# Patient Record
Sex: Female | Born: 1970
Health system: Southern US, Community
[De-identification: ages and names within clinical notes are randomized; demographics above are authoritative.]

## PROBLEM LIST (undated history)

## (undated) DIAGNOSIS — I4819 Other persistent atrial fibrillation: Secondary | ICD-10-CM

## (undated) DIAGNOSIS — Z9889 Other specified postprocedural states: Secondary | ICD-10-CM

## (undated) DIAGNOSIS — I509 Heart failure, unspecified: Secondary | ICD-10-CM

## (undated) DIAGNOSIS — L0390 Cellulitis, unspecified: Secondary | ICD-10-CM

## (undated) DIAGNOSIS — I119 Hypertensive heart disease without heart failure: Secondary | ICD-10-CM

## (undated) DIAGNOSIS — I272 Pulmonary hypertension, unspecified: Secondary | ICD-10-CM

## (undated) DIAGNOSIS — M109 Gout, unspecified: Secondary | ICD-10-CM

## (undated) DIAGNOSIS — I4892 Unspecified atrial flutter: Secondary | ICD-10-CM

## (undated) DIAGNOSIS — N183 Chronic kidney disease, stage 3 unspecified: Secondary | ICD-10-CM

## (undated) DIAGNOSIS — D649 Anemia, unspecified: Secondary | ICD-10-CM

## (undated) DIAGNOSIS — I5032 Chronic diastolic (congestive) heart failure: Secondary | ICD-10-CM

## (undated) DIAGNOSIS — I10 Essential (primary) hypertension: Secondary | ICD-10-CM

## (undated) HISTORY — DX: Heart failure, unspecified: I50.9

## (undated) SURGERY — CARDIOVERSION
Anesthesia: Monitor Anesthesia Care

---

## 2002-12-26 ENCOUNTER — Emergency Department (HOSPITAL_COMMUNITY): Admission: EM | Admit: 2002-12-26 | Discharge: 2002-12-27 | Payer: Self-pay | Admitting: *Deleted

## 2005-01-20 ENCOUNTER — Inpatient Hospital Stay (HOSPITAL_COMMUNITY): Admission: EM | Admit: 2005-01-20 | Discharge: 2005-01-23 | Payer: Self-pay | Admitting: *Deleted

## 2005-01-21 ENCOUNTER — Encounter: Payer: Self-pay | Admitting: Cardiology

## 2007-12-06 ENCOUNTER — Encounter: Payer: Self-pay | Admitting: Orthopedic Surgery

## 2007-12-06 ENCOUNTER — Emergency Department (HOSPITAL_COMMUNITY): Admission: EM | Admit: 2007-12-06 | Discharge: 2007-12-06 | Payer: Self-pay | Admitting: Emergency Medicine

## 2007-12-10 ENCOUNTER — Ambulatory Visit: Payer: Self-pay | Admitting: Orthopedic Surgery

## 2007-12-10 DIAGNOSIS — M7020 Olecranon bursitis, unspecified elbow: Secondary | ICD-10-CM

## 2008-11-19 ENCOUNTER — Emergency Department (HOSPITAL_COMMUNITY): Admission: EM | Admit: 2008-11-19 | Discharge: 2008-11-19 | Payer: Self-pay | Admitting: Emergency Medicine

## 2009-06-06 ENCOUNTER — Ambulatory Visit: Payer: Self-pay | Admitting: Orthopedic Surgery

## 2009-06-06 DIAGNOSIS — M7710 Lateral epicondylitis, unspecified elbow: Secondary | ICD-10-CM | POA: Insufficient documentation

## 2010-01-24 ENCOUNTER — Encounter: Payer: Self-pay | Admitting: Orthopedic Surgery

## 2010-03-30 NOTE — Assessment & Plan Note (Signed)
Summary: NEW PROBLEM LEFT ELBOW PAIN NEEDS XR/MEDICARE/BSF   Visit Type:  new problem  CC:  left elbow pain.  History of Present Illness: 40 years old African American female history of RIGHT elbow problems treated with injection for tendinitis Zentz with LEFT elbow pain, swelling, x1 week.  Seems to have gotten better since she is a been applying ice.  But she did have painful range of motion sharp throbbing burning pain which was a 10 was associated with some numbness and tingling and swelling as stated.  The pain is constant it is worse with motion.  She also took some ibuprofen which seemed to help Xrays today.  Allergies: 1)  ! Penicillin  Past History:  Past Medical History: Last updated: 12/10/2007 htn  Past Surgical History: Last updated: 12/10/2007 NA  Family History: Last updated: 12/10/2007 FH of Cancer:  Family History of Diabetes Family History Coronary Heart Disease female < 94 Hx, family, asthma  Social History: Last updated: 12/10/2007 Patient is single.  LPN  Risk Factors: Caffeine Use: 0 (12/10/2007)  Risk Factors: Smoking Status: never (12/10/2007)  Review of Systems Constitutional:  Denies weight loss, weight gain, fever, chills, and fatigue. Cardiovascular:  Complains of murmurs; denies chest pain, palpitations, and fainting. Respiratory:  Denies short of breath, wheezing, couch, tightness, pain on inspiration, and snoring . Gastrointestinal:  Denies heartburn, nausea, vomiting, diarrhea, constipation, and blood in your stools. Genitourinary:  Denies frequency, urgency, difficulty urinating, painful urination, flank pain, and bleeding in urine. Neurologic:  Complains of numbness and tingling; denies unsteady gait, dizziness, tremors, and seizure. Musculoskeletal:  Complains of joint pain; denies swelling, instability, stiffness, redness, heat, and muscle pain. Endocrine:  Denies excessive thirst, exessive urination, and heat or cold  intolerance. Psychiatric:  Denies nervousness, depression, anxiety, and hallucinations. Skin:  Denies changes in the skin, poor healing, rash, itching, and redness. HEENT:  Denies blurred or double vision, eye pain, redness, and watering. Immunology:  Complains of seasonal allergies; denies sinus problems and allergic to bee stings. Hemoatologic:  Denies easy bleeding and brusing.  Physical Exam  Additional Exam:  Normal appearance, development and grooming.  Good pulse in perfusion to the LEFT upper extremity  There is no lymphadenopathy there.  Skin is intact no rash  She is oriented x3 her mood is pleasant  Her LEFT elbow is tender over the lateral epicondyle and there is some swelling there, there is also some swelling and mild tenderness at the olecranon bursa.  She has decreased range of motion in the elbow with painful flexion 5 loss of extension.  Elbow is stable to medial and lateral stress.  Joint is normal.  There is some tenderness in the forearm as well.  She does not have pain with wrist extension.     Impression & Recommendations:  Problem # 1:  LATERAL EPICONDYLITIS (ICD-726.32) Assessment New  injection LEFT elbow lateral epicondyle The  elbow was prepped with alcohol and anesthetized with ethyl chloride. 40 mg of Depo-Medrol and 5 cc of 1% lidocaine were injected along the lateral epicondyle. No complications. Verbal consent was obtained prior to injection.  Orders: Est. Patient Level IV (11914) Depo- Medrol 40mg  (J1030) Joint Aspirate / Injection, Intermediate (20605) Elbow x-ray, 2 views (78295)  Patient Instructions: 1)  You have received an injection of cortisone today. You may experience increased pain at the injection site. Apply ice pack to the area for 20 minutes every 2 hours and take 2 xtra strength tylenol every 8 hours. This increased  pain will usually resolve in 24 hours. The injection will take effect in 3-10 days.  2)  continue ice until pain  is completely gone  3)  Please schedule a follow-up appointment as needed. 4)  DIAGNOSIS IS TENDONITIS OF THE ELBOW

## 2010-03-30 NOTE — Letter (Signed)
Summary: History form  History form   Imported By: Jacklynn Ganong 06/07/2009 13:15:26  _____________________________________________________________________  External Attachment:    Type:   Image     Comment:   External Document

## 2010-03-30 NOTE — Letter (Signed)
Summary: medical record request   medical record request   Imported By: Jacklynn Ganong 02/08/2010 11:48:05  _____________________________________________________________________  External Attachment:    Type:   Image     Comment:   External Document

## 2010-07-14 NOTE — Discharge Summary (Signed)
NAME:  MARRA, FRAGA NO.:  [id number]   MEDICAL RECORD NO.:  [id number]          PATIENT TYPE:  INP   LOCATION:  3729                         FACILITY:  [facility]   PHYSICIAN:  Madaline Savage, M.D.DATE OF BIRTH:  12/03/1970   DATE OF ADMISSION:  01/21/2005  DATE OF DISCHARGE:  01/23/2005                                 DISCHARGE SUMMARY   Ms.  Desteni Piscopo was transferred from [facility] for further cardiac  evaluation. She is a 40 year old, morbidly obese African-American female who  presented to [facility] secondary to gout.  Her blood pressure was very high  at 263/157.  Heart rate was 157 an in atrial flutter.  She was admitted to  ICU on an IV Cardizem drip.  She had no chest pain, no shortness of breath.  Her troponin was 0.15.  She apparently was changed from Cardizem to  labetalol and then transferred to [facility] for further evaluation.  She  was seen by Dr. [name] who thought she should undergo cardiac  catheterization, thought her PAF was probably secondary to her elevated  blood pressure, and thought her blood pressure was so elevated secondary to  her right great toe pain.  Because of her other cardiac risk factors, it was  decided she should undergo cardiac catheterization.  On January 22, 2005,  she had a cardiac catheterization by Dr.  [name].  She had giant coronaries,  no obstructive disease.  She had a diffuse slow flow.  He decided on medical  therapy.  Her EF was 50-60%.  She did have LVH.  She had a vascular closure  device used.  On January 23, 2005, she was considered stable for discharge  home.   LABORATORY DATA:  Hemoglobin 12, hematocrit 35, platelets 215,200, WBC 8.6.  Sodium was 142, potassium 3.9, BUN 16, and creatinine 1.8.  CK-MB at [facility] showed just one was done, 115/4, with a troponin of 0.1.  Urine  pregnancy was negative.  Glycosylated hemoglobin was 5.6.  Her chest x-ray  showed the heart was prominent, no active  disease.   DISCHARGE MEDICATIONS:  1.  Labetalol 200 mg two times a day.  2.  Benicar 40 mg one time per day.  3.  Aspirin 81 mg one time per day.  4.  Lasix 40 mg one time per day.  5.  K-Dur 20 mEq one time per day.   ACTIVITY:  She is to do not strenuous activity x5 days, no sitting in a tube  of water for seven days.  She can shower the day after the discharge.   FOLLOW UP:  She will need to be seen by Dr. [name] in 2-3 weeks.  She should  have her blood drawn before she sees Dr. [name].  It should be a fasting  lipid profile.   DISCHARGE DIAGNOSES:  1.  Hypertension.  2.  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  3.  Normal coronary arteries by catheterization.  4.  Ejection fraction of 50-60%.  5.  Premature family history  of heart disease.  6.  Morbid obesity.  7.  Gout.      Lezlie Octave, N.P.    ______________________________  Madaline Savage, M.D.    BB/MEDQ  D:  03/11/2005  T:  03/12/2005  Job:  [id number]   cc:   Corrie Mckusick, M.D.  Fax: [phone]

## 2010-07-14 NOTE — H&P (Signed)
[name], [name]               ACCOUNT NO.:  [id number]   MEDICAL RECORD NO.:  [id number]          PATIENT TYPE:  INP   LOCATION:  IC09                          FACILITY:  [facility]   PHYSICIAN:  Corrie Mckusick, M.D.  DATE OF BIRTH:  [birth year]   DATE OF ADMISSION:  01/19/2005  DATE OF DISCHARGE:  LH                                HISTORY & PHYSICAL   ADMISSION DIAGNOSES:  1.  New-onset atrial fibrillation.  2.  Significant hypertension.   HISTORY OF PRESENT ILLNESS:  A 40 year old female with just a history of  hypertension and gout who was out shopping all day and came into the  emergency department because her toe hurt.  She came in with no other  symptomatology from a cardiovascular/respiratory standpoint whatsoever.  When she came into the emergency department, she was found to have blood  pressures with peak of 263/157.  She was also found to have a pulse in the  150s and in atrial flutter.  She was absolutely asymptomatic with her  systolics in the 200s as well as her diastolics in the 150s-170s.  She was  given Cardizem bolus which actually improved the heart rate as well as the  blood pressure quite nicely.  Dr. Mosetta Putt talked to me about sending the  patient home and I decided that she needed to come into the intensive care  unit on a Cardizem drip to get blood pressure controlled as well as to have  a proper rule out protocol.  She again had no symptomatology with this, no  associated chest pain, shortness of breath, nausea, vomiting or diaphoresis.   As far as I can tell, she has been compliant with her medications.  She has  been on various antihypertensives in the past, Norvasc working the best, but  did cause edema in the feet.  She has also been on Verapamil in the past.  She has been on Benicar 40 mg now for what she said for about 6 months and  she thought she was well-controlled, although she has not been checking her  blood pressure at home.  Review of systems is  otherwise negative.   PAST MEDICAL HISTORY:  1.  Hypertension.  2.  Gout.   PAST SURGICAL HISTORY:  None.   MEDICATIONS:  1.  Benicar 40 mg daily.  2.  Lasix 40 mg p.o. q.a.m. p.r.n.   ALLERGIES:  PENICILLIN.   SOCIAL HISTORY:  Lives with various family members.  Does not drink or  smoke.  Denies any illicit drug use whatsoever.   FAMILY HISTORY:  Significant for hypertension.  No other significant family  history.  No early heart disease in the family.   PHYSICAL EXAMINATION:  VITAL SIGNS:  When I saw the patient, BP 170/90,  heart rate 90s, respirations 20, O2 saturations 99% on room air.  GENERAL:  When I saw the patient, she was pleasant, talkative, sitting up  and eating fruit loops and joking.  HEENT:  Nasopharynx and oropharynx are clear with moist mucous membranes.  NECK:  Supple with no lymphadenopathy and thick, but no  JVD.  CHEST:  Clear to auscultation bilaterally.  CARDIAC:  Irregularly, irregular rhythm.  There is a normal S1, S2.  No  murmur.  ABDOMEN:  Soft, nontender, nondistended.  EXTREMITIES:  No edema.  NEUROLOGIC:  Pupils equal round and reactive to light.  Extraocular  movements intact.  Cranial nerves 2-12 grossly intact.  Completely nonfocal  exam with strength 5/5 throughout, sensation intact.  Reflexes +2  throughout.   LABORATORY DATA AND X-RAY FINDINGS:  Point of care makers reportedly  negative in the ER.  Drug screen positive for opiates only.  Urinalysis  clear.  Sodium 131, potassium 3.1, chloride 103, bicarb 28, glucose 122, BUN  21, creatinine 1.5.  CBC unremarkable.   ASSESSMENT:  A 40 year old with new-onset atrial flutter and dramatic  hypertension.   PLAN:  1.  Admit to the ICU on Cardizem drip to control rate as well as blood      pressure.  2.  Will most likely restart calcium channel blocker in addition to her ARB.  3.  Rule out protocol, CPK-MB, troponin q.8h. x3.  4.  Will consult cardiology for further opinion to discuss  whether we need      to heparinize.  5.  Echocardiogram.  6.  Will check renal artery stenosis.  7.  Will continue to follow closely.  No CT scan of the head was done as she      is completely neurologically intact and is asymptomatic from that      standpoint.      Corrie Mckusick, M.D.  Electronically Signed     JCG/MEDQ  D:  01/20/2005  T:  01/20/2005  Job:  [id number]

## 2010-07-14 NOTE — Cardiovascular Report (Signed)
[name], [name] NO.:  [id number]   MEDICAL RECORD NO.:  [id number]          PATIENT TYPE:  INP   LOCATION:  3729                         FACILITY:  [facility]   PHYSICIAN:  Madaline Savage, M.D.DATE OF BIRTH:  [birth year]   DATE OF PROCEDURE:  01/22/2005  DATE OF DISCHARGE:                              CARDIAC CATHETERIZATION   PROCEDURES PERFORMED:  1.  Selective coronary angiography by Judkins technique.  2.  Retrograde left heart catheterization.  3.  Left ventricular angiography.   COMPLICATIONS:  None.   ENTRY SITE:  Right femoral.   DYE USED:  Omnipaque.   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 She was admitted to  the hospital and at some point and time during the hospitalization was found  to be in atrial flutter transiently. It was her attending physician Dr.  Sharyon Medicus concern that she may have cardiac disease based on her high blood  pressure, her morbid obesity and her family history with a mother who has  already undergone coronary artery bypass grafting. The patient was  transferred to [facility] this past weekend for further evaluation  and today we are performing cardiac catheterization electively.   RESULTS:  Left ventricular pressure was 175/20 with end-diastolic pressure  40. Central aortic pressure 175/106, mean of 135. No aortic valve gradient  by pullback technique.   ANGIOGRAPHIC RESULTS:  The patient's left main coronary artery was  relatively short and the best way to describe her coronary arteries is that  they are giant arteries. I would estimate their diameter to be somewhere  between 5 to 10 millimeters in diameter. The left main coronary artery is  short. It is smooth and it is  normal.   The LAD courses to the cardiac apex, wraps around the apex and has a 30%  eccentric lesion over the course of approximately 15 mm there. The diagonal  branch arising near the septal perforator branch appears normal except for  large diameter and slow sluggish flow.   Left circumflex coronary artery gives rise to two obtuse marginal branches  and terminates as a posterolateral branch which is tortuous. There is one  major atrial circumflex branch seen. The circumflex vessels show diffuse  irregularity but no high-grade obstructive lesions.   Right coronary artery contains diffuse luminal irregularities but no  obstructive lesions. It terminates as a trifurcation into a posterior  descending which is small and two posterolateral branches. There is some  stenosis at the origin of a pulmonary conus branch approximately 50% at the  ostium of this vessel as it comes off the RCA.   The left ventricular angiogram shows the ventricle to be dilated, mildly  hypokinesia in the anterolateral wall. I would estimate ejection fraction of  50%. There is marked apical hypertrophy of the ventricle.   Abdominal aortography was done due  to the patient's marked hypertension.  Right renal artery was single and normal. Left renal artery was a dual  arterial system; both of which were normal. There was no abdominal aorta  pathology.   AngioSeal was performed on the right femoral artery with good results and  the patient was transferred from the cath lab to the holding area.   FINAL DIAGNOSES:  1.  Angiographically patent coronary artery with sluggish flow and marked      ectasia due to left ventricular hypertrophy.  2.  Normal renal arteries.  3.  Well-preserved left ventricular systolic function, ejection fraction 50-      60%.   PLAN:  Continued therapy of the patient's systemic hypertension and we will  continue to encourage weight loss as part of the patient's therapy. Current  height is  511. Current weight is 369 pounds.           ______________________________  Madaline Savage, M.D.     WHG/MEDQ  D:  01/22/2005  T:  01/23/2005  Job:  [id number]   cc:   Madelin Rear. Sherwood Gambler, MD  Fax: [phone]   [facility] Medical Records   [facility] Catheter Lab

## 2010-12-12 ENCOUNTER — Encounter: Payer: Self-pay | Admitting: Orthopedic Surgery

## 2010-12-12 ENCOUNTER — Ambulatory Visit (INDEPENDENT_AMBULATORY_CARE_PROVIDER_SITE_OTHER): Payer: Medicare Other | Admitting: Orthopedic Surgery

## 2010-12-12 VITALS — BP 230/130 | Ht 69.0 in | Wt 384.0 lb

## 2010-12-12 DIAGNOSIS — M25519 Pain in unspecified shoulder: Secondary | ICD-10-CM

## 2010-12-12 DIAGNOSIS — M10029 Idiopathic gout, unspecified elbow: Secondary | ICD-10-CM

## 2010-12-12 DIAGNOSIS — M6530 Trigger finger, unspecified finger: Secondary | ICD-10-CM

## 2010-12-12 NOTE — Patient Instructions (Signed)
You have received a steroid shot. 15% of patients experience increased pain at the injection site with in the next 24 hours. This is best treated with ice and tylenol extra strength 2 tabs every 8 hours. If you are still having pain please call the office.   You have been scheduled for surgery  Please Go to your preoperative appointment and bring the folder that was given to you today  Please stop all blood thinners ibuprofen Naprosyn aspirin Plavix Coumadin   You have been scheduled for surgery.  All surgeries carry some risk.  Remember you always have the option of continued nonsurgical treatment. However in this situation the risks vs. the benefits favor surgery as the best treatment option. The risks of the surgery includes the following but is not limited to bleeding, infection, pulmonary embolus, death from anesthesia, nerve injury vascular injury or need for further surgery, continued pain.  Specific to this procedure the following risks and complications are rare but possible Infection and recurrence

## 2010-12-12 NOTE — Progress Notes (Signed)
Chief complaint #1 RIGHT elbow mass and pain #2 RIGHT shoulder pain resolved spontaneously #3 RIGHT index finger catching and locking   This is a 40 year old female with history of gout presents with bilateral masses over her elbows which are painful, a RIGHT index finger which is catching, and RIGHT shoulder pain which has now gone away by itself  She complains of catching and locking and pain in the A1 pulley site of the RIGHT index finger and complains of pain in the RIGHT elbow constant nonradiating localized over the olecranon with a large mass which is been present for years and appears to be getting bigger.  The LEFT one hurts only occasionally     Examination  General appearance she is obviously obese.  Grooming and hygiene are normal  Radial and ulnar pulses normal color normal  Lymph nodes negative epitrochlear and axilla  Skin warm dry and intact no rash  Sensation normal reflexes normal.  The patient is awake and alert and oriented x 3  Ambulation is normal  RIGHT shoulder full range of motion normal strength no instability no tenderness  RIGHT index finger tenderness over the A1 pulley catching and locking on range of motion which was normal.  Flexion power normal.  Finger joints stable.  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  Strength instability of elbow normal.  The area is tender  Separate x-ray report [facility] lateral RIGHT elbow.  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  Verbal consent  Time out.  Alcohol prep.  Ethyl chloride anesthesia.  Medication: Depo-Medrol 40 mg, lidocaine 1% 3  cc  After prep with alcohol and anesthesia with ethyl chloride one to one injection of above medication was placed at the A1 pulley.

## 2010-12-18 ENCOUNTER — Encounter (HOSPITAL_COMMUNITY): Admission: RE | Admit: 2010-12-18 | Payer: Medicare Other | Source: Ambulatory Visit

## 2010-12-18 ENCOUNTER — Telehealth: Payer: Self-pay | Admitting: Orthopedic Surgery

## 2010-12-18 NOTE — Telephone Encounter (Signed)
[name] (10/07/2070) wants to reschedule her surgery that is scheduled  For 12/22/10. Her Phone # [phone]

## 2010-12-19 ENCOUNTER — Telehealth: Payer: Self-pay | Admitting: Orthopedic Surgery

## 2010-12-19 NOTE — Telephone Encounter (Signed)
Patient is asking if Dr. Romeo Apple can phone in prescription for pain, swelling until she has her surgery. Per previous phone note, she is needing to re-schedule her 12/22/10 surgery to possibly 01/05/11.  Her pharmacy is [facility] in [place].  Her ph# is [phone].

## 2010-12-19 NOTE — Telephone Encounter (Signed)
[name]  Called back and would like to reschedule her surgery for Friday 01/05/11 if possible. Will she need another appointment here?  Please advise

## 2010-12-22 ENCOUNTER — Ambulatory Visit (HOSPITAL_COMMUNITY): Admission: RE | Admit: 2010-12-22 | Payer: Medicare Other | Source: Ambulatory Visit | Admitting: Orthopedic Surgery

## 2010-12-22 ENCOUNTER — Encounter (HOSPITAL_COMMUNITY): Admission: RE | Payer: Self-pay | Source: Ambulatory Visit

## 2010-12-22 SURGERY — EXCISION MASS
Anesthesia: General | Laterality: Right

## 2010-12-25 ENCOUNTER — Ambulatory Visit: Payer: Medicare Other | Admitting: Orthopedic Surgery

## 2010-12-25 ENCOUNTER — Other Ambulatory Visit: Payer: Self-pay | Admitting: Orthopedic Surgery

## 2010-12-25 DIAGNOSIS — M25529 Pain in unspecified elbow: Secondary | ICD-10-CM

## 2010-12-25 MED ORDER — IBUPROFEN 800 MG PO TABS: 800.0000 mg | ORAL_TABLET | Freq: Three times a day (TID) | ORAL | Status: AC | PRN

## 2011-12-10 ENCOUNTER — Encounter (HOSPITAL_COMMUNITY): Payer: Self-pay

## 2011-12-10 ENCOUNTER — Emergency Department (HOSPITAL_COMMUNITY): Payer: Medicare Other

## 2011-12-10 ENCOUNTER — Inpatient Hospital Stay (HOSPITAL_COMMUNITY)
Admission: EM | Admit: 2011-12-10 | Discharge: 2012-01-04 | DRG: 287 | Disposition: A | Discharge status: Other Acute Inpatient Hospital | Payer: Medicare Other | Attending: Internal Medicine | Admitting: Internal Medicine

## 2011-12-10 DIAGNOSIS — R0602 Shortness of breath: Secondary | ICD-10-CM | POA: Diagnosis not present

## 2011-12-10 DIAGNOSIS — M109 Gout, unspecified: Secondary | ICD-10-CM | POA: Diagnosis not present

## 2011-12-10 DIAGNOSIS — M7020 Olecranon bursitis, unspecified elbow: Secondary | ICD-10-CM

## 2011-12-10 DIAGNOSIS — M10029 Idiopathic gout, unspecified elbow: Secondary | ICD-10-CM

## 2011-12-10 DIAGNOSIS — I319 Disease of pericardium, unspecified: Secondary | ICD-10-CM | POA: Diagnosis present

## 2011-12-10 DIAGNOSIS — E876 Hypokalemia: Secondary | ICD-10-CM | POA: Diagnosis present

## 2011-12-10 DIAGNOSIS — Z9119 Patient's noncompliance with other medical treatment and regimen: Secondary | ICD-10-CM | POA: Diagnosis not present

## 2011-12-10 DIAGNOSIS — E8809 Other disorders of plasma-protein metabolism, not elsewhere classified: Secondary | ICD-10-CM | POA: Diagnosis present

## 2011-12-10 DIAGNOSIS — M25519 Pain in unspecified shoulder: Secondary | ICD-10-CM

## 2011-12-10 DIAGNOSIS — R509 Fever, unspecified: Secondary | ICD-10-CM | POA: Diagnosis not present

## 2011-12-10 DIAGNOSIS — M7710 Lateral epicondylitis, unspecified elbow: Secondary | ICD-10-CM

## 2011-12-10 DIAGNOSIS — I4891 Unspecified atrial fibrillation: Secondary | ICD-10-CM | POA: Diagnosis present

## 2011-12-10 DIAGNOSIS — E859 Amyloidosis, unspecified: Secondary | ICD-10-CM | POA: Diagnosis not present

## 2011-12-10 DIAGNOSIS — D649 Anemia, unspecified: Secondary | ICD-10-CM | POA: Diagnosis not present

## 2011-12-10 DIAGNOSIS — M7989 Other specified soft tissue disorders: Secondary | ICD-10-CM | POA: Diagnosis not present

## 2011-12-10 DIAGNOSIS — Z7982 Long term (current) use of aspirin: Secondary | ICD-10-CM

## 2011-12-10 DIAGNOSIS — R7989 Other specified abnormal findings of blood chemistry: Secondary | ICD-10-CM

## 2011-12-10 DIAGNOSIS — R601 Generalized edema: Secondary | ICD-10-CM | POA: Diagnosis present

## 2011-12-10 DIAGNOSIS — I10 Essential (primary) hypertension: Secondary | ICD-10-CM | POA: Diagnosis present

## 2011-12-10 DIAGNOSIS — R609 Edema, unspecified: Secondary | ICD-10-CM | POA: Diagnosis present

## 2011-12-10 DIAGNOSIS — M25529 Pain in unspecified elbow: Secondary | ICD-10-CM

## 2011-12-10 DIAGNOSIS — I279 Pulmonary heart disease, unspecified: Secondary | ICD-10-CM | POA: Diagnosis present

## 2011-12-10 DIAGNOSIS — Z8673 Personal history of transient ischemic attack (TIA), and cerebral infarction without residual deficits: Secondary | ICD-10-CM

## 2011-12-10 DIAGNOSIS — D72829 Elevated white blood cell count, unspecified: Secondary | ICD-10-CM | POA: Diagnosis not present

## 2011-12-10 DIAGNOSIS — I509 Heart failure, unspecified: Secondary | ICD-10-CM | POA: Diagnosis not present

## 2011-12-10 DIAGNOSIS — L03119 Cellulitis of unspecified part of limb: Secondary | ICD-10-CM | POA: Diagnosis present

## 2011-12-10 DIAGNOSIS — R799 Abnormal finding of blood chemistry, unspecified: Secondary | ICD-10-CM | POA: Diagnosis not present

## 2011-12-10 DIAGNOSIS — N179 Acute kidney failure, unspecified: Secondary | ICD-10-CM | POA: Diagnosis not present

## 2011-12-10 DIAGNOSIS — R17 Unspecified jaundice: Secondary | ICD-10-CM | POA: Diagnosis not present

## 2011-12-10 DIAGNOSIS — Z6841 Body Mass Index (BMI) 40.0 and over, adult: Secondary | ICD-10-CM

## 2011-12-10 DIAGNOSIS — Z23 Encounter for immunization: Secondary | ICD-10-CM | POA: Diagnosis not present

## 2011-12-10 DIAGNOSIS — I872 Venous insufficiency (chronic) (peripheral): Secondary | ICD-10-CM | POA: Diagnosis present

## 2011-12-10 DIAGNOSIS — Z09 Encounter for follow-up examination after completed treatment for conditions other than malignant neoplasm: Secondary | ICD-10-CM | POA: Diagnosis not present

## 2011-12-10 DIAGNOSIS — M6530 Trigger finger, unspecified finger: Secondary | ICD-10-CM

## 2011-12-10 DIAGNOSIS — Z86718 Personal history of other venous thrombosis and embolism: Secondary | ICD-10-CM

## 2011-12-10 DIAGNOSIS — I517 Cardiomegaly: Secondary | ICD-10-CM

## 2011-12-10 DIAGNOSIS — D7589 Other specified diseases of blood and blood-forming organs: Secondary | ICD-10-CM | POA: Diagnosis not present

## 2011-12-10 DIAGNOSIS — D235 Other benign neoplasm of skin of trunk: Secondary | ICD-10-CM | POA: Diagnosis not present

## 2011-12-10 DIAGNOSIS — L02419 Cutaneous abscess of limb, unspecified: Secondary | ICD-10-CM | POA: Diagnosis present

## 2011-12-10 DIAGNOSIS — I428 Other cardiomyopathies: Secondary | ICD-10-CM | POA: Diagnosis present

## 2011-12-10 DIAGNOSIS — I2789 Other specified pulmonary heart diseases: Secondary | ICD-10-CM | POA: Diagnosis not present

## 2011-12-10 DIAGNOSIS — I5033 Acute on chronic diastolic (congestive) heart failure: Principal | ICD-10-CM

## 2011-12-10 DIAGNOSIS — N183 Chronic kidney disease, stage 3 unspecified: Secondary | ICD-10-CM | POA: Diagnosis present

## 2011-12-10 DIAGNOSIS — Z91199 Patient's noncompliance with other medical treatment and regimen due to unspecified reason: Secondary | ICD-10-CM

## 2011-12-10 DIAGNOSIS — R5381 Other malaise: Secondary | ICD-10-CM | POA: Diagnosis present

## 2011-12-10 DIAGNOSIS — I129 Hypertensive chronic kidney disease with stage 1 through stage 4 chronic kidney disease, or unspecified chronic kidney disease: Secondary | ICD-10-CM | POA: Diagnosis present

## 2011-12-10 DIAGNOSIS — R079 Chest pain, unspecified: Secondary | ICD-10-CM | POA: Diagnosis not present

## 2011-12-10 DIAGNOSIS — R160 Hepatomegaly, not elsewhere classified: Secondary | ICD-10-CM | POA: Diagnosis not present

## 2011-12-10 DIAGNOSIS — I50810 Right heart failure, unspecified: Secondary | ICD-10-CM

## 2011-12-10 DIAGNOSIS — I4892 Unspecified atrial flutter: Secondary | ICD-10-CM | POA: Diagnosis not present

## 2011-12-10 DIAGNOSIS — R161 Splenomegaly, not elsewhere classified: Secondary | ICD-10-CM | POA: Diagnosis not present

## 2011-12-10 DIAGNOSIS — I429 Cardiomyopathy, unspecified: Secondary | ICD-10-CM

## 2011-12-10 DIAGNOSIS — I50811 Acute right heart failure: Secondary | ICD-10-CM

## 2011-12-10 DIAGNOSIS — I5023 Acute on chronic systolic (congestive) heart failure: Secondary | ICD-10-CM | POA: Diagnosis not present

## 2011-12-10 DIAGNOSIS — M79609 Pain in unspecified limb: Secondary | ICD-10-CM | POA: Diagnosis not present

## 2011-12-10 DIAGNOSIS — I272 Pulmonary hypertension, unspecified: Secondary | ICD-10-CM

## 2011-12-10 DIAGNOSIS — D696 Thrombocytopenia, unspecified: Secondary | ICD-10-CM | POA: Diagnosis not present

## 2011-12-10 DIAGNOSIS — L03116 Cellulitis of left lower limb: Secondary | ICD-10-CM | POA: Diagnosis present

## 2011-12-10 HISTORY — DX: Chronic kidney disease, stage 3 (moderate): N18.3

## 2011-12-10 HISTORY — DX: Gout, unspecified: M10.9

## 2011-12-10 HISTORY — DX: Unspecified atrial flutter: I48.92

## 2011-12-10 HISTORY — DX: Essential (primary) hypertension: I10

## 2011-12-10 HISTORY — DX: Other specified postprocedural states: Z98.890

## 2011-12-10 HISTORY — DX: Hypertensive heart disease without heart failure: I11.9

## 2011-12-10 HISTORY — DX: Chronic kidney disease, stage 3 unspecified: N18.30

## 2011-12-10 HISTORY — DX: Morbid (severe) obesity due to excess calories: E66.01

## 2011-12-10 LAB — CBC WITH DIFFERENTIAL/PLATELET
Basophils Absolute: 0.1 10*3/uL (ref 0.0–0.1)
Basophils Relative: 1 % (ref 0–1)
Eosinophils Absolute: 0.3 10*3/uL (ref 0.0–0.7)
Eosinophils Relative: 3 % (ref 0–5)
HCT: 35.1 % — ABNORMAL LOW (ref 36.0–46.0)
Hemoglobin: 11.3 g/dL — ABNORMAL LOW (ref 12.0–15.0)
Lymphocytes Relative: 13 % (ref 12–46)
Lymphs Abs: 1.5 10*3/uL (ref 0.7–4.0)
MCH: 29.4 pg (ref 26.0–34.0)
MCHC: 32.2 g/dL (ref 30.0–36.0)
MCV: 91.2 fL (ref 78.0–100.0)
Monocytes Absolute: 0.6 10*3/uL (ref 0.1–1.0)
Monocytes Relative: 6 % (ref 3–12)
Neutro Abs: 7.9 10*3/uL — ABNORMAL HIGH (ref 1.7–7.7)
Neutrophils Relative %: 77 % (ref 43–77)
Platelets: 298 10*3/uL (ref 150–400)
RBC: 3.85 MIL/uL — ABNORMAL LOW (ref 3.87–5.11)
RDW: 16.4 % — ABNORMAL HIGH (ref 11.5–15.5)
WBC: 10.3 10*3/uL (ref 4.0–10.5)

## 2011-12-10 LAB — COMPREHENSIVE METABOLIC PANEL
ALT: 6 U/L (ref 0–35)
AST: 16 U/L (ref 0–37)
Albumin: 3.3 g/dL — ABNORMAL LOW (ref 3.5–5.2)
Alkaline Phosphatase: 114 U/L (ref 39–117)
BUN: 20 mg/dL (ref 6–23)
CO2: 22 mEq/L (ref 19–32)
Calcium: 9.5 mg/dL (ref 8.4–10.5)
Chloride: 101 mEq/L (ref 96–112)
Creatinine, Ser: 1.48 mg/dL — ABNORMAL HIGH (ref 0.50–1.10)
GFR calc Af Amer: 51 mL/min — ABNORMAL LOW (ref >90)
GFR calc non Af Amer: 44 mL/min — ABNORMAL LOW (ref >90)
Glucose, Bld: 92 mg/dL (ref 70–99)
Potassium: 3.7 mEq/L (ref 3.5–5.1)
Sodium: 138 mEq/L (ref 135–145)
Total Bilirubin: 2.5 mg/dL — ABNORMAL HIGH (ref 0.3–1.2)
Total Protein: 8.7 g/dL — ABNORMAL HIGH (ref 6.0–8.3)

## 2011-12-10 LAB — TROPONIN I: Troponin I: <0.3 ng/mL (ref <0.30)

## 2011-12-10 MED ORDER — LABETALOL HCL 5 MG/ML IV SOLN
40.0000 mg | Freq: Once | INTRAVENOUS | Status: AC
  Administered 2011-12-10: 40 mg via INTRAVENOUS
  Filled 2011-12-10: qty 8

## 2011-12-10 MED ORDER — FUROSEMIDE 10 MG/ML IJ SOLN
40.0000 mg | Freq: Once | INTRAMUSCULAR | Status: AC
  Administered 2011-12-10: 40 mg via INTRAVENOUS
  Filled 2011-12-10: qty 4

## 2011-12-10 NOTE — ED Provider Notes (Signed)
History   This chart was scribed for Beverly Lennert, MD by [name]. The patient was seen in room APA04/APA04 and the patient's care was started at 8:56PM.    CSN: [id number]  Arrival date & time 12/10/11  2008   First MD Initiated Contact with Patient 12/10/11 2056      Chief Complaint  Patient presents with  . Leg Swelling  . Joint Pain    (Consider location/radiation/quality/duration/timing/severity/associated sxs/prior treatment) Patient is a 41 y.o. female presenting with knee pain and leg pain. The history is provided by the patient. No language interpreter was used.  Knee Pain This is a chronic problem. The current episode started more than 1 week ago. The problem occurs constantly. The problem has been gradually worsening. Associated symptoms include shortness of breath. Pertinent negatives include no chest pain, no abdominal pain and no headaches. The symptoms are aggravated by walking. Nothing relieves the symptoms. She has tried nothing for the symptoms. The treatment provided no relief.  Leg Pain  The incident occurred more than 1 week ago. The incident occurred at home. There was no injury mechanism. The pain is present in the left leg and right leg. The pain is moderate. The pain has been constant since onset. She reports no foreign bodies present. The symptoms are aggravated by bearing weight. She has tried nothing for the symptoms. The treatment provided no relief.    PCP is Economist.   Past Medical History  Diagnosis Date  . Hypertension   . Heart murmur   . Atrial flutter   . Gout     History reviewed. No pertinent past surgical history.  No family history on file.  History  Substance Use Topics  . Smoking status: Never Smoker   . Smokeless tobacco: Not on file  . Alcohol Use: No    OB History    Grav Para Term Preterm Abortions TAB SAB Ect Mult Living                  Review of Systems  Respiratory: Positive for shortness of breath.     Cardiovascular: Negative for chest pain.  Gastrointestinal: Negative for abdominal pain.  Neurological: Negative for headaches.  All other systems reviewed and are negative.    Allergies  Penicillins  Home Medications   Current Outpatient Rx  Name Route Sig Dispense Refill  . ASPIRIN 81 MG PO TABS Oral Take 81 mg by mouth daily.      . IBUPROFEN 200 MG PO TABS Oral Take 400 mg by mouth daily as needed. For pain    . LABETALOL HCL 200 MG PO TABS Oral Take 200 mg by mouth 2 (two) times daily.        BP 191/117  Pulse 100  Temp 98.4 F (36.9 C) (Oral)  Resp 20  Ht 5\' 11"  (1.803 m)  Wt 360 lb (163.295 kg)  BMI 50.21 kg/m2  SpO2 100%  LMP 12/01/2011  Physical Exam  Nursing note and vitals reviewed. Constitutional: She is oriented to person, place, and time. She appears well-developed.  HENT:  Head: Normocephalic and atraumatic.  Eyes: Conjunctivae normal and EOM are normal. No scleral icterus.  Neck: Neck supple. No thyromegaly present.  Cardiovascular: An irregular rhythm present. Tachycardia present.  Exam reveals no gallop and no friction rub.   No murmur heard. Pulmonary/Chest: No stridor. She has no wheezes. She has no rales. She exhibits no tenderness.  Abdominal: She exhibits no distension. There is no tenderness.  There is no rebound.       Edema in abdomen 4 inches above navel.   Musculoskeletal: Normal range of motion. She exhibits edema (3 + in legs bilaterally).  Lymphadenopathy:    She has no cervical adenopathy.  Neurological: She is oriented to person, place, and time. Coordination normal.  Skin: No rash noted. No erythema.  Psychiatric: She has a normal mood and affect. Her behavior is normal.    ED Course  Procedures (including critical care time)  DIAGNOSTIC STUDIES: Oxygen Saturation is 100% on room air, normal by my interpretation.    COORDINATION OF CARE:    9:06PM- Treatment plan concerning removal of fluid and hospital admission discussed  with patient. Pt agrees with treatment.   10:39PM- Recheck. Treatment plan concerning hospital admission discussed with patient. Pt agrees with treatment.   Results for orders placed during the hospital encounter of 12/10/11  CBC WITH DIFFERENTIAL      Component Value Range   WBC 10.3  4.0 - 10.5 K/uL   RBC 3.85 (*) 3.87 - 5.11 MIL/uL   Hemoglobin 11.3 (*) 12.0 - 15.0 g/dL   HCT 11.9 (*) 14.7 - 82.9 %   MCV 91.2  78.0 - 100.0 fL   MCH 29.4  26.0 - 34.0 pg   MCHC 32.2  30.0 - 36.0 g/dL   RDW 56.2 (*) 13.0 - 86.5 %   Platelets 298  150 - 400 K/uL   Neutrophils Relative 77  43 - 77 %   Neutro Abs 7.9 (*) 1.7 - 7.7 K/uL   Lymphocytes Relative 13  12 - 46 %   Lymphs Abs 1.5  0.7 - 4.0 K/uL   Monocytes Relative 6  3 - 12 %   Monocytes Absolute 0.6  0.1 - 1.0 K/uL   Eosinophils Relative 3  0 - 5 %   Eosinophils Absolute 0.3  0.0 - 0.7 K/uL   Basophils Relative 1  0 - 1 %   Basophils Absolute 0.1  0.0 - 0.1 K/uL  COMPREHENSIVE METABOLIC PANEL      Component Value Range   Sodium 138  135 - 145 mEq/L   Potassium 3.7  3.5 - 5.1 mEq/L   Chloride 101  96 - 112 mEq/L   CO2 22  19 - 32 mEq/L   Glucose, Bld 92  70 - 99 mg/dL   BUN 20  6 - 23 mg/dL   Creatinine, Ser 7.84 (*) 0.50 - 1.10 mg/dL   Calcium 9.5  8.4 - 69.6 mg/dL   Total Protein 8.7 (*) 6.0 - 8.3 g/dL   Albumin 3.3 (*) 3.5 - 5.2 g/dL   AST 16  0 - 37 U/L   ALT 6  0 - 35 U/L   Alkaline Phosphatase 114  39 - 117 U/L   Total Bilirubin 2.5 (*) 0.3 - 1.2 mg/dL   GFR calc non Af Amer 44 (*) >90 mL/min   GFR calc Af Amer 51 (*) >90 mL/min  TROPONIN I      Component Value Range   Troponin I <0.30  <0.30 ng/mL   Dg Chest Portable 1 View  12/10/2011  *RADIOLOGY REPORT*  Clinical Data: Chest pain and shortness of breath.  Bilateral lower extremity swelling.  PORTABLE CHEST - 1 VIEW  Comparison: 01/21/2005  Findings: Shallow inspiration.  Cardiac enlargement.  Pulmonary vascularity is normal.  No focal airspace consolidation in the  lungs.  No blunting of costophrenic angles.  No pneumothorax. Mediastinal contours appear intact.  Right paratracheal fullness is likely vascular.  No significant change since previous study.  IMPRESSION: Cardiac enlargement without pulmonary vascular congestion.  Lungs clear.   Original Report Authenticated By: Marlon Pel, M.D.       No diagnosis found.  Date: 12/10/2011  Rate:109  Rhythm: atrial fibrillation  QRS Axis: normal  Intervals: normal  ST/T Wave abnormalities: normal  Conduction Disutrbances:none  Narrative Interpretation:   Old EKG Reviewed: none available   CRITICAL CARE Performed by: Nemiah Kissner L   Total critical care time: 40  Critical care time was exclusive of separately billable procedures and treating other patients.  Critical care was necessary to treat or prevent imminent or life-threatening deterioration.  Critical care was time spent personally by me on the following activities: development of treatment plan with patient and/or surrogate as well as nursing, discussions with consultants, evaluation of patient's response to treatment, examination of patient, obtaining history from patient or surrogate, ordering and performing treatments and interventions, ordering and review of laboratory studies, ordering and review of radiographic studies, pulse oximetry and re-evaluation of patient's condition.   MDM       The chart was scribed for me under my direct supervision.  I personally performed the history, physical, and medical decision making and all procedures in the evaluation of this patient.Beverly Lennert, MD 12/10/11 [phone]

## 2011-12-10 NOTE — ED Notes (Signed)
Legs are swelling per pt. My elbows and knees hurt per pt. This has been going on for a while per pt. Some days are better than others.

## 2011-12-10 NOTE — H&P (Signed)
[name] is an 41 y.o. female.    PCP: [name]., MD   She has seen Dr. Elsie [name], with [facility] and vascular in the past. However, she would like to see, one of the [facility] doctors.  Chief Complaint: Swelling in the legs for the past many weeks.  HPI: This is a 41 year old, African American female, who is morbidly obese, who has a past history of A. fib/flutter, history of uncontrolled, hypertension, gout. She was last admitted for her cardiac issues back in 2006. She had to be transferred to [facility] because of atrial flutter. She underwent a cardiac catheterization at that time. Coronaries were clear and her EF was about 50-60%. LVH was noted. She appears to be noncompliant with her medication regimen. She hasn't had followup with her primary care physician in over a year. She says her blood pressures not very well controlled. She comes in because of progressive swelling of her legs. The swelling started in the legs on both sides and then has been progressing upwards. Abdomen is also swollen. She denies any chest pain. Denies any shortness of breath. There is no symptoms suggestive of orthopnea or PND. She always has used 2 pillows to sleep at night. Denies any dizziness or lightheadedness. Denies any decreased amount of urination recently. She denies any fever, chills, nausea, vomiting. She has been feeling tired lately and has been having some joint pains in her shoulder, elbow and knee joints. Since the symptoms were not getting any better she decided to come in to the hospital. She obviously has gained a lot of weight, however, she is unable to quantify.   Home Medications: Prior to Admission medications   Medication Sig Start Date End Date Taking? Authorizing Provider  aspirin 81 MG tablet Take 81 mg by mouth daily.     Yes Historical Provider, MD  ibuprofen (ADVIL,MOTRIN) 200 MG tablet Take 400 mg by mouth daily as needed. For pain   Yes Historical Provider, MD   labetalol (NORMODYNE) 200 MG tablet Take 200 mg by mouth 2 (two) times daily.     Yes Historical Provider, MD    Allergies:  Allergies  Allergen Reactions  . Penicillins Hives    Childhood allergy    Past Medical History: Past Medical History  Diagnosis Date  . Hypertension   . Heart murmur   . Atrial flutter   . Gout     Denies any surgeries in the past  Social History:  reports that she has never smoked. She does not have any smokeless tobacco history on file. She reports that she does not drink alcohol or use illicit drugs.  Family History: She mentions a history of hypertension, diabetes, and cancer in the family, but was unable to specify any further.  Review of Systems - History obtained from the patient General ROS: positive for  - fatigue Psychological ROS: negative Ophthalmic ROS: negative ENT ROS: negative Allergy and Immunology ROS: negative Hematological and Lymphatic ROS: negative Endocrine ROS: negative Respiratory ROS: no cough, shortness of breath, or wheezing Cardiovascular ROS: positive for - dyspnea on exertion Gastrointestinal ROS: no abdominal pain, change in bowel habits, or black or bloody stools Genito-Urinary ROS: no dysuria, trouble voiding, or hematuria Musculoskeletal ROS: positive for - joint pain Neurological ROS: no TIA or stroke symptoms Dermatological ROS: negative  Physical Examination Blood pressure 180/136, pulse 100, temperature 98.4 F (36.9 C), temperature source Oral, resp. rate 20, height 5\' 11"  (1.803 m), weight 163.295 kg (360  lb), last menstrual period 12/01/2011, SpO2 100.00%.  General appearance: alert, cooperative, appears stated age, no distress and morbidly obese Head: Normocephalic, without obvious abnormality, atraumatic Eyes: conjunctivae/corneas clear. PERRL, EOM's intact.  Throat: lips, mucosa, and tongue normal; teeth and gums normal Neck: no adenopathy, no carotid bruit, no JVD, supple, symmetrical, trachea  midline and thyroid not enlarged, symmetric, no tenderness/mass/nodules Back: symmetric, no curvature. ROM normal. No CVA tenderness. Resp: clear to auscultation bilaterally Cardio: S1, S2 is irregularly irregular. No history is for no rubs, murmurs, or bruit. 2-3+ pitting edema noted. Bilateral lower extremities. JVD could not be visualized. GI: soft, non-tender; bowel sounds normal; no masses,  no organomegaly Extremities: Pitting edema is noted. Both lower extremities. It extends all the way up to the lower abdomen. She also has tophi noted in both the elbows. No erythema is noted in any of the joints. Pulses: 2+ and symmetric Skin: Skin color, texture, turgor normal. No rashes or lesions Lymph nodes: Cervical, supraclavicular, and axillary nodes normal. Neurologic: She is alert and oriented x3. No focal neurological deficits are present.  Laboratory Data: Results for orders placed during the hospital encounter of 12/10/11 (from the past 48 hour(s))  CBC WITH DIFFERENTIAL     Status: Abnormal   Collection Time   12/10/11  9:28 PM      Component Value Range Comment   WBC 10.3  4.0 - 10.5 K/uL    RBC 3.85 (*) 3.87 - 5.11 MIL/uL    Hemoglobin 11.3 (*) 12.0 - 15.0 g/dL    HCT 16.1 (*) 09.6 - 46.0 %    MCV 91.2  78.0 - 100.0 fL    MCH 29.4  26.0 - 34.0 pg    MCHC 32.2  30.0 - 36.0 g/dL    RDW 04.5 (*) 40.9 - 15.5 %    Platelets 298  150 - 400 K/uL    Neutrophils Relative 77  43 - 77 %    Neutro Abs 7.9 (*) 1.7 - 7.7 K/uL    Lymphocytes Relative 13  12 - 46 %    Lymphs Abs 1.5  0.7 - 4.0 K/uL    Monocytes Relative 6  3 - 12 %    Monocytes Absolute 0.6  0.1 - 1.0 K/uL    Eosinophils Relative 3  0 - 5 %    Eosinophils Absolute 0.3  0.0 - 0.7 K/uL    Basophils Relative 1  0 - 1 %    Basophils Absolute 0.1  0.0 - 0.1 K/uL   COMPREHENSIVE METABOLIC PANEL     Status: Abnormal   Collection Time   12/10/11  9:28 PM      Component Value Range Comment   Sodium 138  135 - 145 mEq/L     Potassium 3.7  3.5 - 5.1 mEq/L    Chloride 101  96 - 112 mEq/L    CO2 22  19 - 32 mEq/L    Glucose, Bld 92  70 - 99 mg/dL    BUN 20  6 - 23 mg/dL    Creatinine, Ser 8.11 (*) 0.50 - 1.10 mg/dL    Calcium 9.5  8.4 - 91.4 mg/dL    Total Protein 8.7 (*) 6.0 - 8.3 g/dL    Albumin 3.3 (*) 3.5 - 5.2 g/dL    AST 16  0 - 37 U/L    ALT 6  0 - 35 U/L    Alkaline Phosphatase 114  39 - 117 U/L    Total  Bilirubin 2.5 (*) 0.3 - 1.2 mg/dL    GFR calc non Af Amer 44 (*) >90 mL/min    GFR calc Af Amer 51 (*) >90 mL/min   TROPONIN I     Status: Normal   Collection Time   12/10/11  9:28 PM      Component Value Range Comment   Troponin I <0.30  <0.30 ng/mL     Radiology Reports: Dg Chest Portable 1 View  12/10/2011  *RADIOLOGY REPORT*  Clinical Data: Chest pain and shortness of breath.  Bilateral lower extremity swelling.  PORTABLE CHEST - 1 VIEW  Comparison: 01/21/2005  Findings: Shallow inspiration.  Cardiac enlargement.  Pulmonary vascularity is normal.  No focal airspace consolidation in the lungs.  No blunting of costophrenic angles.  No pneumothorax. Mediastinal contours appear intact.  Right paratracheal fullness is likely vascular.  No significant change since previous study.  IMPRESSION: Cardiac enlargement without pulmonary vascular congestion.  Lungs clear.   Original Report Authenticated By: Marlon Pel, M.D.     Electrocardiogram: EKG shows atrial fibrillation at 109 beats per minute. Left axis deviation is present. QRS is normal. nonspecific changes are noted. No acute ST changes are noted.  Assessment/Plan  Principal Problem:  *Anasarca Active Problems:  Cardiomegaly  Accelerated hypertension  Elevated serum creatinine  Hyperbilirubinemia  Gout  Atrial fibrillation  Morbid obesity   #1 anasarca: She will require for a further workup for this. An echocardiogram will be ordered. Etiology for this remains unclear, but most likely is the cardiac. She does have significant  cardiomegaly on chest x-ray. Lasix will be provided intravenously. We will go ahead and consult cardiology to see her.  #2 accelerated hypertension: She'll continued on labetalol at a higher dose. Intravenous Lasix should also help control the blood pressure. As needed Hydralazine will be provided. It is likely she may require additional medications to control her blood pressure.  #3 atrial fibrillation: She'll be monitored on telemetry. TSH level will be checked. Echocardiogram will provide more information. Cardiology will be asked to see this patient.  #4 elevated serum creatinine: Based on discharge summary from 2006 her creatinine was 1.8 at that time. It is possible she has some degree of chronic kidney disease. A UA will be checked.  #5 hyperbilirubinemia: Bilirubin will be fractionated. Her other LFTs are normal.  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 A uric acid level will be checked. She may benefit from allopurinol at some point.  #7 morbid obesity: She will require weight loss counseling at some point. She will require testing to screen for OSA.   #8 anemia: Anemia panel will be checked.  DVT, prophylaxis with heparin.  She is a full code.  Have explained to her that she will need to be compliant with her medications and will require followup with cardiology, as well as with primary care physician's in order to better manage her medical problems.  Further management decisions will depend on results of further testing and patient's response to treatment.  [facility]  [facility] Pager [phone]  12/10/2011, 11:50 PM

## 2011-12-11 ENCOUNTER — Encounter (HOSPITAL_COMMUNITY): Payer: Self-pay

## 2011-12-11 DIAGNOSIS — I517 Cardiomegaly: Secondary | ICD-10-CM

## 2011-12-11 DIAGNOSIS — I4891 Unspecified atrial fibrillation: Secondary | ICD-10-CM

## 2011-12-11 DIAGNOSIS — I10 Essential (primary) hypertension: Secondary | ICD-10-CM

## 2011-12-11 DIAGNOSIS — R609 Edema, unspecified: Secondary | ICD-10-CM

## 2011-12-11 DIAGNOSIS — I319 Disease of pericardium, unspecified: Secondary | ICD-10-CM

## 2011-12-11 LAB — CBC
HCT: 32.5 % — ABNORMAL LOW (ref 36.0–46.0)
MCH: 29.8 pg (ref 26.0–34.0)
MCV: 90.5 fL (ref 78.0–100.0)
Platelets: 317 10*3/uL (ref 150–400)
RDW: 16.3 % — ABNORMAL HIGH (ref 11.5–15.5)

## 2011-12-11 LAB — BILIRUBIN, FRACTIONATED(TOT/DIR/INDIR)
Bilirubin, Direct: 1.6 mg/dL — ABNORMAL HIGH (ref 0.0–0.3)
Indirect Bilirubin: 1.2 mg/dL — ABNORMAL HIGH (ref 0.3–0.9)
Total Bilirubin: 2.8 mg/dL — ABNORMAL HIGH (ref 0.3–1.2)

## 2011-12-11 LAB — URINALYSIS, ROUTINE W REFLEX MICROSCOPIC
Ketones, ur: NEGATIVE mg/dL
Leukocytes, UA: NEGATIVE
Nitrite: NEGATIVE
Specific Gravity, Urine: 1.015 (ref 1.005–1.030)
pH: 5.5 (ref 5.0–8.0)

## 2011-12-11 LAB — HEMOGLOBIN A1C
Hgb A1c MFr Bld: 5.3 % (ref <5.7)
Mean Plasma Glucose: 105 mg/dL (ref <117)

## 2011-12-11 LAB — LIPID PANEL
LDL Cholesterol: 71 mg/dL (ref 0–99)
Triglycerides: 77 mg/dL (ref <150)

## 2011-12-11 LAB — URINE MICROSCOPIC-ADD ON

## 2011-12-11 LAB — RETICULOCYTES
RBC.: 3.59 MIL/uL — ABNORMAL LOW (ref 3.87–5.11)
Retic Count, Absolute: 71.8 10*3/uL (ref 19.0–186.0)
Retic Ct Pct: 2 % (ref 0.4–3.1)

## 2011-12-11 LAB — COMPREHENSIVE METABOLIC PANEL
AST: 14 U/L (ref 0–37)
Albumin: 3.3 g/dL — ABNORMAL LOW (ref 3.5–5.2)
Alkaline Phosphatase: 106 U/L (ref 39–117)
Chloride: 100 mEq/L (ref 96–112)
Potassium: 3.4 mEq/L — ABNORMAL LOW (ref 3.5–5.1)
Sodium: 139 mEq/L (ref 135–145)
Total Bilirubin: 2.8 mg/dL — ABNORMAL HIGH (ref 0.3–1.2)
Total Protein: 8.6 g/dL — ABNORMAL HIGH (ref 6.0–8.3)

## 2011-12-11 LAB — PRO B NATRIURETIC PEPTIDE: Pro B Natriuretic peptide (BNP): 10471 pg/mL — ABNORMAL HIGH (ref 0–125)

## 2011-12-11 LAB — IRON AND TIBC: Iron: 49 ug/dL (ref 42–135)

## 2011-12-11 MED ORDER — ACETAMINOPHEN 325 MG PO TABS
650.0000 mg | ORAL_TABLET | Freq: Four times a day (QID) | ORAL | Status: DC | PRN
  Administered 2011-12-12 – 2011-12-18 (×4): 650 mg via ORAL
  Filled 2011-12-11 (×4): qty 2

## 2011-12-11 MED ORDER — SODIUM CHLORIDE 0.9 % IV SOLN: 250.0000 mL | INTRAVENOUS | Status: DC | PRN

## 2011-12-11 MED ORDER — FUROSEMIDE 10 MG/ML IJ SOLN
80.0000 mg | Freq: Two times a day (BID) | INTRAMUSCULAR | Status: DC
  Administered 2011-12-11 (×2): 80 mg via INTRAVENOUS
  Filled 2011-12-11 (×2): qty 8
  Filled 2011-12-11: qty 4

## 2011-12-11 MED ORDER — ACETAMINOPHEN 650 MG RE SUPP: 650.0000 mg | Freq: Four times a day (QID) | RECTAL | Status: DC | PRN

## 2011-12-11 MED ORDER — NITROGLYCERIN 2 % TD OINT
1.0000 [in_us] | TOPICAL_OINTMENT | Freq: Four times a day (QID) | TRANSDERMAL | Status: DC
  Administered 2011-12-11 – 2011-12-15 (×18): 1 [in_us] via TOPICAL
  Filled 2011-12-11 (×18): qty 1

## 2011-12-11 MED ORDER — SODIUM CHLORIDE 0.9 % IJ SOLN
3.0000 mL | INTRAMUSCULAR | Status: DC | PRN
  Administered 2011-12-12: 3 mL via INTRAVENOUS
  Administered 2011-12-20: 01:00:00 via INTRAVENOUS
  Filled 2011-12-11 (×3): qty 3

## 2011-12-11 MED ORDER — SODIUM CHLORIDE 0.9 % IJ SOLN
3.0000 mL | Freq: Two times a day (BID) | INTRAMUSCULAR | Status: DC
  Administered 2011-12-11 – 2011-12-28 (×25): 3 mL via INTRAVENOUS
  Filled 2011-12-11 (×12): qty 3

## 2011-12-11 MED ORDER — PANTOPRAZOLE SODIUM 40 MG PO TBEC
40.0000 mg | DELAYED_RELEASE_TABLET | Freq: Every day | ORAL | Status: DC
  Administered 2011-12-11 – 2012-01-04 (×26): 40 mg via ORAL
  Filled 2011-12-11 (×25): qty 1

## 2011-12-11 MED ORDER — HEPARIN SODIUM (PORCINE) 5000 UNIT/ML IJ SOLN
5000.0000 [IU] | Freq: Three times a day (TID) | INTRAMUSCULAR | Status: DC
  Administered 2011-12-11 – 2011-12-16 (×16): 5000 [IU] via SUBCUTANEOUS
  Filled 2011-12-11 (×16): qty 1

## 2011-12-11 MED ORDER — ONDANSETRON HCL 4 MG PO TABS
4.0000 mg | ORAL_TABLET | Freq: Four times a day (QID) | ORAL | Status: DC | PRN
  Administered 2011-12-11: 4 mg via ORAL
  Filled 2011-12-11: qty 1

## 2011-12-11 MED ORDER — SODIUM CHLORIDE 0.9 % IJ SOLN
INTRAMUSCULAR | Status: AC
  Filled 2011-12-11: qty 6

## 2011-12-11 MED ORDER — IRBESARTAN 150 MG PO TABS
150.0000 mg | ORAL_TABLET | Freq: Every day | ORAL | Status: DC
  Administered 2011-12-11: 150 mg via ORAL
  Filled 2011-12-11: qty 1

## 2011-12-11 MED ORDER — INFLUENZA VIRUS VACC SPLIT PF IM SUSP
0.5000 mL | INTRAMUSCULAR | Status: AC
Start: 2011-12-12 — End: 2011-12-13
  Filled 2011-12-11: qty 0.5

## 2011-12-11 MED ORDER — LABETALOL HCL 200 MG PO TABS
300.0000 mg | ORAL_TABLET | Freq: Two times a day (BID) | ORAL | Status: DC
  Administered 2011-12-11 (×3): 300 mg via ORAL
  Filled 2011-12-11 (×3): qty 2

## 2011-12-11 MED ORDER — POTASSIUM CHLORIDE CRYS ER 20 MEQ PO TBCR
40.0000 meq | EXTENDED_RELEASE_TABLET | Freq: Every day | ORAL | Status: DC
  Administered 2011-12-11 – 2011-12-21 (×11): 40 meq via ORAL
  Filled 2011-12-11 (×12): qty 2

## 2011-12-11 MED ORDER — OXYCODONE HCL 5 MG PO TABS
5.0000 mg | ORAL_TABLET | Freq: Four times a day (QID) | ORAL | Status: DC | PRN
  Administered 2011-12-11 – 2011-12-15 (×14): 5 mg via ORAL
  Filled 2011-12-11 (×14): qty 1

## 2011-12-11 MED ORDER — SODIUM CHLORIDE 0.9 % IJ SOLN
3.0000 mL | Freq: Two times a day (BID) | INTRAMUSCULAR | Status: DC
  Administered 2011-12-11 – 2011-12-25 (×16): 3 mL via INTRAVENOUS
  Filled 2011-12-11 (×9): qty 3

## 2011-12-11 MED ORDER — HYDRALAZINE HCL 20 MG/ML IJ SOLN
10.0000 mg | Freq: Four times a day (QID) | INTRAMUSCULAR | Status: DC | PRN
  Administered 2011-12-11: 10 mg via INTRAVENOUS
  Filled 2011-12-11: qty 1
  Filled 2011-12-11: qty 0.5

## 2011-12-11 MED ORDER — ASPIRIN 81 MG PO CHEW
81.0000 mg | CHEWABLE_TABLET | Freq: Every day | ORAL | Status: DC
  Administered 2011-12-11 – 2012-01-04 (×25): 81 mg via ORAL
  Filled 2011-12-11 (×24): qty 1

## 2011-12-11 MED ORDER — ONDANSETRON HCL 4 MG/2ML IJ SOLN
4.0000 mg | Freq: Four times a day (QID) | INTRAMUSCULAR | Status: DC | PRN
  Filled 2011-12-11: qty 2

## 2011-12-11 MED ORDER — PROMETHAZINE HCL 25 MG PO TABS
12.5000 mg | ORAL_TABLET | Freq: Four times a day (QID) | ORAL | Status: DC | PRN
  Administered 2011-12-11 – 2011-12-12 (×2): 12.5 mg via ORAL
  Filled 2011-12-11 (×2): qty 1

## 2011-12-11 NOTE — Consult Note (Signed)
Primary care: [facility]  Clinical Summary [name] is a 41 y.o.female, former patient of Dr. [name] with [facility], with medical history outlined below. She has had very little regular followup over time, has not seen her primary care provider at least in a year. She presents now with progressive abdominal and leg swelling, occurring at least over the last month. She states that she has been taking labetalol, no other regular medication at home. She is noted to be significantly hypertensive with systolics as high as 220 over diastolics reportedly as high as 150. She has a history of hypertensive heart disease and urgency in the past. Also noted to be in atrial fibrillation of uncertain duration, with previously documented atrial flutter in 2006. Her cardiac markers argue against an acute coronary syndrome. Chest x-ray does indicate cardiomegaly, no recent assessment of LV function is available. She is morbidly obese, greater than 400 pounds. We are asked to assist with her management.  ECG reviewed showing atrial fibrillation with low voltage, cannot rule out old inferior infarct pattern, decreased R wave progression.   Allergies  Allergen Reactions  . Penicillins Hives    Childhood allergy    Medications    . aspirin  81 mg Oral Daily  . furosemide  40 mg Intravenous Once  . furosemide  80 mg Intravenous BID  . heparin  5,000 Units Subcutaneous Q8H  . influenza  inactive virus vaccine  0.5 mL Intramuscular Tomorrow-1000  . labetalol  300 mg Oral BID  . labetalol  40 mg Intravenous Once  . labetalol  40 mg Intravenous Once  . nitroGLYCERIN  1 inch Topical Q6H  . sodium chloride  3 mL Intravenous Q12H  . sodium chloride  3 mL Intravenous Q12H    Past Medical History  Diagnosis Date  . Essential hypertension, benign     History of accelerated hypertension with urgency  . Atrial flutter     Documented 2006 - [facility]  . Gout   . History of cardiac catheterization     Ectatic  coronaries without obstruction 2006 - [facility]  . Morbid obesity   . Hypertensive heart disease     LVEF reportedly 50-55% in 2006 - [facility]  . CKD (chronic kidney disease) stage 3, GFR 30-59 ml/min     History reviewed. No pertinent past surgical history.  Family History  Problem Relation Age of Onset  . Hypertension      Social History [name] reports that she has never smoked. She does not have any smokeless tobacco history on file. [name] reports that she does not drink alcohol.  Review of Systems She reports no sense of palpitations. Indicates no syncope, orthopnea. No regular exercise. States that she is able to take care of her typical ADLs. No bleeding episodes. Otherwise negative.  Physical Examination Blood pressure 182/116, pulse 87, temperature 98.3 F (36.8 C), temperature source Oral, resp. rate 20, height 5\' 11"  (1.803 m), weight 405 lb 12.8 oz (184.07 kg), last menstrual period 12/01/2011, SpO2 98.00%.  Morbidly obese woman in no acute distress. HEENT: Conjunctiva and lids normal, oropharynx clear. Neck: Supple, increased girth, no carotid bruits, no thyromegaly. Lungs: Diminished but clear to auscultation, nonlabored breathing at rest. Cardiac: Irregularly irregular, no S3 or significant systolic murmur, no pericardial rub. Abdomen: Obese, ascites apparent, nontender, bowel sounds present, no rebound. Extremities: Chronic appearing woody edema, distal pulses 1-2+. Skin: Warm and dry. Musculoskeletal: No kyphosis. Neuropsychiatric: Alert and oriented x3, affect grossly appropriate.   Lab  Results Basic Metabolic Panel:  Lab 12/11/11 4540 12/10/11 2128  NA 139 138  K 3.4* 3.7  CL 100 101  CO2 27 22  GLUCOSE 107* 92  BUN 20 20  CREATININE 1.57* 1.48*  CALCIUM 9.5 9.5  MG -- --  PHOS -- --   Liver Function Tests:  Lab 12/11/11 0152 12/11/11 0148 12/10/11 2128  AST 14 -- 16  ALT 6 -- 6  ALKPHOS 106 -- 114  BILITOT 2.8* 2.8* 2.5*  PROT 8.6* -- 8.7*    ALBUMIN 3.3* -- 3.3*   CBC:  Lab 12/11/11 0152 12/10/11 2128  WBC 10.9* 10.3  NEUTROABS -- 7.9*  HGB 10.7* 11.3*  HCT 32.5* 35.1*  MCV 90.5 91.2  PLT 317 298   Cardiac Enzymes:  Lab 12/11/11 0719 12/11/11 0148 12/10/11 2128  CKTOTAL -- -- --  CKMB -- -- --  CKMBINDEX -- -- --  TROPONINI <0.30 <0.30 <0.30    Impression  1. Evidence of volume overload with anasarca. She has a history of hypertensive heart disease, cardiomegaly by chest x-ray, last LVEF was low normal range as of 2006. She also has chronic kidney disease, looks to be stage III, on no diuretic as an outpatient, and with poorly controlled blood pressure.  2. Atrial fibrillation of uncertain duration, previous history of atrial flutter in 2006. Would not be inclined to fully anticoagulate at this point. CHADS2 score is 1. Would like to have her more clinically stable in terms of volume status, blood pressure better controlled, and more information available regarding cardiac structure and function. Continue ASA for now.  3. Morbid obesity. Consider possibility of obstructive sleep apnea.  4. Chronic kidney disease, stage III.  5. Accelerated hypertension, possibly contributed to by noncompliance as well.  Recommendations  Continue intravenous Lasix with goal of further diuresis. Followup repeat echocardiogram for reassessment of cardiac structure and function. Labetalol was increased to 300 mg twice daily, also place her back on Benicar which she had tolerated in the past. Follow renal function and blood pressure control with plan to further titrate medications as needed. We will follow with you.  Jonelle Sidle, M.D., F.A.C.C.

## 2011-12-11 NOTE — ED Notes (Signed)
Patient given bedside commode, used and cleaned as needed. Tolerated well.

## 2011-12-11 NOTE — Evaluation (Signed)
Physical Therapy Evaluation Patient Details Name: [name] MRN: [id number] DOB: 1970-04-04 Today's Date: 12/11/2011 Time: 4098-1191 PT Time Calculation (min): 33 min  PT Assessment / Plan / Recommendation Clinical Impression  Pt was seen for eval.  She is morbidly obese with significant generalized edema.  She c/o bilateral knee soreness with weight bearing, but is able to ambulate with good stability and no assistive device.  Her strength and ROM is WNL.  She is normally totally independent at home.  She was instructed in doing ankle pumps as an adjunct to medical tx for increased LE circulation.  Ice packs were applied to both knees and she was instructed in their use.  If her knee pain continues, she would benefit from the use of a walker in order to make gait more comforatble, but hopefully her soreness will resolve  quickly.    PT Assessment  Patient needs continued PT services (to monitor for the need of a walker)    Follow Up Recommendations  No PT follow up    Does the patient have the potential to tolerate intense rehabilitation      Barriers to Discharge None      Equipment Recommendations  Rolling walker with 5" wheels (only needed if knee pain doesn't resolve)    Recommendations for Other Services     Frequency Min 1X/week    Precautions / Restrictions Precautions Precautions: None Restrictions Weight Bearing Restrictions: No   Pertinent Vitals/Pain       Mobility  Bed Mobility Bed Mobility: Supine to Sit;Sit to Supine Supine to Sit: 6: Modified independent (Device/Increase time) Sit to Supine: 6: Modified independent (Device/Increase time) Transfers Transfers: Sit to Stand;Stand to Sit Sit to Stand: 6: Modified independent (Device/Increase time) Stand to Sit: 6: Modified independent (Device/Increase time) Ambulation/Gait Ambulation/Gait Assistance: 6: Modified independent (Device/Increase time) Ambulation Distance (Feet): 25 Feet Assistive  device: None Gait Pattern: Within Functional Limits General Gait Details: pt does have a lumbering gait pattern due to morbid obesity and she describes knee pain with weight bearing...she feels the knee soreness is due to her fluid retention Stairs: No Wheelchair Mobility Wheelchair Mobility: No    Shoulder Instructions     Exercises General Exercises - Lower Extremity Ankle Circles/Pumps: AROM;Both;20 reps;Seated   PT Diagnosis: Difficulty walking;Acute pain  PT Problem List: Obesity;Decreased mobility;Pain PT Treatment Interventions: Gait training;Other (comment) (only if pain does not resolve)   PT Goals Acute Rehab PT Goals PT Goal Formulation: With patient Time For Goal Achievement: 12/18/11 Potential to Achieve Goals: Good Pt will Ambulate: 16 - 50 feet;with least restrictive assistive device;Other (comment) (use RW if knee pain persists) PT Goal: Ambulate - Progress: Goal set today  Visit Information  Last PT Received On: 12/11/11    Subjective Data  Subjective: I've been walking back and forth to the bathroom Patient Stated Goal: none stated   Prior Functioning  Home Living Lives With: Family Available Help at Discharge: Family Type of Home: House Home Access: Stairs to enter Secretary/administrator of Steps: 4 Entrance Stairs-Rails: Right Home Layout: One level Home Adaptive Equipment: None Prior Function Level of Independence: Independent Able to Take Stairs?: Yes Driving: Yes Communication Communication: No difficulties    Cognition  Overall Cognitive Status: Appears within functional limits for tasks assessed/performed Arousal/Alertness: Awake/alert Orientation Level: Appears intact for tasks assessed Behavior During Session: [facility] for tasks performed    Extremity/Trunk Assessment Right Upper Extremity Assessment RUE ROM/Strength/Tone: Within functional levels Left Upper Extremity Assessment LUE ROM/Strength/Tone: Within  functional levels Right Lower  Extremity Assessment RLE ROM/Strength/Tone: WFL for tasks assessed (knee flex to 90 deg due to edema) RLE Sensation: WFL - Light Touch RLE Coordination: WFL - gross motor Left Lower Extremity Assessment LLE ROM/Strength/Tone: WFL for tasks assessed LLE Sensation: WFL - Light Touch LLE Coordination: WFL - gross motor Trunk Assessment Trunk Assessment: Normal   Balance Balance Balance Assessed: Yes (WNL by functional observation)  End of Session PT - End of Session Equipment Utilized During Treatment:  (ice pack to each knee) Activity Tolerance: Patient tolerated treatment well Patient left: in chair;with call bell/phone within reach  GP     Myrlene Broker L 12/11/2011, 10:32 AM

## 2011-12-11 NOTE — Progress Notes (Signed)
Occupational Therapy Screen  OT orders received. Patient's chart reviewed. Spoke with patient regarding functional performance during daily tasks. Patient voiced no difficulties with performing daily tasks. Patient does not require OT services at this time; will sign off.   Limmie Patricia, OTR/L 12/11/11 2:34PM

## 2011-12-11 NOTE — Progress Notes (Signed)
*  PRELIMINARY RESULTS* Echocardiogram 2D Echocardiogram has been performed.  Conrad Finderne 12/11/2011, 2:36 PM

## 2011-12-11 NOTE — Progress Notes (Signed)
UR Chart Review Completed  

## 2011-12-11 NOTE — Care Management Note (Addendum)
    Page 1 of 2   01/01/2012     10:05:07 AM   CARE MANAGEMENT NOTE 01/01/2012  Patient:  [name], [name]   Account Number:  [id number]  Date Initiated:  12/11/2011  Documentation initiated by:  Rosemary Holms  Subjective/Objective Assessment:   Pt admitted from home. Dx anasara and on diuretic. Pt denies need for [facility] care.     Action/Plan:   Anticipated DC Date:     Anticipated DC Plan:  HOME/SELF CARE      DC Planning Services  CM consult      [facility] Choice  HOME HEALTH   Choice offered to / List presented to:  C-1 Patient        HH arranged  HH-1 RN  HH-2 PT      HH agency  [facility]   Status of service:  Completed, signed off Medicare Important Message given?  YES (If response is "NO", the following Medicare IM given date fields will be blank) Date Medicare IM given:  12/11/2011 Date Additional Medicare IM given:  12/20/2011  Discharge Disposition:  HOME W HOME HEALTH SERVICES  Per UR Regulation:    If discussed at Long Length of Stay Meetings, dates discussed:   12/20/2011  12/25/2011  12/27/2011    Comments:  01-01-12 10am Avie Arenas, RNBSN [phone] [facility] is offering a bed.  Per Dr. Prescott Gum note - he prefers [facility] for CHF patients.  States he has talked to patient and is open to this.  Retalked with patient, hesitant about considering [facility], but states will talk with family and will get back to me. For TEE today - possible ready to tx to [facility] - which ever she chooses by Thursday or Friday according to Dr. Gala Romney.  12-31-11 3pm Jacalyn Biggs, RNBSN [phone] CHF - cellulitis of L leg.  Difficulty standing due to pain,  On IV antibiotics and continues on IV pain med. [facility] referral recieved.  Talked with patient - agreeable to [facility] - choose [facility].  Referral made.  Have bed today, physician at this time opted to wait for tx.  10/28 12:40p debbie dowell rn,bsn have alerted marie w [facility] of ref made when pt at [facility] for  hhrn.  12/21/11 1554 Arlyss Queen, RN BSN CM Pt has chosen [facility] for [employer] and Pt. Alroy Bailiff of [facility] is aware and will collect the pts information from the chart. No DME needs noted. HH services to start within 48 hours of discharge. Pt and pts nurse aware of discharge arrangements.  12/20/11 1521 Arlyss Queen, RN BSN CM Pt discharged home today. Pt refuses to go and IM given. Pt called and appealed discharge to Medicare. QIO signed and information faxed per QIO request. Will await determination.  12/19/11 1515 Amy Robson RN BSN CM Pt states she has never had HH and does not feel she will need it at dc.  12/11/11 1350 Amy Leanord Hawking RN  BSN CM

## 2011-12-11 NOTE — Progress Notes (Signed)
     Subjective: This lady was admitted with increasing leg swelling and slight increased her breath. She denies any chest pain.           Physical Exam: Blood pressure 182/116, pulse 87, temperature 98.3 F (36.8 C), temperature source Oral, resp. rate 20, height 5\' 11"  (1.803 m), weight 184.07 kg (405 lb 12.8 oz), last menstrual period 12/01/2011, SpO2 98.00%. She is morbidly obese. She has peripheral pitting edema in her legs. Heart sounds are present and normal and irregularly irregular, consistent with atrial fibrillation. Lung fields are clear. She is alert and orientated.   Investigations:     Basic Metabolic Panel:  Basename 12/11/11 0152 12/10/11 2128  NA 139 138  K 3.4* 3.7  CL 100 101  CO2 27 22  GLUCOSE 107* 92  BUN 20 20  CREATININE 1.57* 1.48*  CALCIUM 9.5 9.5  MG -- --  PHOS -- --   Liver Function Tests:  Basename 12/11/11 0152 12/11/11 0148 12/10/11 2128  AST 14 -- 16  ALT 6 -- 6  ALKPHOS 106 -- 114  BILITOT 2.8* 2.8* --  PROT 8.6* -- 8.7*  ALBUMIN 3.3* -- 3.3*     CBC:  Basename 12/11/11 0152 12/10/11 2128  WBC 10.9* 10.3  NEUTROABS -- 7.9*  HGB 10.7* 11.3*  HCT 32.5* 35.1*  MCV 90.5 91.2  PLT 317 298    Dg Chest Portable 1 View  12/10/2011  *RADIOLOGY REPORT*  Clinical Data: Chest pain and shortness of breath.  Bilateral lower extremity swelling.  PORTABLE CHEST - 1 VIEW  Comparison: 01/21/2005  Findings: Shallow inspiration.  Cardiac enlargement.  Pulmonary vascularity is normal.  No focal airspace consolidation in the lungs.  No blunting of costophrenic angles.  No pneumothorax. Mediastinal contours appear intact.  Right paratracheal fullness is likely vascular.  No significant change since previous study.  IMPRESSION: Cardiac enlargement without pulmonary vascular congestion.  Lungs clear.   Original Report Authenticated By: Marlon Pel, M.D.       Medications: I have reviewed the patient's current  medications.  Impression: 1. Leg edema. Unclear etiology. 2. Uncontrolled hypertension with evidence of noncompliance. 3. Cardiomegaly on chest x-ray. 4. Atrial fibrillation. 5. Chronic kidney disease. 6. Hypokalemia.     Plan: 1. Agree with intravenous diuretics. 2. Await echocardiogram. Appreciate cardiology input. 3. Replete potassium.     LOS: 1 day   Wilson Singer Pager [phone]  12/11/2011, 11:42 AM

## 2011-12-12 DIAGNOSIS — N289 Disorder of kidney and ureter, unspecified: Secondary | ICD-10-CM

## 2011-12-12 DIAGNOSIS — I5033 Acute on chronic diastolic (congestive) heart failure: Principal | ICD-10-CM | POA: Insufficient documentation

## 2011-12-12 DIAGNOSIS — D529 Folate deficiency anemia, unspecified: Secondary | ICD-10-CM

## 2011-12-12 DIAGNOSIS — I319 Disease of pericardium, unspecified: Secondary | ICD-10-CM

## 2011-12-12 LAB — URINALYSIS, ROUTINE W REFLEX MICROSCOPIC
Bilirubin Urine: NEGATIVE
Glucose, UA: NEGATIVE mg/dL
Hgb urine dipstick: NEGATIVE
Specific Gravity, Urine: 1.01 (ref 1.005–1.030)
pH: 5.5 (ref 5.0–8.0)

## 2011-12-12 LAB — BASIC METABOLIC PANEL
BUN: 21 mg/dL (ref 6–23)
Calcium: 9.3 mg/dL (ref 8.4–10.5)
GFR calc Af Amer: 46 mL/min — ABNORMAL LOW (ref >90)
GFR calc non Af Amer: 40 mL/min — ABNORMAL LOW (ref >90)
Glucose, Bld: 99 mg/dL (ref 70–99)
Potassium: 4 mEq/L (ref 3.5–5.1)
Sodium: 138 mEq/L (ref 135–145)

## 2011-12-12 LAB — FERRITIN: Ferritin: 76 ng/mL (ref 10–291)

## 2011-12-12 MED ORDER — FUROSEMIDE 10 MG/ML IJ SOLN
60.0000 mg | Freq: Three times a day (TID) | INTRAMUSCULAR | Status: DC
  Administered 2011-12-12: 60 mg via INTRAVENOUS
  Filled 2011-12-12 (×3): qty 6

## 2011-12-12 MED ORDER — IRBESARTAN 150 MG PO TABS
150.0000 mg | ORAL_TABLET | Freq: Two times a day (BID) | ORAL | Status: DC
  Administered 2011-12-12 – 2011-12-14 (×5): 150 mg via ORAL
  Filled 2011-12-12 (×5): qty 1

## 2011-12-12 MED ORDER — FUROSEMIDE 10 MG/ML IJ SOLN
60.0000 mg | Freq: Three times a day (TID) | INTRAMUSCULAR | Status: DC
  Administered 2011-12-13 – 2011-12-14 (×4): 60 mg via INTRAVENOUS
  Filled 2011-12-12 (×3): qty 6

## 2011-12-12 MED ORDER — LABETALOL HCL 200 MG PO TABS
400.0000 mg | ORAL_TABLET | Freq: Two times a day (BID) | ORAL | Status: DC
Start: 2011-12-12 — End: 2012-01-04
  Administered 2011-12-12 – 2012-01-04 (×47): 400 mg via ORAL
  Filled 2011-12-12 (×26): qty 2
  Filled 2011-12-12: qty 1
  Filled 2011-12-12 (×23): qty 2

## 2011-12-12 MED ORDER — FUROSEMIDE 10 MG/ML IJ SOLN
60.0000 mg | Freq: Once | INTRAMUSCULAR | Status: AC
  Administered 2011-12-12: 60 mg via INTRAVENOUS

## 2011-12-12 MED ORDER — SODIUM CHLORIDE 0.9 % IJ SOLN
INTRAMUSCULAR | Status: AC
  Administered 2011-12-12: 10 mL
  Filled 2011-12-12: qty 3

## 2011-12-12 NOTE — Progress Notes (Signed)
Subjective:   Patient seated chair. No major change in symptoms, still with significant edema, not breathless.   Objective:   Temp:  [98.1 F (36.7 C)-98.4 F (36.9 C)] 98.4 F (36.9 C) (10/16 0414) Pulse Rate:  [87-92] 87  (10/16 0414) Resp:  [20] 20  (10/16 0414) BP: (162-184)/(99-130) 165/103 mmHg (10/16 0414) SpO2:  [93 %-100 %] 97 % (10/16 0414) Weight:  [398 lb 1.6 oz (180.577 kg)] 398 lb 1.6 oz (180.577 kg) (10/16 0520) Last BM Date: 12/11/11  Filed Weights   12/11/11 0248 12/11/11 0500 12/12/11 0520  Weight: 405 lb 12.8 oz (184.07 kg) 405 lb 12.8 oz (184.07 kg) 398 lb 1.6 oz (180.577 kg)    Intake/Output Summary (Last 24 hours) at 12/12/11 0910 Last data filed at 12/12/11 0340  Gross per 24 hour  Intake    240 ml  Output    600 ml  Net   -360 ml   Telemetry: Atrial fibrillation.  Exam:  General: Morbidly obese, no acute distress.  Lungs: Decreased breath sounds, no rales.  Cardiac: Irregularly irregular.  Abdomen: Protuberant, ascites present.  Extremities: Chronic woody edema.  Lab Results:  Basic Metabolic Panel:  Lab 12/12/11 1610 12/11/11 0152 12/10/11 2128  NA 138 139 138  K 4.0 3.4* 3.7  CL 102 100 101  CO2 24 27 22   GLUCOSE 99 107* 92  BUN 21 20 20   CREATININE 1.60* 1.57* 1.48*  CALCIUM 9.3 9.5 9.5  MG -- -- --    Liver Function Tests:  Lab 12/11/11 0152 12/11/11 0148 12/10/11 2128  AST 14 -- 16  ALT 6 -- 6  ALKPHOS 106 -- 114  BILITOT 2.8* 2.8* 2.5*  PROT 8.6* -- 8.7*  ALBUMIN 3.3* -- 3.3*    CBC:  Lab 12/11/11 0152 12/10/11 2128  WBC 10.9* 10.3  HGB 10.7* 11.3*  HCT 32.5* 35.1*  MCV 90.5 91.2  PLT 317 298    Cardiac Enzymes:  Lab 12/11/11 1332 12/11/11 0719 12/11/11 0148  CKTOTAL -- -- --  CKMB -- -- --  CKMBINDEX -- -- --  TROPONINI <0.30 <0.30 <0.30    BNP:  Basename 12/11/11 1330  PROBNP 10471.0*    Coagulation:  Lab 12/11/11 0152  INR 1.51*     Medications:   Scheduled Medications:    .  aspirin  81 mg Oral Daily  . furosemide  80 mg Intravenous BID  . heparin  5,000 Units Subcutaneous Q8H  . influenza  inactive virus vaccine  0.5 mL Intramuscular Tomorrow-1000  . irbesartan  150 mg Oral Daily  . labetalol  300 mg Oral BID  . nitroGLYCERIN  1 inch Topical Q6H  . pantoprazole  40 mg Oral Daily  . potassium chloride  40 mEq Oral Daily  . sodium chloride  3 mL Intravenous Q12H  . sodium chloride  3 mL Intravenous Q12H  . sodium chloride      . sodium chloride          Assessment:   1. Significant volume overload with anasarca, likely acute on chronic diastolic heart failure in the setting of poorly controlled hypertension. Echocardiogram also shows severe LVH with echodense myocardium and features suggesting the possibility of an infiltrative disorder such as amyloidosis. This would be also be supported by low voltage ECG, although certainly her body habitus may also be playing a role. Weight has decreased with diuresis, intake and output was not completely calculated yesterday.  2. Hypertension, poorly controlled. Medications are being adjusted.  3. Morbid obesity.  4. Chronic kidney disease, stage III.  5. Atrial fibrillation, duration uncertain. Not anticoagulated at this point.   Plan/Discussion:    Patient was discussed with Dr. Karilyn Cota. Plan to increase labetalol and irbesartan doses, change Lasix dosing to 3 times a day, follow renal function and net diuresis closely. May want to consult with HemeOnc to get opinion regarding a basic workup for possible amyloidosis, perhaps fat pad biopsy and other testing (free light chain assay, etc.). She would not be a good candidate for a cardiac MRI at this time based on her weight  Depending on the adequacy of volume removal with IV Lasix, consideration can be given to whether she might benefit further from ultrafiltration. Would need to communicate with Dr. Gala Romney on the CHF team at [facility],   Jonelle Sidle,  M.D., F.A.C.C.

## 2011-12-12 NOTE — Progress Notes (Signed)
Pt reports a significant decrease in knee pain and is able to walk more easily.  Therefore, I do not want to get her onto a walker.  Since she is functionally independent, I will sign off.

## 2011-12-12 NOTE — Progress Notes (Signed)
PT Cancellation Note  Patient Details Name: [name] MRN: [id number] DOB: Jan 08, 1971   Cancelled Treatment:    Reason Eval/Treat Not Completed:  (PT no longer needed)   Myrlene Broker L 12/12/2011, 10:04 AM

## 2011-12-12 NOTE — Progress Notes (Signed)
Patient does not want to take Lasix this late at night. Spoke with MD, he is agreeable to change times to 0700, 1100, and 1500.

## 2011-12-12 NOTE — Consult Note (Signed)
[facility] Consultation Oncology  Name: [name]      MRN: [id number]    Location: W098/J191-47  Date: 12/12/2011 Time:5:34 PM   REFERRING PHYSICIAN:  Lilly Cove, MD  REASON FOR CONSULT:  Amyloidosis work-up  HISTORY OF PRESENT ILLNESS:   This is a 41 year old female who presented to the ED with LE swelling on 12/10/2011 who has a past medical history significant for gout, morbid obesity, atrial fibrillation/flutter, uncontrolled HTN.    In the hospital, cardiology was consulted to help manage the patient's HTN and fluid overload.  Part of the work-up included an EKG which showed low voltage pattern (possibly due to her body habitus) and an Echocardiogram was completed revealing moderate-sized pericardial effusion and a very thick myocardium suggestive of infiltrative disease.  Laboratory work reveals renal insufficiency, elevated BNP of over 10,000, hyperproteinemia, elevated total bilirubin, and hypoalbuminemia.  Her folate is also in the indeterminate range so we will need to give her Folate PO.  She is also normocytically anemic.  Oncology was consulted to rule out amyloidosis.  Donya denies any headaches, dizziness, double vision, fevers, chills, night sweats, nausea, vomiting, diarrhea, constipation, blood in stool, black tarry stool, increased SOB or dyspnea, chest pain, heart palpations, urinary pain/burning/frequency, hematuria, pain, vaginal bleeding.  Complete ROS questioning is negative.     PAST MEDICAL HISTORY:   Past Medical History  Diagnosis Date  . Essential hypertension, benign     History of accelerated hypertension with urgency  . Atrial flutter     Documented 2006 - [facility]  . Gout   . History of cardiac catheterization     Ectatic coronaries without obstruction 2006 - [facility]  . Morbid obesity   . Hypertensive heart disease     LVEF reportedly 50-55% in 2006 - [facility]  . CKD (chronic kidney disease) stage 3, GFR 30-59 ml/min      ALLERGIES: Allergies  Allergen Reactions  . Penicillins Hives    Childhood allergy      MEDICATIONS: I have reviewed the patient's current medications.     PAST SURGICAL HISTORY History reviewed. No pertinent past surgical history.  FAMILY HISTORY: Family History  Problem Relation Age of Onset  . Hypertension      SOCIAL HISTORY:  reports that she has never smoked. She does not have any smokeless tobacco history on file. She reports that she does not drink alcohol or use illicit drugs.  PERFORMANCE STATUS: The patient's performance status is 3 - Symptomatic, >50% confined to bed  PHYSICAL EXAM: Most Recent Vital Signs: Blood pressure 164/91, pulse 87, temperature 98.2 F (36.8 C), temperature source Oral, resp. rate 20, height 5\' 11"  (1.803 m), weight 398 lb 1.6 oz (180.577 kg), last menstrual period 12/01/2011, SpO2 95.00%. General appearance: alert, cooperative, appears older than stated age, no distress and morbidly obese Head: Normocephalic, without obvious abnormality, atraumatic Eyes: negative findings: lids and lashes normal, conjunctivae and sclerae normal and pupils equal, round, reactive to light and accomodation Neck: supple, symmetrical, trachea midline Lungs: clear to auscultation bilaterally Heart: regular rate and rhythm, S1, S2 normal, no murmur, click, rub or gallop Abdomen: abnormal findings:  obese and positive bowel sounds, ascities, nontender.  Difficult to assess for hepatosplenomegaly due to body habitus Extremities: edema diffuse Skin: no lesions noted Neurologic: Grossly normal  LABORATORY DATA:  Results for orders placed during the hospital encounter of 12/10/11 (from the past 48 hour(s))  CBC WITH DIFFERENTIAL     Status: Abnormal  Collection Time   12/10/11  9:28 PM      Component Value Range Comment   WBC 10.3  4.0 - 10.5 K/uL    RBC 3.85 (*) 3.87 - 5.11 MIL/uL    Hemoglobin 11.3 (*) 12.0 - 15.0 g/dL    HCT 16.1 (*) 09.6 - 46.0 %     MCV 91.2  78.0 - 100.0 fL    MCH 29.4  26.0 - 34.0 pg    MCHC 32.2  30.0 - 36.0 g/dL    RDW 04.5 (*) 40.9 - 15.5 %    Platelets 298  150 - 400 K/uL    Neutrophils Relative 77  43 - 77 %    Neutro Abs 7.9 (*) 1.7 - 7.7 K/uL    Lymphocytes Relative 13  12 - 46 %    Lymphs Abs 1.5  0.7 - 4.0 K/uL    Monocytes Relative 6  3 - 12 %    Monocytes Absolute 0.6  0.1 - 1.0 K/uL    Eosinophils Relative 3  0 - 5 %    Eosinophils Absolute 0.3  0.0 - 0.7 K/uL    Basophils Relative 1  0 - 1 %    Basophils Absolute 0.1  0.0 - 0.1 K/uL   COMPREHENSIVE METABOLIC PANEL     Status: Abnormal   Collection Time   12/10/11  9:28 PM      Component Value Range Comment   Sodium 138  135 - 145 mEq/L    Potassium 3.7  3.5 - 5.1 mEq/L    Chloride 101  96 - 112 mEq/L    CO2 22  19 - 32 mEq/L    Glucose, Bld 92  70 - 99 mg/dL    BUN 20  6 - 23 mg/dL    Creatinine, Ser 8.11 (*) 0.50 - 1.10 mg/dL    Calcium 9.5  8.4 - 91.4 mg/dL    Total Protein 8.7 (*) 6.0 - 8.3 g/dL    Albumin 3.3 (*) 3.5 - 5.2 g/dL    AST 16  0 - 37 U/L    ALT 6  0 - 35 U/L    Alkaline Phosphatase 114  39 - 117 U/L    Total Bilirubin 2.5 (*) 0.3 - 1.2 mg/dL    GFR calc non Af Amer 44 (*) >90 mL/min    GFR calc Af Amer 51 (*) >90 mL/min   TROPONIN I     Status: Normal   Collection Time   12/10/11  9:28 PM      Component Value Range Comment   Troponin I <0.30  <0.30 ng/mL   URIC ACID     Status: Abnormal   Collection Time   12/11/11  1:48 AM      Component Value Range Comment   Uric Acid, Serum 11.0 (*) 2.4 - 7.0 mg/dL   TSH     Status: Abnormal   Collection Time   12/11/11  1:48 AM      Component Value Range Comment   TSH 4.896 (*) 0.350 - 4.500 uIU/mL   BILIRUBIN, FRACTIONATED(TOT/DIR/INDIR)     Status: Abnormal   Collection Time   12/11/11  1:48 AM      Component Value Range Comment   Total Bilirubin 2.8 (*) 0.3 - 1.2 mg/dL    Bilirubin, Direct 1.6 (*) 0.0 - 0.3 mg/dL    Indirect Bilirubin 1.2 (*) 0.3 - 0.9 mg/dL    TROPONIN I     Status: Normal  Collection Time   12/11/11  1:48 AM      Component Value Range Comment   Troponin I <0.30  <0.30 ng/mL   HEMOGLOBIN A1C     Status: Normal   Collection Time   12/11/11  1:48 AM      Component Value Range Comment   Hemoglobin A1C 5.3  <5.7 %    Mean Plasma Glucose 105  <117 mg/dL   LIPID PANEL     Status: Abnormal   Collection Time   12/11/11  1:52 AM      Component Value Range Comment   Cholesterol 107  0 - 200 mg/dL    Triglycerides 77  <161 mg/dL    HDL 21 (*) >09 mg/dL    Total CHOL/HDL Ratio 5.1      VLDL 15  0 - 40 mg/dL    LDL Cholesterol 71  0 - 99 mg/dL   VITAMIN U04     Status: Normal   Collection Time   12/11/11  1:52 AM      Component Value Range Comment   Vitamin B-12 793  211 - 911 pg/mL   FOLATE     Status: Normal   Collection Time   12/11/11  1:52 AM      Component Value Range Comment   Folate 3.6     IRON AND TIBC     Status: Abnormal   Collection Time   12/11/11  1:52 AM      Component Value Range Comment   Iron 49  42 - 135 ug/dL    TIBC 540  981 - 191 ug/dL    Saturation Ratios 14 (*) 20 - 55 %    UIBC 304  125 - 400 ug/dL   FERRITIN     Status: Normal   Collection Time   12/11/11  1:52 AM      Component Value Range Comment   Ferritin 76  10 - 291 ng/mL   RETICULOCYTES     Status: Abnormal   Collection Time   12/11/11  1:52 AM      Component Value Range Comment   Retic Ct Pct 2.0  0.4 - 3.1 %    RBC. 3.59 (*) 3.87 - 5.11 MIL/uL    Retic Count, Manual 71.8  19.0 - 186.0 K/uL   COMPREHENSIVE METABOLIC PANEL     Status: Abnormal   Collection Time   12/11/11  1:52 AM      Component Value Range Comment   Sodium 139  135 - 145 mEq/L    Potassium 3.4 (*) 3.5 - 5.1 mEq/L    Chloride 100  96 - 112 mEq/L    CO2 27  19 - 32 mEq/L    Glucose, Bld 107 (*) 70 - 99 mg/dL    BUN 20  6 - 23 mg/dL    Creatinine, Ser 4.78 (*) 0.50 - 1.10 mg/dL    Calcium 9.5  8.4 - 29.5 mg/dL    Total Protein 8.6 (*) 6.0 - 8.3 g/dL     Albumin 3.3 (*) 3.5 - 5.2 g/dL    AST 14  0 - 37 U/L    ALT 6  0 - 35 U/L    Alkaline Phosphatase 106  39 - 117 U/L    Total Bilirubin 2.8 (*) 0.3 - 1.2 mg/dL    GFR calc non Af Amer 41 (*) >90 mL/min    GFR calc Af Amer 47 (*) >90 mL/min   CBC  Status: Abnormal   Collection Time   12/11/11  1:52 AM      Component Value Range Comment   WBC 10.9 (*) 4.0 - 10.5 K/uL    RBC 3.59 (*) 3.87 - 5.11 MIL/uL    Hemoglobin 10.7 (*) 12.0 - 15.0 g/dL    HCT 16.1 (*) 09.6 - 46.0 %    MCV 90.5  78.0 - 100.0 fL    MCH 29.8  26.0 - 34.0 pg    MCHC 32.9  30.0 - 36.0 g/dL    RDW 04.5 (*) 40.9 - 15.5 %    Platelets 317  150 - 400 K/uL   PROTIME-INR     Status: Abnormal   Collection Time   12/11/11  1:52 AM      Component Value Range Comment   Prothrombin Time 17.8 (*) 11.6 - 15.2 seconds    INR 1.51 (*) 0.00 - 1.49   APTT     Status: Abnormal   Collection Time   12/11/11  1:52 AM      Component Value Range Comment   aPTT 39 (*) 24 - 37 seconds   URINALYSIS, ROUTINE W REFLEX MICROSCOPIC     Status: Abnormal   Collection Time   12/11/11  3:48 AM      Component Value Range Comment   Color, Urine YELLOW  YELLOW    APPearance CLEAR  CLEAR    Specific Gravity, Urine 1.015  1.005 - 1.030    pH 5.5  5.0 - 8.0    Glucose, UA NEGATIVE  NEGATIVE mg/dL    Hgb urine dipstick LARGE (*) NEGATIVE    Bilirubin Urine NEGATIVE  NEGATIVE    Ketones, ur NEGATIVE  NEGATIVE mg/dL    Protein, ur NEGATIVE  NEGATIVE mg/dL    Urobilinogen, UA 0.2  0.0 - 1.0 mg/dL    Nitrite NEGATIVE  NEGATIVE    Leukocytes, UA NEGATIVE  NEGATIVE   URINE MICROSCOPIC-ADD ON     Status: Abnormal   Collection Time   12/11/11  3:48 AM      Component Value Range Comment   Squamous Epithelial / LPF FEW (*) RARE    WBC, UA 0-2  <3 WBC/hpf    RBC / HPF 11-20  <3 RBC/hpf    Bacteria, UA FEW (*) RARE   TROPONIN I     Status: Normal   Collection Time   12/11/11  7:19 AM      Component Value Range Comment   Troponin I <0.30  <0.30  ng/mL   PRO B NATRIURETIC PEPTIDE     Status: Abnormal   Collection Time   12/11/11  1:30 PM      Component Value Range Comment   Pro B Natriuretic peptide (BNP) 10471.0 (*) 0 - 125 pg/mL   TSH     Status: Normal   Collection Time   12/11/11  1:30 PM      Component Value Range Comment   TSH 3.610  0.350 - 4.500 uIU/mL   TROPONIN I     Status: Normal   Collection Time   12/11/11  1:32 PM      Component Value Range Comment   Troponin I <0.30  <0.30 ng/mL   BASIC METABOLIC PANEL     Status: Abnormal   Collection Time   12/12/11  5:17 AM      Component Value Range Comment   Sodium 138  135 - 145 mEq/L    Potassium 4.0  3.5 - 5.1  mEq/L    Chloride 102  96 - 112 mEq/L    CO2 24  19 - 32 mEq/L    Glucose, Bld 99  70 - 99 mg/dL    BUN 21  6 - 23 mg/dL    Creatinine, Ser 1.61 (*) 0.50 - 1.10 mg/dL    Calcium 9.3  8.4 - 09.6 mg/dL    GFR calc non Af Amer 40 (*) >90 mL/min    GFR calc Af Amer 46 (*) >90 mL/min       RADIOGRAPHY: Dg Chest Portable 1 View  12/10/2011  *RADIOLOGY REPORT*  Clinical Data: Chest pain and shortness of breath.  Bilateral lower extremity swelling.  PORTABLE CHEST - 1 VIEW  Comparison: 01/21/2005  Findings: Shallow inspiration.  Cardiac enlargement.  Pulmonary vascularity is normal.  No focal airspace consolidation in the lungs.  No blunting of costophrenic angles.  No pneumothorax. Mediastinal contours appear intact.  Right paratracheal fullness is likely vascular.  No significant change since previous study.  IMPRESSION: Cardiac enlargement without pulmonary vascular congestion.  Lungs clear.   Original Report Authenticated By: Marlon Pel, M.D.        ASSESSMENT:  1. Moderate-sized pericardial effusion and a very thick myocardium 2. Low voltage EKG 3. Anasarca 4. Renal insufficiency 5. Folate Deficiency 6. Anemia,  7. Elevated BNP of over 10,000 8. Hyperproteinemia 9. Elevated total bilirubin 10. Hypoalbuminemia   PLAN:  1. Folate 1 mg daily  PO 2. SPEP with quantitative IFE 3. Serum light chain assay 4. Urinalysis for proteinuria 5. [place] of abdomen to evaluate for organ enlargement (KIDNEYS, SPLEEN, LIVER).   All questions were answered. The patient knows to call the clinic with any problems, questions or concerns. We can certainly see the patient much sooner if necessary.  The patient and plan discussed with Glenford Peers, MD and he is in agreement with the aforementioned.  KEFALAS,THOMAS

## 2011-12-12 NOTE — Progress Notes (Signed)
     Subjective: This lady was admitted with increasing leg swelling and slight increased her breath. She denies any chest pain. She has lost approximately 6 pounds overnight but her diuresis and not profound. Her echocardiogram was very abnormal with a moderate-sized pericardial effusion and a very thick myocardium suggestive of infiltrative disease.           Physical Exam: Blood pressure 165/103, pulse 87, temperature 98.4 F (36.9 C), temperature source Oral, resp. rate 20, height 5\' 11"  (1.803 m), weight 180.577 kg (398 lb 1.6 oz), last menstrual period 12/01/2011, SpO2 97.00%. She is morbidly obese. She has peripheral pitting edema in her legs. Heart sounds are present and normal and irregularly irregular, consistent with atrial fibrillation. Lung fields are clear. She is alert and orientated.   Investigations:     Basic Metabolic Panel:  Basename 12/12/11 0517 12/11/11 0152  NA 138 139  K 4.0 3.4*  CL 102 100  CO2 24 27  GLUCOSE 99 107*  BUN 21 20  CREATININE 1.60* 1.57*  CALCIUM 9.3 9.5  MG -- --  PHOS -- --   Liver Function Tests:  Basename 12/11/11 0152 12/11/11 0148 12/10/11 2128  AST 14 -- 16  ALT 6 -- 6  ALKPHOS 106 -- 114  BILITOT 2.8* 2.8* --  PROT 8.6* -- 8.7*  ALBUMIN 3.3* -- 3.3*     CBC:  Basename 12/11/11 0152 12/10/11 2128  WBC 10.9* 10.3  NEUTROABS -- 7.9*  HGB 10.7* 11.3*  HCT 32.5* 35.1*  MCV 90.5 91.2  PLT 317 298    Dg Chest Portable 1 View  12/10/2011  *RADIOLOGY REPORT*  Clinical Data: Chest pain and shortness of breath.  Bilateral lower extremity swelling.  PORTABLE CHEST - 1 VIEW  Comparison: 01/21/2005  Findings: Shallow inspiration.  Cardiac enlargement.  Pulmonary vascularity is normal.  No focal airspace consolidation in the lungs.  No blunting of costophrenic angles.  No pneumothorax. Mediastinal contours appear intact.  Right paratracheal fullness is likely vascular.  No significant change since previous study.   IMPRESSION: Cardiac enlargement without pulmonary vascular congestion.  Lungs clear.   Original Report Authenticated By: Marlon Pel, M.D.       Medications: I have reviewed the patient's current medications.  Impression: 1. Diastolic congestive heart failure. Abnormal echocardiogram suggestive of possible infiltrative disease such as amyloidosis. 2. Uncontrolled hypertension with evidence of noncompliance. 3. Obesity. 4. Atrial fibrillation. 5. Chronic kidney disease.      Plan: 1. Agree with Dr. Diona Browner regarding increasing diuretic therapy. 2. I will ask hematology/oncology regarding further testing for possible diagnosis of amyloidosis. 3. She may need surgical consultation for a fat pad biopsy also. 4. If there is not adequate diuresis, she would be a good candidate for ultrafiltration.     LOS: 2 days   Wilson Singer Pager [phone]  12/12/2011, 9:52 AM

## 2011-12-13 ENCOUNTER — Inpatient Hospital Stay (HOSPITAL_COMMUNITY): Payer: Medicare Other

## 2011-12-13 DIAGNOSIS — G9389 Other specified disorders of brain: Secondary | ICD-10-CM

## 2011-12-13 DIAGNOSIS — E859 Amyloidosis, unspecified: Secondary | ICD-10-CM

## 2011-12-13 DIAGNOSIS — E8809 Other disorders of plasma-protein metabolism, not elsewhere classified: Secondary | ICD-10-CM

## 2011-12-13 LAB — BASIC METABOLIC PANEL
CO2: 28 mEq/L (ref 19–32)
Calcium: 9.1 mg/dL (ref 8.4–10.5)
Creatinine, Ser: 1.8 mg/dL — ABNORMAL HIGH (ref 0.50–1.10)

## 2011-12-13 MED ORDER — AMLODIPINE BESYLATE 5 MG PO TABS
5.0000 mg | ORAL_TABLET | Freq: Every day | ORAL | Status: DC
  Administered 2011-12-13 – 2011-12-14 (×2): 5 mg via ORAL
  Filled 2011-12-13 (×2): qty 1

## 2011-12-13 MED ORDER — FOLIC ACID 1 MG PO TABS
1.0000 mg | ORAL_TABLET | Freq: Every day | ORAL | Status: DC
  Administered 2011-12-13 – 2012-01-04 (×23): 1 mg via ORAL
  Filled 2011-12-13 (×23): qty 1

## 2011-12-13 MED ORDER — SODIUM CHLORIDE 0.9 % IJ SOLN
INTRAMUSCULAR | Status: AC
  Administered 2011-12-13: 3 mL
  Filled 2011-12-13: qty 3

## 2011-12-13 NOTE — Progress Notes (Signed)
Subjective: The patient is seen sitting in her recliner finishing breakfast.  She reports that she has a strong appetite.  She denies any nausea, vomiting, diarrhea, constipation, and rash.  She denies any complaints this AM.  She asked again what we are evaluating her for so I wrote the name of the disease on a piece of paper for her to have (amyloidosis).   She denies any complaints presently.  Objective: Vital signs in last 24 hours: Temp:  [98.2 F (36.8 C)] 98.2 F (36.8 C) (10/17 0537) Pulse Rate:  [86-91] 86  (10/17 0537) Resp:  [20-24] 20  (10/17 0537) BP: (164-170)/(91-125) 165/100 mmHg (10/17 0537) SpO2:  [94 %-97 %] 97 % (10/17 0537)  Intake/Output from previous day:   Intake/Output this shift:   General appearance: alert, appears older than stated age, no distress and morbidly obese Extremities: edema woody infiltration edema of LE Skin: No rash appreciated.  Lab Results:   Basename 12/11/11 0152 12/10/11 2128  WBC 10.9* 10.3  HGB 10.7* 11.3*  HCT 32.5* 35.1*  PLT 317 298   BMET  Basename 12/13/11 0522 12/12/11 0517  NA 138 138  K 4.1 4.0  CL 101 102  CO2 28 24  GLUCOSE 92 99  BUN 23 21  CREATININE 1.80* 1.60*  CALCIUM 9.1 9.3    Studies/Results: No results found.  Medications: I have reviewed the patient's current medications.  Assessment/Plan: 1. Moderate-sized pericardial effusion and a very thick myocardium.  Ordered SPEP with IFE, serum light chain assay and these are pending.  Urinalysis for proteinuria which is negative.  [place] of abdomen to evaluate for organomegaly and infiltration (kidneys, spleen, liver) today. 2. Low voltage EKG  3. Anasarca  4. Renal insufficiency  5. Folate Deficiency, started 1 mg PO daily 6. Anemia  7. Elevated BNP of over 10,000  8. Hyperproteinemia  9. Elevated total bilirubin  10. Hypoalbuminemia 11.  Will continue to follow while an inpatient.     LOS: 3 days    Sidnie Swalley 12/13/2011

## 2011-12-13 NOTE — Progress Notes (Signed)
Notified Dr. Karilyn Cota that pt is c/o left foot pain, both feet are swollen and a bit red. Pt states this feels like gout did in the past. Order to give pain medication now. Will continue to monitor. Sheryn Bison

## 2011-12-13 NOTE — Progress Notes (Signed)
Patient ID: [name], female   DOB: [birth year], 41 y.o.   MRN: [id number] Chart reviewed. Continue diuresis. Would have oncology evaluate a possible skin biopsy or fat pad per Dr. Diona Browner. Would not consider cardiac biopsy at this time. We'll continue to follow.

## 2011-12-13 NOTE — Progress Notes (Signed)
Notified Dellis Anes,  [place] that pt refused Abdominal U/S today, but is willing to attempt in the am. Sheryn Bison

## 2011-12-13 NOTE — Progress Notes (Addendum)
     Subjective: This lady was admitted with increasing leg swelling and slight increased her breath. She feels that she has improved with less swelling of her legs. Unfortunately, I do not have an accurate input/output measurement for the last 24 hours. We did ask oncology to see her regarding the possible diagnosis of amyloidosis. Investigations are underway.           Physical Exam: Blood pressure 165/100, pulse 86, temperature 98.2 F (36.8 C), temperature source Oral, resp. rate 20, height 5\' 11"  (1.803 m), weight 180.577 kg (398 lb 1.6 oz), last menstrual period 12/01/2011, SpO2 97.00%. She is morbidly obese. She has peripheral pitting edema in her legs. Heart sounds are present and normal and irregularly irregular, consistent with atrial fibrillation. Lung fields are clear. She is alert and orientated.   Investigations:     Basic Metabolic Panel:  Basename 12/13/11 0522 12/12/11 0517  NA 138 138  K 4.1 4.0  CL 101 102  CO2 28 24  GLUCOSE 92 99  BUN 23 21  CREATININE 1.80* 1.60*  CALCIUM 9.1 9.3  MG -- --  PHOS -- --   Liver Function Tests:  Basename 12/11/11 0152 12/11/11 0148 12/10/11 2128  AST 14 -- 16  ALT 6 -- 6  ALKPHOS 106 -- 114  BILITOT 2.8* 2.8* --  PROT 8.6* -- 8.7*  ALBUMIN 3.3* -- 3.3*     CBC:  Basename 12/11/11 0152 12/10/11 2128  WBC 10.9* 10.3  NEUTROABS -- 7.9*  HGB 10.7* 11.3*  HCT 32.5* 35.1*  MCV 90.5 91.2  PLT 317 298        Medications: I have reviewed the patient's current medications.  Impression: 1. Diastolic congestive heart failure. Abnormal echocardiogram suggestive of possible infiltrative disease such as amyloidosis. 2. Uncontrolled hypertension with evidence of noncompliance. 3. Obesity. 4. Atrial fibrillation. 5. Chronic kidney disease.      Plan: 1. Continue current therapy. Add Norvasc 5 mg daily for control of blood pressure. 2. Await further investigations, today she will get ultrasound of the  abdomen to look at liver and kidneys. Also she will get blood work for the investigation of amyloidosis. Ultimately, I think we may need a cardiac biopsy.     LOS: 3 days   Wilson Singer Pager [phone]  12/13/2011, 9:57 AM

## 2011-12-14 ENCOUNTER — Inpatient Hospital Stay (HOSPITAL_COMMUNITY): Payer: Medicare Other

## 2011-12-14 DIAGNOSIS — M6530 Trigger finger, unspecified finger: Secondary | ICD-10-CM

## 2011-12-14 LAB — BASIC METABOLIC PANEL
BUN: 27 mg/dL — ABNORMAL HIGH (ref 6–23)
CO2: 28 mEq/L (ref 19–32)
GFR calc non Af Amer: 32 mL/min — ABNORMAL LOW (ref >90)
Glucose, Bld: 89 mg/dL (ref 70–99)
Potassium: 4 mEq/L (ref 3.5–5.1)

## 2011-12-14 LAB — SEDIMENTATION RATE: Sed Rate: 60 mm/h — ABNORMAL HIGH (ref 0–22)

## 2011-12-14 MED ORDER — AMLODIPINE BESYLATE 5 MG PO TABS: 10.0000 mg | ORAL_TABLET | Freq: Two times a day (BID) | ORAL | Status: DC

## 2011-12-14 MED ORDER — AMLODIPINE BESYLATE 5 MG PO TABS: 5.0000 mg | ORAL_TABLET | Freq: Two times a day (BID) | ORAL | Status: DC

## 2011-12-14 MED ORDER — AMLODIPINE BESYLATE 5 MG PO TABS
5.0000 mg | ORAL_TABLET | ORAL | Status: AC
  Administered 2011-12-14: 5 mg via ORAL
  Filled 2011-12-14: qty 1

## 2011-12-14 MED ORDER — AMLODIPINE BESYLATE 10 MG PO TABS
10.0000 mg | ORAL_TABLET | Freq: Every day | ORAL | Status: DC
  Administered 2011-12-15 – 2012-01-04 (×20): 10 mg via ORAL
  Filled 2011-12-14 (×2): qty 1
  Filled 2011-12-14 (×2): qty 2
  Filled 2011-12-14 (×3): qty 1
  Filled 2011-12-14 (×2): qty 2
  Filled 2011-12-14: qty 1
  Filled 2011-12-14: qty 2
  Filled 2011-12-14: qty 1
  Filled 2011-12-14: qty 2
  Filled 2011-12-14 (×5): qty 1
  Filled 2011-12-14 (×2): qty 2
  Filled 2011-12-14: qty 1

## 2011-12-14 NOTE — Progress Notes (Signed)
     Subjective: This lady says she feels better. She continues to lose weight. She has lost approximately 18 pounds since admission. She did not have her ultrasound of the spleen and liver yesterday, she refused. She has agreed to have it today.          Physical Exam: Blood pressure 159/89, pulse 91, temperature 99.2 F (37.3 C), temperature source Oral, resp. rate 20, height 5\' 11"  (1.803 m), weight 175.542 kg (387 lb), last menstrual period 12/01/2011, SpO2 99.00%. She is morbidly obese. She has peripheral pitting edema in her legs. Heart sounds are present and normal and irregularly irregular, consistent with atrial fibrillation. Lung fields are clear. She is alert and orientated.   Investigations:     Basic Metabolic Panel:  Basename 12/14/11 0520 12/13/11 0522  NA 137 138  K 4.0 4.1  CL 99 101  CO2 28 28  GLUCOSE 89 92  BUN 27* 23  CREATININE 1.90* 1.80*  CALCIUM 9.1 9.1  MG -- --  PHOS -- --          Medications: I have reviewed the patient's current medications.  Impression: 1. Diastolic congestive heart failure, improving. Abnormal echocardiogram suggestive of possible infiltrative disease such as amyloidosis. 2. Uncontrolled hypertension with evidence of noncompliance, slightly better control. 3. Obesity. 4. Atrial fibrillation. 5. Chronic kidney disease.      Plan: 1. Continue current therapy. Increase Norvasc to 10 mg daily. 2. Await further investigations, today she will get ultrasound of the abdomen to look at liver, spleen and kidneys. Blood work has been sent for workup of amyloidosis. Also she will get blood work for the investigation of amyloidosis. Ultimately, I think we may need a cardiac or fat pad biopsy.      LOS: 4 days   Wilson Singer Pager [phone]  12/14/2011, 10:25 AM

## 2011-12-14 NOTE — Progress Notes (Signed)
Subjective: Patient denies CP  Breathing is OK Objective: Filed Vitals:   12/13/11 0537 12/13/11 1422 12/13/11 2142 12/14/11 0604  BP: 165/100 131/91 143/80 159/89  Pulse: 86 102 96 91  Temp: 98.2 F (36.8 C) 98.9 F (37.2 C) 100.8 F (38.2 C) 99.2 F (37.3 C)  TempSrc: Oral  Oral Oral  Resp: 20 20 20 20   Height:      Weight:    387 lb (175.542 kg)  SpO2: 97% 91% 97% 99%   Weight change:   Intake/Output Summary (Last 24 hours) at 12/14/11 1037 Last data filed at 12/14/11 0942  Gross per 24 hour  Intake    480 ml  Output    300 ml  Net    180 ml   I/Os are incomplete  Tele:  Afib  AVg HR less than 100  Physical Exam: Obese 41 yo in NAD General: Alert, awake, oriented x3, in no acute distress Neck:  JVP is normal Heart: Regular rate and rhythm, without murmurs, rubs, gallops.  Lungs: Clear to auscultation.  No rales or wheezes. Exemities:  1 to 2 + edema.   Neuro: Grossly intact, nonfocal.   Lab Results: Results for orders placed during the hospital encounter of 12/10/11 (from the past 24 hour(s))  BASIC METABOLIC PANEL     Status: Abnormal   Collection Time   12/14/11  5:20 AM      Component Value Range   Sodium 137  135 - 145 mEq/L   Potassium 4.0  3.5 - 5.1 mEq/L   Chloride 99  96 - 112 mEq/L   CO2 28  19 - 32 mEq/L   Glucose, Bld 89  70 - 99 mg/dL   BUN 27 (*) 6 - 23 mg/dL   Creatinine, Ser 6.29 (*) 0.50 - 1.10 mg/dL   Calcium 9.1  8.4 - 52.8 mg/dL   GFR calc non Af Amer 32 (*) >90 mL/min   GFR calc Af Amer 37 (*) >90 mL/min  SEDIMENTATION RATE     Status: Abnormal   Collection Time   12/14/11  5:20 AM      Component Value Range   Sed Rate 60 (*) 0 - 22 mm/hr    Studies/Results: @RISRSLT24 @  Med:  Reviewed.   Patient Active Hospital Problem List:  Anasarca (12/10/2011)   Assessment:  Patient still with volume increase on exam.  Has bump in BUN/CR.  Iwould hold lasix rest of day.   Check labs in AM  Follow.  May need low dose Dopamine 2.5  mcg/kg/min. Strict I/O.  Check BNP      Cardiomegaly (12/10/2011)   Assessment: Echo with severely thickened ventricle.  ESR is elevated at 60.   For USN today to evaluate other organs.  Poss fat pad Bx.  Would not recomm myocardial bx    Accelerated hypertension (12/10/2011)   Assessment: BP is a little better this AM  Increase norvasc      Elevated serum creatinine (12/10/2011)   Assessment: Will need to follow.  Hold lasix until cr reassess in AM.  If stable resume at 80 IV bid.  May need low dose dopamine.   Would hold AM ARB.  Will need to reorder.    Atrial fibrillation (12/10/2011)   Assessment: Not on anticoag at present.    Acute on chronic diastolic heart failure (12/12/2011)   Assessment: As noted above.     QUestion sleep apnea.  Do not see as defined problem but suspicious may have.  Patient should undergo formal testing   LOS: 4 days   Dietrich Pates 12/14/2011, 10:37 AM

## 2011-12-14 NOTE — Progress Notes (Signed)
I personally reviewed and went over radiographic studies with the patient.  She may have some evidence of renal, spleen, liver amyloidosis.  Therefore, we will order a few more tests including 24 hour urine collection to evaluate for proteinuria with immunofixation.  Will also consult general surgery for consideration of fat pad biopsy to evaluate for amyloidosis.  This was reviewed with the patient.  Patient's plan discussed with Dr. Glenford Peers and Dr. Cephas Darby.

## 2011-12-14 NOTE — Progress Notes (Signed)
Subjective: The patient is seen sitting in her recliner comfortably.  She reported some increased pain in her left LE.  She was given pain medication and she reports this AM that her leg is feeling better.    Yesterday the patient declined an ultrasound of abdomen to evaluate for organomegaly/infiltrative disease.  She is willing to have this today and the order has been placed.  Pending the results of this we will need to consider biopsy of fat pad versus myocardium if ultrasound is negative.  Diagnostically, myocardium would be ideal due results of echocardiogram but will need to be discussed in detail with cardiology before this decision is made.  Again, this is if ultrasound is negative.   She denies any complaints this AM.  Objective: Vital signs in last 24 hours: Temp:  [98.9 F (37.2 C)-100.8 F (38.2 C)] 99.2 F (37.3 C) (10/18 0604) Pulse Rate:  [91-102] 91  (10/18 0604) Resp:  [20] 20  (10/18 0604) BP: (131-159)/(80-91) 159/89 mmHg (10/18 0604) SpO2:  [91 %-99 %] 99 % (10/18 0604) Weight:  [387 lb (175.542 kg)] 387 lb (175.542 kg) (10/18 0604)  Intake/Output from previous day: 10/17 0800 - 10/18 0759 In: 720 [P.O.:720] Out: 300 [Urine:300] Intake/Output this shift:   General appearance: alert, appears older than stated age, no distress and morbidly obese Extremities: edema woody infiltration edema of LE Skin: No rash appreciated.  Lab Results:  No results found for this basename: WBC:2,HGB:2,HCT:2,PLT:2 in the last 72 hours BMET  [facility] 12/14/11 0520 12/13/11 0522  NA 137 138  K 4.0 4.1  CL 99 101  CO2 28 28  GLUCOSE 89 92  BUN 27* 23  CREATININE 1.90* 1.80*  CALCIUM 9.1 9.1    Studies/Results: No results found.  Medications: I have reviewed the patient's current medications.  Assessment/Plan: 1. Moderate-sized pericardial effusion and a very thick myocardium.  Work-up for amyloidosis is underway.  SPEP with IFE, serum light chain assay tests are pending.   Urinalysis for proteinuria which is negative.  [place] of abdomen to evaluate for organomegaly and infiltration (kidneys, spleen, liver) today (patient refused this test yesterday 12/14/2011). 2. Low voltage EKG  3. Anasarca  4. Renal insufficiency  5. Folate Deficiency, started 1 mg PO daily 6. Anemia  7. Elevated BNP of over 10,000  8. Hyperproteinemia  9. Elevated total bilirubin  10. Hypoalbuminemia 11. LE edema. 12. Will continue to follow while an inpatient.     Patient and plan discussed with Dr. Glenford Peers and he is in agreement with the aforementioned.    LOS: 4 days    [name] 12/14/2011

## 2011-12-15 DIAGNOSIS — L03119 Cellulitis of unspecified part of limb: Secondary | ICD-10-CM

## 2011-12-15 DIAGNOSIS — I272 Pulmonary hypertension, unspecified: Secondary | ICD-10-CM | POA: Insufficient documentation

## 2011-12-15 DIAGNOSIS — L02419 Cutaneous abscess of limb, unspecified: Secondary | ICD-10-CM

## 2011-12-15 DIAGNOSIS — M109 Gout, unspecified: Secondary | ICD-10-CM

## 2011-12-15 DIAGNOSIS — L03116 Cellulitis of left lower limb: Secondary | ICD-10-CM | POA: Diagnosis present

## 2011-12-15 LAB — BASIC METABOLIC PANEL
CO2: 29 mEq/L (ref 19–32)
Calcium: 9 mg/dL (ref 8.4–10.5)
Chloride: 99 mEq/L (ref 96–112)
Glucose, Bld: 99 mg/dL (ref 70–99)
Potassium: 3.7 mEq/L (ref 3.5–5.1)
Sodium: 139 mEq/L (ref 135–145)

## 2011-12-15 MED ORDER — VANCOMYCIN HCL 1000 MG IV SOLR
2000.0000 mg | Freq: Once | INTRAVENOUS | Status: AC
  Administered 2011-12-15: 2000 mg via INTRAVENOUS
  Filled 2011-12-15: qty 2000

## 2011-12-15 MED ORDER — METHYLPREDNISOLONE SODIUM SUCC 40 MG IJ SOLR
40.0000 mg | Freq: Once | INTRAMUSCULAR | Status: AC
  Administered 2011-12-16: 40 mg via INTRAVENOUS
  Filled 2011-12-15: qty 1

## 2011-12-15 MED ORDER — OXYCODONE HCL 5 MG PO TABS
5.0000 mg | ORAL_TABLET | ORAL | Status: DC | PRN
  Administered 2011-12-15 – 2011-12-17 (×10): 10 mg via ORAL
  Filled 2011-12-15 (×10): qty 2

## 2011-12-15 MED ORDER — SODIUM CHLORIDE 0.9 % IV SOLN
1500.0000 mg | Freq: Two times a day (BID) | INTRAVENOUS | Status: DC
  Administered 2011-12-16 – 2011-12-17 (×3): 1500 mg via INTRAVENOUS
  Filled 2011-12-15 (×6): qty 1500

## 2011-12-15 MED ORDER — COLCHICINE 0.6 MG PO TABS
1.2000 mg | ORAL_TABLET | Freq: Once | ORAL | Status: AC
  Administered 2011-12-15: 1.2 mg via ORAL
  Filled 2011-12-15: qty 2

## 2011-12-15 MED ORDER — COLCHICINE 0.6 MG PO TABS
0.6000 mg | ORAL_TABLET | Freq: Every day | ORAL | Status: DC
  Administered 2011-12-15 – 2011-12-16 (×2): 0.6 mg via ORAL
  Filled 2011-12-15 (×2): qty 1

## 2011-12-15 MED ORDER — METHYLPREDNISOLONE SODIUM SUCC 40 MG IJ SOLR: 40.0000 mg | Freq: Four times a day (QID) | INTRAMUSCULAR | Status: DC

## 2011-12-15 NOTE — Progress Notes (Signed)
Patient was ordered foley catheter today for strict I & Os and 24 hour urine collection.  Pt refused to have catheter placed until she had relief from her gout in her feet...offered several times to place foley.  Schonewitz, Candelaria Stagers 12/15/2011

## 2011-12-15 NOTE — Progress Notes (Signed)
Chart reviewed. Discussed with Dr. Leticia Penna.  Discussed with RN. Staff has been unable to collect 24-hour urine, because patient is incontinent and refuses to get up to the commode.  Subjective: Complains of gout pain in her feet and ankles, worse in bilateral MTP joints. Has been on colchicine in the past. Has seen Dr. Romeo Apple in the past and told she had gout. No shortness of breath.   Objective: Vital signs in last 24 hours: Filed Vitals:   12/14/11 1512 12/14/11 2056 12/14/11 2100 12/15/11 0500  BP: 123/79 136/84 136/84 147/88  Pulse: 90 86 88 93  Temp: 98.5 F (36.9 C)  100 F (37.8 C) 99 F (37.2 C)  TempSrc: Oral  Oral Oral  Resp: 18  17 18   Height:      Weight:      SpO2: 96%  97% 98%   Weight change:   Intake/Output Summary (Last 24 hours) at 12/15/11 1204 Last data filed at 12/15/11 0928  Gross per 24 hour  Intake    682 ml  Output      0 ml  Net    682 ml   telemetry: Much baseline artifact.  Per tech, has been in atrial fibrillation with a rate of about 100.  General: Morbidly obese. Sitting in chair watching TV. Appears comfortable. Smells of urine. HEENT: Patient is wearing a wig. Facial hair noted. Lungs: Clear to auscultation anteriorly without wheezes rhonchi or rales Cardiovascular irregularly irregular Abdomen: Obese. Peau d'orange noted inferiorly. Extremities: Feet tender, swollen, warm, erythematous, particularly at the MTP joint, but diffusely as well. Left thigh with warmth erythema and tenderness tracking down past the knee. Peau d'orange noted on both thighs. Brawny edema noted with chronic skin changes distally.  Lab Results: Basic Metabolic Panel:  Lab 12/15/11 1610 12/14/11 0520  NA 139 137  K 3.7 4.0  CL 99 99  CO2 29 28  GLUCOSE 99 89  BUN 33* 27*  CREATININE 2.02* 1.90*  CALCIUM 9.0 9.1  MG -- --  PHOS -- --   Liver Function Tests:  Lab 12/11/11 0152 12/11/11 0148 12/10/11 2128  AST 14 -- 16  ALT 6 -- 6  ALKPHOS 106 -- 114    BILITOT 2.8* 2.8* --  PROT 8.6* -- 8.7*  ALBUMIN 3.3* -- 3.3*   No results found for this basename: LIPASE:2,AMYLASE:2 in the last 168 hours No results found for this basename: AMMONIA:2 in the last 168 hours CBC:  Lab 12/11/11 0152 12/10/11 2128  WBC 10.9* 10.3  NEUTROABS -- 7.9*  HGB 10.7* 11.3*  HCT 32.5* 35.1*  MCV 90.5 91.2  PLT 317 298   Cardiac Enzymes:  Lab 12/11/11 1332 12/11/11 0719 12/11/11 0148  CKTOTAL -- -- --  CKMB -- -- --  CKMBINDEX -- -- --  TROPONINI <0.30 <0.30 <0.30   BNP:  Lab 12/14/11 2102 12/11/11 1330  PROBNP 10792.0* 10471.0*  Hemoglobin A1C:  Lab 12/11/11 0148  HGBA1C 5.3   Fasting Lipid Panel:  Lab 12/11/11 0152  CHOL 107  HDL 21*  LDLCALC 71  TRIG 77  CHOLHDL 5.1  LDLDIRECT --   Thyroid Function Tests:  Lab 12/11/11 1330  TSH 3.610  T4TOTAL --  FREET4 --  T3FREE --  THYROIDAB --   Coagulation:  Lab 12/11/11 0152  LABPROT 17.8*  INR 1.51*   Anemia Panel:  Lab 12/11/11 0152  VITAMINB12 793  FOLATE 3.6  FERRITIN 76  TIBC 353  IRON 49  RETICCTPCT 2.0   Serum  IgG elevated at 2420  Serum IgA and IgM normal.  ESR 60   Lab 12/12/11 2047 12/11/11 0348  COLORURINE YELLOW YELLOW  LABSPEC 1.010 1.015  PHURINE 5.5 5.5  GLUCOSEU NEGATIVE NEGATIVE  HGBUR NEGATIVE LARGE*  BILIRUBINUR NEGATIVE NEGATIVE  KETONESUR NEGATIVE NEGATIVE  PROTEINUR NEGATIVE NEGATIVE  UROBILINOGEN 0.2 0.2  NITRITE NEGATIVE NEGATIVE  LEUKOCYTESUR NEGATIVE NEGATIVE    Micro Results: No results found for this or any previous visit (from the past 240 hour(s)). Studies/Results: US Abdomen Complete  12/14/2011  *RADIOLOGY REPORT*  Clinical Data:  Amyloidosis.  Evaluate for organomegaly  COMPLETE ABDOMINAL ULTRASOUND  Comparison:  Renal ultrasound, 2006  Findings:  Gallbladder:  The gallbladder is distended and shows no intraluminal stones or sludge.  The gallbladder wall appears thickened with a width of 6.1 mm and has a tri- layered  appearance suggesting the presence of wall edema.  Evaluation for a sonographic Murphy's sign is negative.  Common bile duct:  Measures 4.3 mm in diameter and has a normal appearance  Liver:  Is enlarged with a sagittal length of 24.1 cm in the midclavicular line no focal parenchymal abnormality is seen and overall echotexture is slightly diminished relative to the kidney. No signs of intrahepatic ductal dilatation are noted  IVC:  The proximal portion appears normal  Pancreas:  Is poorly visualized due to shadowing from overlying gas  Spleen:  Has a length of 14.4 cm and overall volume of 802 cc compatible with splenomegaly sonographically.  The spleen demonstrates a diffuse decrease in echotexture and this finding has been described in amyloidosis.  No focal abnormality is seen  Right Kidney:  Demonstrates a sagittal length of 13.1 cm.  A mild diffuse increase in cortical echotexture is seen with no parenchymal loss.  This may be related to amyloidosis deposition or associated with the underlying chronic renal disease.  No focal abnormalities or signs of hydronephrosis are noted  Left Kidney:  Has a sagittal length of 11.6 cm.  The overall echotexture is mildly increased with no focal parenchymal abnormality or hydronephrosis noted  Abdominal aorta:  The distal portion of the aorta is obscured by overlying gas.  The visualized portion has a maximal caliber of 2.8 cm with no aneurysmal dilatation seen  Other:  A small amount of ascites is identified in the upper abdomen.  IMPRESSION: Hepatomegaly and splenomegaly with mild decrease in echotexture suspected diffusely for both organs. 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  Slight increase in renal echotexture bilaterally.  This has been described with amyloidosis deposition but may also be secondary to the known chronic renal disease.  No focal renal abnormalities are seen.  Small amount of ascites.   Thickening of the gallbladder wall with a tri-layered pattern suggesting wall edema.  Given the presence of ascites and clinical history of anasarca, this is likely due to third spacing. In the appropriate clinical setting, this can be an indicator of acute acalculous cholecystitis.   Original Report Authenticated By: Bertha Stakes, M.D.    Scheduled Meds:   . amLODipine  10 mg Oral Daily  . amLODipine  5 mg Oral NOW  . aspirin  81 mg Oral Daily  . folic acid  1 mg Oral Daily  . heparin  5,000 Units Subcutaneous Q8H  . labetalol  400 mg Oral BID  . nitroGLYCERIN  1 inch Topical Q6H  . pantoprazole  40 mg Oral Daily  . potassium chloride  40 mEq Oral Daily  . sodium chloride  3 mL Intravenous Q12H  . sodium chloride  3 mL Intravenous Q12H  . DISCONTD: amLODipine  10 mg Oral BID   Continuous Infusions:  PRN Meds:.sodium chloride, acetaminophen, acetaminophen, hydrALAZINE, oxyCODONE, promethazine, sodium chloride, DISCONTD: oxyCODONE Assessment/Plan: Principal Problem:  *Anasarca: weight has decreased, but still with massive edema. Creatinine increasing. Lasix has been held. May need to resume. Will fluid restrict and placed on low sodium diet. Active Problems:  Accelerated hypertension, better controlled  Acute gouty arthropathy: Will give colchicine judiciously.  Acute on chronic diastolic heart failure, rule out amyloidosis: for fat pad biopsy on Monday. Will place Foley catheter, as patient is unable or unwilling to have urine collected in the commode, making 24-hour urine collection impossible. SPEP pending.  Cellulitis of left leg: Unclear if this was present on admission. We'll start on vancomycin.  Cardiomegaly  Elevated serum creatinine with chronic kidney disease, see above  Hyperbilirubinemia  Atrial fibrillation  Pulmonary hypertension, and needs outpatient polysomnogram.  Morbid obesity   LOS: 5 days   Jaydeen Darley L 12/15/2011, 12:04 PM

## 2011-12-15 NOTE — Progress Notes (Signed)
Spoke with Dr. Orvan Falconer in regards to patient temp, new orders given to obtain blood cultures

## 2011-12-15 NOTE — Consult Note (Signed)
Reason for Consult:  Suspected Amyloidosis  Referring Physician: [facility]  [name] is an 40 y.o. female.  HPI: Patient presented to [facility] with chest complaints. Her workup and evaluation was suspicious for amyloidosis. Patient has not undergone any extensive workup for definitive testing for this. Surgical consultation was obtained for possible fat biopsy.  Past Medical History  Diagnosis Date  . Essential hypertension, benign     History of accelerated hypertension with urgency  . Atrial flutter     Documented 2006 - [facility]  . Gout   . History of cardiac catheterization     Ectatic coronaries without obstruction 2006 - [facility]  . Morbid obesity   . Hypertensive heart disease     LVEF reportedly 50-55% in 2006 - [facility]  . CKD (chronic kidney disease) stage 3, GFR 30-59 ml/min     History reviewed. No pertinent past surgical history.  Family History  Problem Relation Age of Onset  . Hypertension      Social History:  reports that she has never smoked. She does not have any smokeless tobacco history on file. She reports that she does not drink alcohol or use illicit drugs.  Allergies:  Allergies  Allergen Reactions  . Penicillins Hives    Childhood allergy    Medications:  I have reviewed the patient's current medications. Prior to Admission:  Prescriptions prior to admission  Medication Sig Dispense Refill  . aspirin 81 MG tablet Take 81 mg by mouth daily.        Marland Kitchen ibuprofen (ADVIL,MOTRIN) 200 MG tablet Take 400 mg by mouth daily as needed. For pain      . labetalol (NORMODYNE) 200 MG tablet Take 200 mg by mouth 2 (two) times daily.         Scheduled:   . amLODipine  10 mg Oral Daily  . amLODipine  5 mg Oral NOW  . aspirin  81 mg Oral Daily  . folic acid  1 mg Oral Daily  . heparin  5,000 Units Subcutaneous Q8H  . labetalol  400 mg Oral BID  . nitroGLYCERIN  1 inch Topical Q6H  . pantoprazole  40 mg Oral Daily  . potassium chloride  40  mEq Oral Daily  . sodium chloride  3 mL Intravenous Q12H  . sodium chloride  3 mL Intravenous Q12H  . DISCONTD: amLODipine  10 mg Oral BID  . DISCONTD: amLODipine  5 mg Oral Daily  . DISCONTD: amLODipine  5 mg Oral BID  . DISCONTD: furosemide  60 mg Intravenous TID  . DISCONTD: irbesartan  150 mg Oral BID   Continuous:  WJX:BJYNWG chloride, acetaminophen, acetaminophen, hydrALAZINE, oxyCODONE, promethazine, sodium chloride, DISCONTD: oxyCODONE  Results for orders placed during the hospital encounter of 12/10/11 (from the past 48 hour(s))  BASIC METABOLIC PANEL     Status: Abnormal   Collection Time   12/14/11  5:20 AM      Component Value Range Comment   Sodium 137  135 - 145 mEq/L    Potassium 4.0  3.5 - 5.1 mEq/L    Chloride 99  96 - 112 mEq/L    CO2 28  19 - 32 mEq/L    Glucose, Bld 89  70 - 99 mg/dL    BUN 27 (*) 6 - 23 mg/dL    Creatinine, Ser 9.56 (*) 0.50 - 1.10 mg/dL    Calcium 9.1  8.4 - 21.3 mg/dL    GFR calc non Af Amer 32 (*) >  90 mL/min    GFR calc Af Amer 37 (*) >90 mL/min   SEDIMENTATION RATE     Status: Abnormal   Collection Time   12/14/11  5:20 AM      Component Value Range Comment   Sed Rate 60 (*) 0 - 22 mm/hr   PRO B NATRIURETIC PEPTIDE     Status: Abnormal   Collection Time   12/14/11  9:02 PM      Component Value Range Comment   Pro B Natriuretic peptide (BNP) 10792.0 (*) 0 - 125 pg/mL   BASIC METABOLIC PANEL     Status: Abnormal   Collection Time   12/15/11  5:38 AM      Component Value Range Comment   Sodium 139  135 - 145 mEq/L    Potassium 3.7  3.5 - 5.1 mEq/L    Chloride 99  96 - 112 mEq/L    CO2 29  19 - 32 mEq/L    Glucose, Bld 99  70 - 99 mg/dL    BUN 33 (*) 6 - 23 mg/dL    Creatinine, Ser 4.09 (*) 0.50 - 1.10 mg/dL    Calcium 9.0  8.4 - 81.1 mg/dL    GFR calc non Af Amer 30 (*) >90 mL/min    GFR calc Af Amer 35 (*) >90 mL/min     US Abdomen Complete  12/14/2011  *RADIOLOGY REPORT*  Clinical Data:  Amyloidosis.  Evaluate for  organomegaly  COMPLETE ABDOMINAL ULTRASOUND  Comparison:  Renal ultrasound, 2006  Findings:  Gallbladder:  The gallbladder is distended and shows no intraluminal stones or sludge.  The gallbladder wall appears thickened with a width of 6.1 mm and has a tri- layered appearance suggesting the presence of wall edema.  Evaluation for a sonographic Murphy's sign is negative.  Common bile duct:  Measures 4.3 mm in diameter and has a normal appearance  Liver:  Is enlarged with a sagittal length of 24.1 cm in the midclavicular line no focal parenchymal abnormality is seen and overall echotexture is slightly diminished relative to the kidney. No signs of intrahepatic ductal dilatation are noted  IVC:  The proximal portion appears normal  Pancreas:  Is poorly visualized due to shadowing from overlying gas  Spleen:  Has a length of 14.4 cm and overall volume of 802 cc compatible with splenomegaly sonographically.  The spleen demonstrates a diffuse decrease in echotexture and this finding has been described in amyloidosis.  No focal abnormality is seen  Right Kidney:  Demonstrates a sagittal length of 13.1 cm.  A mild diffuse increase in cortical echotexture is seen with no parenchymal loss.  This may be related to amyloidosis deposition or associated with the underlying chronic renal disease.  No focal abnormalities or signs of hydronephrosis are noted  Left Kidney:  Has a sagittal length of 11.6 cm.  The overall echotexture is mildly increased with no focal parenchymal abnormality or hydronephrosis noted  Abdominal aorta:  The distal portion of the aorta is obscured by overlying gas.  The visualized portion has a maximal caliber of 2.8 cm with no aneurysmal dilatation seen  Other:  A small amount of ascites is identified in the upper abdomen.  IMPRESSION: Hepatomegaly and splenomegaly with mild decrease in echotexture suspected diffusely for both organs. No focal abnormality is seen and these findings raise suspicion for  organ involvement with amylodosis.  Slight increase in renal echotexture bilaterally.  This has been described with amyloidosis deposition but may also be  secondary to the known chronic renal disease.  No focal renal abnormalities are seen.  Small amount of ascites.  Thickening of the gallbladder wall with a tri-layered pattern suggesting wall edema.  Given the presence of ascites and clinical history of anasarca, this is likely due to third spacing. In the appropriate clinical setting, this can be an indicator of acute acalculous cholecystitis.   Original Report Authenticated By: Bertha Stakes, M.D.     Review of Systems  Constitutional: Positive for chills and malaise/fatigue.  HENT: Negative.   Eyes: Negative.   Respiratory: Positive for cough and shortness of breath. Negative for hemoptysis and sputum production.   Cardiovascular: Positive for chest pain and leg swelling. Negative for PND.  Gastrointestinal: Negative.   Genitourinary: Negative.   Musculoskeletal: Negative.   Skin: Negative.   Neurological: Negative.   Endo/Heme/Allergies: Negative.   Psychiatric/Behavioral: Negative.    Blood pressure 147/88, pulse 93, temperature 99 F (37.2 C), temperature source Oral, resp. rate 18, height 5\' 11"  (1.803 m), weight 175.542 kg (387 lb), last menstrual period 12/01/2011, SpO2 98.00%. Physical Exam  Constitutional: She is oriented to person, place, and time. She appears well-nourished. No distress.       obese  HENT:  Head: Normocephalic and atraumatic.  Eyes: EOM are normal. Pupils are equal, round, and reactive to light.  Neck: Normal range of motion. Neck supple. No thyromegaly present.  Cardiovascular: Normal rate.  Exam reveals friction rub.   Respiratory: Effort normal and breath sounds normal.  GI: Soft. Bowel sounds are normal.  Lymphadenopathy:    She has no cervical adenopathy.  Neurological: She is alert and oriented to person, place, and time.  Skin: Skin is warm  and dry.    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 At this point I will touch bases with pathology early Monday morning to confirm their preferred tissue specimen for evaluation. I did discuss with the patient that this may be performed either as an FNA versus a true excisional biopsy of the abdominal wall. Either procedure would be considered a minor procedure and actually could be performed at the patient's bedside. I reassured the patient that the procedure itself was relatively straightforward and simple however the diagnostic aspect would be dependent on pathology is reference. In the unlikely event the patient is discharged over the weekend this may be set up as an outpatient procedure however I do suspect the patient will likely remain hospitalized to the weekend and again we'll plan to proceed on Monday with a formal biopsy.  Fusako Tanabe C 12/15/2011, 10:38 AM

## 2011-12-15 NOTE — Progress Notes (Signed)
ANTIBIOTIC CONSULT NOTE - INITIAL  Pharmacy Consult for Vancomycin Indication: cellulitis  Allergies  Allergen Reactions  . Penicillins Hives    Childhood allergy    Patient Measurements: Height: 5\' 11"  (180.3 cm) Weight: 387 lb (175.542 kg) IBW/kg (Calculated) : 70.8   Vital Signs: Temp: 99 F (37.2 C) (10/19 0500) Temp src: Oral (10/19 0500) BP: 147/88 mmHg (10/19 0500) Pulse Rate: 93  (10/19 0500) Intake/Output from previous day: 10/18 0701 - 10/19 0700 In: 342 [P.O.:342] Out: -  Intake/Output from this shift: Total I/O In: 340 [P.O.:340] Out: -   Labs:  Basename 12/15/11 0538 12/14/11 0520 12/13/11 0522  WBC -- -- --  HGB -- -- --  PLT -- -- --  LABCREA -- -- --  CREATININE 2.02* 1.90* 1.80*   Estimated Creatinine Clearance: 66.5 ml/min (by C-G formula based on Cr of 2.02). No results found for this basename: VANCOTROUGH:2,VANCOPEAK:2,VANCORANDOM:2,GENTTROUGH:2,GENTPEAK:2,GENTRANDOM:2,TOBRATROUGH:2,TOBRAPEAK:2,TOBRARND:2,AMIKACINPEAK:2,AMIKACINTROU:2,AMIKACIN:2, in the last 72 hours   Microbiology: No results found for this or any previous visit (from the past 720 hour(s)).  Medical History: Past Medical History  Diagnosis Date  . Essential hypertension, benign     History of accelerated hypertension with urgency  . Atrial flutter     Documented 2006 - [facility]  . Gout   . History of cardiac catheterization     Ectatic coronaries without obstruction 2006 - [facility]  . Morbid obesity   . Hypertensive heart disease     LVEF reportedly 50-55% in 2006 - [facility]  . CKD (chronic kidney disease) stage 3, GFR 30-59 ml/min     Medications:  Scheduled:    . amLODipine  10 mg Oral Daily  . amLODipine  5 mg Oral NOW  . aspirin  81 mg Oral Daily  . colchicine  0.6 mg Oral Daily  . colchicine  1.2 mg Oral Once  . folic acid  1 mg Oral Daily  . heparin  5,000 Units Subcutaneous Q8H  . labetalol  400 mg Oral BID  . pantoprazole  40 mg Oral Daily  . potassium  chloride  40 mEq Oral Daily  . sodium chloride  3 mL Intravenous Q12H  . sodium chloride  3 mL Intravenous Q12H  . vancomycin  1,500 mg Intravenous Q12H  . vancomycin  2,000 mg Intravenous Once  . DISCONTD: nitroGLYCERIN  1 inch Topical Q6H   Assessment: 41 yo obese F with cellulitis of the leg to start on Vancomycin.  Her renal function has been declining since admission. She is currently afebrile.   Goal of Therapy:  Vancomycin trough level 10-15 mcg/ml  Plan:  1) Vancomycin 2gm IV x1 load, then 1500mg  IV Q12h 2) Check Vancomycin trough at steady state 3) Monitor renal function   Hanh Kertesz, [facility] 12/15/2011,12:46 PM

## 2011-12-16 DIAGNOSIS — R509 Fever, unspecified: Secondary | ICD-10-CM

## 2011-12-16 LAB — PROTEIN, URINE, 24 HOUR
Collection Interval-UPROT: 24 hours
Urine Total Volume-UPROT: 1300 mL

## 2011-12-16 MED ORDER — PREDNISONE 20 MG PO TABS
20.0000 mg | ORAL_TABLET | Freq: Two times a day (BID) | ORAL | Status: DC
  Administered 2011-12-16: 20 mg via ORAL
  Filled 2011-12-16: qty 1

## 2011-12-16 MED ORDER — SODIUM CHLORIDE 0.9 % IJ SOLN
INTRAMUSCULAR | Status: AC
  Filled 2011-12-16: qty 6

## 2011-12-16 MED ORDER — PREDNISONE 10 MG PO TABS: 10.0000 mg | ORAL_TABLET | Freq: Two times a day (BID) | ORAL | Status: DC

## 2011-12-16 NOTE — Progress Notes (Signed)
Spiked a fever last night.  Blood cultures drawn. Started on steroids.  Catheter placed for 24 hour urine collection  Subjective: Gout pain much improved.  Leg less red  Objective: Vital signs in last 24 hours: Filed Vitals:   12/15/11 2225 12/15/11 2348 12/16/11 0023 12/16/11 0543  BP: 176/94   150/81  Pulse: 145   98  Temp: 102.3 F (39.1 C) 102 F (38.9 C) 99.8 F (37.7 C) 98.4 F (36.9 C)  TempSrc: Oral Oral Oral Oral  Resp: 21   19  Height:      Weight:      SpO2: 96%   98%   Weight change:   Intake/Output Summary (Last 24 hours) at 12/16/11 1502 Last data filed at 12/16/11 0548  Gross per 24 hour  Intake    236 ml  Output    550 ml  Net   -314 ml   telemetry: Much baseline artifact.  Per tech, has been in atrial fibrillation with a rate of about 90 - 100.  General: Morbidly obese. Sitting in chair watching TV. Appears comfortable. HEENT: Patient is wearing a wig. Facial hair noted. Lungs: Clear to auscultation anteriorly without wheezes rhonchi or rales Cardiovascular irregularly irregular Abdomen: Obese. Peau d'orange noted inferiorly. Extremities: Feet less warm and tender.  Left thigh less red  Lab Results: Basic Metabolic Panel:  Lab 12/15/11 1478 12/14/11 0520  NA 139 137  K 3.7 4.0  CL 99 99  CO2 29 28  GLUCOSE 99 89  BUN 33* 27*  CREATININE 2.02* 1.90*  CALCIUM 9.0 9.1  MG -- --  PHOS -- --   Liver Function Tests:  Lab 12/11/11 0152 12/11/11 0148 12/10/11 2128  AST 14 -- 16  ALT 6 -- 6  ALKPHOS 106 -- 114  BILITOT 2.8* 2.8* --  PROT 8.6* -- 8.7*  ALBUMIN 3.3* -- 3.3*   No results found for this basename: LIPASE:2,AMYLASE:2 in the last 168 hours No results found for this basename: AMMONIA:2 in the last 168 hours CBC:  Lab 12/11/11 0152 12/10/11 2128  WBC 10.9* 10.3  NEUTROABS -- 7.9*  HGB 10.7* 11.3*  HCT 32.5* 35.1*  MCV 90.5 91.2  PLT 317 298   Cardiac Enzymes:  Lab 12/11/11 1332 12/11/11 0719 12/11/11 0148  CKTOTAL -- -- --   CKMB -- -- --  CKMBINDEX -- -- --  TROPONINI <0.30 <0.30 <0.30   BNP:  Lab 12/14/11 2102 12/11/11 1330  PROBNP 10792.0* 10471.0*  Hemoglobin A1C:  Lab 12/11/11 0148  HGBA1C 5.3   Fasting Lipid Panel:  Lab 12/11/11 0152  CHOL 107  HDL 21*  LDLCALC 71  TRIG 77  CHOLHDL 5.1  LDLDIRECT --   Thyroid Function Tests:  Lab 12/11/11 1330  TSH 3.610  T4TOTAL --  FREET4 --  T3FREE --  THYROIDAB --   Coagulation:  Lab 12/11/11 0152  LABPROT 17.8*  INR 1.51*   Anemia Panel:  Lab 12/11/11 0152  VITAMINB12 793  FOLATE 3.6  FERRITIN 76  TIBC 353  IRON 49  RETICCTPCT 2.0   Serum IgG elevated at 2420  Serum IgA and IgM normal.  ESR 60   Lab 12/12/11 2047 12/11/11 0348  COLORURINE YELLOW YELLOW  LABSPEC 1.010 1.015  PHURINE 5.5 5.5  GLUCOSEU NEGATIVE NEGATIVE  HGBUR NEGATIVE LARGE*  BILIRUBINUR NEGATIVE NEGATIVE  KETONESUR NEGATIVE NEGATIVE  PROTEINUR NEGATIVE NEGATIVE  UROBILINOGEN 0.2 0.2  NITRITE NEGATIVE NEGATIVE  LEUKOCYTESUR NEGATIVE NEGATIVE    Micro Results: Recent Results (from  the past 240 hour(s))  CULTURE, BLOOD (ROUTINE X 2)     Status: Normal (Preliminary result)   Collection Time   12/15/11 10:55 PM      Component Value Range Status Comment   Specimen Description Blood BLOOD LEFT HAND   Final    Special Requests BOTTLES DRAWN AEROBIC AND ANAEROBIC 7CC   Final    Culture NO GROWTH 1 DAY   Final    Report Status PENDING   Incomplete   CULTURE, BLOOD (ROUTINE X 2)     Status: Normal (Preliminary result)   Collection Time   12/15/11 11:00 PM      Component Value Range Status Comment   Specimen Description Blood BLOOD LEFT ARM   Final    Special Requests BOTTLES DRAWN AEROBIC ONLY 6CC   Final    Culture NO GROWTH 1 DAY   Final    Report Status PENDING   Incomplete    Studies/Results: No results found. Scheduled Meds:    . amLODipine  10 mg Oral Daily  . aspirin  81 mg Oral Daily  . colchicine  0.6 mg Oral Daily  . folic acid  1  mg Oral Daily  . heparin  5,000 Units Subcutaneous Q8H  . labetalol  400 mg Oral BID  . methylPREDNISolone (SOLU-MEDROL) injection  40 mg Intravenous Once  . pantoprazole  40 mg Oral Daily  . potassium chloride  40 mEq Oral Daily  . sodium chloride  3 mL Intravenous Q12H  . sodium chloride  3 mL Intravenous Q12H  . vancomycin  1,500 mg Intravenous Q12H  . vancomycin  2,000 mg Intravenous Once  . DISCONTD: methylPREDNISolone (SOLU-MEDROL) injection  40 mg Intravenous Q6H  . DISCONTD: predniSONE  10 mg Oral BID WC  . DISCONTD: predniSONE  20 mg Oral BID WC   Continuous Infusions:  PRN Meds:.sodium chloride, acetaminophen, acetaminophen, hydrALAZINE, oxyCODONE, promethazine, sodium chloride Assessment/Plan: Principal Problem:  *Anasarca: weight has decreased, but still with massive edema. Creatinine increasing. Lasix has been held. May need to resume. Will fluid restrict and placed on low sodium diet. Active Problems:  Accelerated hypertension, better controlled  Acute gouty arthropathy: improved. D/c steroids continue once daily colchicine for now.    Acute on chronic diastolic heart failure, rule out amyloidosis: for fat pad biopsy on Monday. Will place Foley catheter, as patient is unable or unwilling to have urine collected in the commode, making 24-hour urine collection impossible. SPEP pending.  Cellulitis of left leg: improved today, but remains.  Cardiomegaly  Elevated serum creatinine with chronic kidney disease, see above  Hyperbilirubinemia  Atrial fibrillation  Pulmonary hypertension, and needs outpatient polysomnogram.  Morbid obesity Fever:  From gout most likely, but could be cellulitis.  Continue vancomycin and f/u blood cultures.  WBC would not be helpful, as steroids started last night.   LOS: 6 days   [name] 12/16/2011, 3:02 PM

## 2011-12-17 LAB — PROTEIN ELECTROPHORESIS, SERUM
Alpha-1-Globulin: 5.8 % — ABNORMAL HIGH (ref 2.9–4.9)
Beta 2: 4.5 % (ref 3.2–6.5)
Gamma Globulin: 29.7 % — ABNORMAL HIGH (ref 11.1–18.8)

## 2011-12-17 LAB — BASIC METABOLIC PANEL
Calcium: 8.5 mg/dL (ref 8.4–10.5)
GFR calc Af Amer: 36 mL/min — ABNORMAL LOW (ref >90)
GFR calc non Af Amer: 31 mL/min — ABNORMAL LOW (ref >90)
Glucose, Bld: 132 mg/dL — ABNORMAL HIGH (ref 70–99)
Potassium: 4.3 mEq/L (ref 3.5–5.1)
Sodium: 133 mEq/L — ABNORMAL LOW (ref 135–145)

## 2011-12-17 LAB — KAPPA/LAMBDA LIGHT CHAINS: Kappa free light chain: 15.4 mg/dL — ABNORMAL HIGH (ref 0.33–1.94)

## 2011-12-17 LAB — IMMUNOFIXATION ELECTROPHORESIS
IgM, Serum: 141 mg/dL (ref 52–322)
Total Protein ELP: 7.6 g/dL (ref 6.0–8.3)

## 2011-12-17 LAB — VANCOMYCIN, TROUGH: Vancomycin Tr: 35.3 ug/mL (ref 10.0–20.0)

## 2011-12-17 MED ORDER — FUROSEMIDE 10 MG/ML IJ SOLN
60.0000 mg | Freq: Two times a day (BID) | INTRAMUSCULAR | Status: DC
  Administered 2011-12-17 – 2011-12-19 (×4): 60 mg via INTRAVENOUS
  Filled 2011-12-17 (×4): qty 6

## 2011-12-17 MED ORDER — COLCHICINE 0.6 MG PO TABS
1.2000 mg | ORAL_TABLET | Freq: Once | ORAL | Status: AC
  Administered 2011-12-17: 1.2 mg via ORAL
  Filled 2011-12-17: qty 2

## 2011-12-17 MED ORDER — PREDNISONE 20 MG PO TABS
40.0000 mg | ORAL_TABLET | Freq: Two times a day (BID) | ORAL | Status: DC
  Administered 2011-12-17 – 2011-12-18 (×2): 40 mg via ORAL
  Filled 2011-12-17 (×2): qty 2

## 2011-12-17 MED ORDER — VANCOMYCIN HCL 1000 MG IV SOLR
1250.0000 mg | INTRAVENOUS | Status: DC
  Filled 2011-12-17: qty 1250

## 2011-12-17 MED ORDER — MORPHINE SULFATE 2 MG/ML IJ SOLN
2.0000 mg | INTRAMUSCULAR | Status: DC | PRN
  Administered 2011-12-17: 2 mg via INTRAVENOUS
  Administered 2011-12-17 (×2): 4 mg via INTRAVENOUS
  Administered 2011-12-17 – 2011-12-18 (×3): 2 mg via INTRAVENOUS
  Administered 2011-12-18 (×2): 4 mg via INTRAVENOUS
  Administered 2011-12-18 (×2): 2 mg via INTRAVENOUS
  Administered 2011-12-18: 4 mg via INTRAVENOUS
  Administered 2011-12-19 – 2011-12-24 (×28): 2 mg via INTRAVENOUS
  Administered 2011-12-25 – 2011-12-26 (×6): 4 mg via INTRAVENOUS
  Administered 2011-12-26 (×2): 2 mg via INTRAVENOUS
  Administered 2011-12-26: 4 mg via INTRAVENOUS
  Administered 2011-12-27 – 2011-12-28 (×7): 2 mg via INTRAVENOUS
  Administered 2011-12-28: 4 mg via INTRAVENOUS
  Administered 2011-12-28: 2 mg via INTRAVENOUS
  Administered 2011-12-28 (×2): 4 mg via INTRAVENOUS
  Administered 2011-12-29 – 2011-12-31 (×3): 2 mg via INTRAVENOUS
  Administered 2011-12-31: 4 mg via INTRAVENOUS
  Administered 2011-12-31 – 2012-01-01 (×2): 2 mg via INTRAVENOUS
  Administered 2012-01-01 – 2012-01-04 (×2): 4 mg via INTRAVENOUS
  Administered 2012-01-04: 2 mg via INTRAVENOUS
  Administered 2012-01-04: 4 mg via INTRAVENOUS
  Filled 2011-12-17: qty 1
  Filled 2011-12-17: qty 2
  Filled 2011-12-17 (×2): qty 1
  Filled 2011-12-17: qty 2
  Filled 2011-12-17 (×5): qty 1
  Filled 2011-12-17: qty 2
  Filled 2011-12-17: qty 1
  Filled 2011-12-17 (×2): qty 2
  Filled 2011-12-17: qty 1
  Filled 2011-12-17 (×2): qty 2
  Filled 2011-12-17 (×5): qty 1
  Filled 2011-12-17: qty 2
  Filled 2011-12-17 (×2): qty 1
  Filled 2011-12-17: qty 2
  Filled 2011-12-17 (×2): qty 1
  Filled 2011-12-17 (×2): qty 2
  Filled 2011-12-17: qty 1
  Filled 2011-12-17: qty 2
  Filled 2011-12-17: qty 1
  Filled 2011-12-17: qty 2
  Filled 2011-12-17 (×3): qty 1
  Filled 2011-12-17: qty 2
  Filled 2011-12-17: qty 1
  Filled 2011-12-17: qty 2
  Filled 2011-12-17 (×8): qty 1
  Filled 2011-12-17: qty 2
  Filled 2011-12-17 (×2): qty 1
  Filled 2011-12-17: qty 2
  Filled 2011-12-17: qty 1
  Filled 2011-12-17: qty 2
  Filled 2011-12-17 (×6): qty 1
  Filled 2011-12-17 (×2): qty 2
  Filled 2011-12-17 (×3): qty 1
  Filled 2011-12-17: qty 2
  Filled 2011-12-17 (×2): qty 1
  Filled 2011-12-17: qty 2
  Filled 2011-12-17 (×2): qty 1
  Filled 2011-12-17 (×2): qty 2

## 2011-12-17 MED ORDER — DOXYCYCLINE HYCLATE 100 MG PO TABS
100.0000 mg | ORAL_TABLET | Freq: Two times a day (BID) | ORAL | Status: DC
  Administered 2011-12-17 – 2011-12-24 (×14): 100 mg via ORAL
  Filled 2011-12-17 (×15): qty 1

## 2011-12-17 MED ORDER — COLCHICINE 0.6 MG PO TABS
0.6000 mg | ORAL_TABLET | Freq: Once | ORAL | Status: AC
  Administered 2011-12-17: 0.6 mg via ORAL
  Filled 2011-12-17: qty 2

## 2011-12-17 MED ORDER — PREDNISONE 20 MG PO TABS
40.0000 mg | ORAL_TABLET | Freq: Once | ORAL | Status: AC
  Administered 2011-12-17: 40 mg via ORAL
  Filled 2011-12-17: qty 2

## 2011-12-17 NOTE — Progress Notes (Signed)
ANTIBIOTIC CONSULT NOTE  Pharmacy Consult for Vancomycin Indication: cellulitis  Allergies  Allergen Reactions  . Penicillins Hives    Childhood allergy    Patient Measurements: Height: 5\' 11"  (180.3 cm) Weight: 387 lb (175.542 kg) IBW/kg (Calculated) : 70.8   Vital Signs: Temp: 98 F (36.7 C) (10/21 0523) Temp src: Oral (10/21 0523) BP: 129/82 mmHg (10/21 0523) Pulse Rate: 94  (10/21 0523) Intake/Output from previous day: 10/20 0701 - 10/21 0700 In: 1060 [P.O.:1060] Out: 675 [Urine:675] Intake/Output from this shift: Total I/O In: 240 [P.O.:240] Out: -   Labs:  Basename 12/17/11 0937 12/15/11 0538  WBC -- --  HGB -- --  PLT -- --  LABCREA -- --  CREATININE 1.96* 2.02*   Estimated Creatinine Clearance: 68.6 ml/min (by C-G formula based on Cr of 1.96).  Basename 12/17/11 1146  VANCOTROUGH 35.3*  VANCOPEAK --  Drue Dun --  GENTTROUGH --  GENTPEAK --  GENTRANDOM --  TOBRATROUGH --  TOBRAPEAK --  TOBRARND --  AMIKACINPEAK --  AMIKACINTROU --  AMIKACIN --     Microbiology: Recent Results (from the past 720 hour(s))  CULTURE, BLOOD (ROUTINE X 2)     Status: Normal (Preliminary result)   Collection Time   12/15/11 10:55 PM      Component Value Range Status Comment   Specimen Description Blood BLOOD LEFT HAND   Final    Special Requests BOTTLES DRAWN AEROBIC AND ANAEROBIC 7CC   Final    Culture NO GROWTH 2 DAYS   Final    Report Status PENDING   Incomplete   CULTURE, BLOOD (ROUTINE X 2)     Status: Normal (Preliminary result)   Collection Time   12/15/11 11:00 PM      Component Value Range Status Comment   Specimen Description Blood BLOOD LEFT ARM   Final    Special Requests BOTTLES DRAWN AEROBIC ONLY 6CC   Final    Culture NO GROWTH 2 DAYS   Final    Report Status PENDING   Incomplete     Medical History: Past Medical History  Diagnosis Date  . Essential hypertension, benign     History of accelerated hypertension with urgency  . Atrial  flutter     Documented 2006 - [facility]  . Gout   . History of cardiac catheterization     Ectatic coronaries without obstruction 2006 - [facility]  . Morbid obesity   . Hypertensive heart disease     LVEF reportedly 50-55% in 2006 - [facility]  . CKD (chronic kidney disease) stage 3, GFR 30-59 ml/min     Medications:  Scheduled:     . amLODipine  10 mg Oral Daily  . aspirin  81 mg Oral Daily  . colchicine  1.2 mg Oral Once  . folic acid  1 mg Oral Daily  . labetalol  400 mg Oral BID  . pantoprazole  40 mg Oral Daily  . potassium chloride  40 mEq Oral Daily  . predniSONE  40 mg Oral Once  . sodium chloride  3 mL Intravenous Q12H  . sodium chloride  3 mL Intravenous Q12H  . sodium chloride      . vancomycin  1,250 mg Intravenous Q24H  . DISCONTD: colchicine  0.6 mg Oral Daily  . DISCONTD: heparin  5,000 Units Subcutaneous Q8H  . DISCONTD: predniSONE  10 mg Oral BID WC  . DISCONTD: predniSONE  20 mg Oral BID WC  . DISCONTD: vancomycin  1,500 mg Intravenous Q12H  Assessment: 41 yo obese F with cellulitis vs gout of the leg currently on day#3 Vancomycin.   Her renal function is not at her baseline, but seems to have stabilized. She is currently afebrile & clinically improved. Vancomycin trough level is elevated.    Goal of Therapy:  Vancomycin trough level 10-15 mcg/ml  Plan:  1) Hold Vancomycin today then resume at decreased dose of Vancomycin 1250 mg IV Q24h tomorrow 2) Recheck Vancomycin trough at steady state 3) Monitor renal function & cx data  [name] 12/17/2011,2:17 PM

## 2011-12-17 NOTE — Progress Notes (Signed)
Discussed with [name] Subjective: Gout pain in left ankle and first MTP worse today. Started this morning. She tells me that the fat pad biopsy will be tomorrow, not today.  Objective: Vital signs in last 24 hours: Filed Vitals:   12/16/11 1507 12/16/11 2231 12/17/11 0523 12/17/11 1400  BP: 134/70 127/69 129/82 148/96  Pulse: 91 85 94 91  Temp: 98.1 F (36.7 C) 98.5 F (36.9 C) 98 F (36.7 C) 97.7 F (36.5 C)  TempSrc: Oral Oral Oral Oral  Resp: 18 19 19 18   Height:      Weight:      SpO2: 97% 96% 99% 96%   Weight change:   Intake/Output Summary (Last 24 hours) at 12/17/11 1626 Last data filed at 12/17/11 1200  Gross per 24 hour  Intake   1060 ml  Output    275 ml  Net    785 ml   telemetry: Much baseline artifact.  Per tech, has been in atrial fibrillation with a rate of about 90 - 100.  General: In bed. Uncomfortable. HEENT: Patient is wearing a wig. Facial hair noted. Lungs: Clear to auscultation anteriorly without wheezes rhonchi or rales Cardiovascular irregularly irregular Abdomen: Obese. Peau d'orange noted inferiorly. Extremities: Left ankle and foot tender, less red than previous. Indurated. Decreased range of motion. Left thigh cellulitis continues to improve.  Lab Results: Basic Metabolic Panel:  Lab 12/17/11 4098 12/15/11 0538  NA 133* 139  K 4.3 3.7  CL 94* 99  CO2 27 29  GLUCOSE 132* 99  BUN 46* 33*  CREATININE 1.96* 2.02*  CALCIUM 8.5 9.0  MG -- --  PHOS -- --   Liver Function Tests:  Lab 12/11/11 0152 12/11/11 0148 12/10/11 2128  AST 14 -- 16  ALT 6 -- 6  ALKPHOS 106 -- 114  BILITOT 2.8* 2.8* --  PROT 8.6* -- 8.7*  ALBUMIN 3.3* -- 3.3*   No results found for this basename: LIPASE:2,AMYLASE:2 in the last 168 hours No results found for this basename: AMMONIA:2 in the last 168 hours CBC:  Lab 12/11/11 0152 12/10/11 2128  WBC 10.9* 10.3  NEUTROABS -- 7.9*  HGB 10.7* 11.3*  HCT 32.5* 35.1*  MCV 90.5 91.2  PLT 317 298   Cardiac  Enzymes:  Lab 12/11/11 1332 12/11/11 0719 12/11/11 0148  CKTOTAL -- -- --  CKMB -- -- --  CKMBINDEX -- -- --  TROPONINI <0.30 <0.30 <0.30   BNP:  Lab 12/14/11 2102 12/11/11 1330  PROBNP 10792.0* 10471.0*  Hemoglobin A1C:  Lab 12/11/11 0148  HGBA1C 5.3   Fasting Lipid Panel:  Lab 12/11/11 0152  CHOL 107  HDL 21*  LDLCALC 71  TRIG 77  CHOLHDL 5.1  LDLDIRECT --   Thyroid Function Tests:  Lab 12/11/11 1330  TSH 3.610  T4TOTAL --  FREET4 --  T3FREE --  THYROIDAB --   Coagulation:  Lab 12/11/11 0152  LABPROT 17.8*  INR 1.51*   Anemia Panel:  Lab 12/11/11 0152  VITAMINB12 793  FOLATE 3.6  FERRITIN 76  TIBC 353  IRON 49  RETICCTPCT 2.0   Serum IgG elevated at 2420  Serum IgA and IgM normal.  ESR 60  Or free light chain extremely elevated at 15.4.  Lambda free light chains 6.7  Kappa/Lambda light chain ratio 2.3  M spike not detected  24 hour urine protein 884 mg  Lab 12/12/11 2047 12/11/11 0348  COLORURINE YELLOW YELLOW  LABSPEC 1.010 1.015  PHURINE 5.5 5.5  GLUCOSEU NEGATIVE  NEGATIVE  HGBUR NEGATIVE LARGE*  BILIRUBINUR NEGATIVE NEGATIVE  KETONESUR NEGATIVE NEGATIVE  PROTEINUR NEGATIVE NEGATIVE  UROBILINOGEN 0.2 0.2  NITRITE NEGATIVE NEGATIVE  LEUKOCYTESUR NEGATIVE NEGATIVE    Micro Results: Recent Results (from the past 240 hour(s))  CULTURE, BLOOD (ROUTINE X 2)     Status: Normal (Preliminary result)   Collection Time   12/15/11 10:55 PM      Component Value Range Status Comment   Specimen Description Blood BLOOD LEFT HAND   Final    Special Requests BOTTLES DRAWN AEROBIC AND ANAEROBIC 7CC   Final    Culture NO GROWTH 2 DAYS   Final    Report Status PENDING   Incomplete   CULTURE, BLOOD (ROUTINE X 2)     Status: Normal (Preliminary result)   Collection Time   12/15/11 11:00 PM      Component Value Range Status Comment   Specimen Description Blood BLOOD LEFT ARM   Final    Special Requests BOTTLES DRAWN AEROBIC ONLY 6CC   Final     Culture NO GROWTH 2 DAYS   Final    Report Status PENDING   Incomplete    Studies/Results: No results found. Scheduled Meds:    . amLODipine  10 mg Oral Daily  . aspirin  81 mg Oral Daily  . colchicine  0.6 mg Oral Once  . colchicine  1.2 mg Oral Once  . folic acid  1 mg Oral Daily  . furosemide  60 mg Intravenous BID  . labetalol  400 mg Oral BID  . pantoprazole  40 mg Oral Daily  . potassium chloride  40 mEq Oral Daily  . predniSONE  40 mg Oral Once  . sodium chloride  3 mL Intravenous Q12H  . sodium chloride  3 mL Intravenous Q12H  . sodium chloride      . vancomycin  1,250 mg Intravenous Q24H  . DISCONTD: colchicine  0.6 mg Oral Daily  . DISCONTD: vancomycin  1,500 mg Intravenous Q12H   Continuous Infusions:  PRN Meds:.sodium chloride, acetaminophen, acetaminophen, hydrALAZINE, oxyCODONE, promethazine, sodium chloride Assessment/Plan: Principal Problem:  *Anasarca: Agree with resuming Lasix. Active Problems:  Accelerated hypertension, better controlled  Acute gouty arthropathy: Worse today in the left ankle and foot. Received 1.2 mg of colchicine this morning and the prednisone was resumed. Would not give nonsteroidal anti-inflammatories in the setting of chronic kidney disease, possible amyloidosis, continued requirement for diuretics despite marginal renal function.  Acute on chronic diastolic heart failure, rule out amyloidosis: for fat pad biopsy on Tomorrow. Has elevated light chains. No M spike noted. Will order I FE. Will discuss results with hematology.  Cellulitis of left leg: improving. Change to by mouth antibiotics.  Cardiomegaly  Elevated serum creatinine with chronic kidney disease, see above  Hyperbilirubinemia  Atrial fibrillation  Pulmonary hypertension, and needs outpatient polysomnogram.  Morbid obesity No further fevers.   LOS: 7 days   [name] L 12/17/2011, 4:26 PM

## 2011-12-17 NOTE — Progress Notes (Signed)
  Subjective: No acute change.  Objective: Vital signs in last 24 hours: Temp:  [97.7 F (36.5 C)-98.5 F (36.9 C)] 97.7 F (36.5 C) (10/21 1400) Pulse Rate:  [85-94] 91  (10/21 1400) Resp:  [18-19] 18  (10/21 1400) BP: (127-148)/(69-96) 148/96 mmHg (10/21 1400) SpO2:  [96 %-99 %] 96 % (10/21 1400) Last BM Date: 12/13/11  Intake/Output from previous day: 10/20 0701 - 10/21 0700 In: 1060 [P.O.:1060] Out: 675 [Urine:675] Intake/Output this shift:    General appearance: alert and no distress  Lab Results:  No results found for this basename: WBC:2,HGB:2,HCT:2,PLT:2 in the last 72 hours BMET  [facility] 12/17/11 0937 12/15/11 0538  NA 133* 139  K 4.3 3.7  CL 94* 99  CO2 27 29  GLUCOSE 132* 99  BUN 46* 33*  CREATININE 1.96* 2.02*  CALCIUM 8.5 9.0   PT/INR No results found for this basename: LABPROT:2,INR:2 in the last 72 hours ABG No results found for this basename: PHART:2,PCO2:2,PO2:2,HCO3:2 in the last 72 hours  Studies/Results: No results found.  Anti-infectives: Anti-infectives     Start     Dose/Rate Route Frequency Ordered Stop   12/18/11 0600   vancomycin (VANCOCIN) 1,250 mg in sodium chloride 0.9 % 250 mL IVPB  Status:  Discontinued        1,250 mg 166.7 mL/hr over 90 Minutes Intravenous Every 24 hours 12/17/11 1416 12/17/11 1627   12/17/11 2200   doxycycline (VIBRA-TABS) tablet 100 mg        100 mg Oral Every 12 hours 12/17/11 1629     12/16/11 0100   vancomycin (VANCOCIN) 1,500 mg in sodium chloride 0.9 % 500 mL IVPB  Status:  Discontinued        1,500 mg 250 mL/hr over 120 Minutes Intravenous Every 12 hours 12/15/11 1226 12/17/11 1416   12/15/11 1300   vancomycin (VANCOCIN) 2,000 mg in sodium chloride 0.9 % 500 mL IVPB        2,000 mg 250 mL/hr over 120 Minutes Intravenous  Once 12/15/11 1226 12/15/11 1659          Assessment/Plan: s/p * No surgery found * 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  Patient needs biopsy for definitive diagnosis.  After  talking to the pathologist, the preferred sample would be an excisional biopsy.  Discussed with the patient and will plan to proceed tomorrow.  LOS: 7 days    [name] C 12/17/2011

## 2011-12-17 NOTE — Progress Notes (Signed)
Subjective:  Complains of Leg swelling and gout pain  Objective:  Vital Signs in the last 24 hours: Temp:  [98 F (36.7 C)-98.5 F (36.9 C)] 98 F (36.7 C) (10/21 0523) Pulse Rate:  [85-94] 94  (10/21 0523) Resp:  [18-19] 19  (10/21 0523) BP: (127-134)/(69-82) 129/82 mmHg (10/21 0523) SpO2:  [96 %-99 %] 99 % (10/21 0523)  Intake/Output from previous day: 10/20 0701 - 10/21 0700 In: 1060 [P.O.:1060] Out: 675 [Urine:675] Intake/Output from this shift: Total I/O In: 240 [P.O.:240] Out: -   Physical Exam: NECK: Without JVD, HJR, or bruit LUNGS: decreased breath sounds with few crackles at the bases HEART: Irregular rate and rhythm, no murmur, gallop, rub, bruit, thrill, or heave EXTREMITIES: anasarca with severe edema bilaterally, morbidly obese   Lab Results: No results found for this basename: WBC:2,HGB:2,PLT:2 in the last 72 hours  Basename 12/17/11 0937 12/15/11 0538  NA 133* 139  K 4.3 3.7  CL 94* 99  CO2 27 29  GLUCOSE 132* 99  BUN 46* 33*  CREATININE 1.96* 2.02*   No results found for this basename: TROPONINI:2,CK,MB:2 in the last 72 hours Hepatic Function Panel No results found for this basename: PROT,ALBUMIN,AST,ALT,ALKPHOS,BILITOT,BILIDIR,IBILI in the last 72 hours No results found for this basename: CHOL in the last 72 hours No results found for this basename: PROTIME in the last 72 hours  Imaging:   Cardiac Studies:  Assessment/Plan:  1. Significant volume overload with anasarca, likely acute on chronic diastolic heart failure in the setting of poorly controlled hypertension. Echocardiogram also shows severe LVH with echodense myocardium and features suggesting the possibility of an infiltrative disorder such as amyloidosis. This would be also be supported by low voltage ECG, although certainly her body habitus may also be playing a role. Weight has decreased with diuresis, intake and output was not completely calculated yesterday. For fat biopsy. I/o's  positive since Lasix held over the weekend(was on 60mg  IV tid). Will resume 60mg  BID to start.  2. Hypertension,better control.   3. Morbid obesity.  4. Chronic kidney disease, stage III.  5. Atrial fibrillation, duration uncertain. Not anticoagulated at this point.    LOS: 7 days    Jacolyn Reedy 12/17/2011, 1:08 PM  Cardiology attending  Patient seen and examined. I have reviewed the findings as noted above. I agree with these as amended. Her main complaint today involves gout pain in her left foot. Would strongly consider adding intravenous Solu-Medrol, and continue/ restart NSAIDs. With regard to her volume overload and renal insufficiency, agree with starting Lasix back. Her massive obesity, heart failure, renal insufficiency, and atrial fibrillation all suggest a very poor long-term prognosis.  Sharrell Ku, M.D.

## 2011-12-18 ENCOUNTER — Encounter (HOSPITAL_COMMUNITY)
Admission: EM | Disposition: A | Discharge status: Nursing Home (Includes ICF) | Payer: Self-pay | Source: Home / Self Care | Attending: Internal Medicine

## 2011-12-18 ENCOUNTER — Encounter (HOSPITAL_COMMUNITY): Payer: Self-pay | Admitting: *Deleted

## 2011-12-18 HISTORY — PX: SKIN BIOPSY: SHX1

## 2011-12-18 SURGERY — BIOPSY, SKIN
Anesthesia: LOCAL | Wound class: Clean

## 2011-12-18 MED ORDER — COLCHICINE 0.6 MG PO TABS
0.6000 mg | ORAL_TABLET | Freq: Every day | ORAL | Status: DC
  Administered 2011-12-18 – 2012-01-04 (×18): 0.6 mg via ORAL
  Filled 2011-12-18 (×18): qty 1

## 2011-12-18 MED ORDER — LIDOCAINE HCL (PF) 1 % IJ SOLN
INTRAMUSCULAR | Status: AC
  Filled 2011-12-18: qty 30

## 2011-12-18 MED ORDER — LIDOCAINE HCL (PF) 1 % IJ SOLN
INTRAMUSCULAR | Status: DC | PRN
  Administered 2011-12-18: 10 mg

## 2011-12-18 MED ORDER — HYDROCODONE-ACETAMINOPHEN 5-325 MG PO TABS
1.0000 | ORAL_TABLET | ORAL | Status: DC | PRN
  Administered 2011-12-20 – 2011-12-24 (×12): 1 via ORAL
  Filled 2011-12-18 (×12): qty 1

## 2011-12-18 MED ORDER — HEPARIN SODIUM (PORCINE) 5000 UNIT/ML IJ SOLN
5000.0000 [IU] | Freq: Three times a day (TID) | INTRAMUSCULAR | Status: DC
  Administered 2011-12-18 – 2011-12-26 (×23): 5000 [IU] via SUBCUTANEOUS
  Filled 2011-12-18 (×27): qty 1

## 2011-12-18 MED ORDER — HEPARIN SODIUM (PORCINE) 1000 UNIT/ML IJ SOLN: 5000.0000 [IU] | Freq: Every morning | INTRAMUSCULAR | Status: DC

## 2011-12-18 MED ORDER — PREDNISONE 20 MG PO TABS
40.0000 mg | ORAL_TABLET | Freq: Every day | ORAL | Status: DC
  Administered 2011-12-19 – 2011-12-22 (×4): 40 mg via ORAL
  Filled 2011-12-18 (×4): qty 2

## 2011-12-18 NOTE — Progress Notes (Signed)
Subjective: Left ankle and foot gout pain improved.  Procedure went ok. Able to bear weight on the foot, but does not want the Foley catheter removed, because she is afraid she will be unable to get out of bed quickly enough.  Objective: Vital signs in last 24 hours: Filed Vitals:   12/18/11 0617 12/18/11 1346 12/18/11 1458 12/18/11 1636  BP: 142/91 144/101 136/78   Pulse: 112 101 96 90  Temp: 100.7 F (38.2 C) 99 F (37.2 C) 98.6 F (37 C)   TempSrc: Oral Oral Oral   Resp: 20 26 20    Height:      Weight:      SpO2: 94% 92% 96% 97%   Weight change:   Intake/Output Summary (Last 24 hours) at 12/18/11 1737 Last data filed at 12/18/11 1726  Gross per 24 hour  Intake   1210 ml  Output   1650 ml  Net   -440 ml   telemetry: Much baseline artifact.  Per tech, has been in atrial fibrillation with a rate of about 90 - 100.  General: In chair. More comfortable. HEENT: Patient is wearing a wig. Facial hair noted. Lungs: Clear to auscultation anteriorly without wheezes rhonchi or rales Cardiovascular irregularly irregular Abdomen: Obese. Peau d'orange noted inferiorly. Extremities: Left ankle and foot less tender. Indurated.  Left thigh cellulitis continues to improve.  Lab Results: Basic Metabolic Panel:  Lab 12/17/11 1610 12/15/11 0538  NA 133* 139  K 4.3 3.7  CL 94* 99  CO2 27 29  GLUCOSE 132* 99  BUN 46* 33*  CREATININE 1.96* 2.02*  CALCIUM 8.5 9.0  MG -- --  PHOS -- --   Liver Function Tests: No results found for this basename: AST:2,ALT:2,ALKPHOS:2,BILITOT:2,PROT:2,ALBUMIN:2 in the last 168 hours No results found for this basename: LIPASE:2,AMYLASE:2 in the last 168 hours No results found for this basename: AMMONIA:2 in the last 168 hours CBC: No results found for this basename: WBC:2,NEUTROABS:2,HGB:2,HCT:2,MCV:2,PLT:2 in the last 168 hours Cardiac Enzymes: No results found for this basename: CKTOTAL:3,CKMB:3,CKMBINDEX:3,TROPONINI:3 in the last 168  hours BNP:  Lab 12/14/11 2102  PROBNP 10792.0*  Hemoglobin A1C: No results found for this basename: HGBA1C in the last 168 hours Fasting Lipid Panel: No results found for this basename: CHOL,HDL,LDLCALC,TRIG,CHOLHDL,LDLDIRECT in the last 960 hours Thyroid Function Tests: No results found for this basename: TSH,T4TOTAL,FREET4,T3FREE,THYROIDAB in the last 168 hours Coagulation: No results found for this basename: LABPROT:4,INR:4 in the last 168 hours Anemia Panel: No results found for this basename: VITAMINB12,FOLATE,FERRITIN,TIBC,IRON,RETICCTPCT in the last 168 hours Serum IgG elevated at 2420  Serum IgA and IgM normal.  ESR 60  Or free light chain extremely elevated at 15.4.  Lambda free light chains 6.7  Kappa/Lambda light chain ratio 2.3  M spike not detected  24 hour urine protein 884 mg  Lab 12/12/11 2047  COLORURINE YELLOW  LABSPEC 1.010  PHURINE 5.5  GLUCOSEU NEGATIVE  HGBUR NEGATIVE  BILIRUBINUR NEGATIVE  KETONESUR NEGATIVE  PROTEINUR NEGATIVE  UROBILINOGEN 0.2  NITRITE NEGATIVE  LEUKOCYTESUR NEGATIVE   PROTEIN ELP 24 HR    Time      24     Volume, Urine      1300     Total Protein, Urine      152.2      Total Protein, Urine-Ur/day      1979      Albumin, U      PENDING     Alpha 1, Urine      PENDING  Alpha 2, Urine      PENDING     Beta, Urine      PENDING     Gamma Globulin, Urine      PENDING     Free Kappa Lt Chains,Ur      132.00     Free Lt Chn Excr Rate      1716.00     Free Lambda Lt Chains,Ur      10.90     Free Lambda Excretion/Day      141.70     Free Kappa/Lambda Ratio      12.11      Immunofixation, Urine      PENDING      Micro Results: Recent Results (from the past 240 hour(s))  CULTURE, BLOOD (ROUTINE X 2)     Status: Normal (Preliminary result)   Collection Time   12/15/11 10:55 PM      Component Value Range Status Comment   Specimen Description BLOOD LEFT HAND   Final    Special Requests BOTTLES DRAWN AEROBIC AND  ANAEROBIC 7CC   Final    Culture NO GROWTH 3 DAYS   Final    Report Status PENDING   Incomplete   CULTURE, BLOOD (ROUTINE X 2)     Status: Normal (Preliminary result)   Collection Time   12/15/11 11:00 PM      Component Value Range Status Comment   Specimen Description BLOOD LEFT ARM   Final    Special Requests BOTTLES DRAWN AEROBIC ONLY 6CC   Final    Culture NO GROWTH 3 DAYS   Final    Report Status PENDING   Incomplete    Studies/Results: No results found. Scheduled Meds:    . amLODipine  10 mg Oral Daily  . aspirin  81 mg Oral Daily  . doxycycline  100 mg Oral Q12H  . folic acid  1 mg Oral Daily  . furosemide  60 mg Intravenous BID  . labetalol  400 mg Oral BID  . pantoprazole  40 mg Oral Daily  . potassium chloride  40 mEq Oral Daily  . predniSONE  40 mg Oral BID WC  . sodium chloride  3 mL Intravenous Q12H  . sodium chloride  3 mL Intravenous Q12H   Continuous Infusions:  PRN Meds:.acetaminophen, acetaminophen, hydrALAZINE, HYDROcodone-acetaminophen, lidocaine, morphine injection, promethazine, sodium chloride, DISCONTD: sodium chloride Assessment/Plan: Principal Problem:  *Anasarca: Agree with resuming Lasix. Active Problems:  Accelerated hypertension, better controlled  Acute gouty arthropathy: Better on colchicine and prednisone. Would not give nonsteroidal anti-inflammatories in the setting of chronic kidney disease, possible amyloidosis, continued requirement for diuretics despite marginal renal function.  Acute on chronic diastolic heart failure, rule out amyloidosis: fat pad biopsy results pending. Has elevated light chains. No M spike noted.  IFE pending.  Cellulitis of left leg: improving. Change to by mouth antibiotics.  Cardiomegaly  Elevated serum creatinine with chronic kidney disease, see above  Hyperbilirubinemia  Atrial fibrillation  Pulmonary hypertension, and needs outpatient polysomnogram.  Morbid obesity No further fevers. Hopefully home soon if  stable. Not yet ambulatory enough   LOS: 8 days   Jahred Tatar L 12/18/2011, 5:37 PM

## 2011-12-18 NOTE — Op Note (Signed)
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 Tilford Pillar  Anes:  1% lidocaine for local anesthetic.  Indications:  Patient is a 41 year old female multiple medical problems who presented to [facility] with what is suspected to be an exacerbation of amyloidosis. This is her current working diagnoses although definitive testing has not been performed to date. Surgical consultation was obtained to obtain a biopsy of the subcutaneous adipose tissue to further evaluate for the possible presence of amyloid deposits. Risks benefits alternatives were discussed with the patient and patient was consented for the planned procedure.  Procedure note:  Patient was placed in the minor procedure room. Her abdomen was prepped with Betadine solution and draped in standard fashion. Local anesthetic was instilled at the site of planned excision. An elliptical skin incision was created with a 15 blade scalpel. Additional dissection was carried down through the subcuticular tissue including abdominal wall on the post tissue. The plug of tissue was taken en bloc and was sent as a permanent specimen to pathology. Hemostasis was good. Additional hemostasis was obtained with closure. I did use a 3-0 Prolene suture in simple interrupted fashion as well as a running suture to reapproximate the skin edges of the abdominal wall. The skin was washed and dried. A sterile dressing was placed. Patient tolerated the procedure extremely well. All sharps were disposed of in proper accordance.  Complications:  None  EBL:  Minimal  Specimen:  Skin/soft tissue/adipose tissue.

## 2011-12-18 NOTE — Progress Notes (Signed)
Patient ID: [name], female   DOB: [birth year], 41 y.o.   MRN: [id number] Subjective:  Low grade temp this am  Swelling chronic  Objective:  Vital Signs in the last 24 hours: Temp:  [97.7 F (36.5 C)-100.7 F (38.2 C)] 100.7 F (38.2 C) (10/22 0617) Pulse Rate:  [91-118] 112  (10/22 0617) Resp:  [18-20] 20  (10/22 0617) BP: (142-185)/(90-124) 142/91 mmHg (10/22 0617) SpO2:  [92 %-96 %] 94 % (10/22 0617)  Intake/Output from previous day: 10/21 0701 - 10/22 0700 In: 1080 [P.O.:1080] Out: 1100 [Urine:1100] Intake/Output from this shift:    Physical Exam: NECK: Without JVD, HJR, or bruit LUNGS: decreased breath sounds with few crackles at the bases HEART: Irregular rate and rhythm, no murmur, gallop, rub, bruit, thrill, or heave EXTREMITIES: anasarca with severe edema bilaterally, morbidly obese Venous skin break down lateral aspect left leg   Lab Results: No results found for this basename: WBC:2,HGB:2,PLT:2 in the last 72 hours  Basename 12/17/11 0937  NA 133*  K 4.3  CL 94*  CO2 27  GLUCOSE 132*  BUN 46*  CREATININE 1.96*    Imaging:   Cardiac Studies:  Assessment/Plan:  1. Significant volume overload with anasarca, likely acute on chronic diastolic heart failure in the setting of poorly controlled hypertension. Echocardiogram also shows severe LVH with echodense myocardium and features suggesting the possibility of an infiltrative disorder such as amyloidosis. This would be also be supported by low voltage ECG, although certainly her body habitus may also be playing a role. Weight has decreased with diuresis, intake and output was not completely calculated yesterday. For fat biopsy. I/o's positive since Lasix 60 bid iv restarted yesterday.  To big for MRI scanner regarding amyloid.  Would consider asking renal about ultrafiltration  Fever down with tylenol  Suspect she will be ok for fat pad biopsy today  2. Hypertension,better control.   3. Morbid  obesity.  4. Chronic kidney disease, stage III.  5. Atrial fibrillation, duration uncertain. Not anticoagulated at this point.    LOS: 8 days    Charlton Haws 12/18/2011, 7:46 AM  Cardiology attending  Patient seen and examined. I have reviewed the findings as noted above. I agree with these as amended. Her main complaint today involves gout pain in her left foot. Would strongly consider adding intravenous Solu-Medrol, and continue/ restart NSAIDs. With regard to her volume overload and renal insufficiency, agree with starting Lasix back. Her massive obesity, heart failure, renal insufficiency, and atrial fibrillation all suggest a very poor long-term prognosis.  Sharrell Ku, M.D.

## 2011-12-19 LAB — BASIC METABOLIC PANEL
BUN: 68 mg/dL — ABNORMAL HIGH (ref 6–23)
CO2: 26 mEq/L (ref 19–32)
Calcium: 8.9 mg/dL (ref 8.4–10.5)
Creatinine, Ser: 2.43 mg/dL — ABNORMAL HIGH (ref 0.50–1.10)
GFR calc non Af Amer: 24 mL/min — ABNORMAL LOW (ref >90)
Glucose, Bld: 138 mg/dL — ABNORMAL HIGH (ref 70–99)
Sodium: 133 mEq/L — ABNORMAL LOW (ref 135–145)

## 2011-12-19 LAB — UIFE/LIGHT CHAINS/TP QN, 24-HR UR
Albumin, U: DETECTED
Alpha 1, Urine: DETECTED — AB
Free Kappa/Lambda Ratio: 12.11 ratio — ABNORMAL HIGH (ref 2.04–10.37)
Free Lambda Excretion/Day: 141.7 mg/d
Free Lambda Lt Chains,Ur: 10.9 mg/dL — ABNORMAL HIGH (ref 0.02–0.67)
Total Protein, Urine-Ur/day: 1979 mg/d — ABNORMAL HIGH (ref 10–140)
Total Protein, Urine: 152.2 mg/dL
Volume, Urine: 1300 mL

## 2011-12-19 NOTE — Progress Notes (Signed)
Patient: [name] Date of Encounter: 12/19/2011, 9:00 AM Admit date: 12/10/2011     Subjective  Still c/o gout L leg. She feels like she may need another dose of Solu Medrol instead of prednisone. No SOB, chest pain.   Objective   Telemetry: afib rates 90s-110 Physical Exam: Filed Vitals:   12/19/11 0559  BP: 145/89  Pulse: 96  Temp: 98 F (36.7 C)  Resp: 17  pox 96% RA General: Well developed morbidly obese AAF in no acute distress. Head: Normocephalic, atraumatic, sclera non-icteric, no xanthomas, nares are without discharge. Scattered darkened facial papules. Neck: JVD significantly elevated. Lungs: Clear bilaterally to auscultation without wheezes, rales, or rhonchi. Breathing is unlabored. Heart: Irregular rhythm but rate WNL. Heart sounds are not distant. S1 S2 without murmurs, rubs, or gallops.  Abdomen: Morbidly obese and tightened skin with dressing in place from bx. Normoactive bowel sounds. No rebound/guarding.  Msk:  Strength and tone appear normal for age. Extremities: No clubbing or cyanosis. Severe LE edema bilaterally, pitting. LLE shows erythema and warmth and is tender to the touch. . Neuro: Alert and oriented X 3. Moves all extremities spontaneously. Psych:  Responds to questions appropriately with a normal affect.    Intake/Output Summary (Last 24 hours) at 12/19/11 0900 Last data filed at 12/19/11 0602  Gross per 24 hour  Intake    372 ml  Output   1225 ml  Net   -853 ml    Inpatient Medications:    . amLODipine  10 mg Oral Daily  . aspirin  81 mg Oral Daily  . colchicine  0.6 mg Oral Daily  . doxycycline  100 mg Oral Q12H  . folic acid  1 mg Oral Daily  . furosemide  60 mg Intravenous BID  . heparin subcutaneous  5,000 Units Subcutaneous Q8H  . labetalol  400 mg Oral BID  . pantoprazole  40 mg Oral Daily  . potassium chloride  40 mEq Oral Daily  . predniSONE  40 mg Oral Q breakfast  . sodium chloride  3 mL Intravenous Q12H  . sodium  chloride  3 mL Intravenous Q12H  . DISCONTD: heparin  5,000 Units Intravenous q morning - 10a  . DISCONTD: predniSONE  40 mg Oral BID WC    Labs:  Basename 12/17/11 0937  NA 133*  K 4.3  CL 94*  CO2 27  GLUCOSE 132*  BUN 46*  CREATININE 1.96*  CALCIUM 8.5  MG --  PHOS --    Radiology/Studies:  US Abdomen Complete  12/14/2011  *RADIOLOGY REPORT*  Clinical Data:  Amyloidosis.  Evaluate for organomegaly  COMPLETE ABDOMINAL ULTRASOUND  Comparison:  Renal ultrasound, 2006  Findings:  Gallbladder:  The gallbladder is distended and shows no intraluminal stones or sludge.  The gallbladder wall appears thickened with a width of 6.1 mm and has a tri- layered appearance suggesting the presence of wall edema.  Evaluation for a sonographic Murphy's sign is negative.  Common bile duct:  Measures 4.3 mm in diameter and has a normal appearance  Liver:  Is enlarged with a sagittal length of 24.1 cm in the midclavicular line no focal parenchymal abnormality is seen and overall echotexture is slightly diminished relative to the kidney. No signs of intrahepatic ductal dilatation are noted  IVC:  The proximal portion appears normal  Pancreas:  Is poorly visualized due to shadowing from overlying gas  Spleen:  Has a length of 14.4 cm and overall volume of 802 cc compatible  with splenomegaly sonographically.  The spleen demonstrates a diffuse decrease in echotexture and this finding has been described in amyloidosis.  No focal abnormality is seen  Right Kidney:  Demonstrates a sagittal length of 13.1 cm.  A mild diffuse increase in cortical echotexture is seen with no parenchymal loss.  This may be related to amyloidosis deposition or associated with the underlying chronic renal disease.  No focal abnormalities or signs of hydronephrosis are noted  Left Kidney:  Has a sagittal length of 11.6 cm.  The overall echotexture is mildly increased with no focal parenchymal abnormality or hydronephrosis noted  Abdominal  aorta:  The distal portion of the aorta is obscured by overlying gas.  The visualized portion has a maximal caliber of 2.8 cm with no aneurysmal dilatation seen  Other:  A small amount of ascites is identified in the upper abdomen.  IMPRESSION: Hepatomegaly and splenomegaly with mild decrease in echotexture suspected diffusely for both organs. 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  Slight increase in renal echotexture bilaterally.  This has been described with amyloidosis deposition but may also be secondary to the known chronic renal disease.  No focal renal abnormalities are seen.  Small amount of ascites.  Thickening of the gallbladder wall with a tri-layered pattern suggesting wall edema.  Given the presence of ascites and clinical history of anasarca, this is likely due to third spacing. In the appropriate clinical setting, this can be an indicator of acute acalculous cholecystitis.   Original Report Authenticated By: Bertha Stakes, M.D.    Dg Chest Portable 1 View  12/10/2011  *RADIOLOGY REPORT*  Clinical Data: Chest pain and shortness of breath.  Bilateral lower extremity swelling.  PORTABLE CHEST - 1 VIEW  Comparison: 01/21/2005  Findings: Shallow inspiration.  Cardiac enlargement.  Pulmonary vascularity is normal.  No focal airspace consolidation in the lungs.  No blunting of costophrenic angles.  No pneumothorax. Mediastinal contours appear intact.  Right paratracheal fullness is likely vascular.  No significant change since previous study.  IMPRESSION: Cardiac enlargement without pulmonary vascular congestion.  Lungs clear.   Original Report Authenticated By: Marlon Pel, M.D.      Assessment and Plan  1. Significant volume overload with anasarca suggesting acute on chronic diastolic heart failure in the setting of poorly controlled hypertension, possible amyloid - Lasix restarted on Monday. Will order BMET to follow Cr  and K for today and tmrw. Will re-request daily weights to be done. 2. 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 3. Atrial fibrillation, duration uncertain, adm with RVR - rates improved. Not currently on anticoagulation at this point. Will have to await direction of amyloid w/u, rx before committing to anticoagulation. CHADS2 = 2 for CHF/HTN. Continue ASA.  4. CKD stage III - BMET today. 5. Gouty arthritis - possibly exacerbated by diuretics. On prednisone & pain control. Will defer to IM whether they want to give her additional SoluMedrol. No NSAIDS due to #4. 6. HTN, improved but still somewhat elevated this AM - consider increasing labetalol or adding hydralazine. 7. Fever - w/u per IM. BCx pending. 8. Pulm HTN - will need OP polysomnogram. 9. Moderate pericardial effusion by echo 12/11/11 - Not hypotensive and heart sounds are not distant, but JVD up. Consider f/u echo at some point - will defer to MD.   Signed, Ronie Spies PA-C I have seen and examined the  patient. I reviewed prior notes and I have reviewed the note above. Chemistry lab is now available. The patient's creatinine has gone up significantly. I have stopped the Lasix for today. Treating the patient's volume status is a very difficult issue. Question has been raised as to whether or not volume could be taken off with ultrafiltration.  Jerral Bonito, MD

## 2011-12-19 NOTE — Progress Notes (Signed)
Dr. Karilyn Cota notified of pt. Wanting to keep Foley cathether in, pt. Stated" I won't make it in time because of gout on my left foot, leg, and the lasix:.

## 2011-12-19 NOTE — Progress Notes (Signed)
Still waiting fat pad biopsy results.    If negative, will recommend Bone marrow aspiration and biopsy via interventional radiology.  Labs reviewed.   Nayab Aten

## 2011-12-19 NOTE — Progress Notes (Addendum)
Nutrition Brief Note  Patient chart reviewed due to length of stay.  Body mass index is 53.98 kg/(m^2). Pt meets criteria for Obesity Class III based on current BMI.   Current diet order is Heart Healthy/ 1.5 L/d fluid restriction, patient is consuming approximately 75-100% of most meals at this time. Labs and medications reviewed.   No nutrition interventions warranted at this time. If nutrition issues arise, please consult RD.   [phone]

## 2011-12-19 NOTE — Progress Notes (Signed)
     Subjective: This lady says she feels better overall but has been suffering with left foot gout and cellulitis. Her renal function has deteriorated today and diuretics have been stopped. She has not been weighed on daily basis.          Physical Exam: Blood pressure 132/84, pulse 96, temperature 98 F (36.7 C), temperature source Oral, resp. rate 17, height 5\' 11"  (1.803 m), weight 175.542 kg (387 lb), last menstrual period 12/01/2011, SpO2 96.00%. She is morbidly obese. She has peripheral pitting edema in her legs. Left foot gout and cellulitis. Heart sounds are present and normal and irregularly irregular, consistent with atrial fibrillation. Lung fields are clear. She is alert and orientated.   Investigations:     Basic Metabolic Panel:  Basename 12/19/11 0917 12/17/11 0937  NA 133* 133*  K 4.7 4.3  CL 95* 94*  CO2 26 27  GLUCOSE 138* 132*  BUN 68* 46*  CREATININE 2.43* 1.96*  CALCIUM 8.9 8.5  MG -- --  PHOS -- --          Medications: I have reviewed the patient's current medications.  Impression: 1. Diastolic congestive heart failure, improving. Abnormal echocardiogram suggestive of possible infiltrative disease such as amyloidosis. Status post fat pad biopsy. 2. Uncontrolled hypertension with evidence of noncompliance, slightly better control. 3. Obesity. 4. Atrial fibrillation. 5. Chronic kidney disease, with acute deterioration in renal function now. 6. Left foot gout. 7. Left foot/leg cellulitis.      Plan: 1. Agree with discontinuing Lasix for the time being. 2. Continue with other current therapy. 3. Monitor renal function, I think she is approaching stability as an inpatient and can be probably discharged tomorrow with followup of the workup as an outpatient..      LOS: 9 days   Wilson Singer Pager [phone]  12/19/2011, 11:39 AM

## 2011-12-19 NOTE — Progress Notes (Signed)
1 Day Post-Op  Subjective: Comfortable.  No abdominal pain.  No drainage.  Objective: Vital signs in last 24 hours: Temp:  [97.9 F (36.6 C)-98.1 F (36.7 C)] 97.9 F (36.6 C) (10/23 2102) Pulse Rate:  [92-105] 105  (10/23 2102) Resp:  [17-18] 17  (10/23 2102) BP: (119-145)/(79-89) 142/79 mmHg (10/23 2102) SpO2:  [95 %-97 %] 97 % (10/23 2102) Last BM Date: 12/18/11  Intake/Output from previous day: 10/22 0701 - 10/23 0700 In: 622 [P.O.:610; IV Piggyback:12] Out: 1225 [Urine:1225] Intake/Output this shift: Total I/O In: 360 [P.O.:360] Out: 275 [Urine:275]  GI: soft, non-tender; bowel sounds normal; no masses,  no organomegaly and incision is c/d/i.    Lab Results:  No results found for this basename: WBC:2,HGB:2,HCT:2,PLT:2 in the last 72 hours BMET  [facility] 12/19/11 0917 12/17/11 0937  NA 133* 133*  K 4.7 4.3  CL 95* 94*  CO2 26 27  GLUCOSE 138* 132*  BUN 68* 46*  CREATININE 2.43* 1.96*  CALCIUM 8.9 8.5   PT/INR No results found for this basename: LABPROT:2,INR:2 in the last 72 hours ABG No results found for this basename: PHART:2,PCO2:2,PO2:2,HCO3:2 in the last 72 hours  Studies/Results: No results found.  Anti-infectives: Anti-infectives     Start     Dose/Rate Route Frequency Ordered Stop   12/18/11 0600   vancomycin (VANCOCIN) 1,250 mg in sodium chloride 0.9 % 250 mL IVPB  Status:  Discontinued        1,250 mg 166.7 mL/hr over 90 Minutes Intravenous Every 24 hours 12/17/11 1416 12/17/11 1627   12/17/11 2200   doxycycline (VIBRA-TABS) tablet 100 mg        100 mg Oral Every 12 hours 12/17/11 1629     12/16/11 0100   vancomycin (VANCOCIN) 1,500 mg in sodium chloride 0.9 % 500 mL IVPB  Status:  Discontinued        1,500 mg 250 mL/hr over 120 Minutes Intravenous Every 12 hours 12/15/11 1226 12/17/11 1416   12/15/11 1300   vancomycin (VANCOCIN) 2,000 mg in sodium chloride 0.9 % 500 mL IVPB        2,000 mg 250 mL/hr over 120 Minutes Intravenous  Once  12/15/11 1226 12/15/11 1659          Assessment/Plan: s/p Procedure(s) (LRB) with comments: BIOPSY SKIN (N/A) - in the minor room. Doing well from biopsy.  Will keep sutures in place 14-20days.  Path pending.  LOS: 9 days    Timiya Howells C 12/19/2011

## 2011-12-20 LAB — IMMUNOFIXATION ELECTROPHORESIS
IgA: 272 mg/dL (ref 69–380)
IgG (Immunoglobin G), Serum: 2270 mg/dL — ABNORMAL HIGH (ref 690–1700)
Total Protein ELP: 7.3 g/dL (ref 6.0–8.3)

## 2011-12-20 LAB — BASIC METABOLIC PANEL
CO2: 25 mEq/L (ref 19–32)
Calcium: 8.7 mg/dL (ref 8.4–10.5)
Glucose, Bld: 110 mg/dL — ABNORMAL HIGH (ref 70–99)
Potassium: 4.7 mEq/L (ref 3.5–5.1)
Sodium: 133 mEq/L — ABNORMAL LOW (ref 135–145)

## 2011-12-20 LAB — CULTURE, BLOOD (ROUTINE X 2)
Culture: NO GROWTH
Culture: NO GROWTH

## 2011-12-20 MED ORDER — PREDNISONE 20 MG PO TABS: ORAL_TABLET | ORAL | Status: DC

## 2011-12-20 MED ORDER — SODIUM CHLORIDE 0.9 % IJ SOLN
INTRAMUSCULAR | Status: AC
  Filled 2011-12-20: qty 6

## 2011-12-20 MED ORDER — COLCHICINE 0.6 MG PO TABS: 0.6000 mg | ORAL_TABLET | Freq: Every day | ORAL | Status: DC

## 2011-12-20 MED ORDER — FOLIC ACID 1 MG PO TABS: 1.0000 mg | ORAL_TABLET | Freq: Every day | ORAL | Status: DC

## 2011-12-20 MED ORDER — PANTOPRAZOLE SODIUM 40 MG PO TBEC: 40.0000 mg | DELAYED_RELEASE_TABLET | Freq: Every day | ORAL | Status: DC

## 2011-12-20 MED ORDER — DOXYCYCLINE HYCLATE 100 MG PO TABS: 100.0000 mg | ORAL_TABLET | Freq: Two times a day (BID) | ORAL | Status: DC

## 2011-12-20 MED ORDER — LABETALOL HCL 200 MG PO TABS: 400.0000 mg | ORAL_TABLET | Freq: Two times a day (BID) | ORAL | Status: DC

## 2011-12-20 MED ORDER — HYDROCODONE-ACETAMINOPHEN 5-325 MG PO TABS: 1.0000 | ORAL_TABLET | ORAL | Status: DC | PRN

## 2011-12-20 MED ORDER — FUROSEMIDE 40 MG PO TABS: 40.0000 mg | ORAL_TABLET | Freq: Every day | ORAL | Status: DC

## 2011-12-20 MED ORDER — AMLODIPINE BESYLATE 10 MG PO TABS: 10.0000 mg | ORAL_TABLET | Freq: Every day | ORAL | Status: DC

## 2011-12-20 NOTE — Discharge Summary (Addendum)
Physician Discharge Summary  [name]:[id number] DOB: 05/12/1970 DOA: 12/10/2011  PCP: [name]., MD  Admit date: 12/10/2011 Discharge date: 12/22/2011  Time spent: Greater than 30 minutes  Recommendations for Outpatient Follow-up:  1. Follow with cardiology regarding her heart failure/pericardial effusion and possible amyloidosis. 2. Follow with oncology/hematology regarding workup that was done in the hospital for amyloidosis. 3. Follow with surgery, Dr. Leticia Penna, for removal of stitches following fat pad biopsy. 4. Home health physical therapy, RN.   Discharge Diagnoses:  1. Profound anasarca her/fluid overload secondary to diastolic congestive heart failure. 2. Moderate pericardial effusion. 3. 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 4. Accelerated hypertension 5. Atrial fibrillation with controlled ventricular response. 6. Morbid obesity. 7. Cellulitis of the left leg, improving. 8. Gout of the left foot. 9. Acute on chronic kidney disease.   Discharge Condition: Stable.   Diet recommendation:  heart healthy diet.   Filed Weights   12/11/11 0500 12/12/11 0520 12/14/11 0604  Weight: 184.07 kg (405 lb 12.8 oz) 180.577 kg (398 lb 1.6 oz) 175.542 kg (387 lb)    History of present illness:   this 41 year old lady presents to the hospital with symptoms of swelling in her legs for several weeks. Please see initial history as outlined below:  HPI: This is a 41 year old,  African American female, who is morbidly obese, who has a past history of A. fib/flutter, history of uncontrolled, hypertension, gout. She was last admitted for her cardiac issues back in 2006. She had to be transferred to [facility] because of atrial flutter. She underwent a cardiac catheterization at that time. Coronaries were clear and her EF was about 50-60%. LVH was noted. She appears to be noncompliant with her medication regimen. She hasn't had followup with her  primary care physician in over a year. She says her blood pressures not very well controlled. She comes in because of progressive swelling of her legs. The swelling started in the legs on both sides and then has been progressing upwards. Abdomen is also swollen. She denies any chest pain. Denies any shortness of breath. There is no symptoms suggestive of orthopnea or PND. She always has used 2 pillows to sleep at night. Denies any dizziness or lightheadedness. Denies any decreased amount of urination recently. She denies any fever, chills, nausea, vomiting. She has been feeling tired lately and has been having some joint pains in her shoulder, elbow and knee joints. Since the symptoms were not getting any better she decided to come in to the hospital. She obviously has gained a lot of weight, however, she is unable to quantify.  Hospital Course:   patient was admitted to the hospital and started on intravenous diuresis. She has lost approximately 18-20 pounds was in the hospital. Workup consisted of echocardiogram. This was extremely abnormal as outlined below. 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 She was seen by cardiology, hematology oncology and surgery, Dr. Leticia Penna, who performed a fat pad biopsy. The results of the fat pad biopsy are still pending. During hospitalization, intravenous Lasix was given, her renal function deteriorated and therefore it was stopped. This morning her creatinine is stabilized. She is complaining of pain in the left leg and foot. However, she is on oral medications now. She has reached maximum benefit from hospitalization. The rest of the workup can continue as an outpatient. We will arrange home health physical therapy and RN.   Procedures:   echocardiogram : Study Conclusions  - Left ventricle: The  cavity size was normal. Wall thickness was increased in a pattern of severe LVH. 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 Systolic function was normal. The estimated ejection fraction was in the range of 55% to 60%. The study is not technically sufficient to allow evaluation of LV diastolic function. - Aortic valve: Trivial regurgitation. - Mitral valve: Mild regurgitation. - Left atrium: The atrium was severely dilated. - Right ventricle: The cavity size was mildly dilated. - Right atrium: The atrium was mildly to moderately dilated. - Tricuspid valve: Mild regurgitation. - Pulmonary arteries: Systolic pressure was moderately to severely increased. PA peak pressure: 72mm Hg (S). - Inferior vena cava: The vessel was dilated; the respirophasic diameter changes were blunted (< 50%); findings are consistent with elevated central venous pressure. - Pericardium, extracardiac: A moderate pericardial effusion was identified circumferential to the heart with largest collection posteriorly. Transthoracic echocardiography. M-mode, complete 2D, spectral Doppler, and color Doppler. Height: Height: 180.3cm. Height: 71in. Weight: Weight: 183.7kg. Weight: 404.2lb. Body mass index: BMI: 56.5kg/m^2. Body surface area: BSA: 2.54m^2. Patient status: Inpatient. Location: Bedside.   Consultations:   cardiology, [place].  Oncology/hematology, Dr. Mariel Sleet.  Surgery, Dr. Leticia Penna for fat pad biopsy.   Discharge Exam: Filed Vitals:   12/19/11 1044 12/19/11 1517 12/19/11 2102 12/20/11 0423  BP: 132/84 119/82 142/79 156/102  Pulse: 96 92 105 92  Temp:  98.1 F (36.7 C) 97.9 F (36.6 C) 98.4 F (36.9 C)  TempSrc:  Oral Oral Oral  Resp:  18 17 18   Height:      Weight:      SpO2:  95% 97% 96%    General:  she looks systemically well. She is morbidly obese.  Cardiovascular: Heart sounds are present and normal without gallop rhythm. She has peripheral pitting edema.  Respiratory:  lung fields are clinically clear. She does have redness/cellulitis of the left foot and lower leg. She  is alert and orientated.   Discharge Instructions  Discharge Orders    Future Orders Please Complete By Expires   Diet - low sodium heart healthy      Increase activity slowly          Medication List     As of 12/20/2011 10:09 AM    STOP taking these medications         ibuprofen 200 MG tablet   Commonly known as: ADVIL,MOTRIN      TAKE these medications         amLODipine 10 MG tablet   Commonly known as: NORVASC   Take 1 tablet (10 mg total) by mouth daily.      aspirin 81 MG tablet   Take 81 mg by mouth daily.      colchicine 0.6 MG tablet   Take 1 tablet (0.6 mg total) by mouth daily.      doxycycline 100 MG tablet   Commonly known as: VIBRA-TABS   Take 1 tablet (100 mg total) by mouth every 12 (twelve) hours.      folic acid 1 MG tablet   Commonly known as: FOLVITE   Take 1 tablet (1 mg total) by mouth daily.      furosemide 40 MG tablet   Commonly known as: LASIX   Take 1 tablet (40 mg total) by mouth daily.      HYDROcodone-acetaminophen 5-325 MG per tablet   Commonly known as: NORCO/VICODIN   Take 1 tablet by mouth every 4 (four) hours as needed.  labetalol 200 MG tablet   Commonly known as: NORMODYNE   Take 2 tablets (400 mg total) by mouth 2 (two) times daily.      pantoprazole 40 MG tablet   Commonly known as: PROTONIX   Take 1 tablet (40 mg total) by mouth daily.      predniSONE 20 MG tablet   Commonly known as: DELTASONE   Take 2 tablets daily for 3 days, then 1 tablet daily for 3 days, then half tablet daily for 3 days, then STOP.           Follow-up Information    Follow up with Lakeside Bing, MD. Schedule an appointment as soon as possible for a visit in 1 week.   Contact information:   [address] [place] [address] [phone]       Follow up with Randall An, MD. Schedule an appointment as soon as possible for a visit in 1 week.   Contact information:   [address] [place]  [address] [phone]       Follow up with Fabio Bering, MD. Schedule an appointment as soon as possible for a visit in 2 weeks.   Contact information:   [address] [place] [address] [phone]           The results of significant diagnostics from this hospitalization (including imaging, microbiology, ancillary and laboratory) are listed below for reference.    Significant Diagnostic Studies: US Abdomen Complete  12/14/2011  *RADIOLOGY REPORT*  Clinical Data:  Amyloidosis.  Evaluate for organomegaly  COMPLETE ABDOMINAL ULTRASOUND  Comparison:  Renal ultrasound, 2006  Findings:  Gallbladder:  The gallbladder is distended and shows no intraluminal stones or sludge.  The gallbladder wall appears thickened with a width of 6.1 mm and has a tri- layered appearance suggesting the presence of wall edema.  Evaluation for a sonographic Murphy's sign is negative.  Common bile duct:  Measures 4.3 mm in diameter and has a normal appearance  Liver:  Is enlarged with a sagittal length of 24.1 cm in the midclavicular line no focal parenchymal abnormality is seen and overall echotexture is slightly diminished relative to the kidney. No signs of intrahepatic ductal dilatation are noted  IVC:  The proximal portion appears normal  Pancreas:  Is poorly visualized due to shadowing from overlying gas  Spleen:  Has a length of 14.4 cm and overall volume of 802 cc compatible with splenomegaly sonographically.  The spleen demonstrates a diffuse decrease in echotexture and this finding has been described in amyloidosis.  No focal abnormality is seen  Right Kidney:  Demonstrates a sagittal length of 13.1 cm.  A mild diffuse increase in cortical echotexture is seen with no parenchymal loss.  This may be related to amyloidosis deposition or associated with the underlying chronic renal disease.  No focal abnormalities or signs of hydronephrosis are noted  Left Kidney:  Has a sagittal length of 11.6 cm.  The  overall echotexture is mildly increased with no focal parenchymal abnormality or hydronephrosis noted  Abdominal aorta:  The distal portion of the aorta is obscured by overlying gas.  The visualized portion has a maximal caliber of 2.8 cm with no aneurysmal dilatation seen  Other:  A small amount of ascites is identified in the upper abdomen.  IMPRESSION: Hepatomegaly and splenomegaly with mild decrease in echotexture suspected diffusely for both organs. 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  Slight  increase in renal echotexture bilaterally.  This has been described with amyloidosis deposition but may also be secondary to the known chronic renal disease.  No focal renal abnormalities are seen.  Small amount of ascites.  Thickening of the gallbladder wall with a tri-layered pattern suggesting wall edema.  Given the presence of ascites and clinical history of anasarca, this is likely due to third spacing. In the appropriate clinical setting, this can be an indicator of acute acalculous cholecystitis.   Original Report Authenticated By: Bertha Stakes, M.D.    Dg Chest Portable 1 View  12/10/2011  *RADIOLOGY REPORT*  Clinical Data: Chest pain and shortness of breath.  Bilateral lower extremity swelling.  PORTABLE CHEST - 1 VIEW  Comparison: 01/21/2005  Findings: Shallow inspiration.  Cardiac enlargement.  Pulmonary vascularity is normal.  No focal airspace consolidation in the lungs.  No blunting of costophrenic angles.  No pneumothorax. Mediastinal contours appear intact.  Right paratracheal fullness is likely vascular.  No significant change since previous study.  IMPRESSION: Cardiac enlargement without pulmonary vascular congestion.  Lungs clear.   Original Report Authenticated By: Marlon Pel, M.D.     Microbiology: Recent Results (from the past 240 hour(s))  CULTURE, BLOOD (ROUTINE X 2)     Status: Normal (Preliminary result)    Collection Time   12/15/11 10:55 PM      Component Value Range Status Comment   Specimen Description BLOOD LEFT HAND   Final    Special Requests BOTTLES DRAWN AEROBIC AND ANAEROBIC 7CC   Final    Culture NO GROWTH 4 DAYS   Final    Report Status PENDING   Incomplete   CULTURE, BLOOD (ROUTINE X 2)     Status: Normal (Preliminary result)   Collection Time   12/15/11 11:00 PM      Component Value Range Status Comment   Specimen Description BLOOD LEFT ARM   Final    Special Requests BOTTLES DRAWN AEROBIC ONLY [facility]   Final    Culture NO GROWTH 4 DAYS   Final    Report Status PENDING   Incomplete      Labs: Basic Metabolic Panel:  Lab 12/20/11 4098 12/19/11 0917 12/17/11 0937 12/15/11 0538 12/14/11 0520  NA 133* 133* 133* 139 137  K 4.7 4.7 4.3 3.7 4.0  CL 95* 95* 94* 99 99  CO2 25 26 27 29 28   GLUCOSE 110* 138* 132* 99 89  BUN 69* 68* 46* 33* 27*  CREATININE 2.39* 2.43* 1.96* 2.02* 1.90*  CALCIUM 8.7 8.9 8.5 9.0 9.1  MG -- -- -- -- --  PHOS -- -- -- -- --        BNP: BNP (last 3 results)  Basename 12/14/11 2102 12/11/11 1330  PROBNP 10792.0* 10471.0*         Signed:  Solmon Bohr C  [facility] 12/20/2011, 10:09 AM  Addendum: Fat pad biopsy was negative for amyloidosis. Per oncology, she needs to have a bone marrow biopsy which can be done as an outpatient. She refused to have a CT abdominal scan.

## 2011-12-20 NOTE — Progress Notes (Signed)
Pt refuses to have foley catheter removed again today 10/24

## 2011-12-20 NOTE — Progress Notes (Signed)
Dr. Karilyn Cota notified of pt. Refusing to have foley catheter removed.

## 2011-12-21 ENCOUNTER — Inpatient Hospital Stay (HOSPITAL_COMMUNITY): Payer: Medicare Other

## 2011-12-21 NOTE — Progress Notes (Signed)
Patient's pathology reviewed.  Fat pad biopsy is negative for amyloidosis.  Due to patient's presentation and radiographic findings, will order CT abd/pelvis without contrast and will ask the hospitalist to help arrange a bone marrow aspiration and biopsy via Interventional Radiology.  Spoke with patient about these findings and the future plan.   Will follow-up as an outpatient.   KEFALAS,THOMAS

## 2011-12-21 NOTE — Progress Notes (Signed)
Pathology called.  Dr. Gilford Silvius is assigned to the patient's case (fat pad biopsy).  He reports that he is waiting for the [place] Red staining test.  He expects those results by noon today.    Appears as though the patient will be discharged today.    I reviewed the above mentioned information with the patient.   If able, and the patient is still in the hospital, will discuss the results of the pathology with her.  If discharged, will follow-up with the patient.   If biopsy is negative, will pursue a bone marrow aspiration and biopsy via Interventional Radiology due to patient;s body habitus.  This was discussed with Danford Bad and she is in agreement.    Will follow-up with the patient on an outpatient basis in 2-3 weeks.   KEFALAS,THOMAS

## 2011-12-21 NOTE — Progress Notes (Signed)
Patient refuses Foley to be discontinued. MD aware. Case management and MD handling discharge.

## 2011-12-21 NOTE — Progress Notes (Signed)
Patient remains clinically stable. She appealed discharge to Medicare. Medicare's decision is that she should be discharged. She has still tomorrow noon to discharge. She agrees to discharge tomorrow morning. In the meantime, we are waiting fat pad biopsy results.

## 2011-12-22 ENCOUNTER — Inpatient Hospital Stay (HOSPITAL_COMMUNITY): Payer: Medicare Other

## 2011-12-22 LAB — BASIC METABOLIC PANEL
BUN: 76 mg/dL — ABNORMAL HIGH (ref 6–23)
Chloride: 97 mEq/L (ref 96–112)
Glucose, Bld: 95 mg/dL (ref 70–99)
Potassium: 5.1 mEq/L (ref 3.5–5.1)

## 2011-12-22 MED ORDER — PREDNISONE 20 MG PO TABS
20.0000 mg | ORAL_TABLET | Freq: Every day | ORAL | Status: DC
  Administered 2011-12-23 – 2011-12-26 (×4): 20 mg via ORAL
  Filled 2011-12-22 (×5): qty 1

## 2011-12-22 MED ORDER — FUROSEMIDE 10 MG/ML IJ SOLN
80.0000 mg | Freq: Two times a day (BID) | INTRAMUSCULAR | Status: DC
  Administered 2011-12-22 – 2011-12-23 (×2): 80 mg via INTRAVENOUS
  Filled 2011-12-22 (×4): qty 8

## 2011-12-22 NOTE — Progress Notes (Signed)
Pt taken to main entrance, then states she is unable to get in her sisters minivan.  Unable to fit her legs in.  Tried front and back seat and was unable to maneuver her into the car.  Pt's sister is upset asking how and why Ms Hegner was being discharged.  Pt put back in wheelchair. AC notified. Dr. Karilyn Cota notified.  EMS called, EMS refusing to transport pt home.  AC working on arranging [facility] transportation to take pt home.  AC talked with pt and her sister to calm them down and help them understand the situation. Both seem calm and agreeable to transporting home via [facility].

## 2011-12-22 NOTE — Progress Notes (Signed)
Transportation arranged for pt home through [facility] transportation.  Notified pt.

## 2011-12-22 NOTE — Progress Notes (Signed)
Pt's ride is here, pt taken to main entrance in wheelchair by two staff members.

## 2011-12-22 NOTE — Progress Notes (Signed)
Pt's sister has come from [place]. Is demanding to see the MD.  States she is not letting Ms Spindel be discharged.  Pt's sister is very agitated and angry. Security called to the floor. Paged Dr Karilyn Cota and notified [facility] who is currently in room with pt and her family.

## 2011-12-22 NOTE — Progress Notes (Signed)
Patient remains clinically stable. She was due to have CT scan of the abdomen done today but she refused when she got to the radiology department. She remains medically stable for discharge. She will need to have followup as an outpatient for further investigations, including bone marrow biopsy. Fat pad biopsy was negative for amyloidosis.

## 2011-12-22 NOTE — Progress Notes (Signed)
Report called to nurse on 2900. Pt stable and ready for transfer.

## 2011-12-22 NOTE — Progress Notes (Signed)
IV removed, site WNL.  Foley removed as well prior to d/c.Pt given d/c instructions and new prescriptions.  Discussed home care with patient and discussed home medications, patient verbalizes understanding, teachback completed. F/U appointments to be made by pt, pt states they will keep appointment. Pt is stable at this time. Dressings to L leg intact and dry, changed 12/22/11. HH has been arranged.

## 2011-12-22 NOTE — Progress Notes (Signed)
Subjective: This lady has had a prolonged hospitalization, presenting with signs and symptoms of congestive heart failure. Echocardiogram confirmed the presence of diastolic heart failure with ejection fraction of 55-60%, severe LVH, moderate pericardial effusion and echocardiographic changes consistent with a possible infiltrative disease. Workup was done for amyloidosis, so far this is negative. In the meantime she was given intravenous Lasix for diuresis. Unfortunately her renal function deteriorated. Lasix has been held for the time being. The patient is now very deconditioned, apparently unable to walk. She also has had gout flare and cellulitis in the left foot and leg, which is improving. She is on oral steroids and oral antibiotics. In view of the concern regarding her anasarca her, I had a discussion with Dr. Teressa Lower , cardiologist regarding her overall situation. He agrees that she would benefit from right heart catheterization in particular and may be a candidate for ultrafiltration. He is therefore agreed to consult on this lady with this in mind.           Physical Exam: Blood pressure 144/94, pulse 98, temperature 98.6 F (37 C), temperature source Oral, resp. rate 20, height 5\' 11"  (1.803 m), weight 175.542 kg (387 lb), last menstrual period 12/01/2011, SpO2 97.00%. She looks chronically sick. She is morbidly obese. Heart sounds are present and distant. Lung fields are clear with reduced air entry at both bases. She has pitting peripheral edema in her legs. Overall, examination is difficult due to her morbid obesity. She is alert and orientated however.   Investigations:  Recent Results (from the past 240 hour(s))  CULTURE, BLOOD (ROUTINE X 2)     Status: Normal   Collection Time   12/15/11 10:55 PM      Component Value Range Status Comment   Specimen Description BLOOD LEFT HAND   Final    Special Requests BOTTLES DRAWN AEROBIC AND ANAEROBIC 7CC   Final    Culture  NO GROWTH 5 DAYS   Final    Report Status 12/20/2011 FINAL   Final   CULTURE, BLOOD (ROUTINE X 2)     Status: Normal   Collection Time   12/15/11 11:00 PM      Component Value Range Status Comment   Specimen Description BLOOD LEFT ARM   Final    Special Requests BOTTLES DRAWN AEROBIC ONLY 6CC   Final    Culture NO GROWTH 5 DAYS   Final    Report Status 12/20/2011 FINAL   Final      Basic Metabolic Panel:  Basename 12/22/11 0705 12/20/11 0506  NA 133* 133*  K 5.1 4.7  CL 97 95*  CO2 25 25  GLUCOSE 95 110*  BUN 76* 69*  CREATININE 2.25* 2.39*  CALCIUM 9.1 8.7  MG -- --  PHOS -- --         Medications: I have reviewed the patient's current medications.  Impression: 1. Significant anasarca/volume overload, probably secondary to right heart failure. 2. Morbid obesity, probably with obesity hypoventilation syndrome and right heart failure. 3. Acute on chronic kidney disease, exacerbated by recent intravenous diuretic use. 4. Uncontrolled hypertension, contributing to severe LVH. 5. Recent left foot gout flare, improving, on steroids. 6. Left lower leg cellulitis, improving, on oral antibiotics.     Plan: 1. Transfer patient to [facility] to the step down unit. Try to obtain IV access, probably will need PICC line tomorrow morning. 2. Formally consult Dr. Teressa Lower, cardiology for further management.     LOS: 12  days   Wilson Singer Pager [phone]  12/22/2011, 4:26 PM

## 2011-12-22 NOTE — Progress Notes (Addendum)
This patient was due to be discharged today, the patient actually had agreed to go home. Unfortunately the patient was unable to get into her sister's minivan. At this point the patient and family did not wish her to be discharged. I have discussed the situation with the patient's sister who claims that the patient cannot walk. I've told the patient's sister that we will ask physical therapy  to see her again, unfortunately the weekend there is no physical therapy available. Therefore, the patient will have to stay in the hospital.  I've discussed the case with Dr Fausto Skillern, nephrology, who will come by and see her. In the meantime, he is recommended that we start the patient on Lasix 80 mg twice a day and then he will make further recommendations. Therefore discharge will be postponed for the time being.

## 2011-12-23 LAB — BASIC METABOLIC PANEL
CO2: 27 mEq/L (ref 19–32)
Calcium: 9.1 mg/dL (ref 8.4–10.5)
Creatinine, Ser: 2.29 mg/dL — ABNORMAL HIGH (ref 0.50–1.10)
GFR calc non Af Amer: 26 mL/min — ABNORMAL LOW (ref >90)
Glucose, Bld: 69 mg/dL — ABNORMAL LOW (ref 70–99)
Sodium: 136 mEq/L (ref 135–145)

## 2011-12-23 LAB — CBC
Hemoglobin: 8.3 g/dL — ABNORMAL LOW (ref 12.0–15.0)
MCH: 29 pg (ref 26.0–34.0)
MCV: 84.3 fL (ref 78.0–100.0)
RBC: 2.86 MIL/uL — ABNORMAL LOW (ref 3.87–5.11)

## 2011-12-23 MED ORDER — FUROSEMIDE 10 MG/ML IJ SOLN
5.0000 mg/h | INTRAVENOUS | Status: DC
  Administered 2011-12-23 – 2011-12-25 (×4): 10 mg/h via INTRAVENOUS
  Administered 2011-12-27: 5 mg/h via INTRAVENOUS
  Filled 2011-12-23 (×11): qty 25

## 2011-12-23 NOTE — Progress Notes (Signed)
   Patient's chart reviewed. I have discussed with Dr. Karilyn Cota. Ms. Curnutt appears to have significant right heart failure and possible cardiorenal syndrome.  The HF team will see in am. Will await to gauge response to lasix gtt. If renal function continue to worsen may need right heart cath or inotropic support.  Truman Hayward 7:31 PM

## 2011-12-23 NOTE — Consult Note (Addendum)
  Reason for Consult:chronic diastolic CHF and worsening renal insufficiency  Referring Physician: Dr. Henreitta Cea is an 41 y.o. female.   HPI: The patient is a morbidly obese 41 yo woman with a h/o HTN, poorly controlled, who was admitted with volume overload and because of being refractory to diuresis is admitted in [facility] for [facility]. She has a h/o non-compliance. She has massive peripheral edema and has been obese for many years. She also has a h/o gout with flairs in the past.  PMH: Past Medical History  Diagnosis Date  . Essential hypertension, benign     History of accelerated hypertension with urgency  . Atrial flutter     Documented 2006 - [facility]  . Gout   . History of cardiac catheterization     Ectatic coronaries without obstruction 2006 - [facility]  . Morbid obesity   . Hypertensive heart disease     LVEF reportedly 50-55% in 2006 - [facility]  . CKD (chronic kidney disease) stage 3, GFR 30-59 ml/min     PSHX:History reviewed. No pertinent past surgical history.  FAMHX: Family History  Problem Relation Age of Onset  . Hypertension      Social History:  reports that she has never smoked. She does not have any smokeless tobacco history on file. She reports that she does not drink alcohol or use illicit drugs.  Allergies:  Allergies  Allergen Reactions  . Penicillins Hives    Childhood allergy    Medications: reviewed.  No results found.  ROS  As stated in the HPI and negative for all other systems. Chronic dyspnea.  Physical Exam  Vitals:Blood pressure 153/72, pulse 88, temperature 98.1 F (36.7 C), temperature source Oral, resp. rate 18, height 5\' 11"  (1.803 m), weight 388 lb 14.3 oz (176.4 kg), last menstrual period 12/01/2011, SpO2 96.00%.  Massively obese appearing NAD HEENT: Unremarkable Neck:  No JVD, no thyromegally Lungs:  Decreased breath sounds throughout without increased work of breathing. HEART:  Regular rate  rhythm, no murmurs, no rubs, no clicks Abd:  Morbidly obese, positive bowel sounds, no organomegally, no rebound, no guarding Ext:  2 plus pulses, massive edema bilaterally, no cyanosis, no clubbing Skin:  No rashes no nodules Neuro:  CN II through XII intact, motor grossly intact  Assessment/Plan: 1. Acute/chronic diastolic chf - she has been refractory to diuresis and may require ultrafiltration. Will try a lasix drip and 15 mg/hour. 2. Massive obesity 3. Peripheral edema with possible component of lymphedema. 4. Gout  Kristopher Glee 12/23/2011, 12:19 PM

## 2011-12-23 NOTE — Progress Notes (Signed)
[facility] PROGRESS NOTE  [name] VWU:[id number] DOB: 1970/04/07 DOA: 12/10/2011 PCP: [name]., MD  Assessment/Plan: Principal Problem:  *Anasarca Active Problems:  Cardiomegaly  Accelerated hypertension  Elevated serum creatinine  Hyperbilirubinemia  Acute gouty arthropathy  Atrial fibrillation  Morbid obesity  Acute on chronic diastolic heart failure  Pulmonary hypertension  Cellulitis of left leg  Fever    1. Significant anasarca/volume overload: Patient presented with progressive LE and abdominal edema, associated with dyspnea. This is likely secondary to right heart failure. Managing with diuretics, within the limits imposed by acute on chronic renal failure. Patient appear to be diuresing well. Have consulted DR Arvilla Meres et al, and will await recommendations.  2. Dyspnea: This is multifactorial and mainly exertional, due to RHF, morbid obesity, probable hypoventilation syndrome and OSA.  3. Acute on chronic kidney disease: Patient's creatinine was 1.8 at presentation, consistent with CKD. During her hospitalization, creatinine has climbed progressively, and reached a high of 2.43 on 12/19/11. The culprit, is recent intravenous diuretic use. Over the last couple of days however, creatinine appears to have plateaued and is 2.25 today.  4. Uncontrolled hypertension: Patient has historically, had uncontrolled HTN, for which medication and dietary non-compliance may be a huge contributory factor. BP was 180/136 at presentation, but has improved over the course of hospitalization, on Amlodipine, Labetalol and Lasix.  5. Left foot gout flare: Known history of gout. She developed and acute flare, affecting left foot during this hospitalization, and is improving, on steroids.  6. Left lower leg cellulitis: Patient has redness, swelling and pain, affecting left lower leg, up to just below her knee. She was initially managed with iv Vancomycin, but has since  been transitioned to oral Doxycycline from 12/17/11. Clinically, this appears to be gradually improving. 7. Atrial fibrillation: Patient is currently in rate-controlled atrial fibrillation. Defer decision as to possible anticoagulation, to cardiologists.  8. Anemia: Patient has a moderate normocytic anemia. Hematinic studies of 12/10/11 are normal. This may be anemia of critical illness. Check stool guaiacs.  9. Deconditioning: Paitent is profoundly deconditioned at this time. Consult PT/OT.    Code Status: Full Code. Family Communication:  Disposition Plan: To be determined.    Brief narrative: 41 year old, morbidly obese, African American female, with history of A. fib/flutter, uncontrolled, hypertension, gout. She was last admitted for her cardiac issues back in 2006 and had to be transferred to [facility] because of atrial flutter, underwent a cardiac catheterization revealing clear coronaries and EF of 50-60%. LVH was noted. She had been noncompliant with her medication regimen and hadn't followed up with her primary care physician in over a year. She presented with progressive LE and abdominal swelling of her legs, and was admitted to [facility] on 12/10/11. This lady has had a prolonged hospitalization, presenting with signs and symptoms of congestive heart failure. Echocardiogram confirmed the presence of diastolic heart failure with ejection fraction of 55-60%, severe LVH, moderate pericardial effusion and echocardiographic changes consistent with a possible infiltrative disease. Workup was done for amyloidosis, including SPEP/UPEP and subcutaneous fat biopsy, all of which have been negative. She was managed with  intravenous Lasix for diuresis, with improvement in edema, but unfortunately her renal function has deteriorated, and Lasix was temporarily placed on hold. Patient is now very deconditioned, and unable to walk. She also has had gout flare and cellulitis in the left foot and leg,  which is improving on oral steroids and oral antibiotics. Dr Lilly Cove had a discussion with Dr. Gala Romney ,  cardiologist regarding her overall situation. He agrees that she would benefit from right heart catheterization in particular and may be a candidate for ultrafiltration. He has agreed to provide a consultation, with this in mind. Patient was transferred to [facility] on 12/22/11.     Consultants:  Cardiology.   Procedures:  CXR.  2 D Echocardiogram.  Subcutaneous fat biopsy.   Antibiotics:  Vancomycin 12/15/11-12/16/11.   Doxycycline 12/17/11>>>  HPI/Subjective: Feels somewhat better.   Objective: Vital signs in last 24 hours: Temp:  [97.2 F (36.2 C)-99 F (37.2 C)] 98 F (36.7 C) (10/27 0735) Pulse Rate:  [87-99] 87  (10/27 0600) Resp:  [15-22] 22  (10/27 0600) BP: (129-169)/(67-94) 130/77 mmHg (10/27 0600) SpO2:  [95 %-100 %] 99 % (10/27 0600) Weight:  [176.4 kg (388 lb 14.3 oz)-178 kg (392 lb 6.7 oz)] 176.4 kg (388 lb 14.3 oz) (10/27 0500) Weight change:  Last BM Date: 12/19/11  Intake/Output from previous day: 10/26 0701 - 10/27 0700 In: 816 [P.O.:800; IV Piggyback:16] Out: 6250 [Urine:6250] Total I/O In: -  Out: 1750 [Urine:1750]   Physical Exam: General: Comfortable, alert, communicative, fully oriented, not short of breath at rest.  HEENT:  Moderate clinical pallor, no jaundice, no conjunctival injection or discharge. NECK:  Supple, JVP not seen, secondary to fat neck, no carotid bruits, no palpable lymphadenopathy, no palpable goiter. CHEST:  Clinically clear to auscultation, no wheezes, no crackles. HEART:  Sounds 1 and 2 heard, normal, irregular, no murmurs. ABDOMEN:  Morbidly obese, soft, biopsy wound in RLQ is healing well, non-tender, has marked subcutaneous edema, unable to palpate organs. Normal bowel sounds. GENITALIA:  Not examined. LOWER EXTREMITIES:  Marked bilateral pitting edema, erythema, weeping and blebs, left lower  leg. MUSCULOSKELETAL SYSTEM:  Unremarkabl. CENTRAL NERVOUS SYSTEM:  No focal neurologic deficit on gross examination.  Lab Results: No results found for this basename: WBC:2,HGB:2,HCT:2,PLT:2 in the last 72 hours  Basename 12/22/11 0705  NA 133*  K 5.1  CL 97  CO2 25  GLUCOSE 95  BUN 76*  CREATININE 2.25*  CALCIUM 9.1   Recent Results (from the past 240 hour(s))  CULTURE, BLOOD (ROUTINE X 2)     Status: Normal   Collection Time   12/15/11 10:55 PM      Component Value Range Status Comment   Specimen Description BLOOD LEFT HAND   Final    Special Requests BOTTLES DRAWN AEROBIC AND ANAEROBIC 7CC   Final    Culture NO GROWTH 5 DAYS   Final    Report Status 12/20/2011 FINAL   Final   CULTURE, BLOOD (ROUTINE X 2)     Status: Normal   Collection Time   12/15/11 11:00 PM      Component Value Range Status Comment   Specimen Description BLOOD LEFT ARM   Final    Special Requests BOTTLES DRAWN AEROBIC ONLY 6CC   Final    Culture NO GROWTH 5 DAYS   Final    Report Status 12/20/2011 FINAL   Final   MRSA PCR SCREENING     Status: Normal   Collection Time   12/23/11  2:45 AM      Component Value Range Status Comment   MRSA by PCR NEGATIVE  NEGATIVE Final      Studies/Results: No results found.  Medications: Scheduled Meds:   . amLODipine  10 mg Oral Daily  . aspirin  81 mg Oral Daily  . colchicine  0.6 mg Oral Daily  . doxycycline  100 mg  Oral Q12H  . folic acid  1 mg Oral Daily  . furosemide  80 mg Intravenous Q12H  . heparin subcutaneous  5,000 Units Subcutaneous Q8H  . labetalol  400 mg Oral BID  . pantoprazole  40 mg Oral Daily  . predniSONE  20 mg Oral Q breakfast  . sodium chloride  3 mL Intravenous Q12H  . sodium chloride  3 mL Intravenous Q12H  . DISCONTD: predniSONE  40 mg Oral Q breakfast   Continuous Infusions:  PRN Meds:.acetaminophen, hydrALAZINE, HYDROcodone-acetaminophen, morphine injection, promethazine, sodium chloride, DISCONTD: lidocaine    LOS:  13 days   Ruqaya Strauss,CHRISTOPHER  [facility] Pager [phone]. If 8PM-8AM, please contact night-coverage at [email or web address], password [email or web address] 12/23/2011, 10:00 AM  LOS: 13 days

## 2011-12-24 ENCOUNTER — Encounter (HOSPITAL_COMMUNITY): Payer: Self-pay | Admitting: General Surgery

## 2011-12-24 ENCOUNTER — Inpatient Hospital Stay (HOSPITAL_COMMUNITY): Payer: Medicare Other

## 2011-12-24 DIAGNOSIS — M7989 Other specified soft tissue disorders: Secondary | ICD-10-CM

## 2011-12-24 DIAGNOSIS — D72829 Elevated white blood cell count, unspecified: Secondary | ICD-10-CM | POA: Diagnosis present

## 2011-12-24 DIAGNOSIS — M79609 Pain in unspecified limb: Secondary | ICD-10-CM

## 2011-12-24 DIAGNOSIS — N179 Acute kidney failure, unspecified: Secondary | ICD-10-CM | POA: Diagnosis not present

## 2011-12-24 DIAGNOSIS — I509 Heart failure, unspecified: Secondary | ICD-10-CM

## 2011-12-24 LAB — CBC
MCH: 28.8 pg (ref 26.0–34.0)
MCHC: 34.1 g/dL (ref 30.0–36.0)
Platelets: 328 10*3/uL (ref 150–400)
RDW: 16.7 % — ABNORMAL HIGH (ref 11.5–15.5)

## 2011-12-24 LAB — COMPREHENSIVE METABOLIC PANEL
ALT: 22 U/L (ref 0–35)
AST: 24 U/L (ref 0–37)
Albumin: 2.4 g/dL — ABNORMAL LOW (ref 3.5–5.2)
Calcium: 9.3 mg/dL (ref 8.4–10.5)
GFR calc Af Amer: 31 mL/min — ABNORMAL LOW (ref >90)
Glucose, Bld: 89 mg/dL (ref 70–99)
Potassium: 4.7 mEq/L (ref 3.5–5.1)
Sodium: 135 mEq/L (ref 135–145)
Total Protein: 7.7 g/dL (ref 6.0–8.3)

## 2011-12-24 LAB — BASIC METABOLIC PANEL
BUN: 81 mg/dL — ABNORMAL HIGH (ref 6–23)
Calcium: 9 mg/dL (ref 8.4–10.5)
Creatinine, Ser: 2.24 mg/dL — ABNORMAL HIGH (ref 0.50–1.10)
GFR calc Af Amer: 31 mL/min — ABNORMAL LOW (ref >90)
GFR calc non Af Amer: 26 mL/min — ABNORMAL LOW (ref >90)
Glucose, Bld: 99 mg/dL (ref 70–99)

## 2011-12-24 MED ORDER — SODIUM CHLORIDE 0.9 % IJ SOLN
10.0000 mL | Freq: Two times a day (BID) | INTRAMUSCULAR | Status: DC
  Administered 2011-12-24 – 2011-12-26 (×4): 10 mL
  Administered 2011-12-28: 20 mL
  Administered 2011-12-28: 10 mL
  Administered 2011-12-29: 20 mL
  Administered 2011-12-29 – 2011-12-30 (×2): 10 mL
  Administered 2011-12-31: 20 mL
  Administered 2011-12-31: 10 mL
  Administered 2012-01-01 – 2012-01-03 (×2): 20 mL

## 2011-12-24 MED ORDER — SODIUM CHLORIDE 0.9 % IJ SOLN
10.0000 mL | INTRAMUSCULAR | Status: DC | PRN
  Administered 2012-01-01: 10 mL
  Administered 2012-01-02 (×2): 20 mL
  Administered 2012-01-02: 10 mL
  Administered 2012-01-03: 20 mL

## 2011-12-24 MED ORDER — OXYCODONE-ACETAMINOPHEN 5-325 MG PO TABS
1.0000 | ORAL_TABLET | ORAL | Status: DC | PRN
  Administered 2011-12-24 – 2011-12-29 (×22): 2 via ORAL
  Filled 2011-12-24 (×23): qty 2

## 2011-12-24 MED ORDER — LEVOFLOXACIN IN D5W 750 MG/150ML IV SOLN
750.0000 mg | INTRAVENOUS | Status: DC
  Administered 2011-12-24 – 2011-12-26 (×3): 750 mg via INTRAVENOUS
  Filled 2011-12-24 (×3): qty 150

## 2011-12-24 MED ORDER — SODIUM CHLORIDE 0.9 % IV SOLN: INTRAVENOUS | Status: DC

## 2011-12-24 MED ORDER — LEVOFLOXACIN IN D5W 500 MG/100ML IV SOLN
500.0000 mg | INTRAVENOUS | Status: DC
  Filled 2011-12-24: qty 100
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
## 2011-12-24 NOTE — Consult Note (Signed)
ANTIBIOTIC CONSULT NOTE - INITIAL  Pharmacy Consult for Levaquin Indication: cellulitis  Allergies  Allergen Reactions  . Penicillins Hives    Childhood allergy    Patient Measurements: Height: 5\' 11"  (180.3 cm) Weight: 423 lb 4.5 oz (192 kg) (Pt is on bariatric bed. Yesterday was on regular bed.) IBW/kg (Calculated) : 70.8   Vital Signs: Temp: 97.8 F (36.6 C) (10/28 0808) Temp src: Oral (10/28 0808) BP: 142/94 mmHg (10/28 0808) Pulse Rate: 108  (10/28 0901) Intake/Output from previous day: 10/27 0701 - 10/28 0700 In: 1494.5 [P.O.:1320; I.V.:174.5] Out: 8175 [Urine:8175] Intake/Output from this shift: Total I/O In: 230 [P.O.:200; I.V.:30] Out: 1700 [Urine:1700]  Labs:  [facility] 12/24/11 0600 12/23/11 2344 12/23/11 0833  WBC 25.6* -- 28.6*  HGB 8.9* -- 8.3*  PLT 328 -- 301  LABCREA -- -- --  CREATININE 2.22* 2.24* 2.29*   Estimated Creatinine Clearance: 64.1 ml/min (by C-G formula based on Cr of 2.22).  Assessment: 40yo morbidly obese woman to begin levaquin for LLE cellulitis. Previously treated with vancomycin which was then changed to oral doxycycline. Gradual improvement noted in Dr. Alison Murray note 10/27 but now to broaden back antibiotic coverage.  10/19 Vanc >>10/21 10/21 PO Doxy>>10/28 10/28 IV Levaquin>> 10/19 bld x2-ng  Goal of Therapy:  Appropriate levaquin dosing  Plan:  1) Given weight and CrCl > 3ml/min, will change previously ordered levaquin to 750mg  IV q24 2) Follow renal function, clinical progress  Fredrik Rigger 12/24/2011,9:49 AM

## 2011-12-24 NOTE — Evaluation (Addendum)
Physical Therapy Evaluation Patient Details Name: [name] MRN: [id number] DOB: 1970/05/09 Today's Date: 12/24/2011 Time: 2952-8413 PT Time Calculation (min): 27 min  PT Assessment / Plan / Recommendation Clinical Impression  Patient is a 41 yo female admitted with LLE cellulitis.  Patient with increased pain limiting mobility.  Patient required max encouragement to participate with PT today, stating she had a busy day and needed to rest.  Patient will benefit from acute PT to maximize independence prior to discharge.  Recommend HHPT and RW at discharge.  Will need 24 hour assist initially.    PT Assessment  Patient needs continued PT services    Follow Up Recommendations  Home health PT;Supervision/Assistance - 24 hour    Does the patient have the potential to tolerate intense rehabilitation      Barriers to Discharge None      Equipment Recommendations  Rolling walker with 5" wheels (Bariatric RW)    Recommendations for Other Services     Frequency Min 3X/week    Precautions / Restrictions Precautions Precautions: Fall Restrictions Weight Bearing Restrictions: Yes LLE Weight Bearing: Weight bearing as tolerated   Pertinent Vitals/Pain Pain limiting mobility.      Mobility  Bed Mobility Bed Mobility: Rolling Left;Left Sidelying to Sit Rolling Left: 4: Min assist;With rail Left Sidelying to Sit: 4: Min assist;With rails;HOB elevated (Holding LLE on pillow) Details for Bed Mobility Assistance: Patient with increased pain limiting mobility.  Patient required min assist to move LLE, and min assist to help raise trunk from bed. Increased time with transition, with several rest breaks.  Patient unwilling to come to fully upright position due to pain and fatigue.  Able to hold herself in side-sitting position with LUE x 4 minutes. Transfers Transfers: Not assessed (patient declined)          PT Diagnosis: Difficulty walking;Generalized weakness;Acute pain  PT  Problem List: Decreased strength;Decreased activity tolerance;Decreased balance;Decreased mobility;Decreased knowledge of use of DME;Obesity;Decreased skin integrity;Pain PT Treatment Interventions: DME instruction;Gait training;Stair training;Functional mobility training;Patient/family education   PT Goals Acute Rehab PT Goals PT Goal Formulation: With patient Time For Goal Achievement: 12/31/11 Potential to Achieve Goals: Good Pt will go Supine/Side to Sit: with supervision;with HOB 0 degrees PT Goal: Supine/Side to Sit - Progress: Goal set today Pt will go Sit to Supine/Side: with supervision;with HOB 0 degrees PT Goal: Sit to Supine/Side - Progress: Goal set today Pt will go Sit to Stand: with supervision;with upper extremity assist PT Goal: Sit to Stand - Progress: Goal set today Pt will Transfer Bed to Chair/Chair to Bed: with supervision PT Transfer Goal: Bed to Chair/Chair to Bed - Progress: Goal set today Pt will Ambulate: >150 feet;with supervision;with least restrictive assistive device PT Goal: Ambulate - Progress: Goal set today Pt will Go Up / Down Stairs: 3-5 stairs;with supervision;with rail(s);with least restrictive assistive device PT Goal: Up/Down Stairs - Progress: Goal set today  Visit Information  Last PT Received On: 12/24/11 Assistance Needed: +2    Subjective Data  Subjective: "I'll be ready to work with you tomorrow.  I haven't had any sleep.  I don't want to get up today" Patient Stated Goal: To walk. To go home   Prior Functioning  Home Living Lives With: Family;Other (Comment) (Sister and friend) Available Help at Discharge: Family;Available 24 hours/day Type of Home: Mobile home Home Access: Stairs to enter Entrance Stairs-Number of Steps: 6 Entrance Stairs-Rails: Right;Left;Can reach both Home Layout: One level Bathroom Shower/Tub: Walk-in shower;Tub only;Door [employer]:  Standard Bathroom Accessibility: No Home Adaptive Equipment:  None Prior Function Level of Independence: Independent Able to Take Stairs?: Yes Driving: Yes Vocation: On disability Communication Communication: No difficulties Dominant Hand: Right    Cognition  Overall Cognitive Status: Appears within functional limits for tasks assessed/performed Arousal/Alertness: Awake/alert Orientation Level: Oriented X4 / Intact Behavior During Session: WFL for tasks performed Cognition - Other Comments: Question patient's awareness of deficits    Extremity/Trunk Assessment Right Upper Extremity Assessment RUE ROM/Strength/Tone: Within functional levels RUE Sensation: WFL - Light Touch RUE Coordination: WFL - gross/fine motor Left Upper Extremity Assessment LUE ROM/Strength/Tone: Within functional levels LUE Sensation: WFL - Light Touch LUE Coordination: WFL - gross/fine motor Right Lower Extremity Assessment RLE ROM/Strength/Tone: Deficits RLE ROM/Strength/Tone Deficits: Grossly 4-/5 RLE Sensation: WFL - Light Touch Left Lower Extremity Assessment LLE ROM/Strength/Tone: Deficits;Unable to fully assess;Due to pain LLE ROM/Strength/Tone Deficits: Strength grossly 3+/5 LLE Sensation: WFL - Light Touch   Balance    End of Session PT - End of Session Activity Tolerance: Patient limited by pain;Patient limited by fatigue Patient left: in bed;with call bell/phone within reach Nurse Communication: Mobility status  GP     Vena Austria 12/24/2011, 3:54 PM Durenda Hurt. Renaldo Fiddler, [facility] Pager [phone]

## 2011-12-24 NOTE — Progress Notes (Signed)
Peripherally Inserted Central Catheter/Midline Placement  The IV Nurse has discussed with the patient and/or persons authorized to consent for the patient, the purpose of this procedure and the potential benefits and risks involved with this procedure.  The benefits include less needle sticks, lab draws from the catheter and patient may be discharged home with the catheter.  Risks include, but not limited to, infection, bleeding, blood clot (thrombus formation), and puncture of an artery; nerve damage and irregular heat beat.  Alternatives to this procedure were also discussed.  PICC/Midline Placement Documentation        [name] 12/24/2011, 12:09 PM

## 2011-12-24 NOTE — Progress Notes (Signed)
[facility] Progress Note [place] TEAM 1 - Stepdown/ICU TEAM   RETAL TONKINSON ZOX:[id number] DOB: 04/17/1970 DOA: 12/10/2011 PCP: [name]., MD  Brief narrative: The patient is a morbidly obese 41 yo woman with a h/o HTN, poorly controlled, who was admitted with volume overload and because of being refractory to diuresis is admitted in [facility] for [facility] therapy. She has a h/o non-compliance. She has massive peripheral edema and has been obese for many years. She also has a h/o gout with flairs in the past.  Assessment/Plan:  Acute right heart failure/Anasarca/known diastolic dysfunction with chronic heart failure *Appreciate heart failure team assistance *Has diuresed just over 8000 cc since Lasix infusion initiated on 12/23/2011 and cardiology plans to continue the Lasix drip as long as renal function continues to improve *May require low-dose dopamine to support right heart *Main issue and patient's decompensated health status his compliance. It is documented she routinely misses home doses of medications but reports she is able to afford her medications.  Infiltrative cardiomyopathy *EF is preserved *She had elevated kappa and lambda on SPEP and UPEP but a fat pad biopsy was negative for amyloid  Pulmonary HTN-severe *Continuing Norvasc and consideration can be given to adding hydralazine for additional afterload reduction  Accelerated hypertension *Blood pressure is moderately controlled but seems to be improving as diuresis improves *Continue medications as above *ACE inhibitor or ARB secondary to ongoing renal failure  Atrial fibrillation with controlled ventricular response *Has significant left atrial enlargement *Duration unknown *After adequately diuresed consideration can be given to TEE/cardioversion *Cardiologist unclear if she's appropriate candidate for anticoagulation given her issues with compliance  Cellulitis of left  leg/Fever/ Leukocytosis *Currently on doxycycline but has not responded very well and has persistent high leukocytosis *Have changed to Levaquin and monitor for response may need to broaden later *Having significant pain so we'll adjust medications *Have asked wound ostomy care nurse to evaluate the patient *Significant unilateral left lower extremity edema - dopplers were checked but were inconclusive given patient's body habitus and pain in leg  Acute renal failure *With adequate diuresis creatinine is actually decreasing *Consistent with acute cardiorenal syndrome   Hyperbilirubinemia *Not obstructive in nature and likely secondary to passive hepatic congestion  Acute gouty arthropathy *Currently responding well to steroids - taper in process  Morbid obesity with BMI of 50.0-59.9, adult *May have underlying OSA/OHS *Also has striking masculine features which could be related to polycystic ovarian syndrome - hemoglobin A1c normal at 5.3 - will check total testosterone level - if elevated, will obtain US of ovaries  DVT prophylaxis: Subcutaneous heparin 3 times a day Code Status: Full Family Communication: Spoke to patient Disposition Plan: step down  Consultants: Heart failure team  Procedures: Skin and soft tissue biopsy of abdominal wall by Dr. Leticia Penna on 12/18/2011  Antibiotics: Doxycycline 10/21 >>> 10/28 Levaquin 10/28 >>>  HPI/Subjective: Patient alert and primarily complaining of significant left lower extremity pain. Denies chest pain or shortness of breath   Objective: Blood pressure 142/94, pulse 86, temperature 97.8 F (36.6 C), temperature source Oral, resp. rate 16, height 5\' 11"  (1.803 m), weight 192 kg (423 lb 4.5 oz), last menstrual period 12/01/2011, SpO2 96.00%.  Intake/Output Summary (Last 24 hours) at 12/24/11 1352 Last data filed at 12/24/11 1300  Gross per 24 hour  Intake  944.5 ml  Output  81191 ml  Net -9680.5 ml     Exam: General: No  acute respiratory distress Lungs: Clear to auscultation  bilaterally without wheezes or crackles - BS distant th/o  Cardiovascular: Regular rate and rhythm without murmur gallop or rub normal S1 and S2 Abdomen: Nontender, nondistended, soft, bowel sounds positive, no rebound, no ascites, no appreciable mass Extremities: No significant cyanosis, clubbing, or edema bilateral lower extremities  Data Reviewed: Basic Metabolic Panel:  Lab 12/24/11 1610 12/23/11 2344 12/23/11 0833 12/22/11 0705 12/20/11 0506  NA 135 132* 136 133* 133*  K 4.7 4.9 4.6 5.1 4.7  CL 96 94* 97 97 95*  CO2 29 28 27 25 25   GLUCOSE 89 99 69* 95 110*  BUN 81* 81* 81* 76* 69*  CREATININE 2.22* 2.24* 2.29* 2.25* 2.39*  CALCIUM 9.3 9.0 9.1 9.1 8.7  MG -- -- -- -- --  PHOS -- -- -- -- --   Liver Function Tests:  Lab 12/24/11 0600  AST 24  ALT 22  ALKPHOS 120*  BILITOT 3.3*  PROT 7.7  ALBUMIN 2.4*   CBC:  Lab 12/24/11 0600 12/23/11 0833  WBC 25.6* 28.6*  NEUTROABS -- --  HGB 8.9* 8.3*  HCT 26.1* 24.1*  MCV 84.5 84.3  PLT 328 301    Basename 12/14/11 2102 12/11/11 1330  PROBNP 10792.0* 10471.0*    Recent Results (from the past 240 hour(s))  CULTURE, BLOOD (ROUTINE X 2)     Status: Normal   Collection Time   12/15/11 10:55 PM      Component Value Range Status Comment   Specimen Description BLOOD LEFT HAND   Final    Special Requests BOTTLES DRAWN AEROBIC AND ANAEROBIC 7CC   Final    Culture NO GROWTH 5 DAYS   Final    Report Status 12/20/2011 FINAL   Final   CULTURE, BLOOD (ROUTINE X 2)     Status: Normal   Collection Time   12/15/11 11:00 PM      Component Value Range Status Comment   Specimen Description BLOOD LEFT ARM   Final    Special Requests BOTTLES DRAWN AEROBIC ONLY 6CC   Final    Culture NO GROWTH 5 DAYS   Final    Report Status 12/20/2011 FINAL   Final   MRSA PCR SCREENING     Status: Normal   Collection Time   12/23/11  2:45 AM      Component Value Range Status Comment   MRSA by  PCR NEGATIVE  NEGATIVE Final      Studies:  Recent x-ray studies have been reviewed in detail by the Attending Physician  Scheduled Meds:  Reviewed in detail by the Attending Physician   Junious Silk, ANP [facility] Office  [phone] Pager [phone]  On-Call/Text Page:      [email or web address]      password [email or web address]  If 7PM-7AM, please contact night-coverage [email or web address] Password [email or web address] 12/24/2011, 1:52 PM   LOS: 14 days   I have personally examined this patient and reviewed the entire database. I have reviewed the above note, made any necessary editorial changes, and agree with its content.  Lonia Blood, MD [facility]

## 2011-12-24 NOTE — Progress Notes (Signed)
VASCULAR LAB PRELIMINARY  PRELIMINARY  PRELIMINARY  PRELIMINARY  Right lower extremity venous duplex completed.    Preliminary report:  Severely limited study due to morbid obesity, swelling, and pain. No obvious evidence of DVT by color and Doppler in areas visualized. Inconclusive study.  Jimy Gates, RVS 12/24/2011, 1:10 PM

## 2011-12-24 NOTE — Progress Notes (Signed)
Advanced Heart Failure Rounding Note   Subjective:    41 yo with history of HTN, gout, morbid obesity, atrial fibrillation and diastolic heart failure.  Admitted to [facility] on 10/14 for massive volume overload.  Transferred to [facility] on 10/26 due to worsening renal function, weakness and volume overload.   Echo: diastolic heart failure with ejection fraction of 55-60%, severe LVH, Mildly dilated RV.  Mildly to mod dilated RA.  moderate pericardial effusion and echocardiographic changes consistent with a possible infiltrative disease.  PAPP 72 mmHg  Fat pad bx negative for amyloid.  Bone marrow aspiration and biopsy was planned for outpatient.  Cr 1.57>>>>2.43>>2.22 Net I/Os: -6.6 Liters  Wt inaccurate due to change to bariatric bed last night.  C/o leg pain.  No SOB/orthopnea/PND.  Leg edema improving.   Objective:   Weight Range:  Vital Signs:   Temp:  [97.8 F (36.6 C)-98.9 F (37.2 C)] 97.8 F (36.6 C) (10/28 0808) Pulse Rate:  [78-98] 98  (10/28 0500) Resp:  [17-21] 19  (10/28 0500) BP: (125-154)/(72-99) 142/94 mmHg (10/28 0808) SpO2:  [95 %-99 %] 98 % (10/28 0500) Weight:  [192 kg (423 lb 4.5 oz)] 192 kg (423 lb 4.5 oz) (10/28 0300) Last BM Date: 12/22/11  Weight change: Filed Weights   12/22/11 2000 12/23/11 0500 12/24/11 0300  Weight: 178 kg (392 lb 6.7 oz) 176.4 kg (388 lb 14.3 oz) 192 kg (423 lb 4.5 oz)    Intake/Output:   Intake/Output Summary (Last 24 hours) at 12/24/11 0835 Last data filed at 12/24/11 0600  Gross per 24 hour  Intake 1494.5 ml  Output   8175 ml  Net -6680.5 ml     Physical Exam: General:  Obese, well appearing. No resp difficulty. Strikingly female features HEENT: normal Neck: supple. JVP to earlobe. Carotids 2+ bilat; no bruits. No lymphadenopathy or thryomegaly appreciated. Cor: PMI nondisplaced. + probable RV lift Regular rate & rhythm. No rubs, gallops or murmurs. Lungs: decreased throughout Abdomen: obese, soft, nontender, + distention. No  hepatosplenomegaly. No bruits or masses. Good bowel sounds. Extremities: no cyanosis, clubbing, rash, 2-3+ pitting edema to thigh.  LLE wrapped. Cellulitis/lymphedema on L Neuro: alert & orientedx3, cranial nerves grossly intact. moves all 4 extremities w/o difficulty. Affect pleasant  Telemetry: AF 70-90s  Labs: Basic Metabolic Panel:  Lab 12/24/11 0454 12/23/11 2344 12/23/11 0833 12/22/11 0705 12/20/11 0506  NA 135 132* 136 133* 133*  K 4.7 4.9 4.6 5.1 4.7  CL 96 94* 97 97 95*  CO2 29 28 27 25 25   GLUCOSE 89 99 69* 95 110*  BUN 81* 81* 81* 76* 69*  CREATININE 2.22* 2.24* 2.29* 2.25* 2.39*  CALCIUM 9.3 9.0 9.1 -- --  MG -- -- -- -- --  PHOS -- -- -- -- --    Liver Function Tests:  Lab 12/24/11 0600  AST 24  ALT 22  ALKPHOS 120*  BILITOT 3.3*  PROT 7.7  ALBUMIN 2.4*   No results found for this basename: LIPASE:5,AMYLASE:5 in the last 168 hours No results found for this basename: AMMONIA:3 in the last 168 hours  CBC:  Lab 12/24/11 0600 12/23/11 0833  WBC 25.6* 28.6*  NEUTROABS -- --  HGB 8.9* 8.3*  HCT 26.1* 24.1*  MCV 84.5 84.3  PLT 328 301    Cardiac Enzymes: No results found for this basename: CKTOTAL:5,CKMB:5,CKMBINDEX:5,TROPONINI:5 in the last 168 hours  BNP: BNP (last 3 results)  Basename 12/14/11 2102 12/11/11 1330  PROBNP 10792.0* 10471.0*  Medications:     Scheduled Medications:    . amLODipine  10 mg Oral Daily  . aspirin  81 mg Oral Daily  . colchicine  0.6 mg Oral Daily  . doxycycline  100 mg Oral Q12H  . folic acid  1 mg Oral Daily  . heparin subcutaneous  5,000 Units Subcutaneous Q8H  . labetalol  400 mg Oral BID  . pantoprazole  40 mg Oral Daily  . predniSONE  20 mg Oral Q breakfast  . sodium chloride  3 mL Intravenous Q12H  . sodium chloride  3 mL Intravenous Q12H  . DISCONTD: furosemide  80 mg Intravenous Q12H    Infusions:    . furosemide (LASIX) infusion 10 mg/hr (12/24/11 0600)    PRN  Medications: acetaminophen, hydrALAZINE, HYDROcodone-acetaminophen, morphine injection, promethazine, sodium chloride   Assessment:   1. Acute on chronic diastolic HF 2. Right heart failure 3. Atrial fibrillation 4. Morbid obesity, BMI 54.8  5. HTN, uncontrolled 6. Acute on chronic renal failure (baseline Cr 1.4-1.5) 7. Cellulitis 8. Anemia  Plan/Discussion:    Her HF seems mostly R-sided in nature. Will ned to review echo from [facility].  She remains massively volume overloaded on exam.  She is responding well to lasix gtt at this time. Renal function improved slightly  Will plan to continue lasix gtt at this time.  If Cr begins to rise again will plan right heart cath to further evaluate. May need low-dose dopamine to support her R heart.   Will continue norvasc and labetolol for HTN.  No ACE-I/ARB at this time due to renal failure.  Can add hydralazine if need be.   She is currently in AF. (unclear duration). Question if this is contributing to her fluid overload.  There are no old EKGs to review.  After fluid removal may consider TEE/DCCV although compliance with meds is an issue so unsure if she would be candidate for anti-coag.  Will continue to discuss with her.  If cellulitis not responding to doxy, would consider IV antibiotics.   Main issue is going to be compliance.  She routinely misses doses of home meds frequently.  She says she can afford her meds.    O/W per primary team. She has striking masculine features, is it worth a hormonal evaluation?   Length of Stay: 14 Daniel Bensimhon,MD 8:39 AM

## 2011-12-24 NOTE — Evaluation (Signed)
Occupational Therapy Evaluation Patient Details Name: [name] MRN: [id number] DOB: Sep 19, 1970 Today's Date: 12/24/2011 Time: 4098-1191 OT Time Calculation (min): 30 min  OT Assessment / Plan / Recommendation Clinical Impression  Pt admitted in transfer from [facility] with fluid overload and possible plans for a cardiac cath who has the limitations listed below.  Pt would benefit from cont OT to increase I with all adls and adl transfers so she can d/c home.    OT Assessment  Patient needs continued OT Services    Follow Up Recommendations  Home health OT    Barriers to Discharge None    Equipment Recommendations  3 in 1 bedside comode;Other (comment) (bariatric 3:1.  may or may not need.  will evaluate)    Recommendations for Other Services    Frequency  Min 2X/week    Precautions / Restrictions Precautions Precautions: Other (comment) Precaution Comments: contact Restrictions Weight Bearing Restrictions: No Other Position/Activity Restrictions: pt has difficulty putting any weight through LLE   Pertinent Vitals/Pain Pt with 7/10 pain in L foot.  Pt received meds prior to OT session.    ADL  Eating/Feeding: Performed;Independent Where Assessed - Eating/Feeding: Bed level Grooming: Performed;Set up Where Assessed - Grooming: Supine, head of bed up Upper Body Bathing: Simulated;Set up Where Assessed - Upper Body Bathing: Supine, head of bed up Lower Body Bathing: Simulated;Moderate assistance Where Assessed - Lower Body Bathing: Lean right and/or left;Unsupported sitting Upper Body Dressing: Simulated;Set up Where Assessed - Upper Body Dressing: Unsupported sitting Lower Body Dressing: Simulated;Maximal assistance Where Assessed - Lower Body Dressing: Lean right and/or left;Unsupported sitting Toilet Transfer: Other (comment) (uable to stand) Transfers/Ambulation Related to ADLs: Pt refused to attempt to stand b/c of pain in L LE. ADL Comments: Pt does well with all  adls except when standing.  Le adls limited due to LLE pain.    OT Diagnosis: Acute pain;Generalized weakness  OT Problem List: Decreased activity tolerance;Decreased knowledge of use of DME or AE;Pain OT Treatment Interventions: Self-care/ADL training   OT Goals Acute Rehab OT Goals OT Goal Formulation: With patient Time For Goal Achievement: 01/07/12 Potential to Achieve Goals: Good ADL Goals Pt Will Perform Grooming: with modified independence;Standing at sink ADL Goal: Grooming - Progress: Goal set today Pt Will Perform Lower Body Bathing: with modified independence;Sit to stand from chair;Sitting at sink;Standing at sink ADL Goal: Lower Body Bathing - Progress: Goal set today Pt Will Perform Lower Body Dressing: with modified independence;Sit to stand from chair ADL Goal: Lower Body Dressing - Progress: Goal set today Pt Will Perform Tub/Shower Transfer: Shower transfer;with supervision ADL Goal: Tub/Shower Transfer - Progress: Goal set today Additional ADL Goal #1: Pt will complete all aspects of toileting with 3:1 over commode and mod I. ADL Goal: Additional Goal #1 - Progress: Goal set today  Visit Information  Last OT Received On: 12/24/11 Assistance Needed: +1    Subjective Data  Subjective: " I cannot stand today." Patient Stated Goal: to get better   Prior Functioning     Home Living Lives With: Family;Other (Comment) (friend sister) Available Help at Discharge: Family Type of Home: Mobile home Home Access: Stairs to enter Entrance Stairs-Number of Steps: 6 Entrance Stairs-Rails: Right;Left;Can reach both Home Layout: One level Bathroom Shower/Tub: Walk-in shower;Tub only;Door [employer] Toilet: Standard Bathroom Accessibility: No Home Adaptive Equipment: None Prior Function Level of Independence: Independent Able to Take Stairs?: Yes Driving: Yes Vocation: On disability Communication Communication: No difficulties Dominant Hand: Right  Vision/Perception Vision - Assessment Vision Assessment: Vision not tested   Cognition  Overall Cognitive Status: Appears within functional limits for tasks assessed/performed Arousal/Alertness: Awake/alert Orientation Level: Oriented X4 / Intact Behavior During Session: [facility] for tasks performed Cognition - Other Comments: Appears intact.    Extremity/Trunk Assessment Right Upper Extremity Assessment RUE ROM/Strength/Tone: Within functional levels RUE Sensation: WFL - Light Touch RUE Coordination: WFL - gross/fine motor Left Upper Extremity Assessment LUE ROM/Strength/Tone: Within functional levels LUE Sensation: WFL - Light Touch LUE Coordination: WFL - gross/fine motor     Mobility Bed Mobility Bed Mobility: Supine to Sit;Sit to Supine Supine to Sit: 4: Min assist;Other (comment) (assist holding LLE) Sit to Supine: 4: Min assist;Other (comment) (assist holding LLE) Details for Bed Mobility Assistance: Pt with so much pain in leg that therapist held leg on pillow as pt sat on side of bed.       Shoulder Instructions     Exercise     Balance     End of Session OT - End of Session Activity Tolerance: Patient limited by pain Patient left: in bed;with call bell/phone within reach Nurse Communication: Mobility status  GO     Hope Budds 12/24/2011, 11:15 AM [phone]

## 2011-12-25 DIAGNOSIS — I2789 Other specified pulmonary heart diseases: Secondary | ICD-10-CM

## 2011-12-25 LAB — BASIC METABOLIC PANEL
BUN: 81 mg/dL — ABNORMAL HIGH (ref 6–23)
CO2: 32 mEq/L (ref 19–32)
Chloride: 92 mEq/L — ABNORMAL LOW (ref 96–112)
GFR calc Af Amer: 30 mL/min — ABNORMAL LOW (ref >90)
Potassium: 4.5 mEq/L (ref 3.5–5.1)

## 2011-12-25 LAB — CARBOXYHEMOGLOBIN
Carboxyhemoglobin: 2.3 % — ABNORMAL HIGH (ref 0.5–1.5)
Methemoglobin: 1.1 % (ref 0.0–1.5)
O2 Saturation: 70.2 %
Total hemoglobin: 7.7 g/dL — ABNORMAL LOW (ref 12.0–16.0)

## 2011-12-25 MED ORDER — MILRINONE IN DEXTROSE 20 MG/100ML IV SOLN
0.2500 ug/kg/min | INTRAVENOUS | Status: DC
  Administered 2011-12-25 – 2011-12-26 (×4): 0.25 ug/kg/min via INTRAVENOUS
  Filled 2011-12-25 (×4): qty 100

## 2011-12-25 NOTE — Progress Notes (Signed)
Physical Therapy Treatment Patient Details Name: [name] MRN: [id number] DOB: 1971-01-19 Today's Date: 12/25/2011 Time: 0103-0126 PT Time Calculation (min): 23 min  PT Assessment / Plan / Recommendation Comments on Treatment Session  Pt agreeable to participate in PT session with no encouragement needed.  Pt reports mobility is limited by pain.  Pt able to perform OOB>recliner today but c/o LLE pain with all movement & does not tolerate weight bearing through LLE well.      Follow Up Recommendations  Home health PT;Supervision/Assistance - 24 hour     Does the patient have the potential to tolerate intense rehabilitation     Barriers to Discharge        Equipment Recommendations  Rolling walker with 5" wheels    Recommendations for Other Services    Frequency Min 3X/week   Plan Discharge plan remains appropriate    Precautions / Restrictions Precautions Precautions: Fall Precaution Comments: contact Restrictions Weight Bearing Restrictions: No LLE Weight Bearing: Weight bearing as tolerated    Pertinent Vitals/Pain 8/10 LLE.   RN notified & was bringing oral pain medication to pt at end of session    Mobility  Bed Mobility Bed Mobility: Supine to Sit;Sitting - Scoot to Edge of Bed Supine to Sit: 1: +2 Total assist;HOB elevated;With rails Supine to Sit: Patient Percentage: 70% Sitting - Scoot to Edge of Bed: 4: Min assist Details for Bed Mobility Assistance: Cues for sequencing & technique.  Pt stated she has difficulty getting out of bariatric bed.  (A) for LLE, use of draw pad to pivot hips, & hand-in-hand grasp with this Pt-Therapist to bring shoulders/trunk to sitting upright.   Transfers Transfers: Sit to Stand;Stand to Sit;Stand Pivot Transfers Sit to Stand: 1: +2 Total assist;With upper extremity assist;From bed Sit to Stand: Patient Percentage: 70% Stand to Sit: 4: Min assist;With armrests;To chair/3-in-1 Stand Pivot Transfers: 1: +2 Total assist Stand  Pivot Transfers: Patient Percentage: 70% Details for Transfer Assistance: cues for technique.  While sitting EOB pt not tolerating LLE in dependent position well & also has difficulty weight bearing through LLE due to pain.  Will trial RW next session to allow for UE support to (A) with off-loading weight from LE's.   Ambulation/Gait Ambulation/Gait Assistance: Not tested (comment)      PT Goals Acute Rehab PT Goals Time For Goal Achievement: 12/31/11 Potential to Achieve Goals: Good Pt will go Supine/Side to Sit: with supervision;with HOB 0 degrees PT Goal: Supine/Side to Sit - Progress: Not progressing Pt will go Sit to Supine/Side: with supervision;with HOB 0 degrees Pt will go Sit to Stand: with supervision;with upper extremity assist PT Goal: Sit to Stand - Progress: Progressing toward goal Pt will Transfer Bed to Chair/Chair to Bed: with supervision PT Transfer Goal: Bed to Chair/Chair to Bed - Progress: Progressing toward goal Pt will Ambulate: >150 feet;with supervision;with least restrictive assistive device Pt will Go Up / Down Stairs: 3-5 stairs;with supervision;with rail(s);with least restrictive assistive device  Visit Information  Last PT Received On: 12/25/11 Assistance Needed: +2    Subjective Data  Subjective: "It hurts" Patient Stated Goal: To walk. To go home   Cognition  Overall Cognitive Status: Appears within functional limits for tasks assessed/performed Arousal/Alertness: Awake/alert Orientation Level: Oriented X4 / Intact Behavior During Session: [facility] for tasks performed    Balance     End of Session PT - End of Session Equipment Utilized During Treatment: Gait belt Activity Tolerance: Patient limited by pain Patient left: in chair;with  call bell/phone within reach;with family/visitor present Nurse Communication: Mobility status    Verdell Face, [place] [phone] 12/25/2011

## 2011-12-25 NOTE — Progress Notes (Signed)
Advanced Heart Failure Rounding Note   Subjective:    41 yo with history of HTN, gout, morbid obesity, atrial fibrillation and diastolic heart failure.  Admitted to [facility] on 10/14 for massive volume overload.  Transferred to [facility] on 10/26 due to worsening renal function, weakness and volume overload.   Echo: diastolic heart failure with ejection fraction of 55-60%, severe LVH, Mildly dilated RV.  Mildly to mod dilated RA.  moderate pericardial effusion and echocardiographic changes consistent with a possible infiltrative disease.  PAPP 72 mmHg  Fat pad bx negative for amyloid.  Bone marrow aspiration and biopsy was planned for outpatient.  Cr 1.57>>>>2.43>>2.22>2.28.   Now on lasix drip. Net I/Os: -10 Liters yesterday.  Wt down 19 pounds overnight.  However she is on new bed and weight very different from previous weight.   Continues to c/o leg pain.  No SOB/orthopnea/PND.  Leg edema improving. PICC line placed yesterday. CVP 16 Co-ox 70%  Objective:   Weight Range:  Vital Signs:   Temp:  [97.6 F (36.4 C)-98.1 F (36.7 C)] 97.8 F (36.6 C) (10/29 0855) Pulse Rate:  [86-103] 101  (10/29 0855) Resp:  [16-22] 22  (10/29 0500) BP: (123-147)/(66-94) 134/74 mmHg (10/29 0855) SpO2:  [96 %-99 %] 96 % (10/29 0855) Weight:  [183.6 kg (404 lb 12.2 oz)] 183.6 kg (404 lb 12.2 oz) (10/29 0700) Last BM Date: 12/22/11  Weight change: Filed Weights   12/23/11 0500 12/24/11 0300 12/25/11 0700  Weight: 176.4 kg (388 lb 14.3 oz) 192 kg (423 lb 4.5 oz) 183.6 kg (404 lb 12.2 oz)    Intake/Output:   Intake/Output Summary (Last 24 hours) at 12/25/11 1025 Last data filed at 12/25/11 0858  Gross per 24 hour  Intake   1450 ml  Output  16109 ml  Net  -9725 ml     Physical Exam: General:  Obese, well appearing. No resp difficulty. Strikingly female features HEENT: normal Neck: supple. JVP hard to see but appearsto jaw. Carotids 2+ bilat; no bruits. No lymphadenopathy or thryomegaly  appreciated. Cor: PMI nondisplaced. + probable RV lift Regular rate & rhythm. No rubs, gallops or murmurs. Lungs: decreased throughout Abdomen: obese, soft, nontender, + distention. No hepatosplenomegaly. No bruits or masses. Good bowel sounds. Extremities: no cyanosis, clubbing, rash, 2+ pitting edema to thigh.  LLE wrapped. Cellulitis/lymphedema on L. RUE PICC Neuro: alert & orientedx3, cranial nerves grossly intact. moves all 4 extremities w/o difficulty. Affect pleasant  Telemetry: AF 70-90s  Labs: Basic Metabolic Panel:  Lab 12/25/11 6045 12/24/11 0600 12/23/11 2344 12/23/11 0833 12/22/11 0705  NA 136 135 132* 136 133*  K 4.5 4.7 4.9 4.6 5.1  CL 92* 96 94* 97 97  CO2 32 29 28 27 25   GLUCOSE 83 89 99 69* 95  BUN 81* 81* 81* 81* 76*  CREATININE 2.28* 2.22* 2.24* 2.29* 2.25*  CALCIUM 9.4 9.3 9.0 -- --  MG -- -- -- -- --  PHOS -- -- -- -- --    Liver Function Tests:  Lab 12/24/11 0600  AST 24  ALT 22  ALKPHOS 120*  BILITOT 3.3*  PROT 7.7  ALBUMIN 2.4*   No results found for this basename: LIPASE:5,AMYLASE:5 in the last 168 hours No results found for this basename: AMMONIA:3 in the last 168 hours  CBC:  Lab 12/24/11 0600 12/23/11 0833  WBC 25.6* 28.6*  NEUTROABS -- --  HGB 8.9* 8.3*  HCT 26.1* 24.1*  MCV 84.5 84.3  PLT 328 301  Cardiac Enzymes: No results found for this basename: CKTOTAL:5,CKMB:5,CKMBINDEX:5,TROPONINI:5 in the last 168 hours  BNP: BNP (last 3 results)  Basename 12/14/11 2102 12/11/11 1330  PROBNP 10792.0* 10471.0*       Medications:     Scheduled Medications:    . amLODipine  10 mg Oral Daily  . aspirin  81 mg Oral Daily  . colchicine  0.6 mg Oral Daily  . folic acid  1 mg Oral Daily  . heparin subcutaneous  5,000 Units Subcutaneous Q8H  . labetalol  400 mg Oral BID  . levofloxacin (LEVAQUIN) IV  750 mg Intravenous Q24H  . pantoprazole  40 mg Oral Daily  . predniSONE  20 mg Oral Q breakfast  . sodium chloride  10-40 mL  Intracatheter Q12H  . sodium chloride  3 mL Intravenous Q12H  . sodium chloride  3 mL Intravenous Q12H    Infusions:    . sodium chloride 10 mL/hr (12/24/11 2000)  . furosemide (LASIX) infusion 10 mg/hr (12/24/11 1900)  . milrinone 0.25 mcg/kg/min (12/25/11 0956)    PRN Medications: acetaminophen, hydrALAZINE, morphine injection, oxyCODONE-acetaminophen, promethazine, sodium chloride, sodium chloride   Assessment:   1. Acute on chronic diastolic HF 2. Right heart failure 3. Atrial fibrillation 4. Morbid obesity, BMI 54.8  5. HTN, uncontrolled 6. Acute on chronic renal failure (baseline Cr 1.4-1.5) 7. Cellulitis 8. Anemia  Plan/Discussion:     She remains volume overloaded on exam but responding briskly to lasix gtt. However Cr continues to drift up. 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 Co-ox is good. CVP up.   Will add milrinone to help support RV and bring down PA pressures. Will continue IV lasix.  Prior to d/c will need RHC to clearly define her hemodynamics.  She remains in AF of unclear duration.  Will consider TEE/DCCV after fluid is removed but will need to ensure she is compliant with anticoag.    Management of cellulitis per primary team.    Length of Stay: 15 Daniel Bensimhon,MD 10:30 AM

## 2011-12-25 NOTE — Progress Notes (Signed)
[facility] Progress Note [place] TEAM 1 - Stepdown/ICU TEAM   [name] YNW:[id number] DOB: 1971-01-23 DOA: 12/10/2011 PCP: [name]., MD  Brief narrative: The patient is a morbidly obese 41 yo woman with a h/o HTN, poorly controlled, who was admitted with volume overload and because of being refractory to diuresis is admitted in [facility] for ongoing therapy. She has a h/o non-compliance. She has massive peripheral edema and has been obese for many years. She also has a h/o gout with flairs in the past.  Assessment/Plan:  Acute right heart failure/Anasarca/known diastolic dysfunction with chronic heart failure *Appreciate heart failure team assistance *She is now diuresing well- Milrinone has been added today by Dr Gala Romney  Infiltrative cardiomyopathy *EF is preserved *She had elevated kappa and lambda on SPEP and UPEP but a fat pad biopsy was negative for amyloid  Pulmonary HTN-severe *Continuing Norvasc - Will need a RHC prior to d/c  Accelerated hypertension *Blood pressure is moderately controlled but seems to be improving as diuresis improves *No ACE inhibitor or ARB secondary to ongoing renal failure  Atrial fibrillation with controlled ventricular response *Has significant left atrial enlargement of unknown duration *After adequately diuresed consideration can be given to TEE/cardioversion- her extent of compliance with anticoagulation will need to be discussed with her prior to making a decision.   Cellulitis of left leg/Fever/ Leukocytosis Patient developed blisters due to significant edema- due to rupture of these blisters, infection set in.  Doxy switched to Levaquin yesterday.   wound ostomy care nurse has evaluated and made recommendations  dopplers were performed but were inconclusive given patient's body habitus and pain in leg  Acute renal failure *With adequate diuresis creatinine is actually  decreasing *Consistent with acute cardiorenal syndrome   Hyperbilirubinemia *Not obstructive in nature and likely secondary to passive hepatic congestion  Acute gouty arthropathy *Currently responding well to steroids - taper in process  Morbid obesity with BMI of 50.0-59.9, adult *May have underlying OSA/OHS *Also has striking masculine features which could be related to polycystic ovarian syndrome - hemoglobin A1c normal at 5.3 - total testosterone level is < 10   DVT prophylaxis: Subcutaneous heparin 3 times a day Code Status: Full Family Communication: Spoke to patient Disposition Plan: step down  Consultants: Heart failure team  Procedures: Skin and soft tissue biopsy of abdominal wall by Dr. Leticia Penna on 12/18/2011  Antibiotics: Doxycycline 10/21 >>> 10/28 Levaquin 10/28 >>>  HPI/Subjective: Pt states she is feeling well today. No significant dyspnea or pain in leg.    Objective: Blood pressure 95/40, pulse 95, temperature 98.5 F (36.9 C), temperature source Oral, resp. rate 22, height 5\' 11"  (1.803 m), weight 183.6 kg (404 lb 12.2 oz), last menstrual period 12/01/2011, SpO2 97.00%.  Intake/Output Summary (Last 24 hours) at 12/25/11 1614 Last data filed at 12/25/11 1400  Gross per 24 hour  Intake 1386.12 ml  Output   9900 ml  Net -8513.88 ml     Exam: General: No acute respiratory distress Lungs: Clear to auscultation bilaterally without wheezes or crackles - BS distant th/o  Cardiovascular: Regular rate and rhythm without murmur gallop or rub normal S1 and S2 Abdomen: Nontender, nondistended, soft, bowel sounds positive, no rebound, no ascites, no appreciable mass Extremities: No significant cyanosis, clubbing, or edema bilateral lower extremities  Data Reviewed: Basic Metabolic Panel:  Lab 12/25/11 6578 12/24/11 0600 12/23/11 2344 12/23/11 0833 12/22/11 0705  NA 136 135 132* 136 133*  K 4.5 4.7 4.9  4.6 5.1  CL 92* 96 94* 97 97  CO2 32 29 28 27 25    GLUCOSE 83 89 99 69* 95  BUN 81* 81* 81* 81* 76*  CREATININE 2.28* 2.22* 2.24* 2.29* 2.25*  CALCIUM 9.4 9.3 9.0 9.1 9.1  MG -- -- -- -- --  PHOS -- -- -- -- --   Liver Function Tests:  Lab 12/24/11 0600  AST 24  ALT 22  ALKPHOS 120*  BILITOT 3.3*  PROT 7.7  ALBUMIN 2.4*   CBC:  Lab 12/24/11 0600 12/23/11 0833  WBC 25.6* 28.6*  NEUTROABS -- --  HGB 8.9* 8.3*  HCT 26.1* 24.1*  MCV 84.5 84.3  PLT 328 301    Basename 12/14/11 2102 12/11/11 1330  PROBNP 10792.0* 10471.0*     Studies:  Recent x-ray studies have been reviewed in detail by the Attending Physician  Scheduled Meds:  Reviewed in detail by the Attending Physician   Calvert Cantor, MD [phone]   If 7PM-7AM, please contact night-coverage [email or web address] Password [email or web address] 12/25/2011, 4:14 PM   LOS: 15 days

## 2011-12-26 ENCOUNTER — Encounter (HOSPITAL_COMMUNITY)
Admission: EM | Disposition: A | Discharge status: Nursing Home (Includes ICF) | Payer: Self-pay | Source: Home / Self Care | Attending: Internal Medicine

## 2011-12-26 DIAGNOSIS — I279 Pulmonary heart disease, unspecified: Secondary | ICD-10-CM

## 2011-12-26 DIAGNOSIS — N179 Acute kidney failure, unspecified: Secondary | ICD-10-CM

## 2011-12-26 HISTORY — PX: RIGHT HEART CATHETERIZATION: SHX5447

## 2011-12-26 LAB — COMPREHENSIVE METABOLIC PANEL
ALT: 18 U/L (ref 0–35)
AST: 20 U/L (ref 0–37)
Albumin: 2.4 g/dL — ABNORMAL LOW (ref 3.5–5.2)
Alkaline Phosphatase: 134 U/L — ABNORMAL HIGH (ref 39–117)
Chloride: 91 mEq/L — ABNORMAL LOW (ref 96–112)
Creatinine, Ser: 2.35 mg/dL — ABNORMAL HIGH (ref 0.50–1.10)
Potassium: 3.9 mEq/L (ref 3.5–5.1)
Sodium: 135 mEq/L (ref 135–145)
Total Bilirubin: 3.1 mg/dL — ABNORMAL HIGH (ref 0.3–1.2)

## 2011-12-26 LAB — CBC
Hemoglobin: 7.3 g/dL — ABNORMAL LOW (ref 12.0–15.0)
MCV: 84 fL (ref 78.0–100.0)
Platelets: 301 10*3/uL (ref 150–400)
RBC: 2.63 MIL/uL — ABNORMAL LOW (ref 3.87–5.11)
WBC: 30.8 10*3/uL — ABNORMAL HIGH (ref 4.0–10.5)

## 2011-12-26 LAB — POCT I-STAT 3, VENOUS BLOOD GAS (G3P V)
Acid-Base Excess: 11 mmol/L — ABNORMAL HIGH (ref 0.0–2.0)
Acid-Base Excess: 11 mmol/L — ABNORMAL HIGH (ref 0.0–2.0)
Acid-Base Excess: 9 mmol/L — ABNORMAL HIGH (ref 0.0–2.0)
Bicarbonate: 33.9 mEq/L — ABNORMAL HIGH (ref 20.0–24.0)
Bicarbonate: 34.1 mEq/L — ABNORMAL HIGH (ref 20.0–24.0)
Bicarbonate: 35.3 mEq/L — ABNORMAL HIGH (ref 20.0–24.0)
Bicarbonate: 35.7 mEq/L — ABNORMAL HIGH (ref 20.0–24.0)
O2 Saturation: 72 %
TCO2: 34 mmol/L (ref 0–100)
TCO2: 37 mmol/L (ref 0–100)
TCO2: 37 mmol/L (ref 0–100)
pCO2, Ven: 45.1 mmHg (ref 45.0–50.0)
pH, Ven: 7.471 — ABNORMAL HIGH (ref 7.250–7.300)
pO2, Ven: 32 mmHg (ref 30.0–45.0)
pO2, Ven: 32 mmHg (ref 30.0–45.0)
pO2, Ven: 36 mmHg (ref 30.0–45.0)

## 2011-12-26 LAB — CARBOXYHEMOGLOBIN: O2 Saturation: 67.5 %

## 2011-12-26 LAB — CREATININE, SERUM
Creatinine, Ser: 2.37 mg/dL — ABNORMAL HIGH (ref 0.50–1.10)
GFR calc Af Amer: 29 mL/min — ABNORMAL LOW (ref >90)

## 2011-12-26 SURGERY — RIGHT HEART CATH
Anesthesia: LOCAL

## 2011-12-26 MED ORDER — HEPARIN SODIUM (PORCINE) 5000 UNIT/ML IJ SOLN
5000.0000 [IU] | Freq: Three times a day (TID) | INTRAMUSCULAR | Status: DC
  Administered 2011-12-26 – 2011-12-31 (×14): 5000 [IU] via SUBCUTANEOUS
  Filled 2011-12-26 (×17): qty 1

## 2011-12-26 MED ORDER — SODIUM CHLORIDE 0.9 % IV SOLN
INTRAVENOUS | Status: DC
  Administered 2011-12-25: 7 mL/h via INTRAVENOUS
  Administered 2011-12-31: 20:00:00 via INTRAVENOUS

## 2011-12-26 MED ORDER — ACETAMINOPHEN 325 MG PO TABS: 650.0000 mg | ORAL_TABLET | ORAL | Status: DC | PRN

## 2011-12-26 MED ORDER — ONDANSETRON HCL 4 MG/2ML IJ SOLN: 4.0000 mg | Freq: Four times a day (QID) | INTRAMUSCULAR | Status: DC | PRN

## 2011-12-26 NOTE — Progress Notes (Addendum)
Patient's standing weight today (10/30): 150.3kg - attempted to get bed weight on BariBed to compare weights, but pt refused.  Review of past weights: 10/29: 183.6kg - using BariBed scale 10/28: 192kg - using BariBed scale 10/27: 176.2kg - on regualr SDU bed 10/26: 178kg - on regular SDU bed

## 2011-12-26 NOTE — Progress Notes (Signed)
[facility] Progress Note [place] TEAM 1 - Stepdown/ICU TEAM   [name] WUJ:[id number] DOB: [birth year] DOA: 12/10/2011 PCP: [name]., MD  Brief narrative: The patient is a morbidly obese 41 yo woman with a h/o HTN, poorly controlled, who was admitted with volume overload and because of being refractory to diuresis is admitted in [facility] for [facility] therapy. She has a h/o non-compliance. She has massive peripheral edema and has been obese for many years. She also has a h/o gout with flairs in the past.  Assessment/Plan:  Acute right heart failure/Anasarca/known diastolic dysfunction with chronic heart failure *Appreciate heart failure team assistance *Now 33 liters negative!  Infiltrative cardiomyopathy *EF is preserved *She had elevated kappa and lambda on SPEP and UPEP but a fat pad biopsy was negative for amyloid  Pulmonary HTN-severe *Continuing Norvasc - for RHC today   Accelerated hypertension *Blood pressure is moderately controlled but seems to be improving as diuresis improves *No ACE inhibitor or ARB secondary to ongoing renal failure  Atrial fibrillation with controlled ventricular response *Has significant left atrial enlargement of unknown duration *After adequately diuresed consideration can be given to TEE/cardioversion- her extent of compliance with anticoagulation will need to be discussed with her prior to making a decision.   Cellulitis of left leg/Fever/ Leukocytosis *Doxy switched to Levaquin 10/28  *wound ostomy care nurse has evaluated and made recommendations *dopplers were performed but were inconclusive given patient's body habitus and pain in leg *Pain greatly improved with decreasing edema and adjustments in pain meds on 10/29 * I am becoming more suspicious that the LE findings are more related to chronic venous stasis than a true infection - will d/c abx and follow clinically   Acute renal  failure *With adequate diuresis creatinine initially was decreasing but is now increasing and she has developed some tachycardia so may be close to dry weight   Hyperbilirubinemia *Not obstructive in nature and likely secondary to passive hepatic congestion  Acute gouty arthropathy *Currently responding well to steroids - taper in process  Morbid obesity with BMI of 50.0-59.9, adult *May have underlying OSA/OHS *Also has striking masculine features - total testosterone level is < 10 making PCOD very unlikely  DVT prophylaxis: Subcutaneous heparin 3 times a day Code Status: Full Family Communication: Spoke to patient Disposition Plan: step down  Consultants: Heart failure team  Procedures: Skin and soft tissue biopsy of abdominal wall by Dr. Leticia Penna on 12/18/2011  Antibiotics: Doxycycline 10/21 >>> 10/28 Levaquin 10/28 >>>10/30  HPI/Subjective: Pt states she is feeling well today. No significant dyspnea or pain in leg.    Objective: Blood pressure 105/54, pulse 102, temperature 97.8 F (36.6 C), temperature source Oral, resp. rate 22, height 5\' 11"  (1.803 m), weight 183.6 kg (404 lb 12.2 oz), last menstrual period 12/01/2011, SpO2 94.00%.  Intake/Output Summary (Last 24 hours) at 12/26/11 1048 Last data filed at 12/26/11 0910  Gross per 24 hour  Intake 1547.6 ml  Output   5650 ml  Net -4102.4 ml     Exam: General: No acute respiratory distress Lungs: Clear to auscultation bilaterally without wheezes or crackles - BS distant th/o  Cardiovascular: Regular rate and rhythm without murmur gallop or rub normal S1 and S2, still has significant edema bilateral legs but has decreased significantly in the past 48 hrs, Lasix and Milrinone infusions Abdomen: Nontender, nondistended, soft, bowel sounds positive, no rebound, no ascites, no appreciable mass Musculoskeletal: No significant cyanosis, clubbing of bilateral lower extremities  Neurological: Alert and oriented x 3, MOE x  4, non focal  Data Reviewed: Basic Metabolic Panel:  Lab 12/26/11 4098 12/25/11 0500 12/24/11 0600 12/23/11 2344 12/23/11 0833  NA 135 136 135 132* 136  K 3.9 4.5 4.7 4.9 4.6  CL 91* 92* 96 94* 97  CO2 33* 32 29 28 27   GLUCOSE 118* 83 89 99 69*  BUN 83* 81* 81* 81* 81*  CREATININE 2.35* 2.28* 2.22* 2.24* 2.29*  CALCIUM 9.1 9.4 9.3 9.0 9.1  MG -- -- -- -- --  PHOS -- -- -- -- --   Liver Function Tests:  Lab 12/26/11 0450 12/24/11 0600  AST 20 24  ALT 18 22  ALKPHOS 134* 120*  BILITOT 3.1* 3.3*  PROT 7.6 7.7  ALBUMIN 2.4* 2.4*   CBC:  Lab 12/24/11 0600 12/23/11 0833  WBC 25.6* 28.6*  NEUTROABS -- --  HGB 8.9* 8.3*  HCT 26.1* 24.1*  MCV 84.5 84.3  PLT 328 301    Basename 12/14/11 2102 12/11/11 1330  PROBNP 10792.0* 10471.0*     Studies:  Recent x-ray studies have been reviewed in detail by the Attending Physician  Scheduled Meds:  Reviewed in detail by the Attending Physician  Rubie Maid, ANP PAGER: [phone] [facility] OFFICE: [phone]   If 7PM-7AM, please contact night-coverage [email or web address] Password [email or web address] 12/26/2011, 10:48 AM   LOS: 16 days    I have personally examined this patient and reviewed the entire database. I have reviewed the above note, made any necessary editorial changes, and agree with its content.  Lonia Blood, MD [facility]

## 2011-12-26 NOTE — CV Procedure (Signed)
Cardiac Cath Procedure Note:  Indication:   Procedures performed:  1) Right heart catheterization  Description of procedure:   The risks and indication of the procedure were explained. Consent was signed and placed on the chart. An appropriate timeout was taken prior to the procedure. The right neck was prepped and draped in the routine sterile fashion and anesthetized with 1% local lidocaine.   A 7 FR venous sheath was placed in the right internal jugular vein using a modified Seldinger technique. A standard Swan-Ganz catheter was used for the procedure.   Complications: None apparent.  Findings:  Done on milrinone 0.25 mcg/kg/min  RA = 16 RV =  81/14/20 PA =  78/32 (52) PCW = 21 Fick cardiac output/index = 11.9/4.6 Thermo CO/CI = 9.8/3.8 PVR =  2.6 Woods (Fick) FA sat = 91% PA sat = 65%, 72% SVC sat = 73%  RA sat = pending  Assessment:  1) Moderate to severe PAH in the setting of high output 2) Normal PVR 3) Very mildly elevated left sided pressures 4) No evidence of intracardiac shunting  Plan/Discussion:  She has severe pulmonary HTN with a high cardiac output and normal PVR on milrinone. Based on current numbers it appears her pulmonary HTN is due to a high output state. We will leave swan in and stop milrinone and see what numbers do. I am unclear why her renal function is so bad with normal cardiac output. Have slowed rate of lasix and will see if this improves.   Rosie Torrez 2:38 PM

## 2011-12-26 NOTE — Progress Notes (Signed)
Advanced Heart Failure Rounding Note   Subjective:    41 yo with history of HTN, gout, morbid obesity, atrial fibrillation and diastolic heart failure.  Admitted to [facility] on 10/14 for massive volume overload.  Transferred to [facility] on 10/26 due to worsening renal function, weakness and volume overload.   Echo: diastolic heart failure with ejection fraction of 55-60%, severe LVH, Mildly dilated RV.  Mildly to mod dilated RA.  moderate pericardial effusion and echocardiographic changes consistent with a possible infiltrative disease.  PAPP 72 mmHg  Fat pad bx negative for amyloid.  Bone marrow aspiration and biopsy was planned for outpatient.  Cr 1.57>>>>2.43>>2.22>2.28>2.35  Continues on lasix drip at 10. Net I/Os: - 6.8 liters yesterday.  Weight unable to compare today due to using standing scale vs baribed but apparently weight now 150kg on standing scale  Leg pain improving.  Up in chair.  No SOB/orthopnea/PND.  CVP 12 Co-ox 67%  Objective:    Vital Signs:   Temp:  [97.8 F (36.6 C)-98.5 F (36.9 C)] 97.8 F (36.6 C) (10/30 0725) Pulse Rate:  [95-104] 102  (10/30 0725) Resp:  [15-22] 22  (10/30 0725) BP: (95-112)/(40-71) 105/54 mmHg (10/30 0725) SpO2:  [93 %-97 %] 94 % (10/30 0725) Last BM Date: 12/25/11  Weight change: Filed Weights   12/23/11 0500 12/24/11 0300 12/25/11 0700  Weight: 176.4 kg (388 lb 14.3 oz) 192 kg (423 lb 4.5 oz) 183.6 kg (404 lb 12.2 oz)    Intake/Output:   Intake/Output Summary (Last 24 hours) at 12/26/11 1003 Last data filed at 12/26/11 0910  Gross per 24 hour  Intake 1547.6 ml  Output   5650 ml  Net -4102.4 ml     Physical Exam: General:  Obese, well appearing. No resp difficulty. Strikingly female features HEENT: normal Neck: supple. JVP hard to see but appears to jaw. Carotids 2+ bilat; no bruits. No lymphadenopathy or thryomegaly appreciated. Cor: PMI nondisplaced. + probable RV lift Regular rate & rhythm. No rubs, gallops or murmurs. Lungs:  decreased throughout Abdomen: obese, soft, nontender, + distention. No hepatosplenomegaly. No bruits or masses. Good bowel sounds. Extremities: no cyanosis, clubbing, rash, 1+ pitting edema to thigh.  LLE wrapped. Cellulitis/lymphedema on L. RUE PICC Neuro: alert & orientedx3, cranial nerves grossly intact. moves all 4 extremities w/o difficulty. Affect pleasant  Telemetry: AF 70-90s  Labs: Basic Metabolic Panel:  Lab 12/26/11 2952 12/25/11 0500 12/24/11 0600 12/23/11 2344 12/23/11 0833  NA 135 136 135 132* 136  K 3.9 4.5 4.7 4.9 4.6  CL 91* 92* 96 94* 97  CO2 33* 32 29 28 27   GLUCOSE 118* 83 89 99 69*  BUN 83* 81* 81* 81* 81*  CREATININE 2.35* 2.28* 2.22* 2.24* 2.29*  CALCIUM 9.1 9.4 9.3 -- --  MG -- -- -- -- --  PHOS -- -- -- -- --    Liver Function Tests:  Lab 12/26/11 0450 12/24/11 0600  AST 20 24  ALT 18 22  ALKPHOS 134* 120*  BILITOT 3.1* 3.3*  PROT 7.6 7.7  ALBUMIN 2.4* 2.4*   No results found for this basename: LIPASE:5,AMYLASE:5 in the last 168 hours No results found for this basename: AMMONIA:3 in the last 168 hours  CBC:  Lab 12/24/11 0600 12/23/11 0833  WBC 25.6* 28.6*  NEUTROABS -- --  HGB 8.9* 8.3*  HCT 26.1* 24.1*  MCV 84.5 84.3  PLT 328 301    Cardiac Enzymes: No results found for this basename: CKTOTAL:5,CKMB:5,CKMBINDEX:5,TROPONINI:5 in the last 168 hours  BNP: BNP (last 3 results)  Basename 12/14/11 2102 12/11/11 1330  PROBNP 10792.0* 10471.0*       Medications:     Scheduled Medications:    . amLODipine  10 mg Oral Daily  . aspirin  81 mg Oral Daily  . colchicine  0.6 mg Oral Daily  . folic acid  1 mg Oral Daily  . heparin subcutaneous  5,000 Units Subcutaneous Q8H  . labetalol  400 mg Oral BID  . levofloxacin (LEVAQUIN) IV  750 mg Intravenous Q24H  . pantoprazole  40 mg Oral Daily  . predniSONE  20 mg Oral Q breakfast  . sodium chloride  10-40 mL Intracatheter Q12H  . sodium chloride  3 mL Intravenous Q12H  . sodium  chloride  3 mL Intravenous Q12H    Infusions:    . sodium chloride 10 mL/hr at 12/25/11 2000  . sodium chloride 7 mL/hr (12/25/11 2000)  . furosemide (LASIX) infusion 10 mg/hr (12/25/11 2000)  . milrinone 0.25 mcg/kg/min (12/26/11 0843)    PRN Medications: acetaminophen, hydrALAZINE, morphine injection, oxyCODONE-acetaminophen, promethazine, sodium chloride, sodium chloride   Assessment:   1. Acute on chronic diastolic HF 2. Right heart failure 3. Atrial fibrillation 4. Morbid obesity, BMI 54.8  5. HTN, uncontrolled 6. Acute on chronic renal failure (baseline Cr 1.4-1.5) 7. Cellulitis 8. Anemia  Plan/Discussion:    She remains volume overloaded on exam but improving markedly on milrinone and lasix gtt.  Co-ox 67% which suggests adequate cardiac output but renal function continue to decline so will plan for RHC later this afternoon to define hemodynamics - to assess R vs L HF. Will cut lasix down to 5mg /hr.   I am skeptical that she will be compliant with medications therefore will hold off on TEE/DCCV at this time.    Length of Stay: 16 Kyley Laurel,MD 10:04 AM

## 2011-12-27 DIAGNOSIS — I509 Heart failure, unspecified: Secondary | ICD-10-CM

## 2011-12-27 DIAGNOSIS — D649 Anemia, unspecified: Secondary | ICD-10-CM | POA: Diagnosis not present

## 2011-12-27 DIAGNOSIS — D7589 Other specified diseases of blood and blood-forming organs: Secondary | ICD-10-CM

## 2011-12-27 LAB — DIFFERENTIAL
Basophils Absolute: 0 10*3/uL (ref 0.0–0.1)
Eosinophils Absolute: 0.2 10*3/uL (ref 0.0–0.7)
Lymphs Abs: 2 10*3/uL (ref 0.7–4.0)
Monocytes Absolute: 1.3 10*3/uL — ABNORMAL HIGH (ref 0.1–1.0)
Neutro Abs: 23.8 10*3/uL — ABNORMAL HIGH (ref 1.7–7.7)
Neutrophils Relative %: 87 % — ABNORMAL HIGH (ref 43–77)

## 2011-12-27 LAB — BASIC METABOLIC PANEL
Chloride: 93 mEq/L — ABNORMAL LOW (ref 96–112)
GFR calc Af Amer: 29 mL/min — ABNORMAL LOW (ref >90)
Potassium: 4 mEq/L (ref 3.5–5.1)

## 2011-12-27 LAB — CBC
HCT: 22.2 % — ABNORMAL LOW (ref 36.0–46.0)
Platelets: 281 10*3/uL (ref 150–400)
RDW: 16.7 % — ABNORMAL HIGH (ref 11.5–15.5)
WBC: 29.1 10*3/uL — ABNORMAL HIGH (ref 4.0–10.5)

## 2011-12-27 LAB — PROCALCITONIN: Procalcitonin: 1.26 ng/mL

## 2011-12-27 MED ORDER — FERUMOXYTOL INJECTION 510 MG/17 ML
510.0000 mg | Freq: Once | INTRAVENOUS | Status: AC
  Administered 2011-12-28: 510 mg via INTRAVENOUS
  Filled 2011-12-27: qty 17

## 2011-12-27 MED ORDER — SODIUM CHLORIDE 0.9 % IV SOLN: INTRAVENOUS | Status: DC | PRN

## 2011-12-27 NOTE — Progress Notes (Signed)
Patient ID: [name], female   DOB: Sep 12, 1970, 41 y.o.   MRN: [id number] Request received for CT guided bone marrow biopsy on pt with hx of anemia, leukocytosis, hypergammaglobulinemia. Additional PMH as below.   Exam: pt awake/alert; chest -CTA bilat; heart-  irreg ; abd- obese, soft, +BS,NT Filed Vitals:   12/27/11 1525 12/27/11 1530 12/27/11 1600 12/27/11 1630  BP:  133/85  148/82  Pulse: 93 90 84 85  Temp: 98.4 F (36.9 C) 98.4 F (36.9 C) 98.4 F (36.9 C) 98.6 F (37 C)  TempSrc: Core (Comment)  Core (Comment)   Resp: 18 16 16 16   Height:      Weight:      SpO2:   95%    Past Medical History  Diagnosis Date  . Essential hypertension, benign     History of accelerated hypertension with urgency  . Atrial flutter     Documented 2006 - [facility]  . Gout   . History of cardiac catheterization     Ectatic coronaries without obstruction 2006 - [facility]  . Morbid obesity   . Hypertensive heart disease     LVEF reportedly 50-55% in 2006 - [facility]  . CKD (chronic kidney disease) stage 3, GFR 30-59 ml/min    Past Surgical History  Procedure Date  . Skin biopsy 12/18/2011    Procedure: BIOPSY SKIN;  Surgeon: Fabio Bering, MD;  Location: [facility];  Service: General;  Laterality: N/A;  in the minor room.  US Abdomen Complete  12/14/2011  *RADIOLOGY REPORT*  Clinical Data:  Amyloidosis.  Evaluate for organomegaly  COMPLETE ABDOMINAL ULTRASOUND  Comparison:  Renal ultrasound, 2006  Findings:  Gallbladder:  The gallbladder is distended and shows no intraluminal stones or sludge.  The gallbladder wall appears thickened with a width of 6.1 mm and has a tri- layered appearance suggesting the presence of wall edema.  Evaluation for a sonographic Murphy's sign is negative.  Common bile duct:  Measures 4.3 mm in diameter and has a normal appearance  Liver:  Is enlarged with a sagittal length of 24.1 cm in the midclavicular line no focal parenchymal abnormality is seen and overall echotexture is  slightly diminished relative to the kidney. No signs of intrahepatic ductal dilatation are noted  IVC:  The proximal portion appears normal  Pancreas:  Is poorly visualized due to shadowing from overlying gas  Spleen:  Has a length of 14.4 cm and overall volume of 802 cc compatible with splenomegaly sonographically.  The spleen demonstrates a diffuse decrease in echotexture and this finding has been described in amyloidosis.  No focal abnormality is seen  Right Kidney:  Demonstrates a sagittal length of 13.1 cm.  A mild diffuse increase in cortical echotexture is seen with no parenchymal loss.  This may be related to amyloidosis deposition or associated with the underlying chronic renal disease.  No focal abnormalities or signs of hydronephrosis are noted  Left Kidney:  Has a sagittal length of 11.6 cm.  The overall echotexture is mildly increased with no focal parenchymal abnormality or hydronephrosis noted  Abdominal aorta:  The distal portion of the aorta is obscured by overlying gas.  The visualized portion has a maximal caliber of 2.8 cm with no aneurysmal dilatation seen  Other:  A small amount of ascites is identified in the upper abdomen.  IMPRESSION: Hepatomegaly and splenomegaly with mild decrease in echotexture suspected diffusely for both organs. 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  Slight increase in renal echotexture bilaterally.  This has been described with amyloidosis deposition but may also be secondary to the known chronic renal disease.  No focal renal abnormalities are seen.  Small amount of ascites.  Thickening of the gallbladder wall with a tri-layered pattern suggesting wall edema.  Given the presence of ascites and clinical history of anasarca, this is likely due to third spacing. In the appropriate clinical setting, this can be an indicator of acute acalculous cholecystitis.   Original Report Authenticated By: Bertha Stakes, M.D.    Dg Chest Port 1 View  12/24/2011  *RADIOLOGY REPORT*  Clinical Data: Status post PICC line placement  PORTABLE CHEST - 1 VIEW  Comparison: 12/10/2011  Findings: The heart is stable in appearance.  The lungs are clear. A new right-sided PICC line is noted with catheter tip in the mid superior vena cava just below the carina.  No other focal abnormality is noted.  IMPRESSION: PICC line on the right with the tip as described.   Original Report Authenticated By: Phillips Odor, M.D.    Dg Chest Portable 1 View  12/10/2011  *RADIOLOGY REPORT*  Clinical Data: Chest pain and shortness of breath.  Bilateral lower extremity swelling.  PORTABLE CHEST - 1 VIEW  Comparison: 01/21/2005  Findings: Shallow inspiration.  Cardiac enlargement.  Pulmonary vascularity is normal.  No focal airspace consolidation in the lungs.  No blunting of costophrenic angles.  No pneumothorax. Mediastinal contours appear intact.  Right paratracheal fullness is likely vascular.  No significant change since previous study.  IMPRESSION: Cardiac enlargement without pulmonary vascular congestion.  Lungs clear.   Original Report Authenticated By: Marlon Pel, M.D.   Results for orders placed during the hospital encounter of 12/10/11  CBC WITH DIFFERENTIAL      Component Value Range   WBC 10.3  4.0 - 10.5 K/uL   RBC 3.85 (*) 3.87 - 5.11 MIL/uL   Hemoglobin 11.3 (*) 12.0 - 15.0 g/dL   HCT 16.1 (*) 09.6 - 04.5 %   MCV 91.2  78.0 - 100.0 fL   MCH 29.4  26.0 - 34.0 pg   MCHC 32.2  30.0 - 36.0 g/dL   RDW 40.9 (*) 81.1 - 91.4 %   Platelets 298  150 - 400 K/uL   Neutrophils Relative 77  43 - 77 %   Neutro Abs 7.9 (*) 1.7 - 7.7 K/uL   Lymphocytes Relative 13  12 - 46 %   Lymphs Abs 1.5  0.7 - 4.0 K/uL   Monocytes Relative 6  3 - 12 %   Monocytes Absolute 0.6  0.1 - 1.0 K/uL   Eosinophils Relative 3  0 - 5 %   Eosinophils Absolute 0.3  0.0 - 0.7 K/uL   Basophils Relative 1  0 - 1 %   Basophils Absolute 0.1  0.0 -  0.1 K/uL  COMPREHENSIVE METABOLIC PANEL      Component Value Range   Sodium 138  135 - 145 mEq/L   Potassium 3.7  3.5 - 5.1 mEq/L   Chloride 101  96 - 112 mEq/L   CO2 22  19 - 32 mEq/L   Glucose, Bld 92  70 - 99 mg/dL   BUN 20  6 - 23 mg/dL   Creatinine, Ser 7.82 (*) 0.50 - 1.10 mg/dL   Calcium 9.5  8.4 - 95.6 mg/dL   Total Protein 8.7 (*) 6.0 - 8.3 g/dL   Albumin 3.3 (*) 3.5 -  5.2 g/dL   AST 16  0 - 37 U/L   ALT 6  0 - 35 U/L   Alkaline Phosphatase 114  39 - 117 U/L   Total Bilirubin 2.5 (*) 0.3 - 1.2 mg/dL   GFR calc non Af Amer 44 (*) >90 mL/min   GFR calc Af Amer 51 (*) >90 mL/min  TROPONIN I      Component Value Range   Troponin I <0.30  <0.30 ng/mL  URINALYSIS, ROUTINE W REFLEX MICROSCOPIC      Component Value Range   Color, Urine YELLOW  YELLOW   APPearance CLEAR  CLEAR   Specific Gravity, Urine 1.015  1.005 - 1.030   pH 5.5  5.0 - 8.0   Glucose, UA NEGATIVE  NEGATIVE mg/dL   Hgb urine dipstick LARGE (*) NEGATIVE   Bilirubin Urine NEGATIVE  NEGATIVE   Ketones, ur NEGATIVE  NEGATIVE mg/dL   Protein, ur NEGATIVE  NEGATIVE mg/dL   Urobilinogen, UA 0.2  0.0 - 1.0 mg/dL   Nitrite NEGATIVE  NEGATIVE   Leukocytes, UA NEGATIVE  NEGATIVE  URIC ACID      Component Value Range   Uric Acid, Serum 11.0 (*) 2.4 - 7.0 mg/dL  LIPID PANEL      Component Value Range   Cholesterol 107  0 - 200 mg/dL   Triglycerides 77  <811 mg/dL   HDL 21 (*) >91 mg/dL   Total CHOL/HDL Ratio 5.1     VLDL 15  0 - 40 mg/dL   LDL Cholesterol 71  0 - 99 mg/dL  TSH      Component Value Range   TSH 4.896 (*) 0.350 - 4.500 uIU/mL  BILIRUBIN, FRACTIONATED(TOT/DIR/INDIR)      Component Value Range   Total Bilirubin 2.8 (*) 0.3 - 1.2 mg/dL   Bilirubin, Direct 1.6 (*) 0.0 - 0.3 mg/dL   Indirect Bilirubin 1.2 (*) 0.3 - 0.9 mg/dL  VITAMIN Y78      Component Value Range   Vitamin B-12 793  211 - 911 pg/mL  FOLATE      Component Value Range   Folate 3.6    IRON AND TIBC      Component Value Range     Iron 49  42 - 135 ug/dL   TIBC 295  621 - 308 ug/dL   Saturation Ratios 14 (*) 20 - 55 %   UIBC 304  125 - 400 ug/dL  FERRITIN      Component Value Range   Ferritin 76  10 - 291 ng/mL  RETICULOCYTES      Component Value Range   Retic Ct Pct 2.0  0.4 - 3.1 %   RBC. 3.59 (*) 3.87 - 5.11 MIL/uL   Retic Count, Manual 71.8  19.0 - 186.0 K/uL  TROPONIN I      Component Value Range   Troponin I <0.30  <0.30 ng/mL  TROPONIN I      Component Value Range   Troponin I <0.30  <0.30 ng/mL  TROPONIN I      Component Value Range   Troponin I <0.30  <0.30 ng/mL  HEMOGLOBIN A1C      Component Value Range   Hemoglobin A1C 5.3  <5.7 %   Mean Plasma Glucose 105  <117 mg/dL  COMPREHENSIVE METABOLIC PANEL      Component Value Range   Sodium 139  135 - 145 mEq/L   Potassium 3.4 (*) 3.5 - 5.1 mEq/L   Chloride 100  96 -  112 mEq/L   CO2 27  19 - 32 mEq/L   Glucose, Bld 107 (*) 70 - 99 mg/dL   BUN 20  6 - 23 mg/dL   Creatinine, Ser 1.19 (*) 0.50 - 1.10 mg/dL   Calcium 9.5  8.4 - 14.7 mg/dL   Total Protein 8.6 (*) 6.0 - 8.3 g/dL   Albumin 3.3 (*) 3.5 - 5.2 g/dL   AST 14  0 - 37 U/L   ALT 6  0 - 35 U/L   Alkaline Phosphatase 106  39 - 117 U/L   Total Bilirubin 2.8 (*) 0.3 - 1.2 mg/dL   GFR calc non Af Amer 41 (*) >90 mL/min   GFR calc Af Amer 47 (*) >90 mL/min  CBC      Component Value Range   WBC 10.9 (*) 4.0 - 10.5 K/uL   RBC 3.59 (*) 3.87 - 5.11 MIL/uL   Hemoglobin 10.7 (*) 12.0 - 15.0 g/dL   HCT 82.9 (*) 56.2 - 13.0 %   MCV 90.5  78.0 - 100.0 fL   MCH 29.8  26.0 - 34.0 pg   MCHC 32.9  30.0 - 36.0 g/dL   RDW 86.5 (*) 78.4 - 69.6 %   Platelets 317  150 - 400 K/uL  PROTIME-INR      Component Value Range   Prothrombin Time 17.8 (*) 11.6 - 15.2 seconds   INR 1.51 (*) 0.00 - 1.49  APTT      Component Value Range   aPTT 39 (*) 24 - 37 seconds  URINE MICROSCOPIC-ADD ON      Component Value Range   Squamous Epithelial / LPF FEW (*) RARE   WBC, UA 0-2  <3 WBC/hpf   RBC / HPF 11-20   <3 RBC/hpf   Bacteria, UA FEW (*) RARE  PRO B NATRIURETIC PEPTIDE      Component Value Range   Pro B Natriuretic peptide (BNP) 10471.0 (*) 0 - 125 pg/mL  TSH      Component Value Range   TSH 3.610  0.350 - 4.500 uIU/mL  BASIC METABOLIC PANEL      Component Value Range   Sodium 138  135 - 145 mEq/L   Potassium 4.0  3.5 - 5.1 mEq/L   Chloride 102  96 - 112 mEq/L   CO2 24  19 - 32 mEq/L   Glucose, Bld 99  70 - 99 mg/dL   BUN 21  6 - 23 mg/dL   Creatinine, Ser 2.95 (*) 0.50 - 1.10 mg/dL   Calcium 9.3  8.4 - 28.4 mg/dL   GFR calc non Af Amer 40 (*) >90 mL/min   GFR calc Af Amer 46 (*) >90 mL/min  PROTEIN ELECTROPHORESIS, SERUM      Component Value Range   Total Protein ELP 8.0  6.0 - 8.3 g/dL   Albumin ELP 13.2 (*) 55.8 - 66.1 %   Alpha-1-Globulin 5.8 (*) 2.9 - 4.9 %   Alpha-2-Globulin 9.9  7.1 - 11.8 %   Beta Globulin 5.8  4.7 - 7.2 %   Beta 2 4.5  3.2 - 6.5 %   Gamma Globulin 29.7 (*) 11.1 - 18.8 %   M-Spike, % NOT DETECTED     SPE Interp. (NOTE)     Comment (NOTE)    IMMUNOFIXATION ELECTROPHORESIS      Component Value Range   Total Protein ELP 7.6  6.0 - 8.3 g/dL   IgG (Immunoglobin G), Serum 2420 (*) 690 - 1700 mg/dL  IgA 262  69 - 380 mg/dL   IgM, Serum 161  52 - 322 mg/dL   Immunofix Electr Int (NOTE)    URINALYSIS, ROUTINE W REFLEX MICROSCOPIC      Component Value Range   Color, Urine YELLOW  YELLOW   APPearance CLEAR  CLEAR   Specific Gravity, Urine 1.010  1.005 - 1.030   pH 5.5  5.0 - 8.0   Glucose, UA NEGATIVE  NEGATIVE mg/dL   Hgb urine dipstick NEGATIVE  NEGATIVE   Bilirubin Urine NEGATIVE  NEGATIVE   Ketones, ur NEGATIVE  NEGATIVE mg/dL   Protein, ur NEGATIVE  NEGATIVE mg/dL   Urobilinogen, UA 0.2  0.0 - 1.0 mg/dL   Nitrite NEGATIVE  NEGATIVE   Leukocytes, UA NEGATIVE  NEGATIVE  BASIC METABOLIC PANEL      Component Value Range   Sodium 138  135 - 145 mEq/L   Potassium 4.1  3.5 - 5.1 mEq/L   Chloride 101  96 - 112 mEq/L   CO2 28  19 - 32 mEq/L    Glucose, Bld 92  70 - 99 mg/dL   BUN 23  6 - 23 mg/dL   Creatinine, Ser 0.96 (*) 0.50 - 1.10 mg/dL   Calcium 9.1  8.4 - 04.5 mg/dL   GFR calc non Af Amer 34 (*) >90 mL/min   GFR calc Af Amer 40 (*) >90 mL/min  KAPPA/LAMBDA LIGHT CHAINS      Component Value Range   Kappa free light chain 15.40 (*) 0.33 - 1.94 mg/dL   Lamda free light chains 6.70 (*) 0.57 - 2.63 mg/dL   Kappa, lamda light chain ratio 2.30 (*) 0.26 - 1.65  BASIC METABOLIC PANEL      Component Value Range   Sodium 137  135 - 145 mEq/L   Potassium 4.0  3.5 - 5.1 mEq/L   Chloride 99  96 - 112 mEq/L   CO2 28  19 - 32 mEq/L   Glucose, Bld 89  70 - 99 mg/dL   BUN 27 (*) 6 - 23 mg/dL   Creatinine, Ser 4.09 (*) 0.50 - 1.10 mg/dL   Calcium 9.1  8.4 - 81.1 mg/dL   GFR calc non Af Amer 32 (*) >90 mL/min   GFR calc Af Amer 37 (*) >90 mL/min  SEDIMENTATION RATE      Component Value Range   Sed Rate 60 (*) 0 - 22 mm/hr  PRO B NATRIURETIC PEPTIDE      Component Value Range   Pro B Natriuretic peptide (BNP) 10792.0 (*) 0 - 125 pg/mL  BASIC METABOLIC PANEL      Component Value Range   Sodium 139  135 - 145 mEq/L   Potassium 3.7  3.5 - 5.1 mEq/L   Chloride 99  96 - 112 mEq/L   CO2 29  19 - 32 mEq/L   Glucose, Bld 99  70 - 99 mg/dL   BUN 33 (*) 6 - 23 mg/dL   Creatinine, Ser 9.14 (*) 0.50 - 1.10 mg/dL   Calcium 9.0  8.4 - 78.2 mg/dL   GFR calc non Af Amer 30 (*) >90 mL/min   GFR calc Af Amer 35 (*) >90 mL/min  IMMUNOFIXATION ELECTROPHORESIS, URINE (WITH TOT PROT)      Component Value Range   Time 24     Volume, Urine 1300     Total Protein, Urine 152.2     Total Protein, Urine-Ur/day 1979 (*) 10 - 140 mg/day   Albumin, U  DETECTED  DETECTED   Alpha 1, Urine DETECTED (*) NONE DETECTED   Alpha 2, Urine DETECTED (*) NONE DETECTED   Beta, Urine DETECTED (*) NONE DETECTED   Gamma Globulin, Urine DETECTED (*) NONE DETECTED   Free Kappa Lt Chains,Ur 132.00 (*) 0.14 - 2.42 mg/dL   Free Lt Chn Excr Rate 1716.00     Free Lambda  Lt Chains,Ur 10.90 (*) 0.02 - 0.67 mg/dL   Free Lambda Excretion/Day 141.70     Free Kappa/Lambda Ratio 12.11 (*) 2.04 - 10.37 ratio   Immunofixation, Urine (NOTE)    PROTEIN, URINE, 24 HOUR      Component Value Range   Urine Total Volume-UPROT 1300     Collection Interval-UPROT 24     Protein, Urine 68     Protein, 24H Urine 884 (*) 50 - 100 mg/day  CULTURE, BLOOD (ROUTINE X 2)      Component Value Range   Specimen Description BLOOD LEFT HAND     Special Requests BOTTLES DRAWN AEROBIC AND ANAEROBIC 7CC     Culture NO GROWTH 5 DAYS     Report Status 12/20/2011 FINAL    CULTURE, BLOOD (ROUTINE X 2)      Component Value Range   Specimen Description BLOOD LEFT ARM     Special Requests BOTTLES DRAWN AEROBIC ONLY 6CC     Culture NO GROWTH 5 DAYS     Report Status 12/20/2011 FINAL    VANCOMYCIN, TROUGH      Component Value Range   Vancomycin Tr 35.3 (*) 10.0 - 20.0 ug/mL  BASIC METABOLIC PANEL      Component Value Range   Sodium 133 (*) 135 - 145 mEq/L   Potassium 4.3  3.5 - 5.1 mEq/L   Chloride 94 (*) 96 - 112 mEq/L   CO2 27  19 - 32 mEq/L   Glucose, Bld 132 (*) 70 - 99 mg/dL   BUN 46 (*) 6 - 23 mg/dL   Creatinine, Ser 1.47 (*) 0.50 - 1.10 mg/dL   Calcium 8.5  8.4 - 82.9 mg/dL   GFR calc non Af Amer 31 (*) >90 mL/min   GFR calc Af Amer 36 (*) >90 mL/min  IMMUNOFIXATION ELECTROPHORESIS      Component Value Range   Total Protein ELP 7.3  6.0 - 8.3 g/dL   IgG (Immunoglobin G), Serum 2270 (*) 690 - 1700 mg/dL   IgA 562  69 - 130 mg/dL   IgM, Serum 865  52 - 322 mg/dL   Immunofix Electr Int (NOTE)    BASIC METABOLIC PANEL      Component Value Range   Sodium 133 (*) 135 - 145 mEq/L   Potassium 4.7  3.5 - 5.1 mEq/L   Chloride 95 (*) 96 - 112 mEq/L   CO2 26  19 - 32 mEq/L   Glucose, Bld 138 (*) 70 - 99 mg/dL   BUN 68 (*) 6 - 23 mg/dL   Creatinine, Ser 7.84 (*) 0.50 - 1.10 mg/dL   Calcium 8.9  8.4 - 69.6 mg/dL   GFR calc non Af Amer 24 (*) >90 mL/min   GFR calc Af Amer 28 (*)  >90 mL/min  BASIC METABOLIC PANEL      Component Value Range   Sodium 133 (*) 135 - 145 mEq/L   Potassium 4.7  3.5 - 5.1 mEq/L   Chloride 95 (*) 96 - 112 mEq/L   CO2 25  19 - 32 mEq/L   Glucose, Bld 110 (*) 70 -  99 mg/dL   BUN 69 (*) 6 - 23 mg/dL   Creatinine, Ser 4.09 (*) 0.50 - 1.10 mg/dL   Calcium 8.7  8.4 - 81.1 mg/dL   GFR calc non Af Amer 24 (*) >90 mL/min   GFR calc Af Amer 28 (*) >90 mL/min  BASIC METABOLIC PANEL      Component Value Range   Sodium 133 (*) 135 - 145 mEq/L   Potassium 5.1  3.5 - 5.1 mEq/L   Chloride 97  96 - 112 mEq/L   CO2 25  19 - 32 mEq/L   Glucose, Bld 95  70 - 99 mg/dL   BUN 76 (*) 6 - 23 mg/dL   Creatinine, Ser 9.14 (*) 0.50 - 1.10 mg/dL   Calcium 9.1  8.4 - 78.2 mg/dL   GFR calc non Af Amer 26 (*) >90 mL/min   GFR calc Af Amer 30 (*) >90 mL/min  MRSA PCR SCREENING      Component Value Range   MRSA by PCR NEGATIVE  NEGATIVE  CBC      Component Value Range   WBC 28.6 (*) 4.0 - 10.5 K/uL   RBC 2.86 (*) 3.87 - 5.11 MIL/uL   Hemoglobin 8.3 (*) 12.0 - 15.0 g/dL   HCT 95.6 (*) 21.3 - 08.6 %   MCV 84.3  78.0 - 100.0 fL   MCH 29.0  26.0 - 34.0 pg   MCHC 34.4  30.0 - 36.0 g/dL   RDW 57.8 (*) 46.9 - 62.9 %   Platelets 301  150 - 400 K/uL  BASIC METABOLIC PANEL      Component Value Range   Sodium 136  135 - 145 mEq/L   Potassium 4.6  3.5 - 5.1 mEq/L   Chloride 97  96 - 112 mEq/L   CO2 27  19 - 32 mEq/L   Glucose, Bld 69 (*) 70 - 99 mg/dL   BUN 81 (*) 6 - 23 mg/dL   Creatinine, Ser 5.28 (*) 0.50 - 1.10 mg/dL   Calcium 9.1  8.4 - 41.3 mg/dL   GFR calc non Af Amer 26 (*) >90 mL/min   GFR calc Af Amer 30 (*) >90 mL/min  BASIC METABOLIC PANEL      Component Value Range   Sodium 132 (*) 135 - 145 mEq/L   Potassium 4.9  3.5 - 5.1 mEq/L   Chloride 94 (*) 96 - 112 mEq/L   CO2 28  19 - 32 mEq/L   Glucose, Bld 99  70 - 99 mg/dL   BUN 81 (*) 6 - 23 mg/dL   Creatinine, Ser 2.44 (*) 0.50 - 1.10 mg/dL   Calcium 9.0  8.4 - 01.0 mg/dL   GFR calc non Af  Amer 26 (*) >90 mL/min   GFR calc Af Amer 31 (*) >90 mL/min  CBC      Component Value Range   WBC 25.6 (*) 4.0 - 10.5 K/uL   RBC 3.09 (*) 3.87 - 5.11 MIL/uL   Hemoglobin 8.9 (*) 12.0 - 15.0 g/dL   HCT 27.2 (*) 53.6 - 64.4 %   MCV 84.5  78.0 - 100.0 fL   MCH 28.8  26.0 - 34.0 pg   MCHC 34.1  30.0 - 36.0 g/dL   RDW 03.4 (*) 74.2 - 59.5 %   Platelets 328  150 - 400 K/uL  COMPREHENSIVE METABOLIC PANEL      Component Value Range   Sodium 135  135 - 145 mEq/L   Potassium 4.7  3.5 - 5.1 mEq/L   Chloride 96  96 - 112 mEq/L   CO2 29  19 - 32 mEq/L   Glucose, Bld 89  70 - 99 mg/dL   BUN 81 (*) 6 - 23 mg/dL   Creatinine, Ser 4.09 (*) 0.50 - 1.10 mg/dL   Calcium 9.3  8.4 - 81.1 mg/dL   Total Protein 7.7  6.0 - 8.3 g/dL   Albumin 2.4 (*) 3.5 - 5.2 g/dL   AST 24  0 - 37 U/L   ALT 22  0 - 35 U/L   Alkaline Phosphatase 120 (*) 39 - 117 U/L   Total Bilirubin 3.3 (*) 0.3 - 1.2 mg/dL   GFR calc non Af Amer 27 (*) >90 mL/min   GFR calc Af Amer 31 (*) >90 mL/min  TESTOSTERONE      Component Value Range   Testosterone <10.00 (*) 10 - 70 ng/dL  BASIC METABOLIC PANEL      Component Value Range   Sodium 136  135 - 145 mEq/L   Potassium 4.5  3.5 - 5.1 mEq/L   Chloride 92 (*) 96 - 112 mEq/L   CO2 32  19 - 32 mEq/L   Glucose, Bld 83  70 - 99 mg/dL   BUN 81 (*) 6 - 23 mg/dL   Creatinine, Ser 9.14 (*) 0.50 - 1.10 mg/dL   Calcium 9.4  8.4 - 78.2 mg/dL   GFR calc non Af Amer 26 (*) >90 mL/min   GFR calc Af Amer 30 (*) >90 mL/min  CARBOXYHEMOGLOBIN      Component Value Range   Total hemoglobin 7.7 (*) 12.0 - 16.0 g/dL   O2 Saturation 95.6     Carboxyhemoglobin 2.3 (*) 0.5 - 1.5 %   Methemoglobin 1.1  0.0 - 1.5 %  COMPREHENSIVE METABOLIC PANEL      Component Value Range   Sodium 135  135 - 145 mEq/L   Potassium 3.9  3.5 - 5.1 mEq/L   Chloride 91 (*) 96 - 112 mEq/L   CO2 33 (*) 19 - 32 mEq/L   Glucose, Bld 118 (*) 70 - 99 mg/dL   BUN 83 (*) 6 - 23 mg/dL   Creatinine, Ser 2.13 (*) 0.50 - 1.10  mg/dL   Calcium 9.1  8.4 - 08.6 mg/dL   Total Protein 7.6  6.0 - 8.3 g/dL   Albumin 2.4 (*) 3.5 - 5.2 g/dL   AST 20  0 - 37 U/L   ALT 18  0 - 35 U/L   Alkaline Phosphatase 134 (*) 39 - 117 U/L   Total Bilirubin 3.1 (*) 0.3 - 1.2 mg/dL   GFR calc non Af Amer 25 (*) >90 mL/min   GFR calc Af Amer 29 (*) >90 mL/min  CARBOXYHEMOGLOBIN      Component Value Range   Total hemoglobin 12.6  12.0 - 16.0 g/dL   O2 Saturation 57.8     Carboxyhemoglobin 2.0 (*) 0.5 - 1.5 %   Methemoglobin 1.0  0.0 - 1.5 %  CBC      Component Value Range   WBC 30.8 (*) 4.0 - 10.5 K/uL   RBC 2.63 (*) 3.87 - 5.11 MIL/uL   Hemoglobin 7.3 (*) 12.0 - 15.0 g/dL   HCT 46.9 (*) 62.9 - 52.8 %   MCV 84.0  78.0 - 100.0 fL   MCH 27.8  26.0 - 34.0 pg   MCHC 33.0  30.0 - 36.0 g/dL   RDW 41.3 (*) 24.4 - 01.0 %  Platelets 301  150 - 400 K/uL  CREATININE, SERUM      Component Value Range   Creatinine, Ser 2.37 (*) 0.50 - 1.10 mg/dL   GFR calc non Af Amer 25 (*) >90 mL/min   GFR calc Af Amer 29 (*) >90 mL/min  CBC      Component Value Range   WBC 29.1 (*) 4.0 - 10.5 K/uL   RBC 2.65 (*) 3.87 - 5.11 MIL/uL   Hemoglobin 7.3 (*) 12.0 - 15.0 g/dL   HCT 16.1 (*) 09.6 - 04.5 %   MCV 83.8  78.0 - 100.0 fL   MCH 27.5  26.0 - 34.0 pg   MCHC 32.9  30.0 - 36.0 g/dL   RDW 40.9 (*) 81.1 - 91.4 %   Platelets 281  150 - 400 K/uL  BASIC METABOLIC PANEL      Component Value Range   Sodium 136  135 - 145 mEq/L   Potassium 4.0  3.5 - 5.1 mEq/L   Chloride 93 (*) 96 - 112 mEq/L   CO2 34 (*) 19 - 32 mEq/L   Glucose, Bld 102 (*) 70 - 99 mg/dL   BUN 79 (*) 6 - 23 mg/dL   Creatinine, Ser 7.82 (*) 0.50 - 1.10 mg/dL   Calcium 8.9  8.4 - 95.6 mg/dL   GFR calc non Af Amer 25 (*) >90 mL/min   GFR calc Af Amer 29 (*) >90 mL/min  POCT I-STAT 3, BLOOD GAS (G3P V)      Component Value Range   pH, Ven 7.471 (*) 7.250 - 7.300   pCO2, Ven 46.4  45.0 - 50.0 mmHg   pO2, Ven 32.0  30.0 - 45.0 mmHg   Bicarbonate 33.9 (*) 20.0 - 24.0 mEq/L   TCO2  35  0 - 100 mmol/L   O2 Saturation 65.0     Acid-Base Excess 9.0 (*) 0.0 - 2.0 mmol/L   Sample type VENOUS     Comment NOTIFIED PHYSICIAN    POCT I-STAT 3, BLOOD GAS (G3P V)      Component Value Range   pH, Ven 7.462 (*) 7.250 - 7.300   pCO2, Ven 45.1  45.0 - 50.0 mmHg   pO2, Ven 36.0  30.0 - 45.0 mmHg   Bicarbonate 32.2 (*) 20.0 - 24.0 mEq/L   TCO2 34  0 - 100 mmol/L   O2 Saturation 72.0     Acid-Base Excess 8.0 (*) 0.0 - 2.0 mmol/L   Sample type VENOUS     Comment NOTIFIED PHYSICIAN    POCT I-STAT 3, BLOOD GAS (G3P V)      Component Value Range   pH, Ven 7.484 (*) 7.250 - 7.300   pCO2, Ven 47.0  45.0 - 50.0 mmHg   pO2, Ven 36.0  30.0 - 45.0 mmHg   Bicarbonate 35.3 (*) 20.0 - 24.0 mEq/L   TCO2 37  0 - 100 mmol/L   O2 Saturation 73.0     Acid-Base Excess 11.0 (*) 0.0 - 2.0 mmol/L   Sample type VENOUS    POCT I-STAT 3, BLOOD GAS (G3P V)      Component Value Range   pH, Ven 7.459 (*) 7.250 - 7.300   pCO2, Ven 48.1  45.0 - 50.0 mmHg   pO2, Ven 30.0  30.0 - 45.0 mmHg   Bicarbonate 34.1 (*) 20.0 - 24.0 mEq/L   TCO2 36  0 - 100 mmol/L   O2 Saturation 60.0     Acid-Base Excess 9.0 (*) 0.0 -  2.0 mmol/L   Sample type VENOUS     Comment NOTIFIED PHYSICIAN    POCT I-STAT 3, BLOOD GAS (G3P V)      Component Value Range   pH, Ven 7.484 (*) 7.250 - 7.300   pCO2, Ven 47.5  45.0 - 50.0 mmHg   pO2, Ven 32.0  30.0 - 45.0 mmHg   Bicarbonate 35.7 (*) 20.0 - 24.0 mEq/L   TCO2 37  0 - 100 mmol/L   O2 Saturation 66.0     Acid-Base Excess 11.0 (*) 0.0 - 2.0 mmol/L   Sample type VENOUS     Comment NOTIFIED PHYSICIAN    DIFFERENTIAL      Component Value Range   Neutro Abs 23.8 (*) 1.7 - 7.7 K/uL   Lymphs Abs 2.0  0.7 - 4.0 K/uL   Monocytes Absolute 1.3 (*) 0.1 - 1.0 K/uL   Eosinophils Absolute 0.2  0.0 - 0.7 K/uL   Basophils Absolute 0.0  0.0 - 0.1 K/uL   Neutrophils Relative 87 (*) 43 - 77 %   Lymphocytes Relative 7 (*) 12 - 46 %   Monocytes Relative 5  3 - 12 %   Eosinophils  Relative 1  0 - 5 %   Basophils Relative 0  0 - 1 %   RBC Morphology POLYCHROMASIA PRESENT     WBC Morphology MILD LEFT SHIFT (1-5% METAS, OCC MYELO, OCC BANDS)    TYPE AND SCREEN      Component Value Range   ABO/RH(D) O POS     Antibody Screen NEG     Sample Expiration 12/30/2011     Unit Number [id number]     Blood Component Type RBC LR PHER1     Unit division 00     Status of Unit ISSUED     Transfusion Status OK TO TRANSFUSE     Crossmatch Result Compatible     Unit Number [id number]     Blood Component Type RBC LR PHER2     Unit division 00     Status of Unit ALLOCATED     Transfusion Status OK TO TRANSFUSE     Crossmatch Result Compatible    PREPARE RBC (CROSSMATCH)      Component Value Range   Order Confirmation ORDER PROCESSED BY BLOOD BANK    ABO/RH      Component Value Range   ABO/RH(D) O POS     A/P: Pt with hx of anemia, leukocytosis, hypergammaglobulinemia. Plan is for CT guided bone marrow biopsy on 11/1. Details/risks of procedure d/w pt with her understanding and consent.

## 2011-12-27 NOTE — Progress Notes (Signed)
PT Cancellation Note  Patient Details Name: [name] MRN: [id number] DOB: 1970/08/09   Cancelled Treatment:     PT session cancelled today due to pt's Hgb 7.3 & pt reporting significant R LE pain.  Pt awaiting blood transfusion.       Verdell Face, [place] [phone] 12/27/2011

## 2011-12-27 NOTE — Progress Notes (Signed)
OT Cancellation Note  Patient Details Name: [name] MRN: [id number] DOB: 01-06-1971   Cancelled Treatment:   Pt. With Hbg 7.3.  Will  Re-attempt    Jeani Hawking M [phone] 12/27/2011, 2:23 PM

## 2011-12-27 NOTE — Progress Notes (Signed)
Advanced Heart Failure Rounding Note   Subjective:    41 yo with history of HTN, gout, morbid obesity, atrial fibrillation and diastolic heart failure.  Admitted to [facility] on 10/14 for massive volume overload.  Transferred to [facility] on 10/26 due to worsening renal function, weakness and volume overload.   Echo: diastolic heart failure with ejection fraction of 55-60%, severe LVH, Mildly dilated RV.  Mildly to mod dilated RA.  moderate pericardial effusion and echocardiographic changes consistent with a possible infiltrative disease.  PAPP 72 mmHg  Fat pad bx negative for amyloid.  Bone marrow aspiration and biopsy was planned for outpatient.  Cr 1.57>>>>2.43>>2.22>2.28>2.35>2.36  Milrinone stopped 10/30 after RHC RA = 16  RV = 81/14/20  PA = 78/32 (52)  PCW = 21  Fick cardiac output/index = 11.9/4.6  Thermo CO/CI = 9.8/3.8  PVR = 2.6 Woods (Fick)  FA sat = 91%  PA sat = 65%, 72%  SVC sat = 73%  RA sat = 66%  Continues on lasix drip at 5. Net I/Os: - 5 liters yesterday.  Weight unable to compare today due to using standing scale vs baribed.     Hgb 7.3 Still with leg pain.  No SOB/orthopnea/PND.  CVP 15  PCW 20 PA 72/33  Objective:    Vital Signs:   Temp:  [97.7 F (36.5 C)-98.4 F (36.9 C)] 98.1 F (36.7 C) (10/31 0833) Pulse Rate:  [58-118] 106  (10/31 0911) Resp:  [12-21] 13  (10/31 0833) BP: (123-165)/(64-99) 165/99 mmHg (10/31 0911) SpO2:  [91 %-97 %] 97 % (10/31 0833) Weight:  [149 kg (328 lb 7.8 oz)-162.7 kg (358 lb 11 oz)] 162.7 kg (358 lb 11 oz) (10/31 0500) Last BM Date: 12/25/11  Weight change: Filed Weights   12/25/11 0700 12/26/11 1657 12/27/11 0500  Weight: 183.6 kg (404 lb 12.2 oz) 149 kg (328 lb 7.8 oz) 162.7 kg (358 lb 11 oz)    Intake/Output:   Intake/Output Summary (Last 24 hours) at 12/27/11 1021 Last data filed at 12/27/11 0600  Gross per 24 hour  Intake 1214.62 ml  Output   6775 ml  Net -5560.38 ml     Physical Exam: General:  Obese,  well appearing. No resp difficulty. Strikingly female features HEENT: normal Neck: supple. JVP hard to see but appears to jaw. Carotids 2+ bilat; no bruits. No lymphadenopathy or thryomegaly appreciated. Cor: PMI nondisplaced. + probable RV lift Regular rate & rhythm. No rubs, gallops or murmurs. Lungs: decreased throughout Abdomen: obese, soft, nontender, + distention. No hepatosplenomegaly. No bruits or masses. Good bowel sounds. Extremities: no cyanosis, clubbing, rash, 1+ pitting edema to thigh.  LLE wrapped. Cellulitis/lymphedema on L. RUE PICC Neuro: alert & orientedx3, cranial nerves grossly intact. moves all 4 extremities w/o difficulty. Affect pleasant  Telemetry: AF 70-90s  Labs: Basic Metabolic Panel:  Lab 12/27/11 1610 12/26/11 1730 12/26/11 0450 12/25/11 0500 12/24/11 0600 12/23/11 2344  NA 136 -- 135 136 135 132*  K 4.0 -- 3.9 4.5 4.7 4.9  CL 93* -- 91* 92* 96 94*  CO2 34* -- 33* 32 29 28  GLUCOSE 102* -- 118* 83 89 99  BUN 79* -- 83* 81* 81* 81*  CREATININE 2.36* 2.37* 2.35* 2.28* 2.22* --  CALCIUM 8.9 -- 9.1 9.4 -- --  MG -- -- -- -- -- --  PHOS -- -- -- -- -- --    Liver Function Tests:  Lab 12/26/11 0450 12/24/11 0600  AST 20 24  ALT 18 22  ALKPHOS 134* 120*  BILITOT 3.1* 3.3*  PROT 7.6 7.7  ALBUMIN 2.4* 2.4*   No results found for this basename: LIPASE:5,AMYLASE:5 in the last 168 hours No results found for this basename: AMMONIA:3 in the last 168 hours  CBC:  Lab 12/27/11 0450 12/26/11 1730 12/24/11 0600 12/23/11 0833  WBC 29.1* 30.8* 25.6* 28.6*  NEUTROABS -- -- -- --  HGB 7.3* 7.3* 8.9* 8.3*  HCT 22.2* 22.1* 26.1* 24.1*  MCV 83.8 84.0 84.5 84.3  PLT 281 301 328 301    Cardiac Enzymes: No results found for this basename: CKTOTAL:5,CKMB:5,CKMBINDEX:5,TROPONINI:5 in the last 168 hours  BNP: BNP (last 3 results)  Basename 12/14/11 2102 12/11/11 1330  PROBNP 10792.0* 10471.0*       Medications:     Scheduled Medications:    .  amLODipine  10 mg Oral Daily  . aspirin  81 mg Oral Daily  . colchicine  0.6 mg Oral Daily  . folic acid  1 mg Oral Daily  . heparin  5,000 Units Subcutaneous Q8H  . labetalol  400 mg Oral BID  . pantoprazole  40 mg Oral Daily  . sodium chloride  10-40 mL Intracatheter Q12H  . sodium chloride  3 mL Intravenous Q12H  . sodium chloride  3 mL Intravenous Q12H  . DISCONTD: heparin subcutaneous  5,000 Units Subcutaneous Q8H  . DISCONTD: levofloxacin (LEVAQUIN) IV  750 mg Intravenous Q24H  . DISCONTD: predniSONE  20 mg Oral Q breakfast    Infusions:    . sodium chloride 10 mL/hr at 12/25/11 2000  . sodium chloride 7 mL/hr (12/25/11 2000)  . furosemide (LASIX) infusion 5 mg/hr (12/27/11 0622)  . DISCONTD: milrinone 0.25 mcg/kg/min (12/26/11 0843)    PRN Medications: acetaminophen, hydrALAZINE, morphine injection, ondansetron (ZOFRAN) IV, oxyCODONE-acetaminophen, promethazine, sodium chloride, sodium chloride, DISCONTD: acetaminophen   Assessment:   1. Acute on chronic diastolic HF 2. 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 Atrial fibrillation 4. Morbid obesity, BMI 54.8  5. HTN, uncontrolled 6. Acute on chronic renal failure (baseline Cr 1.4-1.5) 7. Cellulitis 8. Anemia  Plan/Discussion:    The main issue here seems to be high output HF with high right sided pressures. She is doing well with diuresis though renal function remains elevated. Continue IV diuresis as long as renal function tolerates.  She is markedly anemic and would transfuse 2 units. Also would do limited echo with bubble study to look for R to L shunting. TSH normal.   Consider heme f/u (were seeing at [facility]) - ? Need for BMBx.   Length of Stay: 17 Daniel Bensimhon,MD 10:25 AM

## 2011-12-27 NOTE — Consult Note (Signed)
ONCOLOGY  HOSPITAL CONSULTATION NOTE  [name]                                MR#: [id number]  DOB: 1970-09-23                       CSN#: [id number]  Referring MD: [name]   Primary MD: Dr.  Jaquita [name] for Consult: Hypergammaglobulinemia   [name] is a 41 y.o. female with multiple cardiac issues listed below, initially seen in consultation by Hematology at [name] by [name] for evaluation of amyloidosis with negative biopsy reports, asked to see in consultation while being admitted to [name] for continuation of hematological workup. In review, she was admitted on 12/12/2011 due to fluid overload and pericardial effusion. Echo at the time, had revealed very thick myocardium which needed further investigation.Laboratory work showed  renal insufficiency, elevated BNP of over 10,000, hyperproteinemia, elevated total bilirubin, and hypoalbuminemia. She was found to have normocytic anemia.   SerumQuantitative immunoglobulins showed IgG elevated at 2420, with IgA of 262,  IgM 2420.  Repeat IgG and IgA on 12/17/2011 shows IgG of 227  with IgA of 272 and IgM123.  Free kappa light chain was elevated at 15.4 , lambda free light chains at 6.7 and kappa/lambda light chain ratio of 2.3 . [name] was not detected. Gamma globulins on 10/16 were elevated at  29.7   UPEP on 12/16/2011 showed elevated protein in urine at 1979, free kappa light chains at 132, free lambda light chains of 10.9 and a free kappa lambda ratio 12.11. Immunofixation showed No monoclonal free light chains (Bence Jones Protein)  Detected. Polyclonal increase in free Kappa and/or free Lambda light chains was noted again. ESR as of 12/14/2011 was 60. She underwent Skin and soft tissue biopsy of abdominal wall by [name] on 12/18/2011. This Fat pad biopsy is negative for amyloidosis ([name], case number [id number]). Bone marrow aspiration and biopsy via Interventional  Radiology was to be arranged, but patient was transferred to [name] for continuation of care, therefore, it has not been performed to date.   We were kindly requested to evaluate this patient from the hematological standpoint.        PMH:  Past Medical History  Diagnosis Date  . Essential hypertension, benign     History of accelerated hypertension with urgency  . Atrial flutter     Documented 2006 - [facility]  . Gout   . History of cardiac catheterization     Ectatic coronaries without obstruction 2006 - [facility]  . Morbid obesity   . Hypertensive heart disease     LVEF reportedly 50-55% in 2006 - [facility]  . CKD (chronic kidney disease) stage 3, GFR 30-59 ml/min     Surgeries:  Past Surgical History  Procedure Date  . Skin biopsy 12/18/2011    Procedure: BIOPSY SKIN;  Surgeon: Beverly Bering, MD;  Location: [facility];  Service: General;  Laterality: N/A;  in the minor room.    Allergies:  Allergies  Allergen Reactions  . Penicillins Hives    Childhood allergy    Medications:   Prior to Admission:  Prescriptions prior to admission  Medication Sig Dispense Refill  . aspirin 81 MG tablet Take 81 mg by mouth daily.        Marland Kitchen DISCONTD: ibuprofen (ADVIL,MOTRIN) 200 MG tablet Take  400 mg by mouth daily as needed. For pain      . DISCONTD: labetalol (NORMODYNE) 200 MG tablet Take 200 mg by mouth 2 (two) times daily.          HQI:ONGEXBMWUXLKG, hydrALAZINE, morphine injection, ondansetron (ZOFRAN) IV, oxyCODONE-acetaminophen, promethazine, sodium chloride, sodium chloride, DISCONTD: acetaminophen  ROS: Constitutional: Positive for weight loss. Negative for fever, chills or  night sweats.  Eyes: Negative for blurred vision and double vision.  Respiratory: Negative for productive cough. No hemoptysis.No hoarseness.No neck swelling.  Positive for shortness of breath on exertion. No pleuritic chest pain.  Cardiovascular: Negative for chest pain. No palpitations.  GI: Negative for  nausea,  vomiting, diarrhea or constipation. No change in bowel caliber. No  Melena or hematochezia. negative for  abdominal pain.  GU: Negative for hematuria. No loss of urinary control. No urinary retention.No changes in urine color.  Skin: Negative for itching. No rash. No petechia. No easy  Bruising. Musculoskeletal: Denies bone pain. Left foot gout flare while hospitalized, as well as LLE cellulitis with tenderness in the area. Neurological: No headaches.No confusion. No motor or sensory deficits.No headaches. Psych: No depression. No suicidal thoughts.  Family History:    Family History  Problem Relation Age of Onset  . Hypertension        Social History:  reports that she has never smoked. She does not have any smokeless tobacco history on file. She reports that she does not drink alcohol or use illicit drugs. single, no children. Lives in [place] with her sister.  Physical Exam    Filed Vitals:   12/27/11 0911  BP: 165/99  Pulse: 106  Temp:   Resp:      Filed Weights   12/25/11 0700 12/26/11 1657 12/27/11 0500  Weight: 404 lb 12.2 oz (183.6 kg) 328 lb 7.8 oz (149 kg) 358 lb 11 oz (162.7 kg)   General:  41 -year-old  in no acute distress A. and O. x3  well-developed,morbidly obese. Her voice is significantly deep. HEENT: Normocephalic, atraumatic, PERRLA. Sclerae anicteric. Oral cavity without thrush or lesions. NECK: no cervical or supraclavicular adenopathy, but exam is limited due to bandage is appetite to the right side of her neck were central line is inserted. Presence of keloid is noted in the neck area. LUNGS: clear bilaterally . No wheezing, rhonchi or rales. No axillary masses. BREASTS: not examined. CARDIOVASCULAR: irregularly irregular rate and rhythm normal S1-S2, no murmur , rubs or gallops ABDOMEN: morbidly obese,bowel sounds x4. No HSM. No masses palpable, but limited exam due to body habitus..  GU/rectal: deferred. EXTREMITIES: left leg cellulitis and stage 2  wounds to buttocks cared by [facility]. Generalized edema in all extremities.  No bruising or petechial rash NEURO: Non Focal. No Horner's.   Labs:  CBC   Lab 12/27/11 1000 12/27/11 0450 12/26/11 1730 12/24/11 0600 12/23/11 0833  WBC -- 29.1* 30.8* 25.6* 28.6*  HGB -- 7.3* 7.3* 8.9* 8.3*  HCT -- 22.2* 22.1* 26.1* 24.1*  PLT -- 281 301 328 301  MCV -- 83.8 84.0 84.5 84.3  MCH -- 27.5 27.8 28.8 29.0  MCHC -- 32.9 33.0 34.1 34.4  RDW -- 16.7* 16.7* 16.7* 16.7*  LYMPHSABS PENDING -- -- -- --  MONOABS PENDING -- -- -- --  EOSABS PENDING -- -- -- --  BASOSABS PENDING -- -- -- --  BANDABS -- -- -- -- --     CMP    Lab 12/27/11 0450 12/26/11 1730 12/26/11 0450 12/25/11 0500 12/24/11  0600 12/23/11 2344  NA 136 -- 135 136 135 132*  K 4.0 -- 3.9 4.5 4.7 4.9  CL 93* -- 91* 92* 96 94*  CO2 34* -- 33* 32 29 28  GLUCOSE 102* -- 118* 83 89 99  BUN 79* -- 83* 81* 81* 81*  CREATININE 2.36* 2.37* 2.35* 2.28* 2.22* --  CALCIUM 8.9 -- 9.1 9.4 9.3 9.0  MG -- -- -- -- -- --  AST -- -- 20 -- 24 --  ALT -- -- 18 -- 22 --  ALKPHOS -- -- 134* -- 120* --  BILITOT -- -- 3.1* -- 3.3* --      Imaging Studies:  Dg Chest Port 1 View  12/24/2011  *RADIOLOGY REPORT*  Clinical Data: Status post PICC line placement  PORTABLE CHEST - 1 VIEW  Comparison: 12/10/2011  Findings: The heart is stable in appearance.  The lungs are clear. A new right-sided PICC line is noted with catheter tip in the mid superior vena cava just below the carina.  No other focal abnormality is noted.  IMPRESSION: PICC line on the right with the tip as described.   Original Report Authenticated By: [name], BeverlyD.     US Abdomen Complete  12/14/2011 Comparison: Renal ultrasound, 2006 Findings: Gallbladder: The gallbladder is distended and shows no intraluminal stones or sludge. The gallbladder wall appears thickened with a width of 6.1 mm and has a tri- layered appearance suggesting the presence of wall edema. Evaluation for a  sonographic Murphy's sign is negative. Common bile duct: Measures 4.3 mm in diameter and has a normal appearance Liver: Is enlarged with a sagittal length of 24.1 cm in the midclavicular line no focal parenchymal abnormality is seen and overall echotexture is slightly diminished relative to the kidney. No signs of intrahepatic ductal dilatation are noted IVC: The proximal portion appears normal Pancreas: Is poorly visualized due to shadowing from overlying gas Spleen: Has a length of 14.4 cm and overall volume of 802 cc compatible with splenomegaly sonographically. The spleen demonstrates a diffuse decrease in echotexture and this finding has been described in amyloidosis. No focal abnormality is seen Right Kidney: Demonstrates a sagittal length of 13.1 cm. A mild diffuse increase in cortical echotexture is seen with no parenchymal loss. This may be related to amyloidosis deposition or associated with the underlying chronic renal disease. No focal abnormalities or signs of hydronephrosis are noted Left Kidney: Has a sagittal length of 11.6 cm. The overall echotexture is mildly increased with no focal parenchymal abnormality or hydronephrosis noted Abdominal aorta: The distal portion of the aorta is obscured by overlying gas. The visualized portion has a maximal caliber of 2.8 cm with no aneurysmal dilatation seen Other: A small amount of ascites is identified in the upper abdomen. IMPRESSION: Hepatomegaly and splenomegaly with mild decrease in echotexture suspected diffusely for both organs. 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 Slight increase in renal echotexture bilaterally. This has been described with amyloidosis deposition but may also be secondary to the known chronic renal disease. No focal renal abnormalities are seen. Small amount of ascites. Thickening of the gallbladder wall with a tri-layered pattern suggesting wall edema. Given the  presence of ascites and clinical history of anasarca, this is likely due to third spacing. In the appropriate clinical setting, this can be an indicator of acute acalculous cholecystitis. Original Report Authenticated By: [name], M.    A/P: 41 y.o. female initially seen  in consultation by Hematology at [facility] by [name] for evaluation of amyloidosis with negative biopsy reports, asked to see in consultation while being admitted to [facility] for continuation of hematological workup. Bone marrow biopsy may be indicated, performed by IR due to the patient's body habitus.  [name]  is to see the patient following this consult with recommendations regarding further workup studies. Addendum to this note to be written. Thank you for the referral.  [name] 12/27/2011 12:06 PM    We were asked to see this patient in consultation to assist with investigations regarding the possibility of amyloidosis. I have discussed her case this evening with [name]. It is important to note that both urine and serum protein studies did not identify a monoclonal protein. A fat pad biopsy carried out on 12/18/2011 was negative for amyloid. We have requested that interventional radiology carry out a bone marrow aspirate and biopsy with flow studies, cytogenetics and FISH. If the bone marrow also fails to show amyloid (50-60% positivity), other avenues to pursue would include renal consultation, cardiac MRI, biopsies of kidney, rectum and endomyocardium.  If there was amyloid involvement of the heart, I would expect poor cardiac output and low blood pressures rather than the high output which was noted on the right heart cath carried out yesterday. Despite her severe right-sided congestive heart failure, the patient still has some degree of systemic hypertension. Her renal insufficiency could be due to her hypertensive disease and the hepatosplenomegaly could be due  to the severe right-sided heart failure.  The patient's hemoglobin today was 7.3 as compared with 11.3 on 12/10/2011 at the time of admission to [name]. I suspect that iatrogenic phlebotomy has a lot to do with this pronounced drop in the hemoglobin and hematocrit. There's been no obvious GI bleeding. Stools for occult blood have been ordered but have not yet been submitted for testing. The patient does give a history of intermittently heavy periods. Her peripheral blood smear shows hypochromic red cells which suggests iron deficiency. Two units of packed red cells have been ordered for the patient. We will order intravenous iron for her as well.   [name] 12/27/2011 10:28 PM

## 2011-12-27 NOTE — Consult Note (Signed)
Wound care follow-up:  Yellow gelatinous covering completely removed from left outer leg wound.  Wound bed 100% red and moist, no odor.  5.5X5X.1cm full thickness wound.  Surrounding skin with previous blisters which have evolved into red, partial thickness skin loss.  Large amt yellow drainage, generalized edema. Plan: D/C wet to dry dressings.  Foam dressings to leg wounds to absorb drainage and promote healing. Will not plan to follow further unless re-consulted.  [address], RN, MSN, [employer]  [phone]

## 2011-12-27 NOTE — Progress Notes (Signed)
[facility] Progress Note [place] TEAM 1 - Stepdown/ICU TEAM   VINETTE ROCKOFF ZOX:[id number] DOB: [birth year] DOA: 12/10/2011 PCP: [name]., MD  Brief narrative: The patient is a morbidly obese 41 yo woman with a h/o HTN, poorly controlled, who was admitted with volume overload and because of being refractory to diuresis is admitted in transfer from [facility] for ongoing therapy. She has a h/o non-compliance. She has massive peripheral edema and has been obese for many years. She also has a h/o gout with flairs in the past.  Assessment/Plan:  Acute right heart failure/Anasarca/known diastolic dysfunction with chronic heart failure- high output heart failure with high right-sided pressures *Appreciate heart failure team assistance *Status post right heart catheterization on 12/26/2011 that revealed high cardiac output greater than 10 with low SVR. Now has PA catheter in place. Off milrinone her PA pressures remain high with a wedge of 20 cardiac output of 9.5, cardiac index 3.6, PVR 127 and SVR 245 *Lasix infusion has been resumed at 5 mg per hour by the heart failure team-continue unless renal function progressively worse *Unclear to etiology of patient's high-output heart failure-parenchyma duration of the heart failure team we have ordered a limited echo with bubble study to see if patient has a right to left shunt *Now 38 liters negative  ? 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 *EF is preserved *She had elevated kappa and lambda on SPEP and UPEP but a fat pad biopsy was negative for amyloid but other findings including hepatosplenomegaly are concerning for a gamma globulin abnormality/function therefore we have re consulted hematology *Apparently a bone marrow biopsy was scheduled at [facility] but was canceled upon transfer to [facility]  Pulmonary HTN-severe *Continuing Norvasc  *See above regarding high-output heart  failure  Anemia *Could be indicative of dilutional status *Baseline hemoglobin on 12/10/2011 was 11.3 and currently hemoglobin has drifted down to 7.3 *Cardiology recommending transfusion which would help support oxygen demand in her setting of high-output cardiac heart failure. *We'll defer transfusion until patient evaluated by hematology in the event that transfused blood products could confuse the hematological picture *Ferritin on 12/11/2011 was 76, folate 3.6, iron 49, TIBC 353, saturation 14%, annual IVC 304 *TSH only mildly elevated at 4.8  Accelerated hypertension *Blood pressure now well controlled controlled  *No ACE inhibitor or ARB secondary to ongoing renal failure  Atrial fibrillation with controlled ventricular response *Has significant left atrial enlargement of unknown duration *After adequately diuresed consideration can be given to TEE/cardioversion- her extent of compliance with anticoagulation will need to be discussed with her prior to making a decision.   Cellulitis of left leg/Fever/ Leukocytosis *Doxy switched to Levaquin 10/28-Levaquin discontinued 12/26/2011 *Given low SVR and high cardiac output and persistent and increasing leukocytosis need to rule out continued orin adequately treated factious causes so we'll check Procalcitonin level today-it is noted that the degree of cellulitis the patient has experienced since admission is not consistent with a pattern of severe sepsis which will be the picture given her Swan readings regarding cardiac output and SVR  *Also concerning that the leukocytosis is not an indication of an infectious cause but may be due to underlying hematological disorder so we have asked her peripheral smear and differential today-as noted above hematology consultation is pending *wound ostomy care nurse has evaluated and made recommendations *dopplers were performed but were inconclusive given patient's body habitus and pain in leg *Pain  greatly improved with decreasing edema and adjustments in pain meds on  10/29 * I am becoming more suspicious that the LE findings are more related to chronic venous stasis than a true infection - will d/c abx and follow clinically   Acute renal failure *With adequate diuresis creatinine initially was decreasing but is now increasing and she has developed some tachycardia so may be close to dry weight-continue to monitor now that Lasix infusion has been reinstituted   Hyperbilirubinemia *Not obstructive in nature and likely secondary to passive hepatic congestion order to unknown infiltrative process as noted above  Acute gouty arthropathy *Currently responding well to steroids - taper in process  Morbid obesity with BMI of 50.0-59.9, adult *May have underlying OSA/OHS *Also has striking masculine features - total testosterone level is < 10 making PCOD very unlikely  DVT prophylaxis: Subcutaneous heparin 3 times a day Code Status: Full Family Communication: Spoke to patient Disposition Plan: step down  Consultants: Heart failure team  Procedures: Skin and soft tissue biopsy of abdominal wall by Dr. Leticia Penna on 12/18/2011  Antibiotics: Doxycycline 10/21 >>> 10/28 Levaquin 10/28 >>>10/30  HPI/Subjective: Chest head wound care performed to left lower extremity so complaining of pain but otherwise pain has been well-controlled. Denies shortness of breath or chest pain at present   Objective: Blood pressure 127/76, pulse 88, temperature 98 F (36.7 C), temperature source Oral, resp. rate 11, height 5\' 11"  (1.803 m), weight 162.7 kg (358 lb 11 oz), last menstrual period 12/01/2011, SpO2 100.00%.  Intake/Output Summary (Last 24 hours) at 12/27/11 1337 Last data filed at 12/27/11 0600  Gross per 24 hour  Intake 1105.8 ml  Output   5775 ml  Net -4669.2 ml     Exam: General: No acute respiratory distress Lungs: Clear to auscultation bilaterally without wheezes or crackles - BS  distant th/o  Cardiovascular: Regular rate and rhythm without murmur gallop or rub normal S1 and S2, still has significant edema bilateral legs but has decreased significantly in the past 48 hrs, Lasix infusion resumed- Milrinone infusion continued immediately post right heart cath on 12/26/2011 Abdomen: Nontender, nondistended, soft, bowel sounds positive, no rebound, no ascites, no appreciable mass Musculoskeletal: No significant cyanosis, clubbing of bilateral lower extremities Neurological: Alert and oriented x 3, MOE x 4, non focal  Data Reviewed: Basic Metabolic Panel:  Lab 12/27/11 1610 12/26/11 1730 12/26/11 0450 12/25/11 0500 12/24/11 0600 12/23/11 2344  NA 136 -- 135 136 135 132*  K 4.0 -- 3.9 4.5 4.7 4.9  CL 93* -- 91* 92* 96 94*  CO2 34* -- 33* 32 29 28  GLUCOSE 102* -- 118* 83 89 99  BUN 79* -- 83* 81* 81* 81*  CREATININE 2.36* 2.37* 2.35* 2.28* 2.22* --  CALCIUM 8.9 -- 9.1 9.4 9.3 9.0  MG -- -- -- -- -- --  PHOS -- -- -- -- -- --   Liver Function Tests:  Lab 12/26/11 0450 12/24/11 0600  AST 20 24  ALT 18 22  ALKPHOS 134* 120*  BILITOT 3.1* 3.3*  PROT 7.6 7.7  ALBUMIN 2.4* 2.4*   CBC:  Lab 12/27/11 1000 12/27/11 0450 12/26/11 1730 12/24/11 0600 12/23/11 0833  WBC -- 29.1* 30.8* 25.6* 28.6*  NEUTROABS PENDING -- -- -- --  HGB -- 7.3* 7.3* 8.9* 8.3*  HCT -- 22.2* 22.1* 26.1* 24.1*  MCV -- 83.8 84.0 84.5 84.3  PLT -- 281 301 328 301    Basename 12/14/11 2102 12/11/11 1330  PROBNP 10792.0* 10471.0*     Studies:  Recent x-ray studies have been reviewed in detail  by the Attending Physician  Scheduled Meds:  Reviewed in detail by the Attending Physician  Rubie Maid, ANP PAGER: [phone] [facility] OFFICE: [phone]   If 7PM-7AM, please contact night-coverage [email or web address] Password [email or web address] 12/27/2011, 1:37 PM   LOS: 17 days   I have examined the patient and reviewed the chart. I agree with the above note which I have modified.   Calvert Cantor, MD [phone]

## 2011-12-28 ENCOUNTER — Other Ambulatory Visit (HOSPITAL_COMMUNITY): Payer: Medicare Other

## 2011-12-28 ENCOUNTER — Ambulatory Visit (HOSPITAL_COMMUNITY): Payer: Medicare Other

## 2011-12-28 ENCOUNTER — Inpatient Hospital Stay (HOSPITAL_COMMUNITY): Payer: Medicare Other

## 2011-12-28 DIAGNOSIS — L02419 Cutaneous abscess of limb, unspecified: Secondary | ICD-10-CM

## 2011-12-28 DIAGNOSIS — D6489 Other specified anemias: Secondary | ICD-10-CM | POA: Insufficient documentation

## 2011-12-28 DIAGNOSIS — I319 Disease of pericardium, unspecified: Secondary | ICD-10-CM

## 2011-12-28 DIAGNOSIS — L03119 Cellulitis of unspecified part of limb: Secondary | ICD-10-CM | POA: Diagnosis not present

## 2011-12-28 LAB — URINALYSIS, ROUTINE W REFLEX MICROSCOPIC
Bilirubin Urine: NEGATIVE
Hgb urine dipstick: NEGATIVE
Ketones, ur: NEGATIVE mg/dL
Protein, ur: NEGATIVE mg/dL
Urobilinogen, UA: 2 mg/dL — ABNORMAL HIGH (ref 0.0–1.0)

## 2011-12-28 LAB — TYPE AND SCREEN
ABO/RH(D): O POS
Antibody Screen: NEGATIVE
Unit division: 0

## 2011-12-28 LAB — ANA: Anti Nuclear Antibody(ANA): NEGATIVE

## 2011-12-28 LAB — CK: Total CK: 17 U/L (ref 7–177)

## 2011-12-28 LAB — CBC
HCT: 26.8 % — ABNORMAL LOW (ref 36.0–46.0)
HCT: 26.1 % — ABNORMAL LOW (ref 36.0–46.0)
Hemoglobin: 9 g/dL — ABNORMAL LOW (ref 12.0–15.0)
Hemoglobin: 8.7 g/dL — ABNORMAL LOW (ref 12.0–15.0)
MCH: 28.1 pg (ref 26.0–34.0)
MCHC: 33.6 g/dL (ref 30.0–36.0)
MCHC: 33.3 g/dL (ref 30.0–36.0)
MCV: 84.3 fL (ref 78.0–100.0)
RDW: 16.7 % — ABNORMAL HIGH (ref 11.5–15.5)

## 2011-12-28 LAB — BASIC METABOLIC PANEL
BUN: 75 mg/dL — ABNORMAL HIGH (ref 6–23)
Chloride: 93 mEq/L — ABNORMAL LOW (ref 96–112)
Creatinine, Ser: 2.48 mg/dL — ABNORMAL HIGH (ref 0.50–1.10)
GFR calc non Af Amer: 23 mL/min — ABNORMAL LOW (ref >90)
Glucose, Bld: 104 mg/dL — ABNORMAL HIGH (ref 70–99)
Potassium: 4.1 mEq/L (ref 3.5–5.1)

## 2011-12-28 LAB — URINE MICROSCOPIC-ADD ON

## 2011-12-28 MED ORDER — MIDAZOLAM HCL 2 MG/2ML IJ SOLN
INTRAMUSCULAR | Status: AC | PRN
  Administered 2011-12-28 (×2): 0.5 mg via INTRAVENOUS
  Administered 2011-12-28: 1 mg via INTRAVENOUS

## 2011-12-28 MED ORDER — VANCOMYCIN HCL 1000 MG IV SOLR
2000.0000 mg | Freq: Once | INTRAVENOUS | Status: AC
  Administered 2011-12-28: 2000 mg via INTRAVENOUS
  Filled 2011-12-28: qty 2000

## 2011-12-28 MED ORDER — MORPHINE SULFATE 2 MG/ML IJ SOLN
2.0000 mg | Freq: Once | INTRAMUSCULAR | Status: AC
  Administered 2011-12-28: 2 mg via INTRAVENOUS

## 2011-12-28 MED ORDER — FENTANYL CITRATE 0.05 MG/ML IJ SOLN
INTRAMUSCULAR | Status: AC
  Filled 2011-12-28: qty 4

## 2011-12-28 MED ORDER — FENTANYL CITRATE 0.05 MG/ML IJ SOLN
INTRAMUSCULAR | Status: AC | PRN
  Administered 2011-12-28 (×3): 50 ug via INTRAVENOUS

## 2011-12-28 MED ORDER — VANCOMYCIN HCL 1000 MG IV SOLR
1250.0000 mg | INTRAVENOUS | Status: DC
  Administered 2011-12-29 – 2011-12-31 (×3): 1250 mg via INTRAVENOUS
  Filled 2011-12-28 (×4): qty 1250

## 2011-12-28 MED ORDER — MIDAZOLAM HCL 2 MG/2ML IJ SOLN
INTRAMUSCULAR | Status: AC
  Filled 2011-12-28: qty 4

## 2011-12-28 MED ORDER — LEVOFLOXACIN IN D5W 750 MG/150ML IV SOLN
750.0000 mg | INTRAVENOUS | Status: DC
  Administered 2011-12-28 – 2011-12-31 (×4): 750 mg via INTRAVENOUS
  Filled 2011-12-28 (×5): qty 150

## 2011-12-28 NOTE — Progress Notes (Signed)
OT Cancellation Note  Patient Details Name: [name] MRN: [id number] DOB: [birth year]   Cancelled Treatment:    Reason Eval/Treat Not Completed: Medical issues which prohibited therapy.  Will continue to check back at later times.  Sundance Moise 12/28/2011, 12:47 PM

## 2011-12-28 NOTE — Procedures (Signed)
Successful RT ILIAC BM ASP AND CORE BX NO COMP STABLE FULL REPORT IN PACS PATH PENDING  

## 2011-12-28 NOTE — ED Notes (Signed)
Bedside monitor quit, applied pt to [place], HR 90-110

## 2011-12-28 NOTE — Progress Notes (Signed)
PT Cancellation Note  Patient Details Name: LATARSHIA JERSEY MRN: [id number] DOB: Oct 24, 1970   Cancelled Treatment:     PT session held today per RN request due to pt cont's to have severe LE pain despite all pain medication available (receiving every 2 hrs per RN), also RN reports pt to have MRI & biopsy at some point today.       Verdell Face, [place] [phone] 12/28/2011

## 2011-12-28 NOTE — Progress Notes (Signed)
Advanced Heart Failure Rounding Note   Subjective:    41 yo with history of HTN, gout, morbid obesity, atrial fibrillation and diastolic heart failure.  Admitted to [facility] on 10/14 for massive volume overload.  Transferred to [facility] on 10/26 due to worsening renal function, weakness and volume overload.   Echo: diastolic heart failure with ejection fraction of 55-60%, severe LVH, Mildly dilated RV.  Mildly to mod dilated RA.  moderate pericardial effusion and echocardiographic changes consistent with a possible infiltrative disease.  PAPP 72 mmHg  Fat pad bx negative for amyloid.  Bone marrow aspiration and biopsy was planned for outpatient.  Cr 1.57>>>>2.43>>2.22>2.28>2.35>2.36>2.48  Milrinone stopped 10/30 after RHC c/w high output HF RA = 16  RV = 81/14/20  PA = 78/32 (52)  PCW = 21  Fick cardiac output/index = 11.9/4.6  Thermo CO/CI = 9.8/3.8  PVR = 2.6 Woods (Fick)  FA sat = 91%  PA sat = 65%, 72%  SVC sat = 73%  RA sat = 66%  Continues on lasix drip at 5. Net I/Os: Another - 5 liters yesterday.  Got 2 units RBCs 10/31 for anemia. Continues with severe pain in LLE. Pending bone marrow bx.  We stood her up weight personally to get her weight this am 305 pounds - down 100 pounds.  Swan #s:  CVP 14 PA 79/36 PCWP 17 CO/CI 7.0/2.7 PVR 4.2   Objective:    Vital Signs:   Temp:  [98 F (36.7 C)-100.4 F (38 C)] 99.9 F (37.7 C) (11/01 0700) Pulse Rate:  [43-106] 97  (11/01 0700) Resp:  [11-24] 21  (11/01 0700) BP: (127-167)/(72-107) 139/80 mmHg (11/01 0700) SpO2:  [89 %-100 %] 93 % (11/01 0700) Weight:  [148.6 kg (327 lb 9.7 oz)] 148.6 kg (327 lb 9.7 oz) (11/01 0500) Last BM Date: 12/25/11  Weight change: Filed Weights   12/26/11 1657 12/27/11 0500 12/28/11 0500  Weight: 149 kg (328 lb 7.8 oz) 162.7 kg (358 lb 11 oz) 148.6 kg (327 lb 9.7 oz)    Intake/Output:   Intake/Output Summary (Last 24 hours) at 12/28/11 0753 Last data filed at 12/28/11 0700  Gross per 24  hour  Intake   1572 ml  Output   5951 ml  Net  -4379 ml     Physical Exam: General:  Obese, well appearing. No resp difficulty.  HEENT: normal Neck: supple. RIJ swan Carotids 2+ bilat; no bruits. No lymphadenopathy or thryomegaly appreciated. Cor: PMI nondisplaced. + probable RV lift Regular rate & rhythm. No rubs, gallops or murmurs. Lungs: clear anteriorl Abdomen: obese, soft, nontender, nondistended. Good bowel sounds. Extremities: no cyanosis, clubbing, rash, no edema to thigh. +calor/erythema and painful to touch Cellulitis/lymphedema on L. RUE PICC. Large gouty nodule R elbow Neuro: alert & orientedx3, cranial nerves grossly intact. moves all 4 extremities w/o difficulty. Affect pleasant  Telemetry: AF 90-105  Labs: Basic Metabolic Panel:  Lab 12/28/11 1610 12/27/11 0450 12/26/11 1730 12/26/11 0450 12/25/11 0500 12/24/11 0600  NA 138 136 -- 135 136 135  K 4.1 4.0 -- 3.9 4.5 4.7  CL 93* 93* -- 91* 92* 96  CO2 34* 34* -- 33* 32 29  GLUCOSE 104* 102* -- 118* 83 89  BUN 75* 79* -- 83* 81* 81*  CREATININE 2.48* 2.36* 2.37* 2.35* 2.28* --  CALCIUM 9.0 8.9 -- 9.1 -- --  MG -- -- -- -- -- --  PHOS -- -- -- -- -- --    Liver Function Tests:  Lab 12/26/11  0450 12/24/11 0600  AST 20 24  ALT 18 22  ALKPHOS 134* 120*  BILITOT 3.1* 3.3*  PROT 7.6 7.7  ALBUMIN 2.4* 2.4*   No results found for this basename: LIPASE:5,AMYLASE:5 in the last 168 hours No results found for this basename: AMMONIA:3 in the last 168 hours  CBC:  Lab 12/28/11 0501 12/28/11 12/27/11 1000 12/27/11 0450 12/26/11 1730 12/24/11 0600  WBC 35.0* 33.1* -- 29.1* 30.8* 25.6*  NEUTROABS -- -- 23.8* -- -- --  HGB 8.7* 9.0* -- 7.3* 7.3* 8.9*  HCT 26.1* 26.8* -- 22.2* 22.1* 26.1*  MCV 84.2 84.3 -- 83.8 84.0 84.5  PLT 261 252 -- 281 301 328    Cardiac Enzymes: No results found for this basename: CKTOTAL:5,CKMB:5,CKMBINDEX:5,TROPONINI:5 in the last 168 hours  BNP: BNP (last 3 results)  Basename  12/14/11 2102 12/11/11 1330  PROBNP 10792.0* 10471.0*       Medications:     Scheduled Medications:    . amLODipine  10 mg Oral Daily  . aspirin  81 mg Oral Daily  . colchicine  0.6 mg Oral Daily  . ferumoxytol  510 mg Intravenous Once  . folic acid  1 mg Oral Daily  . heparin  5,000 Units Subcutaneous Q8H  . labetalol  400 mg Oral BID  . pantoprazole  40 mg Oral Daily  . sodium chloride  10-40 mL Intracatheter Q12H  . sodium chloride  3 mL Intravenous Q12H  . sodium chloride  3 mL Intravenous Q12H    Infusions:    . sodium chloride 10 mL/hr at 12/25/11 2000  . sodium chloride 7 mL/hr (12/25/11 2000)  . sodium chloride 20 mL/hr at 12/27/11 1600  . furosemide (LASIX) infusion 5 mg/hr (12/27/11 2000)    PRN Medications: sodium chloride, acetaminophen, hydrALAZINE, morphine injection, ondansetron (ZOFRAN) IV, oxyCODONE-acetaminophen, promethazine, sodium chloride, sodium chloride   Assessment:   1. Acute on chronic diastolic HF 2. 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 Atrial fibrillation 4. Morbid obesity, BMI 54.8  5. HTN, uncontrolled 6. Acute on chronic renal failure (baseline Cr 1.5) 7. Cellulitis 8. Anemia  Plan/Discussion:    From a cardiac standpoint, the main issue here seems to be high output HF with high right sided pressures. She has done well with diuresis and lost 100 pounds however renal function continues to deteriorate despite adequate filling pressures. We will stop lasix today and follow. She received 2 u RBCs to help with anemia. Echo with bubble study pending today to assess for peripheral shunting. I am very concerned about her infection in her LLE as it is becoming increasing painful and erythematous and her white count has now doubled. Would strongly suggest getting an MRI of the leg +/- surgical consult to evaluate - this is likely also driving her high output state.   AF is chronic - has not been coumadin candidate due to  noncompliance in past  She is pending bone marrow bx today to help assess for possible amyloid. Would leave swan in for another day or so.  Recs for today: 1) Stop lasix 2) Keep swan in 3) Echo with bubble study 4) Consider MRI +/- surgical consultation for worsening LLE infection. Will need to consider broadening abx as well.  The patient is critically ill with multiple organ systems failure and requires high complexity decision making for assessment and support, frequent evaluation and titration of therapies, application of advanced monitoring technologies and extensive interpretation of multiple databases.   Critical Care  Time devoted to patient care services described in this note is 35 Minutes.   Length of Stay: 86 Euva Rundell,MD 7:53 AM

## 2011-12-28 NOTE — Consult Note (Signed)
Agree with PA-White's consult note Pt with what seems to be venous disease/lymphedema with some superficial cellulitis Would recommend vascular consult to eval Rec con't abx Will follow

## 2011-12-28 NOTE — Consult Note (Signed)
Reason for Consult: Left leg cellulitis Referring Physician: A. Rennis Harding, NP  [name] is an 41 y.o. female.  HPI: 41 yr old female who was originally admitted to [facility] due to severe volume overload and acute heart failure.  She was transferred to [facility] due to worsening renal function and on going heart failure.  She originally came in with severe bilateral lower extremity edema which has improved since she has diuresised about 100 pounds since admission.  She denies any history of cellulitis, DVT, venous insuff, PAD, non-healing ulcers or LE edema.  She does have a family history of LE edema and venous insuff.  She has developed significant pain and ulcers since admission which as progressed to extensive cellulitis.  She also has maintained leukocytosis which is worsening.  Past Medical History  Diagnosis Date  . Essential hypertension, benign     History of accelerated hypertension with urgency  . Atrial flutter     Documented 2006 - [facility]  . Gout   . History of cardiac catheterization     Ectatic coronaries without obstruction 2006 - [facility]  . Morbid obesity   . Hypertensive heart disease     LVEF reportedly 50-55% in 2006 - [facility]  . CKD (chronic kidney disease) stage 3, GFR 30-59 ml/min     Past Surgical History  Procedure Date  . Skin biopsy 12/18/2011    Procedure: BIOPSY SKIN;  Surgeon: Fabio Bering, MD;  Location: [facility];  Service: General;  Laterality: N/A;  in the minor room.    Family History  Problem Relation Age of Onset  . Hypertension      Social History:  reports that she has never smoked. She does not have any smokeless tobacco history on file. She reports that she does not drink alcohol or use illicit drugs.  Allergies:  Allergies  Allergen Reactions  . Penicillins Hives    Childhood allergy    Medications: I have reviewed the patient's current medications.  Results for orders placed during the hospital encounter of 12/10/11 (from the past 48  hour(s))  POCT I-STAT 3, BLOOD GAS (G3P V)     Status: Abnormal   Collection Time   12/26/11  2:31 PM      Component Value Range Comment   pH, Ven 7.471 (*) 7.250 - 7.300    pCO2, Ven 46.4  45.0 - 50.0 mmHg    pO2, Ven 32.0  30.0 - 45.0 mmHg    Bicarbonate 33.9 (*) 20.0 - 24.0 mEq/L    TCO2 35  0 - 100 mmol/L    O2 Saturation 65.0      Acid-Base Excess 9.0 (*) 0.0 - 2.0 mmol/L    Sample type VENOUS      Comment NOTIFIED PHYSICIAN     POCT I-STAT 3, BLOOD GAS (G3P V)     Status: Abnormal   Collection Time   12/26/11  2:31 PM      Component Value Range Comment   pH, Ven 7.462 (*) 7.250 - 7.300    pCO2, Ven 45.1  45.0 - 50.0 mmHg    pO2, Ven 36.0  30.0 - 45.0 mmHg    Bicarbonate 32.2 (*) 20.0 - 24.0 mEq/L    TCO2 34  0 - 100 mmol/L    O2 Saturation 72.0      Acid-Base Excess 8.0 (*) 0.0 - 2.0 mmol/L    Sample type VENOUS      Comment NOTIFIED PHYSICIAN     POCT  I-STAT 3, BLOOD GAS (G3P V)     Status: Abnormal   Collection Time   12/26/11  2:41 PM      Component Value Range Comment   pH, Ven 7.484 (*) 7.250 - 7.300    pCO2, Ven 47.0  45.0 - 50.0 mmHg    pO2, Ven 36.0  30.0 - 45.0 mmHg    Bicarbonate 35.3 (*) 20.0 - 24.0 mEq/L    TCO2 37  0 - 100 mmol/L    O2 Saturation 73.0      Acid-Base Excess 11.0 (*) 0.0 - 2.0 mmol/L    Sample type VENOUS     POCT I-STAT 3, BLOOD GAS (G3P V)     Status: Abnormal   Collection Time   12/26/11  2:46 PM      Component Value Range Comment   pH, Ven 7.459 (*) 7.250 - 7.300    pCO2, Ven 48.1  45.0 - 50.0 mmHg    pO2, Ven 30.0  30.0 - 45.0 mmHg    Bicarbonate 34.1 (*) 20.0 - 24.0 mEq/L    TCO2 36  0 - 100 mmol/L    O2 Saturation 60.0      Acid-Base Excess 9.0 (*) 0.0 - 2.0 mmol/L    Sample type VENOUS      Comment NOTIFIED PHYSICIAN     POCT I-STAT 3, BLOOD GAS (G3P V)     Status: Abnormal   Collection Time   12/26/11  2:51 PM      Component Value Range Comment   pH, Ven 7.484 (*) 7.250 - 7.300    pCO2, Ven 47.5  45.0 - 50.0 mmHg     pO2, Ven 32.0  30.0 - 45.0 mmHg    Bicarbonate 35.7 (*) 20.0 - 24.0 mEq/L    TCO2 37  0 - 100 mmol/L    O2 Saturation 66.0      Acid-Base Excess 11.0 (*) 0.0 - 2.0 mmol/L    Sample type VENOUS      Comment NOTIFIED PHYSICIAN     CBC     Status: Abnormal   Collection Time   12/26/11  5:30 PM      Component Value Range Comment   WBC 30.8 (*) 4.0 - 10.5 K/uL    RBC 2.63 (*) 3.87 - 5.11 MIL/uL    Hemoglobin 7.3 (*) 12.0 - 15.0 g/dL    HCT 98.1 (*) 19.1 - 46.0 %    MCV 84.0  78.0 - 100.0 fL    MCH 27.8  26.0 - 34.0 pg    MCHC 33.0  30.0 - 36.0 g/dL    RDW 47.8 (*) 29.5 - 15.5 %    Platelets 301  150 - 400 K/uL   CREATININE, SERUM     Status: Abnormal   Collection Time   12/26/11  5:30 PM      Component Value Range Comment   Creatinine, Ser 2.37 (*) 0.50 - 1.10 mg/dL    GFR calc non Af Amer 25 (*) >90 mL/min    GFR calc Af Amer 29 (*) >90 mL/min   CBC     Status: Abnormal   Collection Time   12/27/11  4:50 AM      Component Value Range Comment   WBC 29.1 (*) 4.0 - 10.5 K/uL    RBC 2.65 (*) 3.87 - 5.11 MIL/uL    Hemoglobin 7.3 (*) 12.0 - 15.0 g/dL    HCT 62.1 (*) 30.8 - 46.0 %    MCV  83.8  78.0 - 100.0 fL    MCH 27.5  26.0 - 34.0 pg    MCHC 32.9  30.0 - 36.0 g/dL    RDW 16.1 (*) 09.6 - 15.5 %    Platelets 281  150 - 400 K/uL   BASIC METABOLIC PANEL     Status: Abnormal   Collection Time   12/27/11  4:50 AM      Component Value Range Comment   Sodium 136  135 - 145 mEq/L    Potassium 4.0  3.5 - 5.1 mEq/L    Chloride 93 (*) 96 - 112 mEq/L    CO2 34 (*) 19 - 32 mEq/L    Glucose, Bld 102 (*) 70 - 99 mg/dL    BUN 79 (*) 6 - 23 mg/dL    Creatinine, Ser 0.45 (*) 0.50 - 1.10 mg/dL    Calcium 8.9  8.4 - 40.9 mg/dL    GFR calc non Af Amer 25 (*) >90 mL/min    GFR calc Af Amer 29 (*) >90 mL/min   DIFFERENTIAL     Status: Abnormal   Collection Time   12/27/11 10:00 AM      Component Value Range Comment   Neutro Abs 23.8 (*) 1.7 - 7.7 K/uL    Lymphs Abs 2.0  0.7 - 4.0 K/uL     Monocytes Absolute 1.3 (*) 0.1 - 1.0 K/uL    Eosinophils Absolute 0.2  0.0 - 0.7 K/uL    Basophils Absolute 0.0  0.0 - 0.1 K/uL    Neutrophils Relative 87 (*) 43 - 77 %    Lymphocytes Relative 7 (*) 12 - 46 %    Monocytes Relative 5  3 - 12 %    Eosinophils Relative 1  0 - 5 %    Basophils Relative 0  0 - 1 %    RBC Morphology POLYCHROMASIA PRESENT      WBC Morphology MILD LEFT SHIFT (1-5% METAS, OCC MYELO, OCC BANDS)   ATYPICAL LYMPHOCYTES  TYPE AND SCREEN     Status: Normal (Preliminary result)   Collection Time   12/27/11 11:15 AM      Component Value Range Comment   ABO/RH(D) O POS      Antibody Screen NEG      Sample Expiration 12/30/2011      Unit Number [id number]      Blood Component Type RBC LR PHER1      Unit division 00      Status of Unit ISSUED      Transfusion Status OK TO TRANSFUSE      Crossmatch Result Compatible      Unit Number [id number]      Blood Component Type RBC LR PHER2      Unit division 00      Status of Unit ISSUED      Transfusion Status OK TO TRANSFUSE      Crossmatch Result Compatible     PREPARE RBC (CROSSMATCH)     Status: Normal   Collection Time   12/27/11 11:15 AM      Component Value Range Comment   Order Confirmation ORDER PROCESSED BY BLOOD BANK     ABO/RH     Status: Normal   Collection Time   12/27/11 11:15 AM      Component Value Range Comment   ABO/RH(D) O POS     PROCALCITONIN     Status: Normal   Collection Time   12/27/11  2:00 PM  Component Value Range Comment   Procalcitonin 1.26     CBC     Status: Abnormal   Collection Time   12/28/11 12:00 AM      Component Value Range Comment   WBC 33.1 (*) 4.0 - 10.5 K/uL    RBC 3.18 (*) 3.87 - 5.11 MIL/uL    Hemoglobin 9.0 (*) 12.0 - 15.0 g/dL    HCT 72.5 (*) 36.6 - 46.0 %    MCV 84.3  78.0 - 100.0 fL    MCH 28.3  26.0 - 34.0 pg    MCHC 33.6  30.0 - 36.0 g/dL    RDW 44.0 (*) 34.7 - 15.5 %    Platelets 252  150 - 400 K/uL   OCCULT BLOOD X 1 CARD TO LAB, STOOL      Status: Normal   Collection Time   12/28/11  3:05 AM      Component Value Range Comment   Fecal Occult Bld NEGATIVE     BASIC METABOLIC PANEL     Status: Abnormal   Collection Time   12/28/11  5:01 AM      Component Value Range Comment   Sodium 138  135 - 145 mEq/L    Potassium 4.1  3.5 - 5.1 mEq/L    Chloride 93 (*) 96 - 112 mEq/L    CO2 34 (*) 19 - 32 mEq/L    Glucose, Bld 104 (*) 70 - 99 mg/dL    BUN 75 (*) 6 - 23 mg/dL    Creatinine, Ser 4.25 (*) 0.50 - 1.10 mg/dL    Calcium 9.0  8.4 - 95.6 mg/dL    GFR calc non Af Amer 23 (*) >90 mL/min    GFR calc Af Amer 27 (*) >90 mL/min   CBC     Status: Abnormal   Collection Time   12/28/11  5:01 AM      Component Value Range Comment   WBC 35.0 (*) 4.0 - 10.5 K/uL    RBC 3.10 (*) 3.87 - 5.11 MIL/uL    Hemoglobin 8.7 (*) 12.0 - 15.0 g/dL    HCT 38.7 (*) 56.4 - 46.0 %    MCV 84.2  78.0 - 100.0 fL    MCH 28.1  26.0 - 34.0 pg    MCHC 33.3  30.0 - 36.0 g/dL    RDW 33.2 (*) 95.1 - 15.5 %    Platelets 261  150 - 400 K/uL   PROTIME-INR     Status: Abnormal   Collection Time   12/28/11  5:01 AM      Component Value Range Comment   Prothrombin Time 18.7 (*) 11.6 - 15.2 seconds    INR 1.62 (*) 0.00 - 1.49   APTT     Status: Abnormal   Collection Time   12/28/11  5:01 AM      Component Value Range Comment   aPTT 38 (*) 24 - 37 seconds     No results found.  Review of Systems  Constitutional: Positive for fever and weight loss (100 pounds since admission). Negative for chills.  HENT: Negative.   Eyes: Negative.   Respiratory: Negative.   Cardiovascular: Positive for leg swelling. Negative for chest pain and palpitations.  Gastrointestinal: Negative.   Genitourinary: Negative.   Musculoskeletal: Positive for myalgias and joint pain (left ankle).  Skin:       Severe leg edema on admission that had been acute in nature, better since elevating and off feet.  Ulcers  have developed since admission  Neurological: Negative.     Endo/Heme/Allergies: Negative.   Psychiatric/Behavioral: Negative.    Blood pressure 147/87, pulse 111, temperature 99.9 F (37.7 C), temperature source Core (Comment), resp. rate 18, height 5\' 11"  (1.803 m), weight 305 lb (138.347 kg), last menstrual period 12/01/2011, SpO2 93.00%. Physical Exam  Constitutional: She is oriented to person, place, and time. She appears well-developed and well-nourished. No distress.       Morbidly obese   HENT:  Head: Normocephalic and atraumatic.       RIJ Swan   Eyes: Conjunctivae normal are normal. Pupils are equal, round, and reactive to light.  Neck: Normal range of motion. Neck supple.  Cardiovascular: Normal rate and regular rhythm.   Respiratory: Breath sounds normal.  GI: Soft. Bowel sounds are normal.  Genitourinary:       Deferred   Musculoskeletal: She exhibits edema (severe bilateral lower extremities that you can has improved signficantly).  Neurological: She is alert and oriented to person, place, and time.  Skin: Skin is warm.       5.5X5X.1cm full thickness wound lateral left leg.  Surrounding skin with previous blisters with partial thickness ulcers now.  Large amt yellow drainage, generalized edema.  Diffuse erythema along the medial and lateral leg extending to just below the knee  Severe stasis changes suggesting a more long term issue (whether from fluid volume overload or venous insuff)   Psychiatric: She has a normal mood and affect. Her behavior is normal.    Assessment/Plan: 1.  Extensive Left leg cellulitis with ulcers: pt is scheduled for MRA of the left leg to further eval the cellulitis and see if there is a focal abscess.  Likely this is just diffuse cellulitis and will need IV abx, elevation and wound care.  Would consider a vascular consult as well as this may be a chronic issue such as venous insuff (readily palp pedal pulses so do not believe there is a PAD component). May need functional venous study as outpat  but likely will show reflux now due to fluid volume overload.  Will follow up after MRA of leg.  Kimberlea Schlag 12/28/2011, 9:52 AM

## 2011-12-28 NOTE — Progress Notes (Signed)
ANTIBIOTIC CONSULT NOTE - INITIAL  Pharmacy Consult for vancomycin/levaquin Indication: LLE cellulitis, r/o abscess  Allergies  Allergen Reactions  . Penicillins Hives    Childhood allergy    Patient Measurements: Height: 5\' 11"  (180.3 cm) Weight: 305 lb (138.347 kg) IBW/kg (Calculated) : 70.8   Vital Signs: Temp: 99.9 F (37.7 C) (11/01 0700) BP: 147/87 mmHg (11/01 0900) Pulse Rate: 111  (11/01 0900) Intake/Output from previous day: 10/31 0701 - 11/01 0700 In: 1572 [P.O.:120; I.V.:870; Blood:582] Out: 5951 [Urine:5950; Stool:1] Intake/Output from this shift:    Labs:  [facility] 12/28/11 0501 12/28/11 12/27/11 0450 12/26/11 1730  WBC 35.0* 33.1* 29.1* --  HGB 8.7* 9.0* 7.3* --  PLT 261 252 281 --  LABCREA -- -- -- --  CREATININE 2.48* -- 2.36* 2.37*   Estimated Creatinine Clearance: 47 ml/min (by C-G formula based on Cr of 2.48). No results found for this basename: VANCOTROUGH:2,VANCOPEAK:2,VANCORANDOM:2,GENTTROUGH:2,GENTPEAK:2,GENTRANDOM:2,TOBRATROUGH:2,TOBRAPEAK:2,TOBRARND:2,AMIKACINPEAK:2,AMIKACINTROU:2,AMIKACIN:2, in the last 72 hours   Microbiology: Recent Results (from the past 720 hour(s))  CULTURE, BLOOD (ROUTINE X 2)     Status: Normal   Collection Time   12/15/11 10:55 PM      Component Value Range Status Comment   Specimen Description BLOOD LEFT HAND   Final    Special Requests BOTTLES DRAWN AEROBIC AND ANAEROBIC 7CC   Final    Culture NO GROWTH 5 DAYS   Final    Report Status 12/20/2011 FINAL   Final   CULTURE, BLOOD (ROUTINE X 2)     Status: Normal   Collection Time   12/15/11 11:00 PM      Component Value Range Status Comment   Specimen Description BLOOD LEFT ARM   Final    Special Requests BOTTLES DRAWN AEROBIC ONLY 6CC   Final    Culture NO GROWTH 5 DAYS   Final    Report Status 12/20/2011 FINAL   Final   MRSA PCR SCREENING     Status: Normal   Collection Time   12/23/11  2:45 AM      Component Value Range Status Comment   MRSA by PCR  NEGATIVE  NEGATIVE Final     Medical History: Past Medical History  Diagnosis Date  . Essential hypertension, benign     History of accelerated hypertension with urgency  . Atrial flutter     Documented 2006 - [facility]  . Gout   . History of cardiac catheterization     Ectatic coronaries without obstruction 2006 - [facility]  . Morbid obesity   . Hypertensive heart disease     LVEF reportedly 50-55% in 2006 - [facility]  . CKD (chronic kidney disease) stage 3, GFR 30-59 ml/min    Assessment: 42 year old female well known to pharmacy service for previous antibiotic dosing. She continues to have persistent fevers and wbc increasing to 35 this morning. With concern for ongoing infection, will restart vancomycin/levaquin, noted that patient's renal function is actually declining. Will load patient with vancomycin, patient did have an elevated vancomycin trough at [facility], will need to watch dosing closely. If renal function worsens overnight, her levaquin will need to be changed to every 48 hours.  Goal of Therapy:  Vancomycin trough level 15-20 mcg/ml-will shoot for higher trough until abscess is ruled out  Plan:  Measure antibiotic drug levels at steady state Follow up culture results Vancomycin 2g load with 1250 mg q 24 - will check level at steady state Levaquin 750mg  q 24 hours - may need dose adjustment if renal fx  worsens  Severiano Gilbert 12/28/2011,9:40 AM

## 2011-12-28 NOTE — Progress Notes (Signed)
[facility] Progress Note [place] TEAM 1 - Stepdown/ICU TEAM   [name] NFA:[id number] DOB: [birth year] DOA: 12/10/2011 PCP: [name]., MD  Brief narrative: The patient is a morbidly obese 41 yo woman with a h/o HTN, poorly controlled, who was admitted with volume overload and because of being refractory to diuresis is admitted in transfer from [facility] for ongoing therapy. She has a h/o non-compliance. She has massive peripheral edema and has been obese for many years. She also has a h/o gout with flairs in the past.  Assessment/Plan:  Acute right heart failure/Anasarca/known diastolic dysfunction with chronic heart failure- high output heart failure with high right-sided pressures Care as per HF Team  ? 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 Had radiographic findings including hepatosplenomegaly concerning for gammaglobulin abnormality or dysfunction. *She is status post bone marrow biopsy in interventional radiology 12/28/11  Pulmonary HTN-severe *Continuing Norvasc  *See above regarding high-output heart failure  Anemia *Could be indicative of dilutional status *Baseline hemoglobin on 12/10/2011 was 11.3 and currently hemoglobin had drifted down to 7.3 and after additional diuresis has actually increased back up to 8.7 *Cardiology recommending transfusion which would help support oxygen demand in her setting of high-output cardiac heart failure. *Ferritin on 12/11/2011 was 76, folate 3.6, iron 49, TIBC 353, saturation 14%, annual IVC 304 *TSH only mildly elevated at 4.8  Accelerated hypertension *Blood pressure now well controlled controlled  *No ACE inhibitor or ARB secondary to ongoing renal failure  Atrial fibrillation with controlled ventricular response *Has significant left atrial  enlargement of unknown duration *After adequately diuresed consideration can be given to TEE/cardioversion- her extent of compliance with anticoagulation will need to be discussed with her prior to making a decision.   Cellulitis of left leg/Fever/ Leukocytosis *Doxy switched to Levaquin 10/28-Levaquin discontinued 12/26/2011 because attending M.D. felt that the lower extremity findings were more related to chronic venous stasis than a true infection and plans were to follow clinically. Over the past 48 hours patient has had increased cellulitic skin changes that are now extending up to the left lateral thigh therefore Levaquin was resumed and vancomycin added *Since has significant leukocytosis we'll also pan culture: Obtain blood cultures, repeat urinalysis and culture-chest x-ray has been unremarkable *Appreciate surgical team evaluation-they feel this is a superficial cellulitis and recommend vascular surgery evaluation *MRI of left leg pending without contrast (poor renal function)-while an MRI was determined scan could not be done PA catheter in place. The patient's nurse discussed this with Dr. Teressa Lower who said we could DC the PA catheter but leave the introducer in the event we needed to reinsert the PA catheter at a later date.  *wound ostomy care nurse has evaluated and made recommendations *dopplers were performed but were inconclusive given patient's body habitus and pain in leg  Acute renal failure *With adequate diuresis creatinine initially was decreasing but is now increasing and she has developed some tachycardia so may be close to dry weight-continue to monitor now that Lasix infusion has been reinstituted   Hyperbilirubinemia *Not obstructive in nature and likely secondary to passive hepatic congestion order to unknown infiltrative process as noted above  Acute gouty arthropathy *Currently responding well to steroids - taper in process  Morbid obesity with BMI of 50.0-59.9,  adult *May have underlying OSA/OHS *Also has striking masculine features - total testosterone level is < 10 making PCOD very unlikely  DVT prophylaxis: Subcutaneous heparin 3 times a day Code Status: Full Family Communication: Spoke to patient Disposition Plan: step down  Consultants: Heart failure team General surgery  Procedures: Skin and soft tissue biopsy of abdominal wall by Dr. Leticia Penna on 12/18/2011  Bone marrow biopsy and interventional radiologist on 12/28/1998  Antibiotics: Doxycycline 10/21 >>> 10/28 Levaquin 10/28 >>>10/30 Levaquin 12/28/11 >>> Vancomycin 12/28/11 >>>  HPI/Subjective: Wound care performed to left lower extremity so complaining of pain but otherwise pain has been well-controlled. Denies shortness of breath or chest pain at present   Objective: Blood pressure 128/68, pulse 102, temperature 99.9 F (37.7 C), temperature source Core (Comment), resp. rate 19, height 5\' 11"  (1.803 m), weight 138.347 kg (305 lb), last menstrual period 12/01/2011, SpO2 99.00%.  Intake/Output Summary (Last 24 hours) at 12/28/11 1259 Last data filed at 12/28/11 1000  Gross per 24 hour  Intake   2217 ml  Output   5751 ml  Net  -3534 ml     Exam: General: No acute respiratory distress Lungs: Clear to auscultation bilaterally without wheezes or crackles - BS distant th/o  Cardiovascular: Regular rate and rhythm without murmur gallop or rub normal S1 and S2, still has significant edema bilateral legs but has decreased significantly in the past 48 hrs, Lasix infusion resumed- Milrinone infusion continued immediately post right heart cath on 12/26/2011 Abdomen: Nontender, nondistended, soft, bowel sounds positive, no rebound, no ascites, no appreciable mass Musculoskeletal: No significant cyanosis, clubbing of bilateral lower extremities-was ambulated to obtain weight this morning and had significant pain while attempting to weight-bear on the left leg Skin: Noted increasing  cellulitic changes on the left leg from the knee to the foot this is also radiating up the lateral thigh another 4 inches. She has several large anterior stasis ulcers with moderate fibrin debris being managed with wound care. No obvious fluctuance palpated and no purulent or foul smelling drainage noted Neurological: Alert and oriented x 3, MOE x 4, non focal  Data Reviewed: Basic Metabolic Panel:  Lab 12/28/11 1308 12/27/11 0450 12/26/11 1730 12/26/11 0450 12/25/11 0500 12/24/11 0600  NA 138 136 -- 135 136 135  K 4.1 4.0 -- 3.9 4.5 4.7  CL 93* 93* -- 91* 92* 96  CO2 34* 34* -- 33* 32 29  GLUCOSE 104* 102* -- 118* 83 89  BUN 75* 79* -- 83* 81* 81*  CREATININE 2.48* 2.36* 2.37* 2.35* 2.28* --  CALCIUM 9.0 8.9 -- 9.1 9.4 9.3  MG -- -- -- -- -- --  PHOS -- -- -- -- -- --   Liver Function Tests:  Lab 12/26/11 0450 12/24/11 0600  AST 20 24  ALT 18 22  ALKPHOS 134* 120*  BILITOT 3.1* 3.3*  PROT 7.6 7.7  ALBUMIN 2.4* 2.4*   CBC:  Lab 12/28/11 0501 12/28/11 12/27/11 1000 12/27/11 0450 12/26/11 1730 12/24/11 0600  WBC 35.0* 33.1* -- 29.1* 30.8* 25.6*  NEUTROABS -- -- 23.8* -- -- --  HGB 8.7* 9.0* -- 7.3* 7.3* 8.9*  HCT 26.1* 26.8* -- 22.2* 22.1* 26.1*  MCV 84.2 84.3 -- 83.8 84.0 84.5  PLT 261 252 -- 281 301 328    Basename 12/14/11 2102 12/11/11 1330  PROBNP 10792.0* 10471.0*     Studies:  Recent x-ray studies have been reviewed in detail by the Attending Physician  Scheduled Meds:  Reviewed in detail by the Attending Physician  Rubie Maid, ANP PAGER: [phone] [facility] OFFICE: [phone]  If 7PM-7AM, please contact night-coverage [email or web address] Password [facility] 12/28/2011,  12:59 PM   LOS: 18 days   I have personally examined this patient and reviewed the entire database. I have reviewed the above note, made any necessary editorial changes, and agree with its content.  Lonia Blood, MD [facility]

## 2011-12-28 NOTE — Progress Notes (Signed)
  Echocardiogram 2D Echocardiogram limited with Bubble study has been performed.  Ketra Duchesne FRANCES 12/28/2011, 6:51 PM

## 2011-12-29 DIAGNOSIS — I5033 Acute on chronic diastolic (congestive) heart failure: Secondary | ICD-10-CM

## 2011-12-29 DIAGNOSIS — I4891 Unspecified atrial fibrillation: Secondary | ICD-10-CM

## 2011-12-29 DIAGNOSIS — I10 Essential (primary) hypertension: Secondary | ICD-10-CM

## 2011-12-29 DIAGNOSIS — I2789 Other specified pulmonary heart diseases: Secondary | ICD-10-CM

## 2011-12-29 LAB — URINE CULTURE
Colony Count: NO GROWTH
Culture: NO GROWTH

## 2011-12-29 LAB — BASIC METABOLIC PANEL
BUN: 67 mg/dL — ABNORMAL HIGH (ref 6–23)
Calcium: 8.8 mg/dL (ref 8.4–10.5)
Creatinine, Ser: 2.41 mg/dL — ABNORMAL HIGH (ref 0.50–1.10)
GFR calc Af Amer: 28 mL/min — ABNORMAL LOW (ref >90)
GFR calc non Af Amer: 24 mL/min — ABNORMAL LOW (ref >90)

## 2011-12-29 LAB — CBC
MCHC: 33.2 g/dL (ref 30.0–36.0)
Platelets: 221 10*3/uL (ref 150–400)
RDW: 16.9 % — ABNORMAL HIGH (ref 11.5–15.5)
WBC: 34.9 10*3/uL — ABNORMAL HIGH (ref 4.0–10.5)

## 2011-12-29 MED ORDER — OXYCODONE-ACETAMINOPHEN 5-325 MG PO TABS
1.0000 | ORAL_TABLET | ORAL | Status: DC | PRN
  Administered 2011-12-29 – 2011-12-30 (×5): 2 via ORAL
  Administered 2011-12-30: 1 via ORAL
  Administered 2011-12-30 – 2011-12-31 (×6): 2 via ORAL
  Administered 2012-01-01: 1 via ORAL
  Administered 2012-01-01: 2 via ORAL
  Administered 2012-01-01: 1 via ORAL
  Administered 2012-01-01 – 2012-01-04 (×16): 2 via ORAL
  Filled 2011-12-29 (×6): qty 2
  Filled 2011-12-29: qty 1
  Filled 2011-12-29 (×14): qty 2
  Filled 2011-12-29 (×2): qty 1
  Filled 2011-12-29 (×3): qty 2
  Filled 2011-12-29 (×2): qty 1
  Filled 2011-12-29 (×4): qty 2

## 2011-12-29 MED ORDER — TORSEMIDE 20 MG PO TABS
40.0000 mg | ORAL_TABLET | Freq: Every day | ORAL | Status: DC
  Administered 2011-12-29 – 2012-01-02 (×5): 40 mg via ORAL
  Filled 2011-12-29 (×5): qty 2

## 2011-12-29 NOTE — Progress Notes (Addendum)
[facility] Progress Note [place] TEAM 1 - Stepdown/ICU TEAM   [name] ZOX:[id number] DOB: 1970/05/09 DOA: 12/10/2011 PCP: [name]., MD  Brief narrative: The patient is a morbidly obese 41 yo woman with a h/o HTN, poorly controlled, who was admitted to [facility] with massive volume overload and because of being refractory to diuresis was transfered to [facility] for ongoing therapy. She has a h/o non-compliance. She has massive peripheral edema and has been obese for many years. She also has a h/o gout with flairs in the past.  Assessment/Plan:  Acute right heart failure/Anasarca/known diastolic dysfunction with chronic heart failure Care as per HF Team  ? 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 Had radiographic findings including hepatosplenomegaly concerning for gammaglobulin abnormality or dysfunction. *She is status post bone marrow biopsy in interventional radiology 12/28/11  Pulmonary HTN-severe *Continuing CCB *See above regarding high-output heart failure  Anemia *Could be indicative of dilutional status *Baseline hemoglobin on 12/10/2011 was 11.3 - hemoglobin had drifted down to 7.3 and after additional diuresis increased back up to 8.7 *Cardiology recommending transfusion which would help support oxygen demand in her setting of high-output cardiac heart failure - pt is now s/p 2U PRBC *Ferritin on 12/11/2011 was 76, folate 3.6, iron 49, TIBC 353, saturation 14%, annual IVC 304 *TSH only mildly elevated at 4.8  Accelerated hypertension *Blood pressure now well controlled controlled  *No ACE inhibitor or ARB secondary to ongoing renal failure  Atrial fibrillation with controlled ventricular response *Has significant left atrial enlargement of unknown duration *After adequately diuresed consideration  can be given to TEE/cardioversion- her extent of compliance with anticoagulation will need to be discussed with her prior to making a decision.   Cellulitis of left leg/Fever/ Leukocytosis *Doxy switched to Levaquin 10/28-Levaquin discontinued 12/26/2011 because felt that the lower extremity findings were more related to chronic venous stasis than a true infection and plans were to follow clinically (was not erythematous at that time). Over the subsequent 48 hours patient had increased cellulitic skin changes extending up to the left lateral thigh therefore Levaquin was resumed and vancomycin added *MRI of left leg w/o evidence of abcess/foci of infection/fasciitis *dopplers were performed but were inconclusive given patient's body habitus and pain in leg  Acute renal failure Renal fxn has fluctuated w/ diuresis - appears to be stable w/ crt at ~2.5   Hyperbilirubinemia *Not obstructive in nature and likely secondary to passive hepatic congestion   Acute gouty arthropathy *Currently responded well to steroids - now off steroids and on maintenance colchicine  Morbid obesity with BMI of 50.0-59.9, adult *May have underlying OSA/OHS *Also has striking masculine features - total testosterone level is < 10 making PCOD very unlikely  DVT prophylaxis: Subcutaneous heparin 3 times a day Code Status: Full Family Communication: Spoke to patient Disposition Plan: step down  Consultants: Heart failure team General surgery  Procedures: Skin and soft tissue biopsy of abdominal wall by Dr. Leticia Penna on 12/18/2011  Bone marrow biopsy in interventional radiology on 12/28/1998  Antibiotics: Doxycycline 10/21 >>> 10/28 Levaquin 10/28 >>>10/30 Levaquin 12/28/11 >>> Vancomycin 12/28/11 >>>  HPI/Subjective: Resting comfortably today.  Denies sob, n/v, or abdom pain.  Pain in L LE has decreased appreciably.     Objective: Blood pressure 132/66, pulse 77, temperature 98.8 F (37.1 C), temperature  source Oral, resp. rate 16, height 5\' 11"  (1.803 m), weight 152 kg (335  lb 1.6 oz), last menstrual period 12/01/2011, SpO2 97.00%.  Intake/Output Summary (Last 24 hours) at 12/29/11 1104 Last data filed at 12/29/11 1000  Gross per 24 hour  Intake   2070 ml  Output   3250 ml  Net  -1180 ml     Exam: General: No acute respiratory distress Lungs: Clear to auscultation bilaterally without wheezes or crackles - BS distant th/o  Cardiovascular: Regular rate without murmur gallop or rub normal S1 and S2 Abdomen: Nontender, nondistended, soft, bowel sounds positive, no rebound, no ascites, no appreciable mass - morbidly obese  Musculoskeletal: No significant cyanosis, clubbing of bilateral lower extremities Skin: decreased erythmea of L LE compared to yesterday - several large anterior stasis ulcers with moderate fibrin debris - no obvious fluctuance palpated and no purulent or foul smelling drainage noted Neurological: Alert and oriented x 3  Data Reviewed: Basic Metabolic Panel:  Lab 12/29/11 8469 12/28/11 0501 12/27/11 0450 12/26/11 1730 12/26/11 0450 12/25/11 0500  NA 139 138 136 -- 135 136  K 3.5 4.1 4.0 -- 3.9 4.5  CL 96 93* 93* -- 91* 92*  CO2 33* 34* 34* -- 33* 32  GLUCOSE 87 104* 102* -- 118* 83  BUN 67* 75* 79* -- 83* 81*  CREATININE 2.41* 2.48* 2.36* 2.37* 2.35* --  CALCIUM 8.8 9.0 8.9 -- 9.1 9.4  MG -- -- -- -- -- --  PHOS -- -- -- -- -- --   Liver Function Tests:  Lab 12/26/11 0450 12/24/11 0600  AST 20 24  ALT 18 22  ALKPHOS 134* 120*  BILITOT 3.1* 3.3*  PROT 7.6 7.7  ALBUMIN 2.4* 2.4*   CBC:  Lab 12/29/11 0517 12/28/11 0501 12/28/11 12/27/11 1000 12/27/11 0450 12/26/11 1730  WBC 34.9* 35.0* 33.1* -- 29.1* 30.8*  NEUTROABS -- -- -- 23.8* -- --  HGB 8.3* 8.7* 9.0* -- 7.3* 7.3*  HCT 25.0* 26.1* 26.8* -- 22.2* 22.1*  MCV 84.7 84.2 84.3 -- 83.8 84.0  PLT 221 261 252 -- 281 301    Basename 12/14/11 2102 12/11/11 1330  PROBNP 10792.0* 10471.0*      Studies:  Recent x-ray studies have been reviewed in detail by the Attending Physician  Scheduled Meds:  Reviewed in detail by the Attending Physician  Lonia Blood, MD [facility] Office  [phone] Pager [phone]  On-Call/Text Page:      [email or web address]      password [facility]  12/29/2011, 11:04 AM   LOS: 19 days

## 2011-12-29 NOTE — Progress Notes (Signed)
  Echo reviewed:  LV normal function with severe LVH (? infitrative vs HTN).  RV dilated and at least moderate dysfunction.  Bubble study +.  Based on echo amyloid remains a significant concern though could also be due to severe HTN and RH strain from OHS.   Await results of BMBx.   Brynnleigh Mcelwee,MD 3:13 PM

## 2011-12-29 NOTE — Progress Notes (Signed)
Advanced Heart Failure Rounding Note   Subjective:    41 yo with history of HTN, gout, morbid obesity, atrial fibrillation and diastolic heart failure.  Admitted to [facility] on 10/14 for massive volume overload.  Transferred to [facility] on 10/26 due to worsening renal function, weakness and volume overload.   Echo: diastolic heart failure with ejection fraction of 55-60%, severe LVH, Mildly dilated RV.  Mildly to mod dilated RA.  moderate pericardial effusion and echocardiographic changes consistent with a possible infiltrative disease.  PAPP 72 mmHg  Fat pad bx negative for amyloid.  Bone marrow aspiration and biopsy was planned for outpatient.  Cr 1.57 (on admit) now 2.48->2.41  Milrinone stopped 10/30 after RHC c/w high output HF RA = 16  RV = 81/14/20  PA = 78/32 (52)  PCW = 21  Fick cardiac output/index = 11.9/4.6  Thermo CO/CI = 9.8/3.8  PVR = 2.6 Woods (Fick)  FA sat = 91%  PA sat = 65%, 72%  SVC sat = 73%  RA sat = 66%  Yesterday underwent Bone Marrow bx. Also MRI of LE which sowed diffuse cellulitis of LLE. No osteo or abscess. Antibiotics expanded. Swan pulled for MRI. Lasix drip stopped.  C/o leg hurting. Denies dyspnea. Weight inaccurate as patient not stood up for weight. I/Os negative 2L. WBC plateuaed at 35K.   I checked CVP personally 16 (was 14 yesterday)    Objective:    Vital Signs:   Temp:  [98.1 F (36.7 C)-99.1 F (37.3 C)] 98.8 F (37.1 C) (11/02 0720) Pulse Rate:  [80-131] 105  (11/02 0720) Resp:  [13-20] 16  (11/02 0720) BP: (109-155)/(59-95) 137/75 mmHg (11/02 0720) SpO2:  [90 %-100 %] 97 % (11/02 0720) Weight:  [152 kg (335 lb 1.6 oz)] 152 kg (335 lb 1.6 oz) (11/02 0515) Last BM Date: 12/25/11  Weight change: Filed Weights   12/28/11 0500 12/28/11 0700 12/29/11 0515  Weight: 148.6 kg (327 lb 9.7 oz) 138.347 kg (305 lb) 152 kg (335 lb 1.6 oz)    Intake/Output:   Intake/Output Summary (Last 24 hours) at 12/29/11 0743 Last data filed at 12/29/11  0700  Gross per 24 hour  Intake   2050 ml  Output   4100 ml  Net  -2050 ml     Physical Exam: General:  Obese, well appearing. No resp difficulty.  HEENT: normal Neck: supple. RIJ  sheath Carotids 2+ bilat; no bruits. No lymphadenopathy or thryomegaly appreciated. Cor: PMI nondisplaced. + probable RV lift Regular rate & rhythm. No rubs, gallops or murmurs. Lungs: clear anteriorl Abdomen: obese, soft, nontender, nondistended. Good bowel sounds. Extremities: no cyanosis, clubbing, rash, minimal edema to thigh. +calor/erythema and painful to touch Cellulitis/lymphedema on L. RUE PICC. Large gouty nodule R elbow Neuro: alert & orientedx3, cranial nerves grossly intact. moves all 4 extremities w/o difficulty. Affect pleasant  Telemetry: AF 90-105  Labs: Basic Metabolic Panel:  Lab 12/29/11 0981 12/28/11 0501 12/27/11 0450 12/26/11 1730 12/26/11 0450 12/25/11 0500  NA 139 138 136 -- 135 136  K 3.5 4.1 4.0 -- 3.9 4.5  CL 96 93* 93* -- 91* 92*  CO2 33* 34* 34* -- 33* 32  GLUCOSE 87 104* 102* -- 118* 83  BUN 67* 75* 79* -- 83* 81*  CREATININE 2.41* 2.48* 2.36* 2.37* 2.35* --  CALCIUM 8.8 9.0 8.9 -- -- --  MG -- -- -- -- -- --  PHOS -- -- -- -- -- --    Liver Function Tests:  Lab 12/26/11  0450 12/24/11 0600  AST 20 24  ALT 18 22  ALKPHOS 134* 120*  BILITOT 3.1* 3.3*  PROT 7.6 7.7  ALBUMIN 2.4* 2.4*   No results found for this basename: LIPASE:5,AMYLASE:5 in the last 168 hours No results found for this basename: AMMONIA:3 in the last 168 hours  CBC:  Lab 12/29/11 0517 12/28/11 0501 12/28/11 12/27/11 1000 12/27/11 0450 12/26/11 1730  WBC 34.9* 35.0* 33.1* -- 29.1* 30.8*  NEUTROABS -- -- -- 23.8* -- --  HGB 8.3* 8.7* 9.0* -- 7.3* 7.3*  HCT 25.0* 26.1* 26.8* -- 22.2* 22.1*  MCV 84.7 84.2 84.3 -- 83.8 84.0  PLT 221 261 252 -- 281 301    Cardiac Enzymes:  Lab 12/28/11 1029  CKTOTAL 17  CKMB --  CKMBINDEX --  TROPONINI --    BNP: BNP (last 3 results)  Basename  12/14/11 2102 12/11/11 1330  PROBNP 10792.0* 10471.0*       Medications:     Scheduled Medications:    . amLODipine  10 mg Oral Daily  . aspirin  81 mg Oral Daily  . colchicine  0.6 mg Oral Daily  . fentaNYL      . folic acid  1 mg Oral Daily  . heparin  5,000 Units Subcutaneous Q8H  . labetalol  400 mg Oral BID  . levofloxacin (LEVAQUIN) IV  750 mg Intravenous Q24H  . midazolam      .  morphine injection  2 mg Intravenous Once  . pantoprazole  40 mg Oral Daily  . sodium chloride  10-40 mL Intracatheter Q12H  . sodium chloride  3 mL Intravenous Q12H  . sodium chloride  3 mL Intravenous Q12H  . vancomycin  1,250 mg Intravenous Q24H  . vancomycin  2,000 mg Intravenous Once    Infusions:    . sodium chloride Stopped (12/28/11 0900)  . sodium chloride 7 mL/hr (12/25/11 2000)  . sodium chloride 20 mL/hr at 12/27/11 1600  . furosemide (LASIX) infusion Stopped (12/28/11 0900)    PRN Medications: sodium chloride, acetaminophen, fentaNYL, hydrALAZINE, midazolam, morphine injection, ondansetron (ZOFRAN) IV, oxyCODONE-acetaminophen, promethazine, sodium chloride, sodium chloride   Assessment:   1. Acute on chronic diastolic HF 2. 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 Atrial fibrillation, chronic 4. Morbid obesity, BMI 54.8  5. HTN, uncontrolled 6. Acute on chronic renal failure (baseline Cr 1.5) 7. Cellulitis 8. Anemia  Plan/Discussion:    From a cardiac standpoint, the main issue here seems to be high output HF with high right sided pressures. She has done well with diuresis and lost 100 pounds. But CVP still elevated. Lasix stopped yesterday due to worsening renal function. Continues to diurese off lasix. However CVP up slightly. Weigh inaccurate.  Suspect infection may be driving high-output state. She is also anemic. Results of BMBx for possible amyloid pending.  Will start daily demadex and see how she tolerates. Echo with bubble study looking  for peripheral shunting done yesterday. I will review.  Prior to d/c will need to consider NOAC for anticoagulation of AF as she is not coumadin candidate due to non-compliance.  Treatment of cellulitis per primary team.   I have asked nursing to please try to stand her up for a weight tomorrow.   PT is seeing.   Length of Stay: 75 Daniel Bensimhon,MD 7:43 AM

## 2011-12-30 DIAGNOSIS — L02419 Cutaneous abscess of limb, unspecified: Secondary | ICD-10-CM | POA: Diagnosis not present

## 2011-12-30 LAB — CARBOXYHEMOGLOBIN
Carboxyhemoglobin: 2.8 % — ABNORMAL HIGH (ref 0.5–1.5)
O2 Saturation: 57.8 %
Total hemoglobin: 8.4 g/dL — ABNORMAL LOW (ref 12.0–16.0)

## 2011-12-30 LAB — BASIC METABOLIC PANEL
CO2: 32 mEq/L (ref 19–32)
Calcium: 8.3 mg/dL — ABNORMAL LOW (ref 8.4–10.5)
Creatinine, Ser: 2.37 mg/dL — ABNORMAL HIGH (ref 0.50–1.10)

## 2011-12-30 LAB — CBC
MCH: 27.7 pg (ref 26.0–34.0)
MCHC: 32.7 g/dL (ref 30.0–36.0)
MCV: 84.7 fL (ref 78.0–100.0)
Platelets: 200 10*3/uL (ref 150–400)
RBC: 3 MIL/uL — ABNORMAL LOW (ref 3.87–5.11)
RDW: 17 % — ABNORMAL HIGH (ref 11.5–15.5)

## 2011-12-30 MED ORDER — POTASSIUM CHLORIDE CRYS ER 20 MEQ PO TBCR
40.0000 meq | EXTENDED_RELEASE_TABLET | Freq: Two times a day (BID) | ORAL | Status: AC
  Administered 2011-12-30 – 2011-12-31 (×3): 40 meq via ORAL
  Filled 2011-12-30 (×3): qty 2

## 2011-12-30 NOTE — Progress Notes (Signed)
Patient examined and I agree with the assessment and plan  Violeta Gelinas, MD, MPH, FACS Pager: [phone]  12/30/2011 1:02 PM

## 2011-12-30 NOTE — Progress Notes (Signed)
Patient ID: [name], female   DOB: 07/30/1970, 41 y.o.   MRN: [id number] 4 Days Post-Op  Subjective: Pt with no new complaints  Objective: Vital signs in last 24 hours: Temp:  [98.2 F (36.8 C)-98.6 F (37 C)] 98.3 F (36.8 C) (11/03 0340) Pulse Rate:  [77-106] 106  (11/03 0800) Resp:  [18] 18  (11/02 1545) BP: (112-156)/(65-88) 152/84 mmHg (11/03 0800) SpO2:  [91 %-100 %] 100 % (11/03 0800) Weight:  [301 lb 2.4 oz (136.6 kg)] 301 lb 2.4 oz (136.6 kg) (11/03 0500) Last BM Date: 12/28/11  Intake/Output from previous day: 11/02 0701 - 11/03 0700 In: 1990 [P.O.:1480; I.V.:110; IV Piggyback:400] Out: 3000 [Urine:3000] Intake/Output this shift:    PE: Ext: left LE with erythema and several open wounds with beefy red tissue in place, chronic stasis changes, edema still present but improving  Lab Results:   Basename 12/30/11 0500 12/29/11 0517  WBC 32.5* 34.9*  HGB 8.3* 8.3*  HCT 25.4* 25.0*  PLT 200 221   BMET  Basename 12/29/11 0517 12/28/11 0501  NA 139 138  K 3.5 4.1  CL 96 93*  CO2 33* 34*  GLUCOSE 87 104*  BUN 67* 75*  CREATININE 2.41* 2.48*  CALCIUM 8.8 9.0   PT/INR  Basename 12/28/11 0501  LABPROT 18.7*  INR 1.62*   CMP     Component Value Date/Time   NA 139 12/29/2011 0517   K 3.5 12/29/2011 0517   CL 96 12/29/2011 0517   CO2 33* 12/29/2011 0517   GLUCOSE 87 12/29/2011 0517   BUN 67* 12/29/2011 0517   CREATININE 2.41* 12/29/2011 0517   CALCIUM 8.8 12/29/2011 0517   PROT 7.6 12/26/2011 0450   ALBUMIN 2.4* 12/26/2011 0450   AST 20 12/26/2011 0450   ALT 18 12/26/2011 0450   ALKPHOS 134* 12/26/2011 0450   BILITOT 3.1* 12/26/2011 0450   GFRNONAA 24* 12/29/2011 0517   GFRAA 28* 12/29/2011 0517   Lipase  No results found for this basename: lipase       Studies/Results: Mr Tibia Fibula Left Wo Contrast  12/28/2011  *RADIOLOGY REPORT*  Clinical Data: Cellulitis and soft tissue ulcerations in the left lower leg.  MRI OF THE LEFT TIBIA AND FIBULA  WITHOUT CONTRAST  Technique:  Multiplanar, multisequence MR imaging of the left tibia and fibula was performed.  No intravenous contrast was administered.  Comparison:   None  Findings:  The tibia and fibula appear normal.  The patient has extensive edema in the subcutaneous fat in the anterior and lateral aspects of the left lower leg with no defined abscess.  There is no deep fasciitis or myositis.  The patient has a small benign-appearing lipoma in the lateral compartment adjacent to the lateral aspect of the tibia measuring approximately 4 x 1 cm.  This is not felt to be significant.  IMPRESSION: Diffuse extensive cellulitis of the left lower leg without defined abscesses.  No myositis or osteomyelitis.   Original Report Authenticated By: Francene Boyers, M.D.    Ct Biopsy  12/28/2011  *RADIOLOGY REPORT*  Clinical Data:  Chronic anemia  CT GUIDED RIGHT ILIAC BONE MARROW ASPIRATION AND CORE BIOPSY  Date:  12/28/2011 11:00:00  Radiologist:  M. Ruel Favors, M.D.  Medications:  2 mg Versed, 150 mcg Fentanyl  Guidance:  CT  Sedation time:  37 minutes  Contrast volume:  None.  Complications:  No immediate  PROCEDURE/FINDINGS:  Informed consent was obtained from the patient following explanation of the procedure, risks,  benefits and alternatives. The patient understands, agrees and consents for the procedure. All questions were addressed.  A time out was performed.  The patient was positioned prone and noncontrast localization CT was performed of the pelvis to demonstrate the iliac marrow spaces.  Maximal barrier sterile technique utilized including caps, mask, sterile gowns, sterile gloves, large sterile drape, hand hygiene, and betadine prep.  Under sterile conditions and local anesthesia, an 11 gauge coaxial bone biopsy needle was advanced into the right iliac marrow space. Needle position was confirmed with CT imaging. Initially, bone marrow aspiration was performed. Next, the 11 gauge outer cannula was utilized  to obtain a right iliac bone marrow core biopsy. Needle was removed. Hemostasis was obtained with compression. The patient tolerated the procedure well. Samples were prepared with the cytotechnologist. No immediate complications.  IMPRESSION: CT guided right iliac bone marrow aspiration and core biopsy.   Original Report Authenticated By: Judie Petit. Miles Costain, M.D.     Anti-infectives: Anti-infectives     Start     Dose/Rate Route Frequency Ordered Stop   12/29/11 1000   vancomycin (VANCOCIN) 1,250 mg in sodium chloride 0.9 % 250 mL IVPB        1,250 mg 166.7 mL/hr over 90 Minutes Intravenous Every 24 hours 12/28/11 0939     12/28/11 1200   levofloxacin (LEVAQUIN) IVPB 750 mg        750 mg 100 mL/hr over 90 Minutes Intravenous Every 24 hours 12/28/11 0939     12/28/11 1030   vancomycin (VANCOCIN) 2,000 mg in sodium chloride 0.9 % 500 mL IVPB        2,000 mg 250 mL/hr over 120 Minutes Intravenous  Once 12/28/11 0939 12/28/11 1239   12/24/11 1000   levofloxacin (LEVAQUIN) IVPB 500 mg  Status:  Discontinued        500 mg 100 mL/hr over 60 Minutes Intravenous Every 24 hours 12/24/11 0940 12/24/11 0947   12/24/11 1000   levofloxacin (LEVAQUIN) IVPB 750 mg  Status:  Discontinued        750 mg 100 mL/hr over 90 Minutes Intravenous Every 24 hours 12/24/11 0947 12/26/11 1700   12/20/11 0000   doxycycline (VIBRA-TABS) 100 MG tablet        100 mg Oral Every 12 hours 12/20/11 1007     12/18/11 0600   vancomycin (VANCOCIN) 1,250 mg in sodium chloride 0.9 % 250 mL IVPB  Status:  Discontinued        1,250 mg 166.7 mL/hr over 90 Minutes Intravenous Every 24 hours 12/17/11 1416 12/17/11 1627   12/17/11 2200   doxycycline (VIBRA-TABS) tablet 100 mg  Status:  Discontinued        100 mg Oral Every 12 hours 12/17/11 1629 12/24/11 0940   12/16/11 0100   vancomycin (VANCOCIN) 1,500 mg in sodium chloride 0.9 % 500 mL IVPB  Status:  Discontinued        1,500 mg 250 mL/hr over 120 Minutes Intravenous Every 12 hours  12/15/11 1226 12/17/11 1416   12/15/11 1300   vancomycin (VANCOCIN) 2,000 mg in sodium chloride 0.9 % 500 mL IVPB        2,000 mg 250 mL/hr over 120 Minutes Intravenous  Once 12/15/11 1226 12/15/11 1659           Assessment/Plan 1. Cellulitis left leg: MR showed no drainable abscess, just diffuse cellulitis, wounds do not need debridement and would continue dressing changes, no further recs from surgical standpoint.   LOS: 20 days  Shaquan Missey 12/30/2011

## 2011-12-30 NOTE — Progress Notes (Signed)
[facility] Progress Note [place] TEAM 1 - Stepdown/ICU TEAM   [name] OZH:[id number] DOB: [birth year] DOA: 12/10/2011 PCP: [name]., MD  Brief narrative: The patient is a morbidly obese 41 yo woman with a h/o HTN, poorly controlled, who was admitted to [facility] with massive volume overload and because of being refractory to diuresis was transfered to [facility] for ongoing therapy. She has a h/o non-compliance. She has massive peripheral edema and has been obese for many years. She also has a h/o gout with flairs in the past.  Assessment/Plan:  Acute right heart failure/Anasarca/known diastolic dysfunction with chronic heart failure Care as per HF Team  ? 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 Had radiographic findings including hepatosplenomegaly concerning for gammaglobulin abnormality or dysfunction. *She is status post bone marrow biopsy in interventional radiology 12/28/11  Pulmonary HTN-severe Care as per HF Team  Anemia *Baseline hemoglobin on 12/10/2011 was 11.3 - hemoglobin had drifted down to 7.3 and after additional diuresis increased back up to 8.7 *Cardiology recommending transfusion which would help support oxygen demand in her setting of high-output cardiac heart failure - pt is now s/p 2U PRBC *Fe studies were consistent with anemia of chronic disease type picture (all indices low normal to low) *TSH only mildly elevated at 4.8  Accelerated hypertension BP control has been variable - follow w/o change today - No ACE inhibitor or ARB secondary to ongoing renal failure  Atrial fibrillation with controlled ventricular response *Has significant left atrial enlargement of unknown duration *After adequately diuresed consideration can be given to TEE/cardioversion- her extent of compliance with  anticoagulation will need to be discussed with her prior to making a decision.   Cellulitis of left leg/Fever/ Leukocytosis *Doxy switched to Levaquin 10/28 - Levaquin discontinued 12/26/2011 because felt that the lower extremity findings were more related to chronic venous stasis than a true infection and plans were to follow clinically (was not erythematous at that time). Over the subsequent 48 hours patient had increased cellulitic skin changes extending up to the left lateral thigh therefore Levaquin was resumed and vancomycin added *MRI of left leg w/o evidence of abcess/foci of infection/fasciitis *dopplers were performed but were inconclusive given patient's body habitus and pain in leg *exam suggests that the erythema is resolving today   Acute renal failure Renal fxn has fluctuated w/ diuresis - appears to be stable w/ crt at ~2.5   Hyperbilirubinemia *Not obstructive in nature and likely secondary to passive hepatic congestion   Acute gouty arthropathy *responded well to steroids - now off steroids and on maintenance colchicine w/o flare  Morbid obesity with BMI of 50.0-59.9, adult *May have underlying OSA/OHS *Also has striking masculine features - total testosterone level is < 10 making PCOD very unlikely  DVT prophylaxis: Subcutaneous heparin 3 times a day Code Status: Full Family Communication: Spoke to patient Disposition Plan: step down  Consultants: Heart Failure Team General Surgery  Procedures: Skin and soft tissue biopsy of abdominal wall by Dr. Leticia Penna on 12/18/2011  Bone marrow biopsy in interventional radiology on 12/28/1998  Antibiotics: Doxycycline 10/21 >>> 10/28 Levaquin 10/28 >>>10/30 Levaquin 12/28/11 >>> Vancomycin 12/28/11 >>>  HPI/Subjective: Resting comfortably today.  Denies sob, n/v, or abdom pain.  Pain in L LE remains well controlled at this time.   Objective: Blood pressure 152/84, pulse 106, temperature 98.3 F (36.8 C), temperature  source Oral, resp.  rate 18, height 5\' 11"  (1.803 m), weight 136.6 kg (301 lb 2.4 oz), last menstrual period 12/01/2011, SpO2 100.00%.  Intake/Output Summary (Last 24 hours) at 12/30/11 0912 Last data filed at 12/30/11 9562  Gross per 24 hour  Intake   1990 ml  Output   3000 ml  Net  -1010 ml     Exam: General: No acute respiratory distress Lungs: Clear to auscultation bilaterally without wheezes or crackles - BS distant th/o  Cardiovascular: Regular rate without murmur gallop or rub  Abdomen: Nontender, nondistended, soft, bowel sounds positive, no rebound, no ascites, no appreciable mass - morbidly obese  Musculoskeletal: No significant cyanosis, clubbing of bilateral lower extremities Skin: decreased erythmea of L LE compared to yesterday again - several large anterior stasis ulcers with moderate fibrin debris - no obvious fluctuance palpated and no purulent or foul smelling drainage noted Neurological: Alert and oriented x 3  Data Reviewed: Basic Metabolic Panel:  Lab 12/29/11 1308 12/28/11 0501 12/27/11 0450 12/26/11 1730 12/26/11 0450 12/25/11 0500  NA 139 138 136 -- 135 136  K 3.5 4.1 4.0 -- 3.9 4.5  CL 96 93* 93* -- 91* 92*  CO2 33* 34* 34* -- 33* 32  GLUCOSE 87 104* 102* -- 118* 83  BUN 67* 75* 79* -- 83* 81*  CREATININE 2.41* 2.48* 2.36* 2.37* 2.35* --  CALCIUM 8.8 9.0 8.9 -- 9.1 9.4  MG -- -- -- -- -- --  PHOS -- -- -- -- -- --   Liver Function Tests:  Lab 12/26/11 0450 12/24/11 0600  AST 20 24  ALT 18 22  ALKPHOS 134* 120*  BILITOT 3.1* 3.3*  PROT 7.6 7.7  ALBUMIN 2.4* 2.4*   CBC:  Lab 12/30/11 0500 12/29/11 0517 12/28/11 0501 12/28/11 12/27/11 1000 12/27/11 0450  WBC 32.5* 34.9* 35.0* 33.1* -- 29.1*  NEUTROABS -- -- -- -- 23.8* --  HGB 8.3* 8.3* 8.7* 9.0* -- 7.3*  HCT 25.4* 25.0* 26.1* 26.8* -- 22.2*  MCV 84.7 84.7 84.2 84.3 -- 83.8  PLT 200 221 261 252 -- 281    Basename 12/14/11 2102 12/11/11 1330  PROBNP 10792.0* 10471.0*      Studies:  Recent x-ray studies have been reviewed in detail by the Attending Physician  Scheduled Meds:  Reviewed in detail by the Attending Physician  Lonia Blood, MD [facility] Office  [phone] Pager [phone]  On-Call/Text Page:      [email or web address]      password [facility]  12/30/2011, 9:12 AM   LOS: 20 days

## 2011-12-30 NOTE — Progress Notes (Signed)
Advanced Heart Failure Rounding Note   Subjective:    41 yo with history of HTN, gout, morbid obesity, atrial fibrillation and diastolic heart failure.  Admitted to [facility] on 10/14 for massive volume overload.  Transferred to [facility] on 10/26 due to worsening renal function, weakness and volume overload.   Echo: diastolic heart failure with ejection fraction of 55-60%, severe LVH, Mildly dilated RV.  Mildly to mod dilated RA.  moderate pericardial effusion and echocardiographic changes consistent with a possible infiltrative disease.  PAPP 72 mmHg  Fat pad bx negative for amyloid.  Bone marrow aspiration and biopsy was planned for outpatient.  Cr 1.57 (on admit) now 2.48->2.41->2.37  Milrinone stopped 10/30 after RHC c/w high output HF RA = 16  RV = 81/14/20  PA = 78/32 (52)  PCW = 21  Fick cardiac output/index = 11.9/4.6  Thermo CO/CI = 9.8/3.8  PVR = 2.6 Woods (Fick)  FA sat = 91%  PA sat = 65%, 72%  SVC sat = 73%  RA sat = 66%  Underwent Bone Marrow bx. Also MRI of LE which sowed diffuse cellulitis of LLE. No osteo or abscess. Antibiotics expanded.   LV normal function with severe LVH (? infitrative vs HTN).  RV dilated and at least moderate dysfunction.  Bubble study +.  Feeling better. Weight now down over 100 pounds. WBC drifting down.   Co-ox 58%    I checked CVP personally 16 (was 14 yesterday)    Objective:    Vital Signs:   Temp:  [98.2 F (36.8 C)-98.6 F (37 C)] 98.3 F (36.8 C) (11/03 0340) Pulse Rate:  [78-106] 106  (11/03 0800) Resp:  [18] 18  (11/02 1545) BP: (120-156)/(65-88) 152/84 mmHg (11/03 0800) SpO2:  [91 %-100 %] 100 % (11/03 0800) Weight:  [136.6 kg (301 lb 2.4 oz)] 136.6 kg (301 lb 2.4 oz) (11/03 0500) Last BM Date: 12/28/11  Weight change: Filed Weights   12/28/11 0700 12/29/11 0515 12/30/11 0500  Weight: 138.347 kg (305 lb) 152 kg (335 lb 1.6 oz) 136.6 kg (301 lb 2.4 oz)    Intake/Output:   Intake/Output Summary (Last 24 hours) at  12/30/11 1024 Last data filed at 12/30/11 0839  Gross per 24 hour  Intake   1750 ml  Output   2550 ml  Net   -800 ml     Physical Exam: General:  Obese, well appearing. No resp difficulty.  HEENT: normal Neck: supple. RIJ  sheath Carotids 2+ bilat; no bruits. No lymphadenopathy or thryomegaly appreciated. Cor: PMI nondisplaced. + probable RV lift Regular rate & rhythm. No rubs, gallops or murmurs. Lungs: clear anteriorl Abdomen: obese, soft, nontender, nondistended. Good bowel sounds. Extremities: no cyanosis, clubbing, rash, minimal edema to thigh. +calor/erythema and painful to touch Cellulitis/lymphedema on L. RUE PICC. Large gouty nodule R elbow Neuro: alert & orientedx3, cranial nerves grossly intact. moves all 4 extremities w/o difficulty. Affect pleasant  Telemetry: AF 90-105  Labs: Basic Metabolic Panel:  Lab 12/30/11 5784 12/29/11 0517 12/28/11 0501 12/27/11 0450 12/26/11 1730 12/26/11 0450  NA 140 139 138 136 -- 135  K 3.3* 3.5 4.1 4.0 -- 3.9  CL 98 96 93* 93* -- 91*  CO2 32 33* 34* 34* -- 33*  GLUCOSE 111* 87 104* 102* -- 118*  BUN 57* 67* 75* 79* -- 83*  CREATININE 2.37* 2.41* 2.48* 2.36* 2.37* --  CALCIUM 8.3* 8.8 9.0 -- -- --  MG -- -- -- -- -- --  PHOS -- -- -- -- -- --  Liver Function Tests:  Lab 12/26/11 0450 12/24/11 0600  AST 20 24  ALT 18 22  ALKPHOS 134* 120*  BILITOT 3.1* 3.3*  PROT 7.6 7.7  ALBUMIN 2.4* 2.4*   No results found for this basename: LIPASE:5,AMYLASE:5 in the last 168 hours No results found for this basename: AMMONIA:3 in the last 168 hours  CBC:  Lab 12/30/11 0500 12/29/11 0517 12/28/11 0501 12/28/11 12/27/11 1000 12/27/11 0450  WBC 32.5* 34.9* 35.0* 33.1* -- 29.1*  NEUTROABS -- -- -- -- 23.8* --  HGB 8.3* 8.3* 8.7* 9.0* -- 7.3*  HCT 25.4* 25.0* 26.1* 26.8* -- 22.2*  MCV 84.7 84.7 84.2 84.3 -- 83.8  PLT 200 221 261 252 -- 281    Cardiac Enzymes:  Lab 12/28/11 1029  CKTOTAL 17  CKMB --  CKMBINDEX --  TROPONINI --      BNP: BNP (last 3 results)  Basename 12/14/11 2102 12/11/11 1330  PROBNP 10792.0* 10471.0*       Medications:     Scheduled Medications:    . amLODipine  10 mg Oral Daily  . aspirin  81 mg Oral Daily  . colchicine  0.6 mg Oral Daily  . folic acid  1 mg Oral Daily  . heparin  5,000 Units Subcutaneous Q8H  . labetalol  400 mg Oral BID  . levofloxacin (LEVAQUIN) IV  750 mg Intravenous Q24H  . pantoprazole  40 mg Oral Daily  . sodium chloride  10-40 mL Intracatheter Q12H  . torsemide  40 mg Oral Daily  . vancomycin  1,250 mg Intravenous Q24H    Infusions:    . sodium chloride 7 mL/hr (12/25/11 2000)  . sodium chloride 20 mL/hr at 12/27/11 1600    PRN Medications: sodium chloride, acetaminophen, hydrALAZINE, morphine injection, ondansetron (ZOFRAN) IV, oxyCODONE-acetaminophen, promethazine, sodium chloride, [DISCONTINUED] oxyCODONE-acetaminophen   Assessment:   1. Acute on chronic diastolic HF 2. 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 Atrial fibrillation, chronic 4. Morbid obesity, BMI 54.8  5. HTN, uncontrolled 6. Acute on chronic renal failure (baseline Cr 1.5) 7. Cellulitis 8. Anemia  Plan/Discussion:    Based on her hemodynamics, the main issue here seems to be high output HF with high right sided pressures. However echo with significant diastolic dysfunction and RV dysfunction as well. She has done well with diuresis and lost 100 pounds. But CVP still elevated. Doing well on oral torsemide.   Results of BMBx for possible amyloid pending.  Echo with bubble study is early +.  Suspect PFO. Will likely need TEE prior to d/c.  Also need to consider NOAC for anticoagulation of AF as she is not coumadin candidate due to non-compliance.  Treatment of cellulitis per primary team.   PT is seeing.   Length of Stay: 20 Daniel Bensimhon,MD 10:24 AM

## 2011-12-31 LAB — BASIC METABOLIC PANEL
Calcium: 8.8 mg/dL (ref 8.4–10.5)
GFR calc non Af Amer: 25 mL/min — ABNORMAL LOW (ref >90)
Glucose, Bld: 90 mg/dL (ref 70–99)
Sodium: 138 mEq/L (ref 135–145)

## 2011-12-31 LAB — CARBOXYHEMOGLOBIN
Carboxyhemoglobin: 2.9 % — ABNORMAL HIGH (ref 0.5–1.5)
Methemoglobin: 1.2 % (ref 0.0–1.5)
O2 Saturation: 98.4 %
O2 Saturation: 87.1 %

## 2011-12-31 MED ORDER — RIVAROXABAN 20 MG PO TABS
20.0000 mg | ORAL_TABLET | Freq: Every day | ORAL | Status: DC
  Administered 2011-12-31 – 2012-01-01 (×2): 20 mg via ORAL
  Filled 2011-12-31 (×3): qty 1

## 2011-12-31 NOTE — Progress Notes (Signed)
ANTIBIOTIC CONSULT NOTE - FOLLOW UP  Pharmacy Consult for Vancomycin + Levaquin Indication: LLE cellulitis  Allergies  Allergen Reactions  . Penicillins Hives    Childhood allergy    Patient Measurements: Height: 5\' 11"  (180.3 cm) Weight: 300 lb 11.3 oz (136.4 kg) IBW/kg (Calculated) : 70.8   Vital Signs: Temp: 98 F (36.7 C) (11/04 0739) Temp src: Oral (11/04 0739) BP: 138/55 mmHg (11/04 0740) Pulse Rate: 104  (11/04 0500) Intake/Output from previous day: 11/03 0701 - 11/04 0700 In: 1220 [P.O.:1080; I.V.:140] Out: 2951 [Urine:2950; Stool:1] Intake/Output from this shift: Total I/O In: 10 [I.V.:10] Out: -   Labs:  Basename 12/31/11 0500 12/30/11 0915 12/30/11 0500 12/29/11 0517  WBC -- -- 32.5* 34.9*  HGB -- -- 8.3* 8.3*  PLT -- -- 200 221  LABCREA -- -- -- --  CREATININE 2.35* 2.37* -- 2.41*   Estimated Creatinine Clearance: 49.2 ml/min (by C-G formula based on Cr of 2.35).  Assessment: Doxycycline 10/21>>10/28 Vancomycin 10/19>>10/21, 11/1>> IV Levaquin 10/28>> 10/30, 11/1>> 10/19 - blood cx x 2 - no growth, final 10/27 - MRSA screen neg 11/1 MRI - diffuse leg cellulitis  40yof continues on vancomycin and levaquin for LLE cellulitis. Remains in acute renal failure, although serum creatinine has been trending down slightly over the last several days. UOP ~ 0.9cc/kg/hr - on torsemide 40 daily. Vancomycin is now at steady state so will get trough 11/5 as dose has already been given today. Levaquin dose appropriate.  Goal of Therapy:  Vancomycin trough level 10-15 mcg/ml Appropriate Levaquin dosing  Plan:  1) Continue vancomycin 1250mg  IV q24 2) Vancomycin trough 11/15 @ 0930 3) Continue levaquin 750mg  IV q24  Fredrik Rigger 12/31/2011,11:08 AM

## 2011-12-31 NOTE — Progress Notes (Signed)
Advanced Heart Failure Rounding Note   Subjective:    41 yo with history of HTN, gout, morbid obesity, atrial fibrillation and diastolic heart failure.  Admitted to [facility] on 10/14 for massive volume overload.  Transferred to [facility] on 10/26 due to worsening renal function, weakness and volume overload.   Echo: diastolic heart failure with ejection fraction of 55-60%, severe LVH, Mildly dilated RV.  Mildly to mod dilated RA.  moderate pericardial effusion and echocardiographic changes consistent with a possible infiltrative disease.  PAPP 72 mmHg  Fat pad bx negative for amyloid.  Bone marrow aspiration and biopsy was planned for outpatient.  Cr 1.57 (on admit) now 2.48->2.41->2.37>2.35  Milrinone stopped 10/30 after RHC c/w high output HF RA = 16  RV = 81/14/20  PA = 78/32 (52)  PCW = 21  Fick cardiac output/index = 11.9/4.6  Thermo CO/CI = 9.8/3.8  PVR = 2.6 Woods (Fick)  FA sat = 91%  PA sat = 65%, 72%  SVC sat = 73%  RA sat = 66%  Underwent Bone Marrow bx. Also MRI of LE which showed diffuse cellulitis of LLE. No osteo or abscess. Antibiotics expanded.   LV normal function with severe LVH (? infiltrative) e vs HTN).  RV dilated and at least moderate dysfunction.  Bubble study +.  24 hour I/O -1.5 liters on po demadex.  Weight down total of 105 pounds.   CVP 13-14 (checked personally) CO-OX pending  Feels better. Complains of L leg pain. Denies SOB/PND   Objective:    Vital Signs:   Temp:  [98 F (36.7 C)-99.8 F (37.7 C)] 98 F (36.7 C) (11/04 0739) Pulse Rate:  [75-104] 104  (11/04 0500) BP: (116-149)/(55-103) 138/55 mmHg (11/04 0740) SpO2:  [89 %-100 %] 94 % (11/04 0739) Weight:  [136.4 kg (300 lb 11.3 oz)] 136.4 kg (300 lb 11.3 oz) (11/04 0600) Last BM Date: 12/28/11  Weight change: Filed Weights   12/29/11 0515 12/30/11 0500 12/31/11 0600  Weight: 152 kg (335 lb 1.6 oz) 136.6 kg (301 lb 2.4 oz) 136.4 kg (300 lb 11.3 oz)    Intake/Output:   Intake/Output  Summary (Last 24 hours) at 12/31/11 0846 Last data filed at 12/31/11 0800  Gross per 24 hour  Intake    860 ml  Output   2951 ml  Net  -2091 ml     Physical Exam: General:  Obese, well appearing. No resp difficulty. In chair HEENT: normal Neck: supple. RIJ  sheath Carotids 2+ bilat; no bruits. No lymphadenopathy or thryomegaly appreciated. Cor: PMI nondisplaced. + probable RV lift Regular rate & rhythm. No rubs, gallops or murmurs. Lungs: clear anteriorl Abdomen: obese, soft, nontender, nondistended. Good bowel sounds. Extremities: no cyanosis, clubbing, rash, minimal edema to thigh. +calor/erythema and painful to touch Cellulitis/lymphedema on L. RUE PICC. Large gouty nodule R elbow Neuro: alert & orientedx3, cranial nerves grossly intact. moves all 4 extremities w/o difficulty. Affect pleasant  Telemetry: AF 90-105  Labs: Basic Metabolic Panel:  Lab 12/31/11 1610 12/30/11 0915 12/29/11 0517 12/28/11 0501 12/27/11 0450  NA 138 140 139 138 136  K 3.9 3.3* 3.5 4.1 4.0  CL 98 98 96 93* 93*  CO2 30 32 33* 34* 34*  GLUCOSE 90 111* 87 104* 102*  BUN 54* 57* 67* 75* 79*  CREATININE 2.35* 2.37* 2.41* 2.48* 2.36*  CALCIUM 8.8 8.3* 8.8 -- --  MG -- -- -- -- --  PHOS -- -- -- -- --    Liver Function Tests:  Lab 12/26/11 0450  AST 20  ALT 18  ALKPHOS 134*  BILITOT 3.1*  PROT 7.6  ALBUMIN 2.4*   No results found for this basename: LIPASE:5,AMYLASE:5 in the last 168 hours No results found for this basename: AMMONIA:3 in the last 168 hours  CBC:  Lab 12/30/11 0500 12/29/11 0517 12/28/11 0501 12/28/11 12/27/11 1000 12/27/11 0450  WBC 32.5* 34.9* 35.0* 33.1* -- 29.1*  NEUTROABS -- -- -- -- 23.8* --  HGB 8.3* 8.3* 8.7* 9.0* -- 7.3*  HCT 25.4* 25.0* 26.1* 26.8* -- 22.2*  MCV 84.7 84.7 84.2 84.3 -- 83.8  PLT 200 221 261 252 -- 281    Cardiac Enzymes:  Lab 12/28/11 1029  CKTOTAL 17  CKMB --  CKMBINDEX --  TROPONINI --    BNP: BNP (last 3 results)  Basename  12/14/11 2102 12/11/11 1330  PROBNP 10792.0* 10471.0*       Medications:     Scheduled Medications:    . amLODipine  10 mg Oral Daily  . aspirin  81 mg Oral Daily  . colchicine  0.6 mg Oral Daily  . folic acid  1 mg Oral Daily  . heparin  5,000 Units Subcutaneous Q8H  . labetalol  400 mg Oral BID  . levofloxacin (LEVAQUIN) IV  750 mg Intravenous Q24H  . pantoprazole  40 mg Oral Daily  . potassium chloride  40 mEq Oral BID  . sodium chloride  10-40 mL Intracatheter Q12H  . torsemide  40 mg Oral Daily  . vancomycin  1,250 mg Intravenous Q24H    Infusions:    . sodium chloride 7 mL/hr (12/25/11 2000)  . sodium chloride 20 mL/hr at 12/27/11 1600    PRN Medications: sodium chloride, acetaminophen, hydrALAZINE, morphine injection, ondansetron (ZOFRAN) IV, oxyCODONE-acetaminophen, promethazine, sodium chloride   Assessment:   1. Acute on chronic diastolic HF 2. 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 Atrial fibrillation, chronic 4. Morbid obesity, BMI 54.8  5. HTN, uncontrolled 6. Acute on chronic renal failure (baseline Cr 1.5) 7. Cellulitis 8. Anemia  Plan/Discussion:    Volume status continues to improve. CVP 13-14. Continue Torsemide 40 mg daily. Continue to follow renal function. This may be her new baseline.   Results of BMBx for possible amyloid pending.  Echo with bubble study is early +.  Suspect PFO. Will likely need TEE prior to d/c. I think it may be reasonable to consider DC-CV at the same time to see if we can get her out of AF. Will start Xarelto today. She will need at least 1 month of therapy after DC-CV. I would not do DC-CV until we decide completely if she needs renal biopsy or not to further assess for amyloid. (awaiting results of BMBx)  Treatment of cellulitis per primary team.   PT is seeing.   Length of Stay: 43 Daniel Bensimhon,MD 8:47 AM

## 2011-12-31 NOTE — Progress Notes (Signed)
Occupational Therapy Treatment Patient Details Name: [name] MRN: [id number] DOB: [birth year] Today's Date: 12/31/2011 Time: 4098-1191 OT Time Calculation (min): 20 min  OT Assessment / Plan / Recommendation Comments on Treatment Session Pt. refusing to attempt standing activities.  Pt. did perform minimal exercise despite max encouragement and explanation regarding need.    Follow Up Recommendations  Home health OT    Barriers to Discharge       Equipment Recommendations  3 in 1 bedside comode    Recommendations for Other Services    Frequency Min 2X/week   Plan Discharge plan remains appropriate    Precautions / Restrictions Precautions Precautions: Fall Restrictions Weight Bearing Restrictions: No LLE Weight Bearing: Weight bearing as tolerated   Pertinent Vitals/Pain     ADL  ADL Comments: Pt. sitting up in chair with visitor present.  Attempted to convince pt. to attempt standing with OT/PT.  Pt. did agree to minimal LE exercises, and performed 10 reps chair push ups.  Instructed pt to perform these 10 reps 3-4 times a day.  Pt. agreed.    OT Diagnosis:    OT Problem List:   OT Treatment Interventions:     OT Goals ADL Goals ADL Goal: Grooming - Progress: Not progressing ADL Goal: Lower Body Bathing - Progress: Not progressing ADL Goal: Lower Body Dressing - Progress: Not progressing ADL Goal: Tub/Shower Transfer - Progress: Not progressing ADL Goal: Additional Goal #1 - Progress: Not progressing  Visit Information  Last OT Received On: 12/31/11 Assistance Needed: +2    Subjective Data      Prior Functioning       Cognition  Overall Cognitive Status: Appears within functional limits for tasks assessed/performed Arousal/Alertness: Awake/alert Orientation Level: Oriented X4 / Intact Behavior During Session: [facility] for tasks performed    Mobility  Shoulder Instructions         Exercises  General Exercises - Upper Extremity Chair Push Up:  Strengthening;10 reps;Seated   Balance     End of Session OT - End of Session Activity Tolerance: Patient limited by fatigue Patient left: in chair;with call bell/phone within reach;with family/visitor present Nurse Communication: Patient requests pain meds  GO     Ferne Ellingwood, Ursula Alert M 12/31/2011, 2:33 PM

## 2011-12-31 NOTE — Progress Notes (Signed)
[facility] Progress Note [place] TEAM 1 - Stepdown/ICU TEAM   [name] WUJ:[id number] DOB: 1970-12-15 DOA: 12/10/2011 PCP: [name]., MD  Brief narrative: The patient is a morbidly obese 41 yo woman with a h/o HTN, poorly controlled, who was admitted to [facility] with massive volume overload and because of being refractory to diuresis was transfered to [facility] for ongoing therapy. She has a h/o non-compliance. She has massive peripheral edema and has been obese for many years. She also has a h/o gout with flairs in the past.  Assessment/Plan:  Acute right heart failure/Anasarca/known diastolic dysfunction with chronic heart failure Care as per HF Team  ? 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 Had radiographic findings including hepatosplenomegaly concerning for gammaglobulin abnormality or dysfunction. *She is status post bone marrow biopsy in interventional radiology 12/28/11-pathology is still pending  Pulmonary HTN-severe Care as per HF Team  Early + echocardiogram bubble study  Care as per HF Team - plan to pursue TEE  Anemia *Baseline hemoglobin on 12/10/2011 was 11.3 - hemoglobin had drifted down to 7.3 and after additional diuresis increased back up to 8.7 *Cardiology recommending transfusion which would help support oxygen demand in her setting of high-output cardiac heart failure - pt is now s/p 2U PRBC *Fe studies were consistent with anemia of chronic disease type picture (all indices low normal to low) *TSH only mildly elevated at 4.8  Accelerated hypertension BP control has been variable - follow w/o change today - No ACE inhibitor or ARB secondary to ongoing renal failure  Atrial fibrillation with controlled ventricular response *Has significant left atrial enlargement of unknown duration  also has suspected PFO based on positive bubble study  *Suspected will need TEE prior to discharge - at that time it may be reasonable to consider DCCV concurrently to see if can convert from atrial fibrillation.  *Cardiology starting Xarelto 12/31/2011 *Deferring cardioversion until can decide completely if renal biopsy is recommended to assess for amyloid  Cellulitis of left leg/Fever/ Leukocytosis *Doxy switched to Levaquin 10/28 - Levaquin discontinued 12/26/2011 because felt that the lower extremity findings were more related to chronic venous stasis than a true infection and plans were to follow clinically (was not erythematous at that time). Over the subsequent 48 hours patient had increased cellulitic skin changes extending up to the left lateral thigh therefore Levaquin was resumed and vancomycin added *MRI of left leg w/o evidence of abcess/foci of infection/fasciitis *dopplers were performed but were inconclusive given patient's body habitus and pain in leg *exam suggests that the erythema is resolving today -superficial skin is sloughing off with wound care but otherwise wound appears to be slowly improving *White count is very slowly decreasing and she is not experiencing any fevers  Acute renal failure Renal fxn has fluctuated w/ diuresis - appears to be stable w/ crt at ~2.5   Hyperbilirubinemia *Not obstructive in nature and likely secondary to passive hepatic congestion   Acute gouty arthropathy *responded well to steroids - now off steroids and on maintenance colchicine w/o flare  Morbid obesity with BMI of 50.0-59.9, adult *May have underlying OSA/OHS *Also has striking masculine features - total testosterone level is < 10 making PCOD very unlikely  DVT prophylaxis: Subcutaneous heparin 3 times a day Code Status: Full Family Communication: Spoke to patient Disposition Plan: step down  Consultants: Heart Failure Team General Surgery - signed off  Procedures: Skin  and soft tissue biopsy of abdominal wall by Dr. Leticia Penna on 12/18/2011  Bone marrow biopsy in interventional radiology on 12/28/1998  Antibiotics: Doxycycline 10/21 >>> 10/28 Levaquin 10/28 >>>10/30 Levaquin 12/28/11 >>> Vancomycin 12/28/11 >>>  HPI/Subjective: Resting comfortably today.  Denies sob, n/v, or abdom pain.  Pain in L LE remains well controlled at this time.   Objective: Blood pressure 138/55, pulse 104, temperature 98 F (36.7 C), temperature source Oral, resp. rate 18, height 5\' 11"  (1.803 m), weight 136.4 kg (300 lb 11.3 oz), last menstrual period 12/01/2011, SpO2 94.00%.  Intake/Output Summary (Last 24 hours) at 12/31/11 1125 Last data filed at 12/31/11 0800  Gross per 24 hour  Intake    860 ml  Output   2951 ml  Net  -2091 ml     Exam: General: No acute respiratory distress Lungs: Clear to auscultation bilaterally without wheezes or crackles - BS distant th/o  Cardiovascular: Regular rate without murmur gallop or rub  Abdomen: Nontender, nondistended, soft, bowel sounds positive, no rebound, no ascites, no appreciable mass - morbidly obese  Musculoskeletal: No significant cyanosis, clubbing of bilateral lower extremities Skin: decreased erythmea of L LE - several large anterior stasis ulcers with moderate fibrin debris - no obvious fluctuance palpated and no purulent or foul smelling drainage noted-superficial skin sloughing is noted Neurological: Alert and oriented x 3  Data Reviewed: Basic Metabolic Panel:  Lab 12/31/11 1610 12/30/11 0915 12/29/11 0517 12/28/11 0501 12/27/11 0450  NA 138 140 139 138 136  K 3.9 3.3* 3.5 4.1 4.0  CL 98 98 96 93* 93*  CO2 30 32 33* 34* 34*  GLUCOSE 90 111* 87 104* 102*  BUN 54* 57* 67* 75* 79*  CREATININE 2.35* 2.37* 2.41* 2.48* 2.36*  CALCIUM 8.8 8.3* 8.8 9.0 8.9  MG -- -- -- -- --  PHOS -- -- -- -- --   Liver Function Tests:  Lab 12/26/11 0450  AST 20  ALT 18  ALKPHOS 134*  BILITOT 3.1*  PROT 7.6  ALBUMIN  2.4*   CBC:  Lab 12/30/11 0500 12/29/11 0517 12/28/11 0501 12/28/11 12/27/11 1000 12/27/11 0450  WBC 32.5* 34.9* 35.0* 33.1* -- 29.1*  NEUTROABS -- -- -- -- 23.8* --  HGB 8.3* 8.3* 8.7* 9.0* -- 7.3*  HCT 25.4* 25.0* 26.1* 26.8* -- 22.2*  MCV 84.7 84.7 84.2 84.3 -- 83.8  PLT 200 221 261 252 -- 281    Basename 12/14/11 2102 12/11/11 1330  PROBNP 10792.0* 10471.0*     Studies:  Recent x-ray studies have been reviewed in detail by the Attending Physician  Scheduled Meds:  Reviewed in detail by the Attending Physician  Junious Silk, ANP [facility] Office  [phone] Pager [phone]  On-Call/Text Page:      [email or web address]      password [facility]  12/31/2011, 11:25 AM   LOS: 21 days   I have personally examined this patient and reviewed the entire database. I have reviewed the above note, made any necessary editorial changes, and agree with its content.  Lonia Blood, MD [facility]

## 2011-12-31 NOTE — Progress Notes (Signed)
Physical Therapy Treatment Patient Details Name: [name] MRN: [id number] DOB: December 09, 1970 Today's Date: 12/31/2011 Time: 4098-1191 PT Time Calculation (min): 20 min  PT Assessment / Plan / Recommendation Comments on Treatment Session  Pt very limited by pain today. Only agreeable to exercises. Educated pt on HEP and plans to attempt standing with ambulation tomorrow morning.     Follow Up Recommendations  Home health PT;Supervision/Assistance - 24 hour     Does the patient have the potential to tolerate intense rehabilitation     Barriers to Discharge        Equipment Recommendations  Rolling walker with 5" wheels (possibly w/c, will continue to assess)    Recommendations for Other Services    Frequency     Plan Discharge plan remains appropriate;Frequency remains appropriate    Precautions / Restrictions Precautions Precautions: Fall Restrictions Weight Bearing Restrictions: No LLE Weight Bearing: Weight bearing as tolerated   Pertinent Vitals/Pain Crying with minimal movement of her LLE so limited therapy. RN premedicated pt and gave IV morphine during session.     Mobility  Bed Mobility Bed Mobility: Not assessed Sitting - Scoot to Edge of Bed: Not tested (comment) Transfers Transfers: Not assessed Details for Transfer Assistance: pt initiated sit<>stand with arms on armrest, as soon as his leg descended into dependent position this pt unable to tolerate this position and insisted that we put her leg back up, pt able tpush with arms to clear bottom from bed x5 for pre-lift off however unable to proceed because of pain Ambulation/Gait Ambulation/Gait Assistance: Not tested (comment)    Exercises General Exercises - Upper Extremity Chair Push Up: Strengthening;10 reps;Seated General Exercises - Lower Extremity Ankle Circles/Pumps: AROM;Both;10 reps;Supine Quad Sets: AROM;Both;10 reps;Supine Short Arc Quad: AAROM;Right;10 reps;Seated (pt unable to tolerate leg  down so simulated SAQ with AA)    PT Goals Acute Rehab PT Goals PT Goal: Supine/Side to Sit - Progress: Not progressing PT Goal: Sit to Supine/Side - Progress: Not progressing PT Goal: Sit to Stand - Progress: Not progressing PT Transfer Goal: Bed to Chair/Chair to Bed - Progress: Not progressing PT Goal: Ambulate - Progress: Not progressing PT Goal: Up/Down Stairs - Progress: Not progressing Pt will Perform Home Exercise Program: Independently PT Goal: Perform Home Exercise Program - Progress: Goal set today  Visit Information  Last PT Received On: 12/31/11 Assistance Needed: +2    Subjective Data  Subjective: You just don't understand the pain that I feel. I just can't get up. I've already gotten up to the chair that is all that I can do.  Patient Stated Goal: to not be painful   Cognition  Overall Cognitive Status: Appears within functional limits for tasks assessed/performed Arousal/Alertness: Awake/alert Orientation Level: Appears intact for tasks assessed Behavior During Session: Anxious Cognition - Other Comments: poor tolerance for pain, question if anxiety or emotional stress heightening pain? question pt's awareness    Balance     End of Session PT - End of Session Activity Tolerance: Patient limited by pain (although premedicated and given IV morphine during session) Patient left: in chair Nurse Communication: Mobility status   GP     [facility] [name] 12/31/2011, 3:25 PM

## 2012-01-01 LAB — CARBOXYHEMOGLOBIN
Carboxyhemoglobin: 2.9 % — ABNORMAL HIGH (ref 0.5–1.5)
Methemoglobin: 1.3 % (ref 0.0–1.5)
Methemoglobin: 1.3 % (ref 0.0–1.5)
O2 Saturation: 56.6 %
Total hemoglobin: 7.5 g/dL — ABNORMAL LOW (ref 12.0–16.0)

## 2012-01-01 LAB — CBC
HCT: 23.1 % — ABNORMAL LOW (ref 36.0–46.0)
Hemoglobin: 7.5 g/dL — ABNORMAL LOW (ref 12.0–15.0)
MCV: 86.2 fL (ref 78.0–100.0)
RBC: 2.68 MIL/uL — ABNORMAL LOW (ref 3.87–5.11)
WBC: 27.4 10*3/uL — ABNORMAL HIGH (ref 4.0–10.5)

## 2012-01-01 LAB — BASIC METABOLIC PANEL
Calcium: 8.6 mg/dL (ref 8.4–10.5)
Creatinine, Ser: 2.51 mg/dL — ABNORMAL HIGH (ref 0.50–1.10)
Glucose, Bld: 87 mg/dL (ref 70–99)
Potassium: 4 mEq/L (ref 3.5–5.1)
Sodium: 136 mEq/L (ref 135–145)

## 2012-01-01 MED ORDER — ALLOPURINOL 100 MG PO TABS
100.0000 mg | ORAL_TABLET | Freq: Every day | ORAL | Status: DC
  Administered 2012-01-01 – 2012-01-04 (×4): 100 mg via ORAL
  Filled 2012-01-01 (×4): qty 1

## 2012-01-01 MED ORDER — SILVER SULFADIAZINE 1 % EX CREA
TOPICAL_CREAM | Freq: Every day | CUTANEOUS | Status: DC
  Administered 2012-01-01 – 2012-01-04 (×4): via TOPICAL
  Filled 2012-01-01: qty 85

## 2012-01-01 MED ORDER — VANCOMYCIN HCL IN DEXTROSE 1-5 GM/200ML-% IV SOLN
1000.0000 mg | INTRAVENOUS | Status: DC
  Administered 2012-01-01 – 2012-01-03 (×3): 1000 mg via INTRAVENOUS
  Filled 2012-01-01 (×4): qty 200

## 2012-01-01 MED ORDER — LEVOFLOXACIN IN D5W 750 MG/150ML IV SOLN
750.0000 mg | INTRAVENOUS | Status: DC
  Administered 2012-01-02 – 2012-01-04 (×2): 750 mg via INTRAVENOUS
  Filled 2012-01-01 (×2): qty 150

## 2012-01-01 NOTE — Progress Notes (Signed)
Physical Therapy Treatment Patient Details Name: [name] MRN: [id number] DOB: 1970-09-10 Today's Date: 01/01/2012 Time: 4098-1191 PT Time Calculation (min): 24 min  PT Assessment / Plan / Recommendation Comments on Treatment Session  Improved participation with pre-medication today however still very distracted by pain and limiting prolonged session. Question if anxiety if heightening her pain. Educated pt on relaxation techniques to decrease her pain. Also educated pt on the importance of transferring bed<>chair more than just one time a day (pt reports she sat in her chair till 8:30 pm yesterday). Pt verbalized understanding of HEP (ankle pumps, LAchair push ups).     Follow Up Recommendations  Post acute inpatient     Does the patient have the potential to tolerate intense rehabilitation  No, Recommend LTACH  Barriers to Discharge        Equipment Recommendations  Rolling walker with 5" wheels    Recommendations for Other Services    Frequency Min 3X/week   Plan Discharge plan needs to be updated;Frequency remains appropriate    Precautions / Restrictions Precautions Precautions: Fall Restrictions Weight Bearing Restrictions: No LLE Weight Bearing: Weight bearing as tolerated   Pertinent Vitals/Pain C/o 10/10 pain following session (although pt had been premedicated by nursing), RN aware but pt unable to receive any morphine at this time. Educated pt on relaxation techniques as well as assisted in distracting her from her pain which appeared to help.     Mobility  Bed Mobility Supine to Sit: 4: Min assist;HOB elevated (45 degrees) Sitting - Scoot to Edge of Bed: 5: Supervision;With rail Details for Bed Mobility Assistance: verbal cues for safety, min tactile assist to help move LLE off bed Transfers Transfers: Sit to Stand;Stand to Sit Sit to Stand: From elevated surface;1: +2 Total assist;With upper extremity assist Sit to Stand: Patient Percentage: 90% Stand to  Sit: With upper extremity assist;To elevated surface;1: +2 Total assist Stand to Sit: Patient Percentage: 90% Details for Transfer Assistance: 1st sit<>stand pt pulling on scale to bring trunk upright needing bilateral gaurding and minA for safety; stood from elevated surface using upper extremities to assist with minA tactile assist for stabiliy, cues for hand placement with regards to RW Ambulation/Gait Ambulation/Gait Assistance: 4: Min assist Ambulation Distance (Feet): 18 Feet Assistive device: Rolling walker Ambulation/Gait Assistance Details: cues for safe sequencing and speed of movement (pt a bit impulsive because of pain), cues for tall posture and safety with RW Gait Pattern: Step-to pattern;Trunk flexed;Wide base of support;Decreased weight shift to left      PT Goals Acute Rehab PT Goals PT Goal: Supine/Side to Sit - Progress: Progressing toward goal PT Goal: Sit to Stand - Progress: Progressing toward goal PT Transfer Goal: Bed to Chair/Chair to Bed - Progress: Progressing toward goal PT Goal: Ambulate - Progress: Progressing toward goal  Visit Information  Last PT Received On: 01/01/12 Assistance Needed: +2 (chair follow)    Subjective Data  Subjective: Last night they gave me pain meds and it didn't hurt at all when I got back to bed.    Cognition  Overall Cognitive Status: Impaired Area of Impairment: Attention Arousal/Alertness: Awake/alert Orientation Level: Appears intact for tasks assessed Behavior During Session: Anxious Current Attention Level: Selective Cognition - Other Comments: distracted by her pain, talkative needs redirection     Balance     End of Session PT - End of Session Equipment Utilized During Treatment: Gait belt Activity Tolerance: Patient limited by pain;Patient tolerated treatment well Patient left: in chair;with call  bell/phone within reach   GP     [facility] [name] 01/01/2012, 9:56 AM

## 2012-01-01 NOTE — Progress Notes (Addendum)
Advanced Heart Failure Rounding Note   Subjective:    41 yo with history of HTN, gout, morbid obesity, atrial fibrillation and diastolic heart failure.  Admitted to [facility] on 10/14 for massive volume overload.  Transferred to [facility] on 10/26 due to worsening renal function, weakness and volume overload.   Echo: diastolic heart failure with ejection fraction of 55-60%, severe LVH, Mildly dilated RV.  Mildly to mod dilated RA.  moderate pericardial effusion and echocardiographic changes consistent with a possible infiltrative disease.  PAPP 72 mmHg  Fat pad bx negative for amyloid.  Bone marrow aspiration and biopsy was planned for outpatient.  Cr 1.57 (on admit) now 2.48->2.41->2.37>2.35>2.51  Milrinone stopped 10/30 after RHC c/w high output HF RA = 16  RV = 81/14/20  PA = 78/32 (52)  PCW = 21  Fick cardiac output/index = 11.9/4.6  Thermo CO/CI = 9.8/3.8  PVR = 2.6 Woods (Fick)  FA sat = 91%  PA sat = 65%, 72%  SVC sat = 73%  RA sat = 66%  Underwent Bone Marrow bx. Also MRI of LE which showed diffuse cellulitis of LLE. No osteo or abscess. Antibiotics expanded.   LV normal function with severe LVH (? infiltrative) e vs HTN).  RV dilated and at least moderate dysfunction.  Bubble study +.  Yesterday Xarelto 20 mg started.  Weight down total of 105 pounds.   CVP  CO-OX 97 (likely error) Cr 1.57 (on admit) now 2.48->2.41->2.37>2.35>2.51   Complains of L leg pain. Denies SOB/PND. Bone marrow Bx preliminary negative for amyloid.    Objective:    Vital Signs:   Temp:  [97.9 F (36.6 C)-98.9 F (37.2 C)] 98.8 F (37.1 C) (11/05 0800) Pulse Rate:  [98] 98  (11/04 1626) Resp:  [18] 18  (11/04 1626) BP: (113-144)/(64-109) 126/109 mmHg (11/05 0800) SpO2:  [93 %-98 %] 98 % (11/05 0800) Last BM Date: 12/31/11  Weight change: Filed Weights   12/29/11 0515 12/30/11 0500 12/31/11 0600  Weight: 152 kg (335 lb 1.6 oz) 136.6 kg (301 lb 2.4 oz) 136.4 kg (300 lb 11.3 oz)     Intake/Output:   Intake/Output Summary (Last 24 hours) at 01/01/12 0829 Last data filed at 01/01/12 0800  Gross per 24 hour  Intake   4270 ml  Output   1020 ml  Net   3250 ml    CVP (done personally) = 15  Physical Exam: General:  Obese, well appearing. No resp difficulty.  HEENT: normal Neck: supple. RIJ  Sheath Carotids 2+ bilat; no bruits. No lymphadenopathy or thryomegaly appreciated. Cor: PMI nondisplaced. + probable RV lift Regular rate & rhythm. No rubs, gallops or murmurs. Lungs: clear anteriorl Abdomen: obese, soft, nontender, nondistended. Good bowel sounds. Extremities: no cyanosis, clubbing, rash, minimal edema to thigh. L leg erythema and painful to touch Cellulitis/lymphedema on L. RUE PICC. Large gouty nodule R elbow Neuro: alert & orientedx3, cranial nerves grossly intact. moves all 4 extremities w/o difficulty. Affect pleasant  Telemetry: AF 90-100  Labs: Basic Metabolic Panel:  Lab 01/01/12 1610 12/31/11 0500 12/30/11 0915 12/29/11 0517 12/28/11 0501  NA 136 138 140 139 138  K 4.0 3.9 3.3* 3.5 4.1  CL 97 98 98 96 93*  CO2 30 30 32 33* 34*  GLUCOSE 87 90 111* 87 104*  BUN 54* 54* 57* 67* 75*  CREATININE 2.51* 2.35* 2.37* 2.41* 2.48*  CALCIUM 8.6 8.8 8.3* -- --  MG -- -- -- -- --  PHOS -- -- -- -- --  Liver Function Tests:  Lab 12/26/11 0450  AST 20  ALT 18  ALKPHOS 134*  BILITOT 3.1*  PROT 7.6  ALBUMIN 2.4*   No results found for this basename: LIPASE:5,AMYLASE:5 in the last 168 hours No results found for this basename: AMMONIA:3 in the last 168 hours  CBC:  Lab 01/01/12 0513 12/30/11 0500 12/29/11 0517 12/28/11 0501 12/28/11 12/27/11 1000  WBC 27.4* 32.5* 34.9* 35.0* 33.1* --  NEUTROABS -- -- -- -- -- 23.8*  HGB 7.5* 8.3* 8.3* 8.7* 9.0* --  HCT 23.1* 25.4* 25.0* 26.1* 26.8* --  MCV 86.2 84.7 84.7 84.2 84.3 --  PLT 156 200 221 261 252 --    Cardiac Enzymes:  Lab 12/28/11 1029  CKTOTAL 17  CKMB --  CKMBINDEX --  TROPONINI --     BNP: BNP (last 3 results)  Basename 12/14/11 2102 12/11/11 1330  PROBNP 10792.0* 10471.0*       Medications:     Scheduled Medications:    . amLODipine  10 mg Oral Daily  . aspirin  81 mg Oral Daily  . colchicine  0.6 mg Oral Daily  . folic acid  1 mg Oral Daily  . labetalol  400 mg Oral BID  . levofloxacin (LEVAQUIN) IV  750 mg Intravenous Q24H  . pantoprazole  40 mg Oral Daily  . [COMPLETED] potassium chloride  40 mEq Oral BID  . rivaroxaban  20 mg Oral Daily  . sodium chloride  10-40 mL Intracatheter Q12H  . torsemide  40 mg Oral Daily  . vancomycin  1,250 mg Intravenous Q24H  . [DISCONTINUED] heparin  5,000 Units Subcutaneous Q8H    Infusions:    . sodium chloride 20 mL/hr at 12/31/11 1944  . sodium chloride 20 mL/hr at 12/27/11 1600    PRN Medications: sodium chloride, acetaminophen, hydrALAZINE, morphine injection, ondansetron (ZOFRAN) IV, oxyCODONE-acetaminophen, promethazine, sodium chloride   Assessment:   1. Acute on chronic diastolic HF 2. 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 Atrial fibrillation, chronic 4. Morbid obesity, BMI 54.8  5. HTN, uncontrolled 6. Acute on chronic renal failure (baseline Cr 1.5) 7. Cellulitis 8. Anemia  Plan/Discussion:    She is stable. Creatinine slightly worse but not significantly so. CVP relatively stable. Overall weight is down ~100 pounds. Continue Torsemide 40 mg daily. Repeat CO-OX now.   Results of BMBx for possible amyloid pending. Preliminary negative per Dr. Arline Asp. If negative I do not think kidney biopsy is warranted at this time as all evidence points against amyloid (no M-Spike, negative fat pad and negative BMBx). Also echo findings can be explained by combination of severe HTN and OHS related PAH.   Echo with bubble study is early +.  Suspect opening of PFO in setting of PAH. Is on Xarelto, Will plan TEE/ /DC-CV tomorrow to cardiovert and also look for shunt  physiology.  Treatment of cellulitis per primary team.   PT is seeing.   She should be ready for d/c to [facility] or SNF shortly. Can we have [facility] evaluate? We have been co-managing HF with them for several months. Will need outpatient Renal f/u.   Length of Stay: 22 Yumna Ebers,MD 8:42 AM
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
## 2012-01-01 NOTE — Progress Notes (Signed)
[facility] Progress Note [place] TEAM 1 - Stepdown/ICU TEAM   Beverly SCHUCHART ZOX:[id number] DOB: Sep 23, 1970 DOA: 12/10/2011 PCP: [name]., MD  Brief narrative: The patient is a morbidly obese 41 yo woman with a h/o HTN, poorly controlled, who was admitted to [facility] with massive volume overload and because of being refractory to diuresis was transfered to [facility] for ongoing therapy. She has a h/o non-compliance. She has massive peripheral edema and has been obese for many years. She also has a h/o gout with flairs in the past.  Assessment/Plan:  Acute right heart failure/Anasarca/known diastolic dysfunction with chronic heart failure *Care as per HF Team-the heart failure team has recommended that patient continue her rehabilitation at [facility] at their heart failure specific unit *Cardiologist suggest patient's baseline euvolemic CVP should be around 15  ? 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 Had radiographic findings including hepatosplenomegaly concerning for gammaglobulin abnormality or dysfunction. *She is status post bone marrow biopsy in interventional radiology 12/28/11-preliminary pathology is read by Dr. Arline Asp is not suggestive of amyloid *Cardiology endorses that abnormal echo findings are explained by combination of severe hypertension and OHS related pulmonary artery hypertension  Pulmonary HTN-severe *Care as per HF Team  Anemia *Baseline hemoglobin on 12/10/2011 was 11.3 - hemoglobin had drifted down to 7.3 and after additional diuresis increased back up to 8.7 *Cardiology recommending transfusion which would help support oxygen demand in her setting of high-output cardiac heart failure - pt is now s/p 2U PRBC *Fe studies were consistent with anemia of chronic disease type picture (all  indices low normal to low) *TSH only mildly elevated at 4.8  Accelerated hypertension *BP control has been variable - follow w/o change today - No ACE inhibitor or ARB secondary to ongoing renal failure  Atrial fibrillation with controlled ventricular response/Early + echocardiogram bubble study  *Has significant left atrial enlargement of unknown duration also has suspected PFO based on bubble study -cardiology suspects the opening of PFO is in the setting of severe PAH *For TEE/DC-CV 01/02/2012-we'll also use this study to look for shunt physiology *Cardiology starting Xarelto 12/31/2011  Cellulitis of left leg/Fever/ Leukocytosis *Doxy switched to Levaquin 10/28 - Levaquin discontinued 12/26/2011 because felt that the lower extremity findings were more related to chronic venous stasis than a true infection and plans were to follow clinically (was not erythematous at that time). Over the subsequent 48 hours patient had increased cellulitic skin changes extending up to the left lateral thigh therefore Levaquin was resumed and vancomycin added *MRI of left leg w/o evidence of abcess/foci of infection/fasciitis *dopplers were performed but were inconclusive given patient's body habitus and pain in leg *exam suggests that the erythema is resolving today -superficial skin is sloughing off with wound care but otherwise wound appears to be slowly improving *White count is very slowly decreasing and she is not experiencing any fevers  Acute renal failure Renal fxn has fluctuated w/ diuresis - appears to be stable w/ crt at ~2.5   Hyperbilirubinemia *Not obstructive in nature and likely secondary to passive hepatic congestion   Acute gouty arthropathy *responded well to steroids - now off steroids and on maintenance colchicine w/o flare  Morbid obesity with BMI of 50.0-59.9, adult *May have underlying OSA/OHS *Also has striking masculine features - total testosterone level is < 10 making PCOD  very unlikely  DVT prophylaxis: Subcutaneous heparin 3  times a day Code Status: Full Family Communication: Spoke to patient Disposition Plan: step down Weight: 137 kg  Consultants: Heart Failure Team General Surgery - signed off  Procedures: Skin and soft tissue biopsy of abdominal wall by Dr. Leticia Penna on 12/18/2011  Bone marrow biopsy in interventional radiology on 12/28/1998  Antibiotics: Doxycycline 10/21 >>> 10/28 Levaquin 10/28 >>>10/30 Levaquin 12/28/11 >>> Vancomycin 12/28/11 >>>  HPI/Subjective: Resting comfortably today.  Denies sob, n/v, or abdom pain.  Pain in L LE remains well controlled at this time.   Objective: Blood pressure 116/60, pulse 98, temperature 98.7 F (37.1 C), temperature source Oral, resp. rate 18, height 5\' 11"  (1.803 m), weight 136.9 kg (301 lb 13 oz), last menstrual period 12/01/2011, SpO2 99.00%.  Intake/Output Summary (Last 24 hours) at 01/01/12 1220 Last data filed at 01/01/12 1200  Gross per 24 hour  Intake   2750 ml  Output   1020 ml  Net   1730 ml     Exam: General: No acute respiratory distress Lungs: Clear to auscultation bilaterally without wheezes or crackles - BS distant th/o  Cardiovascular: Regular rate without murmur gallop or rub  Abdomen: Nontender, nondistended, soft, bowel sounds positive, no rebound, no ascites, no appreciable mass - morbidly obese  Musculoskeletal: No significant cyanosis, clubbing of bilateral lower extremities Skin: decreased erythmea of L LE - several large anterior stasis ulcers with moderate fibrin debris - no obvious fluctuance palpated and no purulent or foul smelling drainage noted-superficial skin sloughing is noted Neurological: Alert and oriented x 3  Data Reviewed: Basic Metabolic Panel:  Lab 01/01/12 1610 12/31/11 0500 12/30/11 0915 12/29/11 0517 12/28/11 0501  NA 136 138 140 139 138  K 4.0 3.9 3.3* 3.5 4.1  CL 97 98 98 96 93*  CO2 30 30 32 33* 34*  GLUCOSE 87 90 111* 87 104*  BUN  54* 54* 57* 67* 75*  CREATININE 2.51* 2.35* 2.37* 2.41* 2.48*  CALCIUM 8.6 8.8 8.3* 8.8 9.0  MG -- -- -- -- --  PHOS -- -- -- -- --   Liver Function Tests:  Lab 12/26/11 0450  AST 20  ALT 18  ALKPHOS 134*  BILITOT 3.1*  PROT 7.6  ALBUMIN 2.4*   CBC:  Lab 01/01/12 0513 12/30/11 0500 12/29/11 0517 12/28/11 0501 12/28/11 12/27/11 1000  WBC 27.4* 32.5* 34.9* 35.0* 33.1* --  NEUTROABS -- -- -- -- -- 23.8*  HGB 7.5* 8.3* 8.3* 8.7* 9.0* --  HCT 23.1* 25.4* 25.0* 26.1* 26.8* --  MCV 86.2 84.7 84.7 84.2 84.3 --  PLT 156 200 221 261 252 --    Basename 12/14/11 2102 12/11/11 1330  PROBNP 10792.0* 10471.0*     Studies:  Recent x-ray studies have been reviewed in detail by the Attending Physician  Scheduled Meds:  Reviewed in detail by the Attending Physician  Junious Silk, ANP [facility] Office  [phone] Pager [phone]  On-Call/Text Page:      [email or web address]      password [email or web address]  01/01/2012, 12:20 PM   LOS: 22 days     I have examined the patient and reviewed the chart. I agree with to above note which I have modified.   Calvert Cantor, MD [phone]

## 2012-01-01 NOTE — Progress Notes (Addendum)
ANTIBIOTIC CONSULT NOTE - FOLLOW UP  Pharmacy Consult for Vancomycin + Levaquin Indication: LLE cellulitis  Allergies  Allergen Reactions  . Penicillins Hives    Childhood allergy    Patient Measurements: Height: 5\' 11"  (180.3 cm) Weight: 300 lb 11.3 oz (136.4 kg) IBW/kg (Calculated) : 70.8   Vital Signs: Temp: 98.8 F (37.1 C) (11/05 0800) Temp src: Oral (11/05 0800) BP: 126/109 mmHg (11/05 0800) Intake/Output from previous day: 11/04 0701 - 11/05 0700 In: 4260 [P.O.:1160; I.V.:450; IV Piggyback:800] Out: 1020 [Urine:1020] Intake/Output from this shift: Total I/O In: 20 [I.V.:20] Out: -   Labs:  Basename 01/01/12 0513 12/31/11 0500 12/30/11 0915 12/30/11 0500  WBC 27.4* -- -- 32.5*  HGB 7.5* -- -- 8.3*  PLT 156 -- -- 200  LABCREA -- -- -- --  CREATININE 2.51* 2.35* 2.37* --   Estimated Creatinine Clearance: 46.1 ml/min (by C-G formula based on Cr of 2.51).  Assessment: Doxycycline 10/21>>10/28 Vancomycin 10/19>>10/21, 11/1>> IV Levaquin 10/28>> 10/30, 11/1>> 10/19 - blood cx x 2 - no growth, final 10/27 - MRSA screen neg 11/1 MRI - diffuse leg cellulitis  [name] continues on vancomycin and levaquin for LLE cellulitis. Remains in acute renal failure, although serum creatinine has been trending down slightly over the last several days with a slight bump today. UOP recorded as ~ 0.3 ml/kg/hr - on torsemide 40 mg daily. Vancomycin trough this morning is supratherapeutic at 24.7.  Due to decreased CrCl today, Levaquin dose now needs adjustment.  Goal of Therapy:  Vancomycin trough level 15-20 mcg/ml - increased due to diffuse cellulitis, not improving Appropriate Levaquin dosing  Plan:  1) Reduce vancomycin dose to 1000 mg IV q24h 2) Reduce Levaquin to 750 mg IV q48h due to decreased renal funtion   Doris Cheadle, PharmD Clinical Pharmacist Pager: [phone] 01/01/2012 10:30 AM   Will not change rivaroxaban dose with noted change in renal function.  In  clinical studies CrCl was calculated using TBW so Crcl = 65 ml/min using wt 136kg and cr 2.5.  Continue rivaroxaban 20mg  daily and assess for s/s bleeding.  Leota Sauers Pharm.D. CPP, BCPS Clinical Pharmacist [phone] 01/01/2012 1:49 PM

## 2012-01-02 ENCOUNTER — Encounter (HOSPITAL_COMMUNITY): Payer: Self-pay | Admitting: *Deleted

## 2012-01-02 ENCOUNTER — Encounter (HOSPITAL_COMMUNITY)
Admission: EM | Disposition: A | Discharge status: Nursing Home (Includes ICF) | Payer: Self-pay | Source: Home / Self Care | Attending: Internal Medicine

## 2012-01-02 ENCOUNTER — Inpatient Hospital Stay (HOSPITAL_COMMUNITY): Payer: Medicare Other

## 2012-01-02 DIAGNOSIS — D72829 Elevated white blood cell count, unspecified: Secondary | ICD-10-CM

## 2012-01-02 DIAGNOSIS — D696 Thrombocytopenia, unspecified: Secondary | ICD-10-CM

## 2012-01-02 DIAGNOSIS — D649 Anemia, unspecified: Secondary | ICD-10-CM

## 2012-01-02 LAB — CBC
HCT: 21.7 % — ABNORMAL LOW (ref 36.0–46.0)
Hemoglobin: 7.1 g/dL — ABNORMAL LOW (ref 12.0–15.0)
MCH: 28.4 pg (ref 26.0–34.0)
MCHC: 32.7 g/dL (ref 30.0–36.0)
MCV: 86.8 fL (ref 78.0–100.0)
Platelets: 134 10*3/uL — ABNORMAL LOW (ref 150–400)
RBC: 2.44 MIL/uL — ABNORMAL LOW (ref 3.87–5.11)
WBC: 24.7 10*3/uL — ABNORMAL HIGH (ref 4.0–10.5)

## 2012-01-02 LAB — BASIC METABOLIC PANEL
BUN: 52 mg/dL — ABNORMAL HIGH (ref 6–23)
CO2: 28 mEq/L (ref 19–32)
Chloride: 97 mEq/L (ref 96–112)
GFR calc Af Amer: 25 mL/min — ABNORMAL LOW (ref >90)
Glucose, Bld: 86 mg/dL (ref 70–99)
Potassium: 3.7 mEq/L (ref 3.5–5.1)

## 2012-01-02 LAB — LACTATE DEHYDROGENASE: LDH: 222 U/L (ref 94–250)

## 2012-01-02 SURGERY — CANCELLED PROCEDURE

## 2012-01-02 MED ORDER — TORSEMIDE 20 MG PO TABS
20.0000 mg | ORAL_TABLET | Freq: Every day | ORAL | Status: DC
  Administered 2012-01-03 – 2012-01-04 (×2): 20 mg via ORAL
  Filled 2012-01-02 (×2): qty 1

## 2012-01-02 MED ORDER — ALTEPLASE 2 MG IJ SOLR
2.0000 mg | Freq: Once | INTRAMUSCULAR | Status: AC
Start: 2012-01-02 — End: 2012-01-02
  Administered 2012-01-02: 2 mg
  Filled 2012-01-02: qty 2

## 2012-01-02 MED ORDER — ALTEPLASE 2 MG IJ SOLR
2.0000 mg | Freq: Once | INTRAMUSCULAR | Status: DC
  Filled 2012-01-02: qty 2

## 2012-01-02 MED ORDER — DARBEPOETIN ALFA-POLYSORBATE 150 MCG/0.3ML IJ SOLN
150.0000 ug | INTRAMUSCULAR | Status: DC
  Administered 2012-01-03: 150 ug via SUBCUTANEOUS
  Filled 2012-01-02 (×2): qty 0.3

## 2012-01-02 MED ORDER — RIVAROXABAN 15 MG PO TABS
15.0000 mg | ORAL_TABLET | Freq: Every day | ORAL | Status: DC
  Administered 2012-01-02 – 2012-01-03 (×2): 15 mg via ORAL
  Filled 2012-01-02 (×3): qty 1

## 2012-01-02 MED ORDER — SODIUM CHLORIDE 0.9 % IV SOLN
INTRAVENOUS | Status: DC
  Administered 2012-01-02: 11:00:00 via INTRAVENOUS
  Administered 2012-01-03: 250 mL via INTRAVENOUS

## 2012-01-02 MED ORDER — ALTEPLASE 2 MG IJ SOLR
2.0000 mg | Freq: Once | INTRAMUSCULAR | Status: AC
  Administered 2012-01-02: 2 mg
  Filled 2012-01-02: qty 2

## 2012-01-02 NOTE — Progress Notes (Signed)
Bone marrow from 12/28/2011 shows no evidence of amyloid or multiple myeloma.  Iron stores were increased and granulocytic hyperplasia was present.  Of note were previous negative fat pad biopsy and absence of monoclonal protein in serum and urine.  Therefore amyloidosis is very unlikely.  Platelet count has been slowly decreasing over the past week, 134k today.  Heparin has recently been discontinued and patient is on Xarelto (Cr 2.6).  Will order HIT panel.  Patient on Vancomycin for left leg cellulitis.  To consider stopping Vancomycin if platelet count continues to drop.  Anemia seems to be multifactorial including renal insufficiency.  Note negative stools and CT scans for bleed.  HGB 7.1 today.  Doubt significant hemolysis.  Haptoglobin is pending.  To transfuse with RBCs.  Will start Aranesp which should be continued after discharge.  Patient has developed leukocytosis (24.7 today) which was first noted on 12/23/2011 (WBC was 10.9 on 12/11/2011), and most likely is due to cellulitis of left leg and recent use of corticosteroids.  Will be happy to discuss with you.  Samul Dada 01/02/2012 11:03 PM

## 2012-01-02 NOTE — Progress Notes (Signed)
OT Cancellation Note  Patient Details Name: SHANTAVIA JHA MRN: [id number] DOB: May 07, 1970   Cancelled Treatment:    Reason Eval/Treat Not Completed: Pain limiting ability to participate. [facility], OTR/L  [phone] 01/02/2012  Rual Vermeer,HILLARY 01/02/2012, 9:28 AM

## 2012-01-02 NOTE — Progress Notes (Signed)
[facility] Progress Note [place] TEAM 1 - Stepdown/ICU TEAM   [name] JYN:[id number] DOB: 12/12/1970 DOA: 12/10/2011 PCP: [name]., MD  Brief narrative: The patient is a morbidly obese 41 yo woman with a h/o HTN, poorly controlled, who was admitted to [facility] with massive volume overload and because of being refractory to diuresis was transfered to [facility] for ongoing therapy. She has a h/o non-compliance. She has massive peripheral edema and has been obese for many years. She also has a h/o gout with flairs in the past.  Assessment/Plan:  Acute right heart failure/Anasarca/known diastolic dysfunction with chronic heart failure *Care as per HF Team-the heart failure team has recommended that patient continue her rehabilitation at [facility] at their heart failure specific unit *Cardiologist suggest patient's baseline euvolemic CVP should be around 15  ? 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 Had radiographic findings including hepatosplenomegaly concerning for gammaglobulin abnormality or dysfunction. *She is status post bone marrow biopsy in interventional radiology 12/28/11-preliminary pathology is read by Dr. Arline Asp as not suggestive of amyloid *Cardiology endorses that abnormal echo findings are explained by combination of severe hypertension and OHS related pulmonary artery hypertension  Pulmonary HTN-severe *Care as per HF Team  Anemia *Baseline hemoglobin on 12/10/2011 was 11.3 *Today hemoglobin has drifted down to 7.1 and subsequently has delayed TEE procedure. We'll check CT abdomen and pelvis to rule out retroperitoneal hematoma - the patient currently is asymptomatic i.e. no back pain or abdominal pain *Cardiology recommending transfusion which would help support oxygen demand in her setting  of high-output cardiac heart failure - pt is now s/p 2U PRBC *Fe studies were consistent with anemia of chronic disease type picture (all indices low normal to low) *TSH only mildly elevated at 4.8 *if CT abdom w/o evidence of RPH will check labs to r/o hemolysis  Accelerated hypertension *BP control has been variable - follow w/o change today - No ACE inhibitor or ARB secondary to ongoing renal failure  Atrial fibrillation with controlled ventricular response/Early + echocardiogram bubble study  *Has significant left atrial enlargement of unknown duration also has suspected PFO based on bubble study -cardiology suspects the opening of PFO is in the setting of severe PAH *For TEE/DC-CV -has been rescheduled tentatively for 01/03/2012 if hemoglobin is stable *Cardiology started Xarelto 12/31/2011  Cellulitis of left leg/Fever/ Leukocytosis *Doxy switched to Levaquin 10/28 - Levaquin discontinued 12/26/2011 because felt that the lower extremity findings were more related to chronic venous stasis than a true infection and plans were to follow clinically (was not erythematous at that time). Over the subsequent 48 hours patient had increased cellulitic skin changes extending up to the left lateral thigh therefore Levaquin was resumed and vancomycin added *MRI of left leg w/o evidence of abcess/foci of infection/fasciitis *dopplers were performed but were inconclusive given patient's body habitus and pain in leg *exam suggests that the erythema is resolving today -superficial skin is sloughing off with wound care but otherwise wound appears to be slowly improving *White count is very slowly decreasing and she is not experiencing any fevers  Acute renal failure Renal fxn has fluctuated w/ diuresis - appears to be stable w/ crt at ~2.5   Hyperbilirubinemia *Not obstructive in nature and likely secondary to passive hepatic congestion   Acute gouty arthropathy *responded well to steroids - now off  steroids and on maintenance colchicine w/o flare  Morbid obesity with BMI of 50.0-59.9, adult *May have underlying OSA/OHS *Also has striking masculine features - total testosterone level is < 10 making PCOD very unlikely  DVT prophylaxis: Xarelto Code Status: Full Family Communication: Spoke to patient Disposition Plan: Remain in telemetry-once acute workup is completed plan is to charge to [facility] heart failure unit Weight: 137 kg  Consultants: Heart Failure Team General Surgery - signed off  Procedures: Skin and soft tissue biopsy of abdominal wall by Dr. Leticia Penna on 12/18/2011  Bone marrow biopsy in interventional radiology on 12/28/1998  Antibiotics: Doxycycline 10/21 >>> 10/28 Levaquin 10/28 >>>10/30 Levaquin 12/28/11 >>> Vancomycin 12/28/11 >>>  HPI/Subjective: No specific complaints regarding her status. Her sister is in the room and questions answered regarding discharge plan.  No change in pain in L LE.  No chest pain.     Objective: Blood pressure 124/71, pulse 90, temperature 97.8 F (36.6 C), temperature source Oral, resp. rate 22, height 5\' 11"  (1.803 m), weight 136.9 kg (301 lb 13 oz), last menstrual period 12/01/2011, SpO2 93.00%.  Intake/Output Summary (Last 24 hours) at 01/02/12 1420 Last data filed at 01/02/12 1419  Gross per 24 hour  Intake    620 ml  Output   2176 ml  Net  -1556 ml     Exam: General: No acute respiratory distress Lungs: Clear to auscultation bilaterally without wheezes or crackles - BS distant th/o  Cardiovascular: Regular rate without murmur gallop or rub  Abdomen: Nontender, nondistended, soft, bowel sounds positive, no rebound, no ascites, no appreciable mass - morbidly obese  Musculoskeletal: No significant cyanosis, clubbing of bilateral lower extremities Skin: decreased erythmea of L LE - several large anterior stasis ulcers with moderate fibrin debris - no obvious fluctuance palpated and no purulent or foul smelling  drainage noted-superficial skin sloughing is noted Neurological: Alert and oriented x 3  Data Reviewed: Basic Metabolic Panel:  Lab 01/02/12 1478 01/01/12 0513 12/31/11 0500 12/30/11 0915 12/29/11 0517  NA 136 136 138 140 139  K 3.7 4.0 3.9 3.3* 3.5  CL 97 97 98 98 96  CO2 28 30 30  32 33*  GLUCOSE 86 87 90 111* 87  BUN 52* 54* 54* 57* 67*  CREATININE 2.61* 2.51* 2.35* 2.37* 2.41*  CALCIUM 8.6 8.6 8.8 8.3* 8.8  MG -- -- -- -- --  PHOS -- -- -- -- --   CBC:  Lab 01/02/12 0500 01/01/12 0513 12/30/11 0500 12/29/11 0517 12/28/11 0501 12/27/11 1000  WBC 23.4* 27.4* 32.5* 34.9* 35.0* --  NEUTROABS -- -- -- -- -- 23.8*  HGB 7.1* 7.5* 8.3* 8.3* 8.7* --  HCT 21.7* 23.1* 25.4* 25.0* 26.1* --  MCV 86.8 86.2 84.7 84.7 84.2 --  PLT 137* 156 200 221 261 --    Basename 12/14/11 2102 12/11/11 1330  PROBNP 10792.0* 10471.0*     Studies:  Recent x-ray studies have been reviewed in detail by the Attending Physician  Scheduled Meds:  Reviewed in detail by the Attending Physician  Junious Silk, ANP [facility] Office  [phone] Pager [phone]  On-Call/Text Page:      [email or web address]      password [facility]  01/02/2012, 2:20 PM   LOS: 23 days   I have personally examined this patient and reviewed the entire database. I have reviewed the above note, made any necessary editorial changes, and agree with its content.  Lonia Blood, MD [facility]

## 2012-01-02 NOTE — Progress Notes (Signed)
Advanced Heart Failure Rounding Note   Subjective:    41 yo with history of HTN, gout, morbid obesity, atrial fibrillation and diastolic heart failure.  Admitted to [facility] on 10/14 for massive volume overload.  Transferred to [facility] on 10/26 due to worsening renal function, weakness and volume overload.   Echo: diastolic heart failure with ejection fraction of 55-60%, severe LVH, Mildly dilated RV.  Mildly to mod dilated RA.  moderate pericardial effusion and echocardiographic changes consistent with a possible infiltrative disease.  PAPP 72 mmHg  Fat pad bx negative for amyloid.  Bone marrow aspiration and biopsy was planned for outpatient.  Milrinone stopped 10/30 after RHC c/w high output HF. 12/27/11 Received 2 UPRBC.  RA = 16  RV = 81/14/20  PA = 78/32 (52)  PCW = 21  Fick cardiac output/index = 11.9/4.6  Thermo CO/CI = 9.8/3.8  PVR = 2.6 Woods (Fick)  FA sat = 91%  PA sat = 65%, 72%  SVC sat = 73%  RA sat = 66%  Underwent Bone Marrow bx. Also MRI of LE which showed diffuse cellulitis of LLE. No osteo or abscess. Antibiotics expanded.   LV normal function with severe LVH (? infiltrative) e vs HTN).  RV dilated and at least moderate dysfunction.  Bubble study +.  Continues on Xarelto 20 mg.   Weight down total of 104 pounds. 01/01/12 Transferred to 4700.   Hemoglobin 9.0>8.3> 7.5>7.1  FOBT negative 12/29/11  Cr 1.57 (on admit) now 2.48->2.41->2.37>2.35>2.51>2.61   Complains of L leg pain. Denies SOB/PND. Bone marrow Bx preliminary negative for amyloid.    Objective:    Vital Signs:   Temp:  [97.8 F (36.6 C)-100 F (37.8 C)] 97.8 F (36.6 C) (11/06 1342) Pulse Rate:  [89-91] 90  (11/06 1342) Resp:  [18-22] 22  (11/06 1342) BP: (110-148)/(65-89) 124/71 mmHg (11/06 1342) SpO2:  [93 %-96 %] 93 % (11/06 1342) Last BM Date: 12/31/11  Weight change: Filed Weights   12/30/11 0500 12/31/11 0600 01/01/12 1200  Weight: 136.6 kg (301 lb 2.4 oz) 136.4 kg (300 lb 11.3 oz)  136.9 kg (301 lb 13 oz)    Intake/Output:   Intake/Output Summary (Last 24 hours) at 01/02/12 1521 Last data filed at 01/02/12 1419  Gross per 24 hour  Intake    620 ml  Output   1276 ml  Net   -656 ml     Physical Exam: General:  Obese, well appearing. No resp difficulty. Sister at bedside HEENT: normal Neck: supple.  Carotids 2+ bilat; no bruits. No lymphadenopathy or thryomegaly appreciated. Cor: PMI nondisplaced. + probable RV lift Regular rate & rhythm. No rubs, gallops or murmurs. Lungs: clear anteriorl Abdomen: obese, soft, nontender, nondistended. Good bowel sounds. Extremities: no cyanosis, clubbing, rash, minimal edema to thigh. L leg erythema and painful to touch dressings in place to LLE.Cellulitis/lymphedema on L. RUE PICC. Large gouty nodule R elbow Neuro: alert & orientedx3, cranial nerves grossly intact. moves all 4 extremities w/o difficulty. Affect pleasant  Telemetry: AF 90-100  Labs: Basic Metabolic Panel:  Lab 01/02/12 2952 01/01/12 0513 12/31/11 0500 12/30/11 0915 12/29/11 0517  NA 136 136 138 140 139  K 3.7 4.0 3.9 3.3* 3.5  CL 97 97 98 98 96  CO2 28 30 30  32 33*  GLUCOSE 86 87 90 111* 87  BUN 52* 54* 54* 57* 67*  CREATININE 2.61* 2.51* 2.35* 2.37* 2.41*  CALCIUM 8.6 8.6 8.8 -- --  MG -- -- -- -- --  PHOS -- -- -- -- --    Liver Function Tests: No results found for this basename: AST:5,ALT:5,ALKPHOS:5,BILITOT:5,PROT:5,ALBUMIN:5 in the last 168 hours No results found for this basename: LIPASE:5,AMYLASE:5 in the last 168 hours No results found for this basename: AMMONIA:3 in the last 168 hours  CBC:  Lab 01/02/12 0500 01/01/12 0513 12/30/11 0500 12/29/11 0517 12/28/11 0501 12/27/11 1000  WBC 23.4* 27.4* 32.5* 34.9* 35.0* --  NEUTROABS -- -- -- -- -- 23.8*  HGB 7.1* 7.5* 8.3* 8.3* 8.7* --  HCT 21.7* 23.1* 25.4* 25.0* 26.1* --  MCV 86.8 86.2 84.7 84.7 84.2 --  PLT 137* 156 200 221 261 --    Cardiac Enzymes:  Lab 12/28/11 1029  CKTOTAL 17   CKMB --  CKMBINDEX --  TROPONINI --    BNP: BNP (last 3 results)  Basename 12/14/11 2102 12/11/11 1330  PROBNP 10792.0* 10471.0*       Medications:     Scheduled Medications:    . allopurinol  100 mg Oral Daily  . alteplase  2 mg Intracatheter Once  . [COMPLETED] alteplase  2 mg Intracatheter Once  . [COMPLETED] alteplase  2 mg Intracatheter Once  . amLODipine  10 mg Oral Daily  . aspirin  81 mg Oral Daily  . colchicine  0.6 mg Oral Daily  . folic acid  1 mg Oral Daily  . labetalol  400 mg Oral BID  . levofloxacin (LEVAQUIN) IV  750 mg Intravenous Q48H  . pantoprazole  40 mg Oral Daily  . rivaroxaban  15 mg Oral Q supper  . silver sulfADIAZINE   Topical Daily  . sodium chloride  10-40 mL Intracatheter Q12H  . torsemide  40 mg Oral Daily  . vancomycin  1,000 mg Intravenous Q24H  . [DISCONTINUED] rivaroxaban  20 mg Oral Daily    Infusions:    . sodium chloride 20 mL/hr at 12/31/11 1944  . sodium chloride Stopped (01/02/12 1023)  . sodium chloride 20 mL/hr at 01/02/12 1037    PRN Medications: sodium chloride, acetaminophen, hydrALAZINE, morphine injection, ondansetron (ZOFRAN) IV, oxyCODONE-acetaminophen, promethazine, sodium chloride   Assessment:   1. Acute on chronic diastolic HF 2. 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 Atrial fibrillation, chronic 4. Morbid obesity, BMI 54.8  5. HTN, uncontrolled 6. Acute on chronic renal failure (baseline Cr 1.5) 7. Cellulitis 8. Anemia  Plan/Discussion:    She is stable. Creatinine trending up slightly 2.3> 2.5>2.6. Overall weight is down ~100 pounds.  Hold torsemide today. Restart at 20mg  in am.   Results of BMBx for possible amyloid pending. Preliminary negative per Dr. Arline Asp. If negative I do not think kidney biopsy is warranted at this time as all evidence points against amyloid (no M-Spike, negative fat pad and negative BMBx). Also echo findings can be explained by combination of  severe HTN and OHS related PAH.   Echo with bubble study is early +.  Suspect opening of PFO in setting of PAH. Is on Xarelto. Hold off on TEE /DC-CV due to dropping hemoglobin. Will reschedule for tomorrow - as long as HGb stable.  Treatment of cellulitis per primary team.   Hemoglobin continues to trend down over the last few days. No obvious source bleeding. Primary team to address anemia.   She should be ready for d/c to [facility] or SNF shortly. [facility] accepted.  Will need outpatient Renal and HF f/u.   Length of Stay: 23  Daniel Bensimhon,MD 3:23 PM

## 2012-01-03 ENCOUNTER — Encounter (HOSPITAL_COMMUNITY)
Admission: EM | Disposition: A | Discharge status: Nursing Home (Includes ICF) | Payer: Self-pay | Source: Home / Self Care | Attending: Internal Medicine

## 2012-01-03 LAB — CBC
Hemoglobin: 7 g/dL — ABNORMAL LOW (ref 12.0–15.0)
MCH: 29.5 pg (ref 26.0–34.0)
RBC: 2.37 MIL/uL — ABNORMAL LOW (ref 3.87–5.11)

## 2012-01-03 LAB — CULTURE, BLOOD (ROUTINE X 2): Culture: NO GROWTH

## 2012-01-03 LAB — BASIC METABOLIC PANEL
BUN: 51 mg/dL — ABNORMAL HIGH (ref 6–23)
Creatinine, Ser: 2.76 mg/dL — ABNORMAL HIGH (ref 0.50–1.10)
GFR calc non Af Amer: 21 mL/min — ABNORMAL LOW (ref >90)
Glucose, Bld: 76 mg/dL (ref 70–99)
Potassium: 3.7 mEq/L (ref 3.5–5.1)

## 2012-01-03 LAB — CARBOXYHEMOGLOBIN
Carboxyhemoglobin: 2.5 % — ABNORMAL HIGH (ref 0.5–1.5)
Methemoglobin: 1.3 % (ref 0.0–1.5)

## 2012-01-03 LAB — HAPTOGLOBIN: Haptoglobin: 135 mg/dL (ref 45–215)

## 2012-01-03 LAB — OCCULT BLOOD X 1 CARD TO LAB, STOOL: Fecal Occult Bld: NEGATIVE

## 2012-01-03 SURGERY — ECHOCARDIOGRAM, TRANSESOPHAGEAL
Anesthesia: Moderate Sedation

## 2012-01-03 MED ORDER — FUROSEMIDE 10 MG/ML IJ SOLN
40.0000 mg | Freq: Once | INTRAMUSCULAR | Status: AC
  Administered 2012-01-03: 40 mg via INTRAVENOUS
  Filled 2012-01-03: qty 4

## 2012-01-03 NOTE — Progress Notes (Signed)
OT Cancellation Note  Patient Details Name: IRIEL NASON MRN: [id number] DOB: 01/22/1971   Cancelled Treatment:    Reason Eval/Treat Not Completed: Medical issues which prohibited therapy (pt receiving blood)  Tiahna Cure,HILLARYHilary Berneita Sanagustin, OTR/L  [phone] 01/03/2012 01/03/2012, 12:02 PM

## 2012-01-03 NOTE — Progress Notes (Signed)
01/03/12 1600 Received call from sister, Archie Patten, and she stated she had thought about the selection of LTACH, and she wanted her sister to be at [facility], not [facility].  This NCM called Dr. Lavera Guise to make him aware of choice.  Pt. may be ready for tx tomorrow.  Tera Mater, RN, BSN NCM [phone]

## 2012-01-03 NOTE — Progress Notes (Signed)
Advanced Heart Failure Rounding Note   Subjective:    41 yo with history of HTN, gout, morbid obesity, atrial fibrillation and diastolic heart failure.  Admitted to [facility] on 10/14 for massive volume overload.  Transferred to [facility] on 10/26 due to worsening renal function, weakness and volume overload.   Echo: diastolic heart failure with ejection fraction of 55-60%, severe LVH, Mildly dilated RV.  Mildly to mod dilated RA.  moderate pericardial effusion and echocardiographic changes consistent with a possible infiltrative disease.  PAPP 72 mmHg  Fat pad bx negative for amyloid.  Bone marrow aspiration and biopsy was planned for outpatient.  Milrinone stopped 10/30 after RHC c/w high output HF. 12/27/11 Received 2 UPRBC.  RA = 16  RV = 81/14/20  PA = 78/32 (52)  PCW = 21  Fick cardiac output/index = 11.9/4.6  Thermo CO/CI = 9.8/3.8  PVR = 2.6 Woods (Fick)  FA sat = 91%  PA sat = 65%, 72%  SVC sat = 73%  RA sat = 66%  Underwent Bone Marrow bx. Also MRI of LE which showed diffuse cellulitis of LLE. No osteo or abscess. Antibiotics expanded.   LV normal function with severe LVH (? infiltrative) e vs HTN).  RV dilated and at least moderate dysfunction.  Bubble study +.  Continues on Xarelto 20 mg.   Weight down total of 104 pounds. 01/01/12 Transferred to 4700.   Hemoglobin 9.0>8.3> 7.5>7.1  FOBT negative 12/29/11  Cr 1.57 (on admit) now 2.48->2.41->2.37>2.35>2.51>2.61   Complains of L leg pain. Denies SOB/PND. Bone marrow Bx preliminary negative for amyloid.    Objective:    Vital Signs:   Temp:  [97.8 F (36.6 C)-99.2 F (37.3 C)] 98.6 F (37 C) (11/07 0359) Pulse Rate:  [88-91] 91  (11/07 0359) Resp:  [19-22] 20  (11/07 0359) BP: (111-148)/(60-89) 111/60 mmHg (11/07 0359) SpO2:  [93 %-96 %] 96 % (11/07 0359) Last BM Date: 01/02/12  Weight change: Filed Weights   12/31/11 0600 01/01/12 1200  Weight: 136.4 kg (300 lb 11.3 oz) 136.9 kg (301 lb 13 oz)     Intake/Output:   Intake/Output Summary (Last 24 hours) at 01/03/12 0926 Last data filed at 01/03/12 0847  Gross per 24 hour  Intake    680 ml  Output    826 ml  Net   -146 ml     Physical Exam: General:  Obese, well appearing. No resp difficulty. Sister at bedside HEENT: normal Neck: supple.  Carotids 2+ bilat; no bruits. No lymphadenopathy or thryomegaly appreciated. Cor: PMI nondisplaced. + probable RV lift Regular rate & rhythm. No rubs, gallops or murmurs. Lungs: clear anteriorl Abdomen: obese, soft, nontender, nondistended. Good bowel sounds. Extremities: no cyanosis, clubbing, rash, minimal edema to thigh. L leg erythema and painful to touch dressings in place to LLE.Cellulitis/lymphedema on L. RUE PICC. Large gouty nodule R elbow Neuro: alert & orientedx3, cranial nerves grossly intact. moves all 4 extremities w/o difficulty. Affect pleasant  Telemetry: AF 90-100  Labs: Basic Metabolic Panel:  Lab 01/03/12 7846 01/02/12 0500 01/01/12 0513 12/31/11 0500 12/30/11 0915  NA 135 136 136 138 140  K 3.7 3.7 4.0 3.9 3.3*  CL 97 97 97 98 98  CO2 28 28 30 30  32  GLUCOSE 76 86 87 90 111*  BUN 51* 52* 54* 54* 57*  CREATININE 2.76* 2.61* 2.51* 2.35* 2.37*  CALCIUM 8.5 8.6 8.6 -- --  MG -- -- -- -- --  PHOS -- -- -- -- --  Liver Function Tests:  Lab 01/02/12 2050  AST --  ALT --  ALKPHOS --  BILITOT 4.6*  PROT --  ALBUMIN --   No results found for this basename: LIPASE:5,AMYLASE:5 in the last 168 hours No results found for this basename: AMMONIA:3 in the last 168 hours  CBC:  Lab 01/03/12 0412 01/02/12 2050 01/02/12 0500 01/01/12 0513 12/30/11 0500 12/27/11 1000  WBC 22.6* 24.7* 23.4* 27.4* 32.5* --  NEUTROABS -- -- -- -- -- 23.8*  HGB 7.0* 7.1* 7.1* 7.5* 8.3* --  HCT 20.5* 21.0* 21.7* 23.1* 25.4* --  MCV 86.5 86.1 86.8 86.2 84.7 --  PLT 146* 134* 137* 156 200 --    Cardiac Enzymes:  Lab 12/28/11 1029  CKTOTAL 17  CKMB --  CKMBINDEX --  TROPONINI  --    BNP: BNP (last 3 results)  Basename 12/14/11 2102 12/11/11 1330  PROBNP 10792.0* 10471.0*       Medications:     Scheduled Medications:    . allopurinol  100 mg Oral Daily  . alteplase  2 mg Intracatheter Once  . [COMPLETED] alteplase  2 mg Intracatheter Once  . [COMPLETED] alteplase  2 mg Intracatheter Once  . amLODipine  10 mg Oral Daily  . aspirin  81 mg Oral Daily  . colchicine  0.6 mg Oral Daily  . darbepoetin (ARANESP) injection - NON-DIALYSIS  150 mcg Subcutaneous Q Thu-1800  . folic acid  1 mg Oral Daily  . labetalol  400 mg Oral BID  . levofloxacin (LEVAQUIN) IV  750 mg Intravenous Q48H  . pantoprazole  40 mg Oral Daily  . rivaroxaban  15 mg Oral Q supper  . silver sulfADIAZINE   Topical Daily  . sodium chloride  10-40 mL Intracatheter Q12H  . torsemide  20 mg Oral Daily  . vancomycin  1,000 mg Intravenous Q24H  . [DISCONTINUED] rivaroxaban  20 mg Oral Daily  . [DISCONTINUED] torsemide  40 mg Oral Daily    Infusions:    . sodium chloride 20 mL/hr at 12/31/11 1944  . sodium chloride Stopped (01/02/12 1023)  . sodium chloride 20 mL/hr at 01/02/12 1037    PRN Medications: sodium chloride, acetaminophen, hydrALAZINE, morphine injection, ondansetron (ZOFRAN) IV, oxyCODONE-acetaminophen, promethazine, sodium chloride   Assessment:   1. Acute on chronic diastolic HF 2. 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 Atrial fibrillation, chronic 4. Morbid obesity, BMI 54.8  5. HTN, uncontrolled 6. Acute on chronic renal failure (baseline Cr 1.5) 7. Cellulitis 8. Anemia  Plan/Discussion:    She is doing well. Heme note reviewed and appreciated. Hgb stable today. CT abdomen negative.  Creatinine continues to trend up slightly. Cellulitis seems to be improving.   I think the best plan is to defer TEE/DC-CV for now until we can be assured that her Hgb is stable before attempted cardioversion which will require a minimum of 4 weeks on  continuous anti-coagulation.   Would continue low-dose demadex and if creatinine stable in am I feel she can go to [facility] tomorrow (if primary team agrees) with close f/u in the HF clinic.   Length of Stay: 24   Windsor Goeken,MD 9:30 AM

## 2012-01-03 NOTE — Plan of Care (Signed)
Problem: Phase I Progression Outcomes Goal: EF % per last Echo/documented,Core Reminder form on chart Outcome: Completed/Met Date Met:  01/03/12 EF 60-65% per ECHO performed on 12/28/11.

## 2012-01-03 NOTE — Progress Notes (Signed)
[facility] Progress Note    [name] UJW:[id number] DOB: August 20, 1970 DOA: 12/10/2011 PCP: [name]., MD  Brief narrative: The patient is a morbidly obese 41 yo woman with a h/o HTN, poorly controlled, who was admitted to [facility] with massive volume overload and because of being refractory to diuresis was transfered to [facility] for ongoing therapy. She has a h/o non-compliance. She has massive peripheral edema and has been obese for many years. She also has a h/o gout with flairs in the past.Also has bilateral LE leg ulcers, thrombocytopenia and leukocytosis. Bm biopsy negative for amyloidosis. R heart cath on 10/30 - RA = 16  RV = 81/14/20  PA = 78/32 (52)  PCW = 21  Fick cardiac output/index = 11.9/4.6  Thermo CO/CI = 9.8/3.8  PVR = 2.6 Woods (Fick)  FA sat = 91%  PA sat = 65%, 72%  SVC sat = 73%  RA sat = 66%   Assessment/Plan:  Acute right heart failure/Anasarca/known diastolic dysfunction with chronic heart failure *Care as per HF Team-the heart failure team has recommended that patient continue her rehabilitation at [facility] at their heart failure specific unit   *EF is preserved *Appreciate hematology evaluation - had elevated free light chains SPEP and UPEP w/ negative fat pad biopsy for amyloid. Had radiographic findings including hepatosplenomegaly concerning for gammaglobulin abnormality or dysfunction. *She is status post bone marrow biopsy in interventional radiology 12/28/11-preliminary pathology is read by Dr. Arline Asp as not suggestive of amyloid *Cardiology endorses that abnormal echo findings are explained by combination of severe hypertension and OHS related pulmonary artery hypertension  Pulmonary HTN-severe *Care as per HF Team  Anemia *Baseline hemoglobin on 12/10/2011 was 11.3 *Today hemoglobin has drifted down to 7.1 and subsequently has delayed TEE procedure. We'll check CT abdomen and pelvis to rule out retroperitoneal hematoma  - the patient currently is asymptomatic i.e. no back pain or abdominal pain *Cardiology recommending transfusion which would help support oxygen demand in her setting of high-output cardiac heart failure - pt is now s/p 2U PRBC *Fe studies were consistent with anemia of chronic disease type picture (all indices low normal to low) *TSH only mildly elevated at 4.8 No evidence for hemolysis No evidence for Retroperitoneal hematoma on Ct 01/03/12 Transfused 1 unit of prbcs - 01/03/12  -   Accelerated hypertension *BP control has been variable - follow w/o change today - No ACE inhibitor or ARB secondary to ongoing renal failure  Atrial fibrillation with controlled ventricular response/Early + echocardiogram bubble study  *Has significant left atrial enlargement of unknown duration also has suspected PFO based on bubble study -cardiology suspects the opening of PFO is in the setting of severe PAH *For TEE/DC-CV -has been rescheduled tentatively for 01/03/2012 if hemoglobin is stable *Cardiology started Xarelto 12/31/2011  Cellulitis of left leg/Fever/ Leukocytosis *Doxy switched to Levaquin 10/28 - Levaquin discontinued 12/26/2011 because felt that the lower extremity findings were more related to chronic venous stasis than a true infection and plans were to follow clinically (was not erythematous at that time). Over the subsequent 48 hours patient had increased cellulitic skin changes extending up to the left lateral thigh therefore Levaquin was resumed and vancomycin added *MRI of left leg w/o evidence of abcess/foci of infection/fasciitis *dopplers were performed but were inconclusive given patient's body habitus and pain in leg *exam suggests that the erythema is resolving today -superficial skin is sloughing off with wound care but otherwise wound appears to be slowly improving *White count  is very slowly decreasing and she is not experiencing any fevers  Acute renal failure Renal fxn has  fluctuated w/ diuresis - appears to be stable w/ crt at ~2.5   Hyperbilirubinemia *Not obstructive in nature and likely secondary to passive hepatic congestion   Acute gouty arthropathy *responded well to steroids - now off steroids and on maintenance colchicine w/o flare  Morbid obesity with BMI of 50.0-59.9, adult *May have underlying OSA/OHS  *Also has striking masculine features - total testosterone level is < 10 making PCOD very unlikely  DVT prophylaxis: Xarelto Code Status: Full Family Communication: Spoke to patient Disposition Plan: Remain in telemetry-once acute workup is completed plan is to transfer to LTAC    Consultants: Heart Failure Team General Surgery - signed off  Procedures: Skin and soft tissue biopsy of abdominal wall by Dr. Leticia Penna on 12/18/2011  Bone marrow biopsy in interventional radiology on 12/28/1998  Antibiotics: Doxycycline 10/21 >>> 10/28 Levaquin 10/28 >>>10/30 Levaquin 12/28/11 >>> Vancomycin 12/28/11 >>>  HPI/Subjective: No new complains   Objective: Blood pressure 112/56, pulse 79, temperature 98.1 F (36.7 C), temperature source Oral, resp. rate 20, height 5\' 11"  (1.803 m), weight 136.9 kg (301 lb 13 oz), last menstrual period 12/01/2011, SpO2 98.00%.  Intake/Output Summary (Last 24 hours) at 01/03/12 1825 Last data filed at 01/03/12 1446  Gross per 24 hour  Intake   1050 ml  Output   1451 ml  Net   -401 ml     Exam: General: No acute respiratory distress Lungs: Clear to auscultation bilaterally without wheezes or crackles - BS distant th/o  Cardiovascular: Regular rate without murmur gallop or rub  Abdomen: Nontender, nondistended, soft, bowel sounds positive, no rebound, no ascites, no appreciable mass - morbidly obese  Musculoskeletal: No significant cyanosis, clubbing of bilateral lower extremities Skin: decreased erythmea of L LE - several large anterior stasis ulcers with moderate fibrin debris - no obvious fluctuance  palpated and no purulent or foul smelling drainage noted-superficial skin sloughing is noted Neurological: Alert and oriented x 3  Data Reviewed: Basic Metabolic Panel:  Lab 01/03/12 0454 01/02/12 0500 01/01/12 0513 12/31/11 0500 12/30/11 0915  NA 135 136 136 138 140  K 3.7 3.7 4.0 3.9 3.3*  CL 97 97 97 98 98  CO2 28 28 30 30  32  GLUCOSE 76 86 87 90 111*  BUN 51* 52* 54* 54* 57*  CREATININE 2.76* 2.61* 2.51* 2.35* 2.37*  CALCIUM 8.5 8.6 8.6 8.8 8.3*  MG -- -- -- -- --  PHOS -- -- -- -- --   CBC:  Lab 01/03/12 0412 01/02/12 2050 01/02/12 0500 01/01/12 0513 12/30/11 0500  WBC 22.6* 24.7* 23.4* 27.4* 32.5*  NEUTROABS -- -- -- -- --  HGB 7.0* 7.1* 7.1* 7.5* 8.3*  HCT 20.5* 21.0* 21.7* 23.1* 25.4*  MCV 86.5 86.1 86.8 86.2 84.7  PLT 146* 134* 137* 156 200    Basename 12/14/11 2102 12/11/11 1330  PROBNP 10792.0* 10471.0*     Studies:  Recent x-ray studies have been reviewed in detail by the Attending Physician  Scheduled Meds:  Reviewed in detail by the Attending Physician  Junious Silk, ANP [facility] Office  [phone] Pager [phone]  On-Call/Text Page:      [email or web address]      password [facility]  01/03/2012, 6:25 PM   LOS: 24 days   I have personally examined this patient and reviewed the entire database. I have reviewed the above note, made any necessary editorial changes, and agree with  its content.  Lonia Blood, MD [facility]

## 2012-01-03 NOTE — Progress Notes (Signed)
OT Cancellation Note  Patient Details Name: [name] MRN: [id number] DOB: [birth year]   Cancelled Treatment:    Reason Eval/Treat Not Completed: Other (comment) (pt declined)  [facility] Lavell Ridings, OTR/L  [phone] 01/03/2012 01/03/2012, 5:17 PM

## 2012-01-04 ENCOUNTER — Inpatient Hospital Stay
Admission: AD | Admit: 2012-01-04 | Discharge: 2012-01-25 | Disposition: A | Discharge status: Home / Self Care | Payer: Self-pay | Source: Ambulatory Visit | Attending: Internal Medicine | Admitting: Internal Medicine

## 2012-01-04 DIAGNOSIS — I2789 Other specified pulmonary heart diseases: Secondary | ICD-10-CM | POA: Diagnosis not present

## 2012-01-04 DIAGNOSIS — I10 Essential (primary) hypertension: Secondary | ICD-10-CM

## 2012-01-04 DIAGNOSIS — I5033 Acute on chronic diastolic (congestive) heart failure: Secondary | ICD-10-CM

## 2012-01-04 DIAGNOSIS — I4891 Unspecified atrial fibrillation: Secondary | ICD-10-CM

## 2012-01-04 DIAGNOSIS — N179 Acute kidney failure, unspecified: Secondary | ICD-10-CM | POA: Diagnosis not present

## 2012-01-04 DIAGNOSIS — M109 Gout, unspecified: Secondary | ICD-10-CM | POA: Diagnosis not present

## 2012-01-04 DIAGNOSIS — I50811 Acute right heart failure: Secondary | ICD-10-CM

## 2012-01-04 DIAGNOSIS — L89109 Pressure ulcer of unspecified part of back, unspecified stage: Secondary | ICD-10-CM | POA: Diagnosis present

## 2012-01-04 DIAGNOSIS — L8993 Pressure ulcer of unspecified site, stage 3: Secondary | ICD-10-CM | POA: Diagnosis present

## 2012-01-04 DIAGNOSIS — Z792 Long term (current) use of antibiotics: Secondary | ICD-10-CM | POA: Diagnosis not present

## 2012-01-04 DIAGNOSIS — I872 Venous insufficiency (chronic) (peripheral): Secondary | ICD-10-CM | POA: Diagnosis present

## 2012-01-04 DIAGNOSIS — E662 Morbid (severe) obesity with alveolar hypoventilation: Secondary | ICD-10-CM | POA: Diagnosis present

## 2012-01-04 DIAGNOSIS — N189 Chronic kidney disease, unspecified: Secondary | ICD-10-CM | POA: Diagnosis present

## 2012-01-04 DIAGNOSIS — M6281 Muscle weakness (generalized): Secondary | ICD-10-CM | POA: Diagnosis not present

## 2012-01-04 DIAGNOSIS — D638 Anemia in other chronic diseases classified elsewhere: Secondary | ICD-10-CM | POA: Diagnosis present

## 2012-01-04 DIAGNOSIS — I509 Heart failure, unspecified: Secondary | ICD-10-CM | POA: Diagnosis not present

## 2012-01-04 DIAGNOSIS — L02419 Cutaneous abscess of limb, unspecified: Secondary | ICD-10-CM | POA: Diagnosis not present

## 2012-01-04 DIAGNOSIS — R609 Edema, unspecified: Secondary | ICD-10-CM | POA: Diagnosis not present

## 2012-01-04 DIAGNOSIS — I501 Left ventricular failure: Secondary | ICD-10-CM | POA: Diagnosis not present

## 2012-01-04 DIAGNOSIS — Z9119 Patient's noncompliance with other medical treatment and regimen: Secondary | ICD-10-CM | POA: Diagnosis not present

## 2012-01-04 DIAGNOSIS — R52 Pain, unspecified: Secondary | ICD-10-CM | POA: Diagnosis present

## 2012-01-04 DIAGNOSIS — L97809 Non-pressure chronic ulcer of other part of unspecified lower leg with unspecified severity: Secondary | ICD-10-CM | POA: Diagnosis present

## 2012-01-04 DIAGNOSIS — Q211 Atrial septal defect: Secondary | ICD-10-CM | POA: Diagnosis not present

## 2012-01-04 DIAGNOSIS — I272 Pulmonary hypertension, unspecified: Secondary | ICD-10-CM

## 2012-01-04 DIAGNOSIS — M6530 Trigger finger, unspecified finger: Secondary | ICD-10-CM | POA: Diagnosis not present

## 2012-01-04 DIAGNOSIS — L03116 Cellulitis of left lower limb: Secondary | ICD-10-CM

## 2012-01-04 DIAGNOSIS — G4733 Obstructive sleep apnea (adult) (pediatric): Secondary | ICD-10-CM | POA: Diagnosis present

## 2012-01-04 DIAGNOSIS — Z6841 Body Mass Index (BMI) 40.0 and over, adult: Secondary | ICD-10-CM | POA: Diagnosis not present

## 2012-01-04 DIAGNOSIS — L259 Unspecified contact dermatitis, unspecified cause: Secondary | ICD-10-CM | POA: Diagnosis not present

## 2012-01-04 HISTORY — DX: Anemia, unspecified: D64.9

## 2012-01-04 LAB — BASIC METABOLIC PANEL
Calcium: 8.8 mg/dL (ref 8.4–10.5)
GFR calc non Af Amer: 20 mL/min — ABNORMAL LOW (ref >90)
Potassium: 3.5 mEq/L (ref 3.5–5.1)
Sodium: 138 mEq/L (ref 135–145)

## 2012-01-04 MED ORDER — RIVAROXABAN 15 MG PO TABS: 15.0000 mg | ORAL_TABLET | Freq: Every day | ORAL | Status: DC

## 2012-01-04 MED ORDER — HYDROCODONE-ACETAMINOPHEN 5-325 MG PO TABS: 1.0000 | ORAL_TABLET | ORAL | Status: DC | PRN

## 2012-01-04 MED ORDER — TORSEMIDE 20 MG PO TABS: 20.0000 mg | ORAL_TABLET | Freq: Every day | ORAL | Status: DC

## 2012-01-04 MED ORDER — SILVER SULFADIAZINE 1 % EX CREA: TOPICAL_CREAM | Freq: Every day | CUTANEOUS | Status: DC

## 2012-01-04 MED ORDER — DIPHENHYDRAMINE HCL 25 MG PO CAPS
25.0000 mg | ORAL_CAPSULE | Freq: Four times a day (QID) | ORAL | Status: DC | PRN
  Administered 2012-01-04: 25 mg via ORAL
  Filled 2012-01-04: qty 1

## 2012-01-04 MED ORDER — ALLOPURINOL 100 MG PO TABS: 100.0000 mg | ORAL_TABLET | Freq: Every day | ORAL | Status: DC

## 2012-01-04 MED ORDER — LEVOFLOXACIN 500 MG PO TABS: 500.0000 mg | ORAL_TABLET | Freq: Every day | ORAL | Status: DC

## 2012-01-04 MED ORDER — ACETAMINOPHEN 325 MG PO TABS: 650.0000 mg | ORAL_TABLET | ORAL | Status: DC | PRN

## 2012-01-04 NOTE — Discharge Summary (Signed)
Physician Discharge Summary  [name] XLK:[id number] DOB: May 08, 1970 DOA: 12/10/2011  PCP: Beverly Smiles., MD  Admit date: 12/10/2011 Discharge date: 01/04/2012  Recommendations for Outpatient Follow-up:  1. Pt will need to follow up with PCP in 2-3 weeks post discharge 2. Please obtain BMP to evaluate electrolytes and kidney function, creatinine at baseline ~ 2.5 3. Pleas note that pt has been discharged on Demadex 20 mg PO daily so please ensure kidney function tests are monitored 4. Weight on discharge 342 lbs but may be inaccurate as the weight 2 days prior to discharge was 301 lbs 5. Please also check CBC to evaluate Hg and Hct levels, Hg on discharge 7.0, baseline Hg in October 2013 was ~11, please also follow up on WBC 6. Please also note that pt has been discharged on Levaquin PO to complete the therapy for 5 more days, stop date 01/09/2012 7. Please also note that SPEP/UPEP/fat pad biopsy/bone marrow negative for amyloid. 8. Please note that [name] was started per cardiology recommendations   Discharge Diagnoses: Acute respiratory failure secondary to acute right heart failure  Principal Problem:  *Acute right heart failure Active Problems:  Anasarca  Infiltrative cardiomyopathy  Accelerated hypertension  Hyperbilirubinemia  Acute gouty arthropathy  Atrial fibrillation with controlled ventricular response  Cellulitis of left leg  Fever  Pulmonary HTN-severe  Acute renal failure  Morbid obesity with BMI of 50.0-59.9, adult  Leukocytosis  Anemia  Discharge Condition: Stable  Diet recommendation: Heart healthy diet discussed in details   History of present illness:  The patient is a morbidly obese 41 yo woman with a h/o HTN, poorly controlled, who was admitted to [facility] with massive volume overload and because of being refractory to diuresis was transfered to [facility] for ongoing therapy. She has a h/o non-compliance. She has massive peripheral edema and has  been obese for many years. She also has a h/o gout with flairs in the past.Also has bilateral LE leg ulcers, thrombocytopenia and leukocytosis. Bm biopsy negative for amyloidosis. R heart cath on 10/30 - RA = 16  RV = 81/14/20  PA = 78/32 (52)  PCW = 21  Fick cardiac output/index = 11.9/4.6  Thermo CO/CI = 9.8/3.8  PVR = 2.6 Woods (Fick)  FA sat = 91%  PA sat = 65%, 72%  SVC sat = 73%  RA sat = 66%  Hospital Course:   Acute right heart failure/Anasarca/known diastolic dysfunction with chronic heart failure  *Care as per HF Team-the heart failure team has recommended that patient continue her rehabilitation at [facility] and family was in agreement with the plan *EF is preserved  *Appreciate hematology evaluation - had elevated free light chains SPEP and UPEP w/ negative fat pad biopsy for amyloid. Had radiographic findings including hepatosplenomegaly concerning for gammaglobulin abnormality or dysfunction.  *She is status post bone marrow biopsy in interventional radiology 12/28/11-preliminary pathology is read by Dr. Arline [name] as not suggestive of amyloid  *Cardiology endorses that abnormal echo findings are explained by combination of severe hypertension and OHS related pulmonary artery hypertension  *Unable to use weight in terms of guidance with diuresis as it appeared to be inaccurate during this hospital stay Pulmonary HTN-severe  *Care as per HF Team  Anemia  *Baseline hemoglobin on 12/10/2011 was 11.3  *Today hemoglobin has drifted down to 7.1 and subsequently has delayed TEE procedure. We'll check CT abdomen and pelvis to rule out retroperitoneal hematoma - the patient currently is asymptomatic i.e. no back pain or abdominal  pain  *Cardiology recommending transfusion which would help support oxygen demand in her setting of high-output cardiac heart failure - pt is now s/p 2U PRBC  *Fe studies were consistent with anemia of chronic disease type picture (all indices low normal to  low)  *TSH only mildly elevated at 4.8  *No evidence for hemolysis  *No evidence for Retroperitoneal hematoma on Ct 01/03/12  *Transfused 1 unit of prbcs - 01/03/12  Accelerated hypertension  *BP control has been variable - follow w/o change today - No ACE inhibitor or ARB secondary to ongoing renal failure  Atrial fibrillation with controlled ventricular response/Early + echocardiogram bubble study  *Has significant left atrial enlargement of unknown duration also has suspected PFO based on bubble study -cardiology suspects the opening of PFO is in the setting of severe PAH  *For TEE/DC-CV -has been rescheduled tentatively for 01/03/2012 if hemoglobin is stable  *Cardiology started Xarelto 12/31/2011  Cellulitis of left leg/Fever/ Leukocytosis  *Doxy switched to Levaquin 10/28 - Levaquin discontinued 12/26/2011 because felt that the lower extremity findings were more related to chronic venous stasis than a true infection and plans were to follow clinically (was not erythematous at that time). Over the subsequent 48 hours patient had increased cellulitic skin changes extending up to the left lateral thigh therefore Levaquin was resumed and vancomycin added  *MRI of left leg w/o evidence of abcess/foci of infection/fasciitis  *dopplers were performed but were inconclusive given patient's body habitus and pain in leg  *exam suggests that the erythema is resolving today -superficial skin is sloughing off with wound care but otherwise wound appears to be slowly improving  *White count is very slowly decreasing and she is not experiencing any fevers  Acute renal failure  *Renal fxn has fluctuated w/ diuresis - appears to be stable w/ crt at ~2.5  Hyperbilirubinemia  *Not obstructive in nature and likely secondary to passive hepatic congestion  Acute gouty arthropathy  *responded well to steroids - now off steroids and on maintenance colchicine w/o flare  Morbid obesity with BMI of 50.0-59.9, adult    *May have underlying OSA/OHS  *Also has striking masculine features - total testosterone level is < 10 making PCOD very unlikely   Procedures:  Skin and soft tissue biopsy of abdominal wall by Dr. Leticia Penna on 12/18/2011  Bone marrow biopsy in interventional radiology on 12/28/1998   Ct Abdomen Pelvis Wo Contrast 01/02/2012    1.  No evidence of retroperitoneal hematoma.  2.  Asymmetric psoas muscles likely within normal limits.  3.  Small amount free fluid in the abdomen and pelvis.  4. Ovaries appear appears slightly enlarged.   5.  Subcutaneous nodule within the right lower quadrant of unclear etiology.   6.  Hepatomegaly.    US Abdomen Complete 12/14/2011   Hepatomegaly and splenomegaly with mild decrease in echotexture suspected diffusely for both organs. 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  Slight increase in renal echotexture bilaterally.  This has been described with amyloidosis deposition but may also be secondary to the known chronic renal disease.  No focal renal abnormalities are seen.  Small amount of ascites.  Thickening of the gallbladder wall with a tri-layered pattern suggesting wall edema.  Given the presence of ascites and clinical history of anasarca, this is likely due to third spacing. In the appropriate clinical setting, this can be an indicator of acute acalculous cholecystitis.   Mr Tibia Fibula Left Wo Contrast 12/28/2011  Diffuse extensive cellulitis of the left lower leg without defined abscesses.  No myositis or osteomyelitis.     Ct Biopsy 12/28/2011 CT guided right iliac bone marrow aspiration and core biopsy.    Dg Chest Port 1 View 12/24/2011   PICC line on the right with the tip as described.     Dg Chest Portable 1 View 12/10/2011 Cardiac enlargement without pulmonary vascular congestion.  Lungs clear.   Consultations:  Cardiology  Oncology  Antibiotics:  Doxycycline 10/21  >>> 10/28   Levaquin 10/28 >>>10/30   Levaquin 12/28/11 >>> continue PO until 11/13  Vancomycin 12/28/11 >>> 11/08  DVT prophylaxis: Xarelto  Code Status: Full  Family Communication: Spoke to patient  Disposition Plan: LTAC today 01/04/2012  Discharge Exam: Filed Vitals:   01/04/12 1028  BP: 122/68  Pulse: 86  Temp: 97.5 F (36.4 C)  Resp: 20   Filed Vitals:   01/03/12 2027 01/03/12 2238 01/04/12 0300 01/04/12 1028  BP: 118/75 122/74 135/65 122/68  Pulse: 85 87 78 86  Temp: 98 F (36.7 C)  98.4 F (36.9 C) 97.5 F (36.4 C)  TempSrc: Oral  Oral Oral  Resp: 20   20  Height:      Weight:   155.402 kg (342 lb 9.6 oz)   SpO2: 94%  98% 99%    General: No acute respiratory distress  Lungs: Clear to auscultation bilaterally without wheezes or crackles - BS distant th/o  Cardiovascular: Regular rate without murmur gallop or rub  Abdomen: Nontender, nondistended, soft, bowel sounds positive, no rebound, no ascites, no appreciable mass - morbidly obese  Musculoskeletal: No significant cyanosis, clubbing of bilateral lower extremities  Skin: decreased erythmea of L LE - several large anterior stasis ulcers with moderate fibrin debris - no obvious fluctuance palpated and no purulent or foul smelling drainage noted-superficial skin sloughing is noted  Neurological: Alert and oriented x 3  Discharge Instructions  Discharge Orders    Future Orders Please Complete By Expires   Diet - low sodium heart healthy      Diet - low sodium heart healthy      Diet - low sodium heart healthy      Increase activity slowly      Increase activity slowly      Increase activity slowly          Medication List     As of 01/04/2012 10:46 AM    STOP taking these medications         ibuprofen 200 MG tablet   Commonly known as: ADVIL,MOTRIN      TAKE these medications         acetaminophen 325 MG tablet   Commonly known as: TYLENOL   Take 2 tablets (650 mg total) by mouth every 4  (four) hours as needed.      allopurinol 100 MG tablet   Commonly known as: ZYLOPRIM   Take 1 tablet (100 mg total) by mouth daily.      amLODipine 10 MG tablet   Commonly known as: NORVASC   Take 1 tablet (10 mg total) by mouth daily.      aspirin 81 MG tablet   Take 81 mg by mouth daily.      colchicine 0.6 MG tablet   Take 1 tablet (0.6 mg total) by mouth daily.      folic acid 1 MG tablet   Commonly known as: FOLVITE   Take 1 tablet (1 mg total) by mouth daily.  HYDROcodone-acetaminophen 5-325 MG per tablet   Commonly known as: NORCO/VICODIN   Take 1 tablet by mouth every 4 (four) hours as needed.      labetalol 200 MG tablet   Commonly known as: NORMODYNE   Take 2 tablets (400 mg total) by mouth 2 (two) times daily.      levofloxacin 500 MG tablet   Commonly known as: LEVAQUIN for 5 more days, stop 01/09/2012   Take 1 tablet (500 mg total) by mouth daily.      pantoprazole 40 MG tablet   Commonly known as: PROTONIX   Take 1 tablet (40 mg total) by mouth daily.      predniSONE 20 MG tablet   Commonly known as: DELTASONE   Take 2 tablets daily for 3 days, then 1 tablet daily for 3 days, then half tablet daily for 3 days, then STOP.      Rivaroxaban 15 MG Tabs tablet   Commonly known as: XARELTO   Take 1 tablet (15 mg total) by mouth daily with supper.      silver sulfADIAZINE 1 % cream   Commonly known as: SILVADENE   Apply topically daily.      torsemide 20 MG tablet   Commonly known as: DEMADEX   Take 1 tablet (20 mg total) by mouth daily.           Follow-up Information    Follow up with Dix Bing, MD. Schedule an appointment as soon as possible for a visit in 1 week.   Contact information:   [address] [place] [address] [phone]       Follow up with Randall An, MD. Schedule an appointment as soon as possible for a visit in 2 weeks.   Contact information:   [address] [place] [address] [phone]        Follow up with Fabio Bering, MD. Schedule an appointment as soon as possible for a visit in 2 weeks.   Contact information:   [address] [place] [address] [phone]           The results of significant diagnostics from this hospitalization (including imaging, microbiology, ancillary and laboratory) are listed below for reference.     Microbiology: Recent Results (from the past 240 hour(s))  CULTURE, BLOOD (ROUTINE X 2)     Status: Normal   Collection Time   12/28/11 10:29 AM      Component Value Range Status Comment   Specimen Description BLOOD LEFT FOREARM   Final    Special Requests BOTTLES DRAWN AEROBIC AND ANAEROBIC 10CC   Final    Culture  Setup Time 12/28/2011 15:31   Final    Culture NO GROWTH 5 DAYS   Final    Report Status 01/03/2012 FINAL   Final   CULTURE, BLOOD (ROUTINE X 2)     Status: Normal   Collection Time   12/28/11 10:37 AM      Component Value Range Status Comment   Specimen Description BLOOD LEFT HAND   Final    Special Requests BOTTLES DRAWN AEROBIC ONLY 10CC   Final    Culture  Setup Time 12/28/2011 15:31   Final    Culture NO GROWTH 5 DAYS   Final    Report Status 01/03/2012 FINAL   Final   URINE CULTURE     Status: Normal   Collection Time   12/28/11 10:43 AM      Component Value Range Status Comment  Specimen Description URINE, CATHETERIZED   Final    Special Requests NONE   Final    Culture  Setup Time 12/28/2011 12:33   Final    Colony Count NO GROWTH   Final    Culture NO GROWTH   Final    Report Status 12/29/2011 FINAL   Final      Labs: Basic Metabolic Panel:  Lab 01/04/12 1610 01/03/12 0412 01/02/12 0500 01/01/12 0513 12/31/11 0500  NA 138 135 136 136 138  K 3.5 3.7 3.7 4.0 3.9  CL 99 97 97 97 98  CO2 30 28 28 30 30   GLUCOSE 75 76 86 87 90  BUN 50* 51* 52* 54* 54*  CREATININE 2.81* 2.76* 2.61* 2.51* 2.35*  CALCIUM 8.8 8.5 8.6 8.6 8.8  MG -- -- -- -- --  PHOS -- -- -- -- --   Liver Function Tests:  Lab  01/02/12 2050  AST --  ALT --  ALKPHOS --  BILITOT 4.6*  PROT --  ALBUMIN --   CBC:  Lab 01/03/12 0412 01/02/12 2050 01/02/12 0500 01/01/12 0513 12/30/11 0500  WBC 22.6* 24.7* 23.4* 27.4* 32.5*  NEUTROABS -- -- -- -- --  HGB 7.0* 7.1* 7.1* 7.5* 8.3*  HCT 20.5* 21.0* 21.7* 23.1* 25.4*  MCV 86.5 86.1 86.8 86.2 84.7  PLT 146* 134* 137* 156 200   BNP (last 3 results)  Basename 12/14/11 2102 12/11/11 1330  PROBNP 10792.0* 10471.0*   SIGNED: Time coordinating discharge: Over 30 minutes  Debbora Presto, MD  [facility] 01/04/2012, 10:46 AM Pager [phone]  If 7PM-7AM, please contact night-coverage [email or web address] Password [email or web address]

## 2012-01-04 NOTE — Progress Notes (Signed)
Patient verbalizes understanding of discharge instructions and is currently being discharged to [facility] ([facility]).  Report given to [facility].  Lorretta Harp RN

## 2012-01-04 NOTE — Progress Notes (Signed)
Occupational Therapy Treatment Patient Details Name: [name] MRN: [id number] DOB: 08/08/1970 Today's Date: 01/04/2012 Time: 4098-1191 OT Time Calculation (min): 17 min  OT Assessment / Plan / Recommendation Comments on Treatment Session Pt performed toilet transfer multiple times to reinforce proper technique and safety.  Reports she may be going to [facility] today.    Follow Up Recommendations  Home health OT    Barriers to Discharge       Equipment Recommendations  Rolling walker with 5" wheels    Recommendations for Other Services    Frequency Min 2X/week   Plan Discharge plan remains appropriate    Precautions / Restrictions Precautions Precautions: Fall Precaution Comments: multiple wounds sacrum and LLE Restrictions Weight Bearing Restrictions: No LLE Weight Bearing: Weight bearing as tolerated   Pertinent Vitals/Pain See vitals    ADL  Eating/Feeding: Performed;Independent Where Assessed - Eating/Feeding: Chair Toilet Transfer: Performed;Min Pension scheme manager Method: Surveyor, minerals: Customer service manager Used: Rolling walker;Gait belt Transfers/Ambulation Related to ADLs: min assist during SPT chair <>3n1 ADL Comments: Focus of session on toilet transfer. Pt performed multiple toilet transfer to practice technique and sequencing.    OT Diagnosis:    OT Problem List:   OT Treatment Interventions:     OT Goals ADL Goals Additional ADL Goal #1: Pt will complete all aspects of toileting with 3:1 over commode and mod I. ADL Goal: Additional Goal #1 - Progress: Progressing toward goals  Visit Information  Last OT Received On: 01/04/12 Assistance Needed: +2 (for safety)    Subjective Data      Prior Functioning  Home Living Lives With: Family;Other (Comment) Available Help at Discharge: Family;Available 24 hours/day Type of Home: Mobile home Home Access: Stairs to enter Entrance Stairs-Number of Steps: 6 Entrance  Stairs-Rails: Right;Left;Can reach both Home Layout: One level Bathroom Shower/Tub: Walk-in shower;Tub only;Door [employer] Toilet: Pharmacist, community: No Home Adaptive Equipment: None Prior Function Level of Independence: Independent Able to Take Stairs?: Yes Driving: Yes Vocation: On disability    Cognition       Mobility  Shoulder Instructions Bed Mobility Bed Mobility: Not assessed Transfers Transfers: Sit to Stand;Stand to Sit Sit to Stand: 4: Min assist;From chair/3-in-1;With upper extremity assist;With armrests Stand to Sit: 4: Min assist;To chair/3-in-1;With upper extremity assist;With armrests Details for Transfer Assistance: Cueing for hand placement and sequencing.       Exercises      Balance     End of Session OT - End of Session Equipment Utilized During Treatment: Gait belt Activity Tolerance: Patient tolerated treatment well Patient left: in chair;with call bell/phone within reach Nurse Communication: Mobility status  GO   01/04/2012 Cipriano Mile OTR/L Pager [phone] Office [phone]   Cipriano Mile 01/04/2012, 4:31 PM

## 2012-01-04 NOTE — Progress Notes (Signed)
Advanced Heart Failure Rounding Note   Subjective:    41 yo with history of HTN, gout, morbid obesity, atrial fibrillation and diastolic heart failure.  Admitted to [facility] on 10/14 for massive volume overload.  Transferred to [facility] on 10/26 due to worsening renal function, weakness and volume overload.   Echo: diastolic heart failure with ejection fraction of 55-60%, severe LVH, Mildly dilated RV.  Mildly to mod dilated RA.  moderate pericardial effusion and echocardiographic changes consistent with a possible infiltrative disease.  PAPP 72 mmHg  Milrinone stopped 10/30 after RHC c/w high output HF.   RA = 16  RV = 81/14/20  PA = 78/32 (52)  PCW = 21  Fick cardiac output/index = 11.9/4.6  Thermo CO/CI = 9.8/3.8  PVR = 2.6 Woods (Fick)  FA sat = 91%  PA sat = 65%, 72%  SVC sat = 73%  RA sat = 66%  SPEP/UPEP/fat pad biopsy/bone marrow negative for amyloid. Echo findings thought to be combination of severe HTN and PAH related to obesity-hypoventilation syndrome. Bubble study +. Has diuresed ~100 pounds while in house. Cr seems to be new baseline ~2.5.   MRI of LE which showed diffuse cellulitis of LLE. No osteo or abscess. Antibiotics expanded.   We were planning for TEE and DC-CV to assess for shunt and also cardiovert but procedure deferred due to dropping hemoglobin. Continues on Xarelto 20 mg.   Weight down total of 104 pounds.   Renal function was worse yesterday so demadex cut back to 20 daily. Weight inaccurate today.  Complains of L leg pain. Denies SOB/PND. No CBC drawn today. Awaiting LTAC placement.    Objective:    Vital Signs:   Temp:  [98 F (36.7 C)-98.4 F (36.9 C)] 98.4 F (36.9 C) (11/08 0300) Pulse Rate:  [78-95] 78  (11/08 0300) Resp:  [20] 20  (11/07 2027) BP: (112-137)/(56-90) 135/65 mmHg (11/08 0300) SpO2:  [94 %-100 %] 98 % (11/08 0300) Weight:  [155.402 kg (342 lb 9.6 oz)] 155.402 kg (342 lb 9.6 oz) (11/08 0300) Last BM Date: 01/03/12  Weight  change: Filed Weights   01/01/12 1200 01/04/12 0300  Weight: 136.9 kg (301 lb 13 oz) 155.402 kg (342 lb 9.6 oz)    Intake/Output:   Intake/Output Summary (Last 24 hours) at 01/04/12 1023 Last data filed at 01/04/12 0847  Gross per 24 hour  Intake   1090 ml  Output   2101 ml  Net  -1011 ml     Physical Exam: General:  Obese, well appearing. No resp difficulty. Sister at bedside HEENT: normal Neck: supple.  Carotids 2+ bilat; no bruits. No lymphadenopathy or thryomegaly appreciated. Cor: PMI nondisplaced. + probable RV lift Regular rate & rhythm. No rubs, gallops or murmurs. Lungs: clear anteriorl Abdomen: obese, soft, nontender, nondistended. Good bowel sounds. Extremities: no cyanosis, clubbing, rash, minimal edema to thigh. L leg erythema and painful to touch dressings in place to LLE.Cellulitis/lymphedema on L. RUE PICC. Large gouty nodule R elbow Neuro: alert & orientedx3, cranial nerves grossly intact. moves all 4 extremities w/o difficulty. Affect pleasant  Telemetry: AF 90-100  Labs: Basic Metabolic Panel:  Lab 01/04/12 4098 01/03/12 0412 01/02/12 0500 01/01/12 0513 12/31/11 0500  NA 138 135 136 136 138  K 3.5 3.7 3.7 4.0 3.9  CL 99 97 97 97 98  CO2 30 28 28 30 30   GLUCOSE 75 76 86 87 90  BUN 50* 51* 52* 54* 54*  CREATININE 2.81* 2.76* 2.61* 2.51* 2.35*  CALCIUM 8.8 8.5 8.6 -- --  MG -- -- -- -- --  PHOS -- -- -- -- --    Liver Function Tests:  Lab 01/02/12 2050  AST --  ALT --  ALKPHOS --  BILITOT 4.6*  PROT --  ALBUMIN --   No results found for this basename: LIPASE:5,AMYLASE:5 in the last 168 hours No results found for this basename: AMMONIA:3 in the last 168 hours  CBC:  Lab 01/03/12 0412 01/02/12 2050 01/02/12 0500 01/01/12 0513 12/30/11 0500  WBC 22.6* 24.7* 23.4* 27.4* 32.5*  NEUTROABS -- -- -- -- --  HGB 7.0* 7.1* 7.1* 7.5* 8.3*  HCT 20.5* 21.0* 21.7* 23.1* 25.4*  MCV 86.5 86.1 86.8 86.2 84.7  PLT 146* 134* 137* 156 200    Cardiac  Enzymes:  Lab 12/28/11 1029  CKTOTAL 17  CKMB --  CKMBINDEX --  TROPONINI --    BNP: BNP (last 3 results)  Basename 12/14/11 2102 12/11/11 1330  PROBNP 10792.0* 10471.0*       Medications:     Scheduled Medications:    . allopurinol  100 mg Oral Daily  . alteplase  2 mg Intracatheter Once  . amLODipine  10 mg Oral Daily  . aspirin  81 mg Oral Daily  . colchicine  0.6 mg Oral Daily  . darbepoetin (ARANESP) injection - NON-DIALYSIS  150 mcg Subcutaneous Q Thu-1800  . folic acid  1 mg Oral Daily  . [COMPLETED] furosemide  40 mg Intravenous Once  . labetalol  400 mg Oral BID  . levofloxacin (LEVAQUIN) IV  750 mg Intravenous Q48H  . pantoprazole  40 mg Oral Daily  . rivaroxaban  15 mg Oral Q supper  . silver sulfADIAZINE   Topical Daily  . sodium chloride  10-40 mL Intracatheter Q12H  . torsemide  20 mg Oral Daily  . vancomycin  1,000 mg Intravenous Q24H    Infusions:    . sodium chloride 20 mL/hr at 12/31/11 1944  . sodium chloride Stopped (01/02/12 1023)  . sodium chloride 250 mL (01/03/12 1028)    PRN Medications: sodium chloride, acetaminophen, hydrALAZINE, morphine injection, ondansetron (ZOFRAN) IV, oxyCODONE-acetaminophen, promethazine, sodium chloride   Assessment:   1. Acute on chronic diastolic HF 2. 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 Atrial fibrillation, chronic 4. Morbid obesity, BMI 54.8  5. HTN, uncontrolled 6. Acute on chronic renal failure (baseline Cr 1.5) 7. Cellulitis 8. Anemia  Plan/Discussion:    She is doing well. Weight inaccurate. Volume status looks good. Renal function on demadex 20 daily. Would continue.  Will plan TEE & DC-CV in several weeks if Hgb stable on apixaban.   I discussed LTAC placement with patient and her sister [name] (by phone) and they prefer [facility].   She is stable to go there today from my standpoint. I d/w Dr. Runell Gess.   We will follow her in [facility].   Length of Stay:  25   Beverly Brumbach,MD 10:23 AM

## 2012-01-04 NOTE — Progress Notes (Signed)
Physical Therapy Treatment Patient Details Name: [name] MRN: [id number] DOB: 03/10/1970 Today's Date: 01/04/2012 Time: 1191-4782 PT Time Calculation (min): 14 min  PT Assessment / Plan / Recommendation Comments on Treatment Session  Pt with heart failure and LLE cellulitis with slowly improving gait. Pt unable to tolerate further activity after gait due to increased pain LLE despite premedication. Pt encouraged to continue mobility and HEP as able. Will continue to follow.     Follow Up Recommendations        Does the patient have the potential to tolerate intense rehabilitation     Barriers to Discharge        Equipment Recommendations       Recommendations for Other Services    Frequency     Plan Discharge plan remains appropriate;Frequency remains appropriate    Precautions / Restrictions Precautions Precautions: Fall Precaution Comments: multiple wounds sacrum and LLE   Pertinent Vitals/Pain Pt reported 6/10 pain with transfers and up to 10/10 with gait with activity ceased and RN aware    Mobility  Bed Mobility Bed Mobility: Supine to Sit Supine to Sit: 4: Min assist;HOB elevated;With rails (HOB 20degrees) Sitting - Scoot to Edge of Bed: 5: Supervision Details for Bed Mobility Assistance: assist to hold LLE for transfer OOB and slowly lower to the floor with transfer to EOB with increased time to complete Transfers Transfers: Sit to Stand;Stand to Sit Sit to Stand: 4: Min assist;From bed;From elevated surface Stand to Sit: 4: Min assist;To chair/3-in-1;With armrests Details for Transfer Assistance: cueing for hand placement, sequence, safety Ambulation/Gait Ambulation/Gait Assistance: 4: Min assist (+1 to follow with chair) Ambulation Distance (Feet): 30 Feet Assistive device: Rolling walker Ambulation/Gait Assistance Details: cueing for posture and position in RW. Pt with one posterior LOB with gait with assist for stepping into RW and maintaining  balance Gait Pattern: Step-to pattern;Wide base of support;Trunk flexed Gait velocity: decreased Stairs: No    Exercises     PT Diagnosis:    PT Problem List:   PT Treatment Interventions:     PT Goals Acute Rehab PT Goals PT Goal: Supine/Side to Sit - Progress: Progressing toward goal PT Goal: Sit to Stand - Progress: Progressing toward goal Pt will Ambulate: 51 - 150 feet;with min assist;with rolling walker PT Goal: Ambulate - Progress: Revised due to lack of progress PT Goal: Up/Down Stairs - Progress: Discontinued (comment) (pt not mobilizing well enough for this goal yet) PT Goal: Perform Home Exercise Program - Progress: Not progressing  Visit Information  Last PT Received On: 01/04/12 Assistance Needed: +2 (chair for safety)    Subjective Data  Subjective: I can't do any more my leg hurts too bad   Cognition  Overall Cognitive Status: Impaired Area of Impairment: Attention Arousal/Alertness: Awake/alert Orientation Level: Appears intact for tasks assessed Behavior During Session: Anxious Current Attention Level: Selective    Balance     End of Session PT - End of Session Equipment Utilized During Treatment: Gait belt Activity Tolerance: Patient limited by pain Patient left: in chair;with call bell/phone within reach Nurse Communication: Mobility status   GP     Toney Sang Beth 01/04/2012, 12:25 PM Delaney Meigs, PT [phone]

## 2012-01-05 LAB — CBC WITH DIFFERENTIAL/PLATELET
Basophils Relative: 0 % (ref 0–1)
Eosinophils Absolute: 1.4 10*3/uL — ABNORMAL HIGH (ref 0.0–0.7)
Hemoglobin: 7.4 g/dL — ABNORMAL LOW (ref 12.0–15.0)
MCH: 28 pg (ref 26.0–34.0)
MCHC: 32.9 g/dL (ref 30.0–36.0)
Monocytes Absolute: 1.2 10*3/uL — ABNORMAL HIGH (ref 0.1–1.0)
Monocytes Relative: 6 % (ref 3–12)
Neutrophils Relative %: 79 % — ABNORMAL HIGH (ref 43–77)

## 2012-01-05 LAB — COMPREHENSIVE METABOLIC PANEL
ALT: 11 U/L (ref 0–35)
Albumin: 2.2 g/dL — ABNORMAL LOW (ref 3.5–5.2)
Alkaline Phosphatase: 158 U/L — ABNORMAL HIGH (ref 39–117)
BUN: 49 mg/dL — ABNORMAL HIGH (ref 6–23)
Creatinine, Ser: 2.71 mg/dL — ABNORMAL HIGH (ref 0.50–1.10)
GFR calc Af Amer: 24 mL/min — ABNORMAL LOW (ref >90)
GFR calc non Af Amer: 21 mL/min — ABNORMAL LOW (ref >90)
Potassium: 3.5 mEq/L (ref 3.5–5.1)
Total Bilirubin: 4 mg/dL — ABNORMAL HIGH (ref 0.3–1.2)
Total Protein: 7.3 g/dL (ref 6.0–8.3)

## 2012-01-05 LAB — TSH: TSH: 14.782 u[IU]/mL — ABNORMAL HIGH (ref 0.350–4.500)

## 2012-01-05 LAB — PREALBUMIN: Prealbumin: 4.4 mg/dL — ABNORMAL LOW (ref 17.0–34.0)

## 2012-01-05 LAB — VANCOMYCIN, TROUGH: Vancomycin Tr: 20.5 ug/mL — ABNORMAL HIGH (ref 10.0–20.0)

## 2012-01-05 LAB — PREPARE RBC (CROSSMATCH)

## 2012-01-06 LAB — TYPE AND SCREEN
ABO/RH(D): O POS
Unit division: 0
Unit division: 0

## 2012-01-06 LAB — HEMOGLOBIN AND HEMATOCRIT, BLOOD
HCT: 23.4 % — ABNORMAL LOW (ref 36.0–46.0)
Hemoglobin: 7.8 g/dL — ABNORMAL LOW (ref 12.0–15.0)

## 2012-01-07 LAB — BASIC METABOLIC PANEL
BUN: 46 mg/dL — ABNORMAL HIGH (ref 6–23)
Creatinine, Ser: 2.51 mg/dL — ABNORMAL HIGH (ref 0.50–1.10)
GFR calc Af Amer: 27 mL/min — ABNORMAL LOW (ref >90)
GFR calc non Af Amer: 23 mL/min — ABNORMAL LOW (ref >90)
Potassium: 3.4 mEq/L — ABNORMAL LOW (ref 3.5–5.1)

## 2012-01-07 LAB — HEPARIN INDUCED THROMBOCYTOPENIA PNL
Patient O.D.: 0.244
UFH SRA Result: NEGATIVE

## 2012-01-07 LAB — HEMOGLOBIN AND HEMATOCRIT, BLOOD: Hemoglobin: 7.6 g/dL — ABNORMAL LOW (ref 12.0–15.0)

## 2012-01-07 NOTE — Progress Notes (Signed)
Advanced Heart Failure Rounding Note   Subjective:    41 yo with history of HTN, gout, morbid obesity, atrial fibrillation and diastolic heart failure.  Admitted to [facility] on 10/14 for massive volume overload.  Transferred to [facility] on 10/26 due to worsening renal function, weakness and volume overload.   Echo: diastolic heart failure with ejection fraction of 55-60%, severe LVH, Mildly dilated RV.  Mildly to mod dilated RA.  moderate pericardial effusion and echocardiographic changes consistent with a possible infiltrative disease.  PAPP 72 mmHg  Milrinone stopped 10/30 after RHC c/w high output HF.   RA = 16  RV = 81/14/20  PA = 78/32 (52)  PCW = 21  Fick cardiac output/index = 11.9/4.6  Thermo CO/CI = 9.8/3.8  PVR = 2.6 Woods (Fick)  FA sat = 91%  PA sat = 65%, 72%  SVC sat = 73%  RA sat = 66%  SPEP/UPEP/fat pad biopsy/bone marrow negative for amyloid. Echo findings thought to be combination of severe HTN and PAH related to obesity-hypoventilation syndrome. Bubble study +. Has diuresed ~100 pounds while in house. Cr seems to be new baseline ~2.5.   MRI of LE which showed diffuse cellulitis of LLE. No osteo or abscess. Antibiotics expanded.   We were planning for TEE and DC-CV to assess for shunt and also cardiovert but procedure deferred due to dropping hemoglobin. Continues on Xarelto 20 mg.   Weight down total of 104 pounds.   Had epistaxis on Saturday night and Xarelto held. Given 1 u RBC with no bump in hgb. Still having pain in left leg. Hasn't walked since Friday as no PT services offered. + Pain in left leg. Remains in AF - heart rate 80-100 on tele. Denies HF symptoms. Renal function stable.    Objective:    Vital Signs:        Weight change: There were no vitals filed for this visit.  Intake/Output:  No intake or output data in the 24 hours ending 01/07/12 0846   Physical Exam: General:  Obese, well appearing. No resp difficulty. Sister at bedside HEENT:  normal Neck: supple.  Carotids 2+ bilat; no bruits. No lymphadenopathy or thryomegaly appreciated. Cor: PMI nondisplaced. + probable RV lift Regular rate & rhythm. No rubs, gallops or murmurs. Lungs: clear anteriorl Abdomen: obese, soft, nontender, nondistended. Good bowel sounds. Extremities: no cyanosis, clubbing, rash, no  edema on R. L leg edema and erythema and painful to touch dressings in place to LLE.Marland Kitchen RUE PICC. Large gouty nodule R elbow Neuro: alert & orientedx3, cranial nerves grossly intact. moves all 4 extremities w/o difficulty. Affect pleasant  Telemetry: AF 90-100  Labs: Basic Metabolic Panel:  Lab 01/07/12 1610 01/05/12 0540 01/04/12 0455 01/03/12 0412 01/02/12 0500  NA 137 137 138 135 136  K 3.4* 3.5 3.5 3.7 3.7  CL 98 97 99 97 97  CO2 28 31 30 28 28   GLUCOSE 69* 83 75 76 86  BUN 46* 49* 50* 51* 52*  CREATININE 2.51* 2.71* 2.81* 2.76* 2.61*  CALCIUM 8.7 8.7 8.8 -- --  MG -- 1.5 -- -- --  PHOS -- 3.9 -- -- --    Liver Function Tests:  Lab 01/05/12 0540 01/02/12 2050  AST 20 --  ALT 11 --  ALKPHOS 158* --  BILITOT 4.0* 4.6*  PROT 7.3 --  ALBUMIN 2.2* --   No results found for this basename: LIPASE:5,AMYLASE:5 in the last 168 hours No results found for this basename: AMMONIA:3 in the last  168 hours  CBC:  Lab 01/07/12 0400 01/06/12 1025 01/05/12 0540 01/03/12 0412 01/02/12 2050 01/02/12 0500 01/01/12 0513  WBC -- -- 19.7* 22.6* 24.7* 23.4* 27.4*  NEUTROABS -- -- 15.5* -- -- -- --  HGB 7.6* 7.8* 7.4* 7.0* 7.1* -- --  HCT 23.6* 23.4* 22.5* 20.5* 21.0* -- --  MCV -- -- 85.2 86.5 86.1 86.8 86.2  PLT -- -- 171 146* 134* 137* 156    Cardiac Enzymes: No results found for this basename: CKTOTAL:5,CKMB:5,CKMBINDEX:5,TROPONINI:5 in the last 168 hours  BNP: BNP (last 3 results)  Basename 12/14/11 2102 12/11/11 1330  PROBNP 10792.0* 10471.0*       Medications:     Scheduled Medications:     Infusions:     PRN Medications:       Assessment:   1. Acute on chronic diastolic HF 2. 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 Atrial fibrillation, chronic 4. Morbid obesity, BMI 54.8  5. HTN, uncontrolled 6. Acute on chronic renal failure (baseline Cr 1.5) 7. Cellulitis 8. Anemia  Plan/Discussion:    She is doing well from HF perspective. Volume status and renal function stable. Weight inaccurate. Continue demadex 20 daily.  Would resume apixaban and watch closely for recurrent epistaxis. May benefit from repeat transfusion.  Will plan TEE & DC-CV in 1-2 weeks if Hgb stable on apixaban.   If leg infection does not continue to improve would consider having ID see. Was ambulating with PT while on 4700. Would resume this, if possible.   Length of Stay: 3   Daniel Bensimhon,MD 8:46 AM

## 2012-01-08 ENCOUNTER — Encounter: Payer: Self-pay | Admitting: Oncology

## 2012-01-08 DIAGNOSIS — R609 Edema, unspecified: Secondary | ICD-10-CM

## 2012-01-08 LAB — TYPE AND SCREEN
ABO/RH(D): O POS
Unit division: 0
Unit division: 0

## 2012-01-08 LAB — BASIC METABOLIC PANEL
BUN: 43 mg/dL — ABNORMAL HIGH (ref 6–23)
CO2: 28 mEq/L (ref 19–32)
Glucose, Bld: 81 mg/dL (ref 70–99)
Potassium: 3.8 mEq/L (ref 3.5–5.1)
Sodium: 140 mEq/L (ref 135–145)

## 2012-01-08 NOTE — Progress Notes (Signed)
*  PRELIMINARY RESULTS* Vascular Ultrasound Lower extremity venous duplex has been completed.  Preliminary findings: Right= no evidence of DVT. Left= due to edema, could not clearly visualize all vein segments. No obvious evidence of DVT in visualized veins.  Farrel Demark, RDMS, RVT  01/08/2012, 10:46 AM

## 2012-01-09 ENCOUNTER — Telehealth: Payer: Self-pay | Admitting: *Deleted

## 2012-01-09 DIAGNOSIS — N179 Acute kidney failure, unspecified: Secondary | ICD-10-CM

## 2012-01-09 NOTE — Progress Notes (Signed)
Advanced Heart Failure Rounding Note   Subjective:    41 yo with history of HTN, gout, morbid obesity, atrial fibrillation and diastolic heart failure.  Admitted to [facility] on 10/14 for massive volume overload.  Transferred to [facility] on 10/26 due to worsening renal function, weakness and volume overload.   Echo: diastolic heart failure with ejection fraction of 55-60%, severe LVH, Mildly dilated RV.  Mildly to mod dilated RA.  moderate pericardial effusion and echocardiographic changes consistent with a possible infiltrative disease.  PAPP 72 mmHg  Milrinone stopped 10/30 after RHC c/w high output HF.   RA = 16  RV = 81/14/20  PA = 78/32 (52)  PCW = 21  Fick cardiac output/index = 11.9/4.6  Thermo CO/CI = 9.8/3.8  PVR = 2.6 Woods (Fick)  FA sat = 91%  PA sat = 65%, 72%  SVC sat = 73%  RA sat = 66%  SPEP/UPEP/fat pad biopsy/bone marrow negative for amyloid. Echo findings thought to be combination of severe HTN and PAH related to obesity-hypoventilation syndrome. Bubble study +. Has diuresed ~100 pounds while in house. Cr seems to be new baseline ~2.5.   MRI of LE which showed diffuse cellulitis of LLE. No osteo or abscess. Antibiotics expanded.   We were planning for TEE and DC-CV to assess for shunt and also cardiovert but procedure deferred due to dropping hemoglobin. Continues on Xarelto 20 mg.   Weight on admit to [facility] 405 pounds, transferred to [facility] at 388 pounds.  Had epistaxis on Saturday night and Xarelto held. Given 1 u RBC.  Hgb stable 8.6.  Weight down to 260 pounds.  Cr stable 2.42.  Wound care changed dressing yesterday.  Still with leg pain.  Ambulated to hall way and back yesterday with PT.  No dyspnea/orthopnea/PND.      Objective:     Physical Exam: General:  Obese, well appearing. No resp difficulty. Sister at bedside HEENT: normal Neck: supple.  Carotids 2+ bilat; no bruits. No lymphadenopathy or thryomegaly appreciated. Cor: PMI nondisplaced. irregular. No rubs,  gallops or murmurs. Lungs: clear anteriorly Abdomen: obese, soft, nontender, nondistended. Good bowel sounds. Extremities: no cyanosis, clubbing, rash, no  edema on R. L  Leg wrapped with ACE. + edema RUE PICC. Large gouty nodule R elbow and digits Neuro: alert & orientedx3, cranial nerves grossly intact. moves all 4 extremities w/o difficulty. Affect pleasant  Telemetry: AF 90-100  Labs: Basic Metabolic Panel:  Lab 01/08/12 1610 01/07/12 0400 01/05/12 0540 01/04/12 0455 01/03/12 0412  NA 140 137 137 138 135  K 3.8 3.4* 3.5 3.5 3.7  CL 101 98 97 99 97  CO2 28 28 31 30 28   GLUCOSE 81 69* 83 75 76  BUN 43* 46* 49* 50* 51*  CREATININE 2.42* 2.51* 2.71* 2.81* 2.76*  CALCIUM 8.8 8.7 8.7 -- --  MG -- -- 1.5 -- --  PHOS -- -- 3.9 -- --    Liver Function Tests:  Lab 01/05/12 0540 01/02/12 2050  AST 20 --  ALT 11 --  ALKPHOS 158* --  BILITOT 4.0* 4.6*  PROT 7.3 --  ALBUMIN 2.2* --   CBC:  Lab 01/08/12 0455 01/07/12 0400 01/06/12 1025 01/05/12 0540 01/03/12 0412 01/02/12 2050  WBC -- -- -- 19.7* 22.6* 24.7*  NEUTROABS -- -- -- 15.5* -- --  HGB 8.6* 7.6* 7.8* 7.4* 7.0* --  HCT 26.3* 23.6* 23.4* 22.5* 20.5* --  MCV -- -- -- 85.2 86.5 86.1  PLT -- -- -- 171 146*  134*   BNP (last 3 results)  Basename 12/14/11 2102 12/11/11 1330  PROBNP 10792.0* 10471.0*      Assessment:   1. Acute on chronic diastolic HF 2. 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 Atrial fibrillation, chronic 4. Morbid obesity, BMI 54.8  5. HTN, uncontrolled 6. Acute on chronic renal failure (baseline Cr 1.5) 7. Cellulitis 8. Anemia  Plan/Discussion:    She is doing well from HF perspective. Volume status and renal function look good.  Will continue demadex 20 mg daily.  May need to cut back at some point. We will follow.   Apixaban has been restarted and Hgb stable.  LikelyTEE & DC-CV next week.  She has completed a 12 day course of antibiotics for leg infection. Given severity  of infection will likely need prolonged course. Would ask ID for their thoughts  Continue to work with PT.    Will continue to follow every couple of days.  Call with questions in the interim.   Please use standing scale to weigh her whenever possible.   Length of Stay: 5 Daniel Bensimhon,MD 4:06 PM

## 2012-01-09 NOTE — Telephone Encounter (Signed)
Patient was scheduled for a post hospital visit for tomorrow, however remains in [facility].  They were unaware of appointment, therefore cancelled appt and left a message for her case worker, Marcelino Duster @ [phone] to schedule her a follow up visit once she is discharged.

## 2012-01-10 ENCOUNTER — Encounter: Payer: Medicare Other | Admitting: Adult Health

## 2012-01-11 LAB — BASIC METABOLIC PANEL
CO2: 28 mEq/L (ref 19–32)
Chloride: 100 mEq/L (ref 96–112)
Creatinine, Ser: 2.22 mg/dL — ABNORMAL HIGH (ref 0.50–1.10)
Glucose, Bld: 90 mg/dL (ref 70–99)
Potassium: 3.9 mEq/L (ref 3.5–5.1)
Sodium: 139 mEq/L (ref 135–145)

## 2012-01-11 LAB — HEMOGLOBIN AND HEMATOCRIT, BLOOD
HCT: 27.4 % — ABNORMAL LOW (ref 36.0–46.0)
Hemoglobin: 8.8 g/dL — ABNORMAL LOW (ref 12.0–15.0)

## 2012-01-11 LAB — CULTURE, BLOOD (ROUTINE X 2): Culture: NO GROWTH

## 2012-01-11 NOTE — Progress Notes (Signed)
Advanced Heart Failure Rounding Note   Subjective:    41 yo with history of HTN, gout, morbid obesity, atrial fibrillation and diastolic heart failure.  Admitted to [facility] on 10/14 for massive volume overload.  Transferred to [facility] on 10/26 due to worsening renal function, weakness and volume overload.   Echo: diastolic heart failure with ejection fraction of 55-60%, severe LVH, Mildly dilated RV.  Mildly to mod dilated RA.  moderate pericardial effusion and echocardiographic changes consistent with a possible infiltrative disease.  PAPP 72 mmHg  Milrinone stopped 10/30 after RHC c/w high output HF.   RA = 16  RV = 81/14/20  PA = 78/32 (52)  PCW = 21  Fick cardiac output/index = 11.9/4.6  Thermo CO/CI = 9.8/3.8  PVR = 2.6 Woods (Fick)  FA sat = 91%  PA sat = 65%, 72%  SVC sat = 73%  RA sat = 66%  SPEP/UPEP/fat pad biopsy/bone marrow negative for amyloid. Echo findings thought to be combination of severe HTN and PAH related to obesity-hypoventilation syndrome. Bubble study +. Has diuresed ~100 pounds while in house. Cr seems to be new baseline ~2.5.   MRI of LE which showed diffuse cellulitis of LLE. No osteo or abscess. Antibiotics expanded.   We were planning for TEE and DC-CV to assess for shunt and also cardiovert but procedure deferred due to dropping hemoglobin. Continues on Xarelto 20 mg.     Doing great. Walking with PT. Leg still sore. Getting wrapped with ACE bandages. No further epistaxis with Xarelto.  Hgb stable 8.6.  Weight down to 260 pounds.  Cr improved 2.2. No dyspnea/orthopnea/PND.   Labetalol increased for HTN this am.   Objective:     Physical Exam: General:  Obese, well appearing. No resp difficulty. Sister at bedside HEENT: normal Neck: supple.  Carotids 2+ bilat; no bruits. No lymphadenopathy or thryomegaly appreciated. Cor: PMI nondisplaced. irregular. No rubs, gallops or murmurs. Lungs: clear anteriorly Abdomen: obese, soft, nontender, nondistended. Good  bowel sounds. Extremities: no cyanosis, clubbing, rash, no  edema on R. L  Leg wrapped with ACE. + edema RUE PICC. Large gouty nodule R elbow and digits Neuro: alert & orientedx3, cranial nerves grossly intact. moves all 4 extremities w/o difficulty. Affect pleasant  Telemetry: AF 90-100  Labs: Basic Metabolic Panel:  Lab 01/11/12 9604 01/08/12 0455 01/07/12 0400 01/05/12 0540  NA 139 140 137 137  K 3.9 3.8 3.4* 3.5  CL 100 101 98 97  CO2 28 28 28 31   GLUCOSE 90 81 69* 83  BUN 52* 43* 46* 49*  CREATININE 2.22* 2.42* 2.51* 2.71*  CALCIUM 9.4 8.8 8.7 --  MG -- -- -- 1.5  PHOS -- -- -- 3.9    Liver Function Tests:  Lab 01/05/12 0540  AST 20  ALT 11  ALKPHOS 158*  BILITOT 4.0*  PROT 7.3  ALBUMIN 2.2*   CBC:  Lab 01/11/12 0500 01/08/12 0455 01/07/12 0400 01/06/12 1025 01/05/12 0540  WBC -- -- -- -- 19.7*  NEUTROABS -- -- -- -- 15.5*  HGB 8.8* 8.6* 7.6* 7.8* 7.4*  HCT 27.4* 26.3* 23.6* 23.4* 22.5*  MCV -- -- -- -- 85.2  PLT -- -- -- -- 171   BNP (last 3 results)  Basename 12/14/11 2102 12/11/11 1330  PROBNP 10792.0* 10471.0*      Assessment:   1. Acute on chronic diastolic HF 2. 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 Atrial fibrillation, chronic 4. Morbid obesity, BMI 54.8  5. HTN, uncontrolled 6. Acute on chronic renal failure (baseline Cr 1.5) 7. Cellulitis 8. Anemia  Plan/Discussion:    She is doing great from HF perspective. Volume status and renal function look good.  Will continue demadex 20 mg daily.   Apixaban has been restarted and Hgb stable.  PlaneTEE & DC-CV next Monday or Tuesday.   Agree with increasing labetalol for HTN  Continue to work with PT.    Will continue to follow every couple of days.  Call with questions in the interim.   Please use standing scale to weigh her whenever possible.   Length of Stay: 7 Courtland Coppa,MD 1:32 PM

## 2012-01-14 LAB — BASIC METABOLIC PANEL
CO2: 30 mEq/L (ref 19–32)
Calcium: 9.5 mg/dL (ref 8.4–10.5)
Chloride: 100 mEq/L (ref 96–112)
Creatinine, Ser: 1.73 mg/dL — ABNORMAL HIGH (ref 0.50–1.10)
Glucose, Bld: 81 mg/dL (ref 70–99)

## 2012-01-14 LAB — CBC
HCT: 27.8 % — ABNORMAL LOW (ref 36.0–46.0)
Hemoglobin: 9.2 g/dL — ABNORMAL LOW (ref 12.0–15.0)
MCH: 29.9 pg (ref 26.0–34.0)
MCV: 90.3 fL (ref 78.0–100.0)
Platelets: 333 10*3/uL (ref 150–400)
RBC: 3.08 MIL/uL — ABNORMAL LOW (ref 3.87–5.11)
WBC: 12.7 10*3/uL — ABNORMAL HIGH (ref 4.0–10.5)

## 2012-01-14 NOTE — Progress Notes (Signed)
Advanced Heart Failure Rounding Note   Subjective:    41 yo with history of HTN, gout, morbid obesity, atrial fibrillation and diastolic heart failure.  Admitted to [facility] on 10/14 for massive volume overload.  Transferred to [facility] on 10/26 due to worsening renal function, weakness and volume overload.   Echo: diastolic heart failure with ejection fraction of 55-60%, severe LVH, Mildly dilated RV.  Mildly to mod dilated RA.  moderate pericardial effusion and echocardiographic changes consistent with a possible infiltrative disease.  PAPP 72 mmHg  Milrinone stopped 10/30 after RHC c/w high output HF.   RA = 16  RV = 81/14/20  PA = 78/32 (52)  PCW = 21  Fick cardiac output/index = 11.9/4.6  Thermo CO/CI = 9.8/3.8  PVR = 2.6 Woods (Fick)  FA sat = 91%  PA sat = 65%, 72%  SVC sat = 73%  RA sat = 66%  SPEP/UPEP/fat pad biopsy/bone marrow negative for amyloid. Echo findings thought to be combination of severe HTN and PAH related to obesity-hypoventilation syndrome. Bubble study +. Has diuresed ~100 pounds while in house. Cr seems to be new baseline ~2.5. MRI of LE which showed diffuse cellulitis of LLE. No osteo or abscess. Antibiotics expanded.   Continues to do well. Volume status appears stable. (no weight on chart x 2 days)  Walking with PT. Leg still sore. Getting wrapped with ACE bandages. No further epistaxis with Xarelto. Hgb improving. Cr down to 1.7  BP remains quite high 170/90.  Last week labetalol increased to 400 bid. Then switched to carvedilol 12.5 bid. Carvedilol increased to 25 bid today.    Objective:     Physical Exam: General:  Obese, well appearing. No resp difficulty.  HEENT: normal Neck: supple.  Carotids 2+ bilat; no bruits. No lymphadenopathy or thryomegaly appreciated. Cor: PMI nondisplaced. irregular. No rubs, gallops or murmurs. Lungs: clear anteriorly Abdomen: obese, soft, nontender, nondistended. Good bowel sounds. Extremities: no cyanosis, clubbing, rash,  no  edema on R. L  Leg wrapped with ACE. + edema RUE PICC. Large gouty nodule R elbow and digits Neuro: alert & orientedx3, cranial nerves grossly intact. moves all 4 extremities w/o difficulty. Affect pleasant  Telemetry: AF 90  Labs: Basic Metabolic Panel:  Lab 01/14/12 0750 01/11/12 0500 01/08/12 0455  NA 140 139 140  K 3.7 3.9 3.8  CL 100 100 101  CO2 30 28 28  GLUCOSE 81 90 81  BUN 44* 52* 43*  CREATININE 1.73* 2.22* 2.42*  CALCIUM 9.5 9.4 8.8  MG -- -- --  PHOS -- -- --    Liver Function Tests: No results found for this basename: AST:5,ALT:5,ALKPHOS:5,BILITOT:5,PROT:5,ALBUMIN:5 in the last 168 hours CBC:  Lab 01/14/12 0750 01/11/12 0500 01/08/12 0455  WBC 12.7* -- --  NEUTROABS -- -- --  HGB 9.2* 8.8* 8.6*  HCT 27.8* 27.4* 26.3*  MCV 90.3 -- --  PLT 333 -- --   BNP (last 3 results)  Basename 12/14/11 2102 12/11/11 1330  PROBNP 10792.0* 10471.0*      Assessment:   1. Acute on chronic diastolic HF 2. 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 Atrial fibrillation, chronic 4. Morbid obesity, BMI 54.8  5. HTN, uncontrolled 6. Acute on chronic renal failure (baseline Cr 1.5) 7. Cellulitis 8. Anemia  Plan/Discussion:    She is doing great from HF perspective. Volume status and renal function look good.  Will continue demadex 20 mg daily.   Xarelto has been restarted and Hgb stable.    PlaneTEE & DC-CV tomorrow at 2pm.   BP remains quite high. Will increase amlodipine to 10 daily. Will likely need another agent added. Will await to see response to current changes.   Please use standing scale to weigh her whenever possible.   Length of Stay: 10 Elasia Furnish,MD 2:00 PM        

## 2012-01-15 ENCOUNTER — Encounter (HOSPITAL_COMMUNITY): Payer: Self-pay | Admitting: Certified Registered Nurse Anesthetist

## 2012-01-15 ENCOUNTER — Encounter: Payer: Self-pay | Admitting: *Deleted

## 2012-01-15 ENCOUNTER — Encounter
Admission: AD | Disposition: A | Discharge status: Home / Self Care | Payer: Self-pay | Source: Ambulatory Visit | Attending: Internal Medicine

## 2012-01-15 DIAGNOSIS — Q211 Atrial septal defect: Secondary | ICD-10-CM | POA: Diagnosis not present

## 2012-01-15 DIAGNOSIS — I4891 Unspecified atrial fibrillation: Secondary | ICD-10-CM

## 2012-01-15 HISTORY — PX: TEE WITHOUT CARDIOVERSION: SHX5443

## 2012-01-15 HISTORY — PX: CARDIOVERSION: SHX1299

## 2012-01-15 SURGERY — ECHOCARDIOGRAM, TRANSESOPHAGEAL
Anesthesia: Moderate Sedation

## 2012-01-15 MED ORDER — MIDAZOLAM HCL 5 MG/ML IJ SOLN
INTRAMUSCULAR | Status: AC
  Filled 2012-01-15: qty 2

## 2012-01-15 MED ORDER — FENTANYL CITRATE 0.05 MG/ML IJ SOLN
INTRAMUSCULAR | Status: DC | PRN
  Administered 2012-01-15 (×2): 25 ug via INTRAVENOUS

## 2012-01-15 MED ORDER — LIDOCAINE VISCOUS 2 % MT SOLN
OROMUCOSAL | Status: DC | PRN
  Administered 2012-01-15: 10 mL via OROMUCOSAL

## 2012-01-15 MED ORDER — FENTANYL CITRATE 0.05 MG/ML IJ SOLN
INTRAMUSCULAR | Status: AC
  Filled 2012-01-15: qty 2

## 2012-01-15 MED ORDER — SODIUM CHLORIDE 0.9 % IV SOLN
INTRAVENOUS | Status: DC
  Administered 2012-01-15: 500 mL via INTRAVENOUS

## 2012-01-15 MED ORDER — HYDRALAZINE HCL 20 MG/ML IJ SOLN
INTRAMUSCULAR | Status: DC | PRN
  Administered 2012-01-15: 10 mg via INTRAVENOUS

## 2012-01-15 MED ORDER — LIDOCAINE VISCOUS 2 % MT SOLN
OROMUCOSAL | Status: AC
  Filled 2012-01-15: qty 15

## 2012-01-15 MED ORDER — MIDAZOLAM HCL 10 MG/2ML IJ SOLN
INTRAMUSCULAR | Status: DC | PRN
  Administered 2012-01-15 (×2): 2 mg via INTRAVENOUS
  Administered 2012-01-15: 1 mg via INTRAVENOUS
  Administered 2012-01-15: 2 mg via INTRAVENOUS

## 2012-01-15 MED ORDER — PROPOFOL 10 MG/ML IV BOLUS
INTRAVENOUS | Status: DC | PRN
  Administered 2012-01-15: 140 mg via INTRAVENOUS

## 2012-01-15 MED ORDER — HYDRALAZINE HCL 20 MG/ML IJ SOLN
INTRAMUSCULAR | Status: AC
  Filled 2012-01-15: qty 1

## 2012-01-15 NOTE — Anesthesia Preprocedure Evaluation (Signed)
Anesthesia Evaluation  Patient identified by MRN, date of birth, ID band Patient awake    Reviewed: Allergy & Precautions, H&P , NPO status , Patient's Chart, lab work & pertinent test results, reviewed documented beta blocker date and time   Airway Mallampati: II TM Distance: >3 FB Neck ROM: full    Dental   Pulmonary neg pulmonary ROS,    + decreased breath sounds      Cardiovascular hypertension, + dysrhythmias Atrial Fibrillation Rhythm:irregular     Neuro/Psych  Neuromuscular disease negative psych ROS   GI/Hepatic negative GI ROS, Neg liver ROS,   Endo/Other  Morbid obesity  Renal/GU Renal disease  negative genitourinary   Musculoskeletal   Abdominal   Peds  Hematology negative hematology ROS (+)   Anesthesia Other Findings See surgeon's H&P   Reproductive/Obstetrics negative OB ROS                           Anesthesia Physical Anesthesia Plan  ASA: IV  Anesthesia Plan: General   Post-op Pain Management:    Induction: Intravenous  Airway Management Planned: Mask  Additional Equipment:   Intra-op Plan:   Post-operative Plan:   Informed Consent: I have reviewed the patients History and Physical, chart, labs and discussed the procedure including the risks, benefits and alternatives for the proposed anesthesia with the patient or authorized representative who has indicated his/her understanding and acceptance.   Dental Advisory Given  Plan Discussed with: CRNA and Surgeon  Anesthesia Plan Comments:         Anesthesia Quick Evaluation

## 2012-01-15 NOTE — Anesthesia Postprocedure Evaluation (Signed)
Anesthesia Post Note  Patient: [name]  Procedure(s) Performed: Procedure(s) (LRB): TRANSESOPHAGEAL ECHOCARDIOGRAM (TEE) (N/A) CARDIOVERSION (N/A)  Anesthesia type: general  Patient location: PACU  Post pain: Pain level controlled  Post assessment: Patient's Cardiovascular Status Stable  Last Vitals:  Filed Vitals:   01/15/12 1550  BP: 133/74  Temp:   Resp: 20    Post vital signs: Reviewed and stable  Level of consciousness: sedated  Complications: No apparent anesthesia complications

## 2012-01-15 NOTE — Transfer of Care (Signed)
Immediate Anesthesia Transfer of Care Note  Patient: [name]  Procedure(s) Performed: Procedure(s) (LRB) with comments: TRANSESOPHAGEAL ECHOCARDIOGRAM (TEE) (N/A) - cardioversion/on xarelto CARDIOVERSION (N/A)  Patient Location: PACU and Endoscopy Unit  Anesthesia Type:MAC  Level of Consciousness: awake  Airway & Oxygen Therapy: Patient Spontanous Breathing and Patient connected to nasal cannula oxygen  Post-op Assessment: Report given to PACU RN and Post -op Vital signs reviewed and stable  Post vital signs: Reviewed and stable  Complications: No apparent anesthesia complications

## 2012-01-15 NOTE — CV Procedure (Signed)
    TRANSESOPHAGEAL ECHOCARDIOGRAM GUIDED DIRECT CURRENT CARDIOVERSION  NAME:  [name]   MRN: [id number] DOB:  [birth year]   ADMIT DATE: 01/04/2012  INDICATIONS: AFib and evaluate for intracardiac shunt   PROCEDURE:   Informed consent was obtained prior to the procedure. The risks, benefits and alternatives for the procedure were discussed and the patient comprehended these risks.  Risks include, but are not limited to, cough, sore throat, vomiting, nausea, somnolence, esophageal and stomach trauma or perforation, bleeding, low blood pressure, aspiration, pneumonia, infection, trauma to the teeth and death.    After a procedural time-out, the patient was given 7 mg versed and 50 mcg fentanyl for moderate sedation.  The transesophageal probe was inserted in the esophagus and stomach without difficulty and multiple views were obtained.    FINDINGS:  LEFT VENTRICLE: EF = 60% +LVH no regional wall motion abnormalities  RIGHT VENTRICLE: Mildly dilated. Mild HK.  LEFT ATRIUM: Dilated  LEFT ATRIAL APPENDAGE: Small. No thrombus.  RIGHT ATRIUM: Dilated  AORTIC VALVE:  Trileaflet. Trivial AI  MITRAL VALVE:    Normal  TRICUSPID VALVE: Moderate TR. RV-RA gradient peak 49mm HG  PULMONIC VALVE: Normal. Trivial PI.   INTERATRIAL SEPTUM: Small PFO. Weakly positive bubble study  PERICARDIUM: small effusion  DESCENDING AORTA: Trivial plaque.   CARDIOVERSION:     Indications:  Atrial Fibrillation  Procedure Details:  Once the TEE was complete, the patient had the defibrillator pads placed in the anterior and posterior position. The patient then underwent further sedation with 140 mg IV propofol by the anesthesia service for cardioversion. Once an appropriate level of sedation was achieved, the patient received a single biphasic, synchronized 200J shock with prompt conversion to sinus rhythm. No apparent complications.  COMPLICATIONS:    There were no immediate  complications.   CONCLUSION:   1.  Successful TEE guided cardioversion.

## 2012-01-15 NOTE — Interval H&P Note (Signed)
History and Physical Interval Note:  01/15/2012 2:30 PM  [name]  has presented today for surgery, with the diagnosis of a fib  The various methods of treatment have been discussed with the patient and family. After consideration of risks, benefits and other options for treatment, the patient has consented to  Procedure(s) (LRB) with comments: TRANSESOPHAGEAL ECHOCARDIOGRAM (TEE) (N/A) - cardioversion/on xarelto CARDIOVERSION (N/A) as a surgical intervention .  The patient's history has been reviewed, patient examined, no change in status, stable for surgery.  I have reviewed the patient's chart and labs.  Questions were answered to the patient's satisfaction.     [name]

## 2012-01-15 NOTE — H&P (View-Only) (Signed)
Advanced Heart Failure Rounding Note   Subjective:    41 yo with history of HTN, gout, morbid obesity, atrial fibrillation and diastolic heart failure.  Admitted to [facility] on 10/14 for massive volume overload.  Transferred to [facility] on 10/26 due to worsening renal function, weakness and volume overload.   Echo: diastolic heart failure with ejection fraction of 55-60%, severe LVH, Mildly dilated RV.  Mildly to mod dilated RA.  moderate pericardial effusion and echocardiographic changes consistent with a possible infiltrative disease.  PAPP 72 mmHg  Milrinone stopped 10/30 after RHC c/w high output HF.   RA = 16  RV = 81/14/20  PA = 78/32 (52)  PCW = 21  Fick cardiac output/index = 11.9/4.6  Thermo CO/CI = 9.8/3.8  PVR = 2.6 Woods (Fick)  FA sat = 91%  PA sat = 65%, 72%  SVC sat = 73%  RA sat = 66%  SPEP/UPEP/fat pad biopsy/bone marrow negative for amyloid. Echo findings thought to be combination of severe HTN and PAH related to obesity-hypoventilation syndrome. Bubble study +. Has diuresed ~100 pounds while in house. Cr seems to be new baseline ~2.5. MRI of LE which showed diffuse cellulitis of LLE. No osteo or abscess. Antibiotics expanded.   Continues to do well. Volume status appears stable. (no weight on chart x 2 days)  Walking with PT. Leg still sore. Getting wrapped with ACE bandages. No further epistaxis with Xarelto. Hgb improving. Cr down to 1.7  BP remains quite high 170/90.  Last week labetalol increased to 400 bid. Then switched to carvedilol 12.5 bid. Carvedilol increased to 25 bid today.    Objective:     Physical Exam: General:  Obese, well appearing. No resp difficulty.  HEENT: normal Neck: supple.  Carotids 2+ bilat; no bruits. No lymphadenopathy or thryomegaly appreciated. Cor: PMI nondisplaced. irregular. No rubs, gallops or murmurs. Lungs: clear anteriorly Abdomen: obese, soft, nontender, nondistended. Good bowel sounds. Extremities: no cyanosis, clubbing, rash,  no  edema on R. L  Leg wrapped with ACE. + edema RUE PICC. Large gouty nodule R elbow and digits Neuro: alert & orientedx3, cranial nerves grossly intact. moves all 4 extremities w/o difficulty. Affect pleasant  Telemetry: AF 90  Labs: Basic Metabolic Panel:  Lab 01/14/12 1610 01/11/12 0500 01/08/12 0455  NA 140 139 140  K 3.7 3.9 3.8  CL 100 100 101  CO2 30 28 28   GLUCOSE 81 90 81  BUN 44* 52* 43*  CREATININE 1.73* 2.22* 2.42*  CALCIUM 9.5 9.4 8.8  MG -- -- --  PHOS -- -- --    Liver Function Tests: No results found for this basename: AST:5,ALT:5,ALKPHOS:5,BILITOT:5,PROT:5,ALBUMIN:5 in the last 168 hours CBC:  Lab 01/14/12 0750 01/11/12 0500 01/08/12 0455  WBC 12.7* -- --  NEUTROABS -- -- --  HGB 9.2* 8.8* 8.6*  HCT 27.8* 27.4* 26.3*  MCV 90.3 -- --  PLT 333 -- --   BNP (last 3 results)  Basename 12/14/11 2102 12/11/11 1330  PROBNP 10792.0* 10471.0*      Assessment:   1. Acute on chronic diastolic HF 2. 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 Atrial fibrillation, chronic 4. Morbid obesity, BMI 54.8  5. HTN, uncontrolled 6. Acute on chronic renal failure (baseline Cr 1.5) 7. Cellulitis 8. Anemia  Plan/Discussion:    She is doing great from HF perspective. Volume status and renal function look good.  Will continue demadex 20 mg daily.   Xarelto has been restarted and Hgb stable.  PlaneTEE & DC-CV tomorrow at 2pm.   BP remains quite high. Will increase amlodipine to 10 daily. Will likely need another agent added. Will await to see response to current changes.   Please use standing scale to weigh her whenever possible.   Length of Stay: 10 Daniel Bensimhon,MD 2:00 PM

## 2012-01-15 NOTE — Preoperative (Signed)
Beta Blockers   Reason not to administer Beta Blockers:Not Applicable 

## 2012-01-15 NOTE — Progress Notes (Signed)
  Echocardiogram Echocardiogram Transesophageal has been performed.  Fichera, [name] [name], 3:04 PM

## 2012-01-16 ENCOUNTER — Encounter (HOSPITAL_COMMUNITY): Payer: Self-pay | Admitting: Internal Medicine

## 2012-01-16 NOTE — Progress Notes (Signed)
Advanced Heart Failure Rounding Note   Subjective:    41 yo with history of HTN, gout, morbid obesity, atrial fibrillation and diastolic heart failure.  Admitted to [facility] on 10/14 for massive volume overload.  Transferred to [facility] on 10/26 due to worsening renal function, weakness and volume overload.   Echo: diastolic heart failure with ejection fraction of 55-60%, severe LVH, Mildly dilated RV.  Mildly to mod dilated RA.  moderate pericardial effusion and echocardiographic changes consistent with a possible infiltrative disease.  PAPP 72 mmHg  Milrinone stopped 10/30 after RHC c/w high output HF.   RA = 16  RV = 81/14/20  PA = 78/32 (52)  PCW = 21  Fick cardiac output/index = 11.9/4.6  Thermo CO/CI = 9.8/3.8  PVR = 2.6 Woods (Fick)  FA sat = 91%  PA sat = 65%, 72%  SVC sat = 73%  RA sat = 66%  SPEP/UPEP/fat pad biopsy/bone marrow negative for amyloid. Echo findings thought to be combination of severe HTN and PAH related to obesity-hypoventilation syndrome. Bubble study +. Has diuresed ~100 pounds while in house. Cr seems to be new baseline ~2.5. MRI of LE which showed diffuse cellulitis of LLE. No osteo or abscess. Antibiotics expanded.   Continues to do well. Volume status appears stable. (no weight on chart x 2 days)  Walking with PT. Leg still sore. Getting wrapped with ACE bandages. No further epistaxis with Xarelto. Hgb improving. Cr down to 1.7  Underwent successful TEE/DC-CV yesterday. Maintaining SR. Weight down to ~260.  Started on clonidine yesterday for SBP 190. Carvedilol held due to HR 55. Ambulating with PT.   Objective:     Physical Exam: General:  Obese, well appearing. No resp difficulty.  HEENT: normal Neck: supple.  Carotids 2+ bilat; no bruits. No lymphadenopathy or thryomegaly appreciated. Cor: PMI nondisplaced. regular. No rubs, gallops or murmurs. Lungs: clear anteriorly Abdomen: obese, soft, nontender, nondistended. Good bowel sounds. Extremities: no  cyanosis, clubbing, rash, no  edema on R. L  Leg wrapped with ACE. + edema RUE PICC. Large gouty nodule R elbow and digits Neuro: alert & orientedx3, cranial nerves grossly intact. moves all 4 extremities w/o difficulty. Affect pleasant  Telemetry: AF 90  Labs: Basic Metabolic Panel:  Lab 01/14/12 1610 01/11/12 0500  NA 140 139  K 3.7 3.9  CL 100 100  CO2 30 28  GLUCOSE 81 90  BUN 44* 52*  CREATININE 1.73* 2.22*  CALCIUM 9.5 9.4  MG -- --  PHOS -- --    Liver Function Tests: No results found for this basename: AST:5,ALT:5,ALKPHOS:5,BILITOT:5,PROT:5,ALBUMIN:5 in the last 168 hours CBC:  Lab 01/14/12 0750 01/11/12 0500  WBC 12.7* --  NEUTROABS -- --  HGB 9.2* 8.8*  HCT 27.8* 27.4*  MCV 90.3 --  PLT 333 --   BNP (last 3 results)  Basename 12/14/11 2102 12/11/11 1330  PROBNP 10792.0* 10471.0*      Assessment:   1. Acute on chronic diastolic HF 2. 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 Atrial fibrillation, chronic 4. Morbid obesity, BMI 54.8  5. HTN, uncontrolled 6. Acute on chronic renal failure (baseline Cr 1.5) 7. Cellulitis 8. Anemia  Plan/Discussion:    She is doing great from HF perspective. Volume status and renal function look good.  Will continue demadex 20 mg daily.   Now s/p DC-CV and doing well. Given improvement in renal function increase Xarelto to 20 daily.  BP improved this am on clonidine. Will restart carvedilol  at 12.5 bid.  Please weigh on standing scale daily.    Length of Stay: 12 Kortez Murtagh,MD 12:38 PM

## 2012-01-17 LAB — BASIC METABOLIC PANEL
BUN: 43 mg/dL — ABNORMAL HIGH (ref 6–23)
CO2: 28 mEq/L (ref 19–32)
Chloride: 101 mEq/L (ref 96–112)
GFR calc non Af Amer: 36 mL/min — ABNORMAL LOW (ref >90)
Glucose, Bld: 85 mg/dL (ref 70–99)
Potassium: 3.7 mEq/L (ref 3.5–5.1)
Sodium: 141 mEq/L (ref 135–145)

## 2012-01-17 LAB — CBC
HCT: 28.7 % — ABNORMAL LOW (ref 36.0–46.0)
Hemoglobin: 9.4 g/dL — ABNORMAL LOW (ref 12.0–15.0)
MCH: 30 pg (ref 26.0–34.0)
MCV: 91.7 fL (ref 78.0–100.0)
RBC: 3.13 MIL/uL — ABNORMAL LOW (ref 3.87–5.11)

## 2012-01-18 NOTE — Progress Notes (Signed)
Advanced Heart Failure Rounding Note   Subjective:    41 yo with history of HTN, gout, morbid obesity, atrial fibrillation and diastolic heart failure.  Admitted to [facility] on 10/14 for massive volume overload.  Transferred to [facility] on 10/26 due to worsening renal function, weakness and volume overload.   Continues to do well. Walking with PT. Leg much less sore. Weight down to 241 (initially ~ 402). Cr stabilized at 1.7   Maintaining SR. SBP 140-160 range.   Objective:     Physical Exam: General:  Obese, well appearing. No resp difficulty.  HEENT: normal Neck: supple.  Carotids 2+ bilat; no bruits. No lymphadenopathy or thryomegaly appreciated. Cor: PMI nondisplaced. regular. No rubs, gallops or murmurs. Lungs: clear anteriorly Abdomen: obese, soft, nontender, nondistended. Good bowel sounds. Extremities: no cyanosis, clubbing, rash, no  edema on R. L  Leg wrapped with ACE. + edema RUE PICC. Large gouty nodule R elbow and digits Neuro: alert & orientedx3, cranial nerves grossly intact. moves all 4 extremities w/o difficulty. Affect pleasant  Telemetry:  NSR 65  Labs: Basic Metabolic Panel:  Lab 01/17/12 1610 01/14/12 0750  NA 141 140  K 3.7 3.7  CL 101 100  CO2 28 30  GLUCOSE 85 81  BUN 43* 44*  CREATININE 1.74* 1.73*  CALCIUM 9.2 9.5  MG -- --  PHOS -- --    Liver Function Tests: No results found for this basename: AST:5,ALT:5,ALKPHOS:5,BILITOT:5,PROT:5,ALBUMIN:5 in the last 168 hours CBC:  Lab 01/17/12 0500 01/14/12 0750  WBC 11.7* 12.7*  NEUTROABS -- --  HGB 9.4* 9.2*  HCT 28.7* 27.8*  MCV 91.7 90.3  PLT 291 333   BNP (last 3 results)  Basename 12/14/11 2102 12/11/11 1330  PROBNP 10792.0* 10471.0*      Assessment:   1. Acute on chronic diastolic HF 2. 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 Atrial fibrillation, chronic 4. Morbid obesity, BMI 54.8  5. HTN, uncontrolled 6. Acute on chronic renal failure (baseline Cr 1.5) 7.  Cellulitis 8. Anemia  Plan/Discussion:    She is doing great.  Continue demadex at 20 daily. Watch renal function. May have to cut back.   Maintaining SR. Continue Xarelto.  BP improved. Can titrate clonidine to 0.2 bid as needed.   BMET tomorrow.   Length of Stay: 14 Nelly Scriven,MD 9:51 AM

## 2012-01-19 LAB — CBC
MCH: 29.8 pg (ref 26.0–34.0)
MCHC: 32.8 g/dL (ref 30.0–36.0)
MCV: 91.1 fL (ref 78.0–100.0)
Platelets: 254 10*3/uL (ref 150–400)
RBC: 3.25 MIL/uL — ABNORMAL LOW (ref 3.87–5.11)

## 2012-01-19 LAB — BASIC METABOLIC PANEL
BUN: 38 mg/dL — ABNORMAL HIGH (ref 6–23)
CO2: 29 mEq/L (ref 19–32)
Calcium: 9.6 mg/dL (ref 8.4–10.5)
Glucose, Bld: 86 mg/dL (ref 70–99)
Sodium: 141 mEq/L (ref 135–145)

## 2012-01-20 DIAGNOSIS — I509 Heart failure, unspecified: Secondary | ICD-10-CM

## 2012-01-20 NOTE — Progress Notes (Signed)
Advanced Heart Failure Rounding Note   Subjective:    41 yo with history of HTN, gout, morbid obesity, atrial fibrillation and diastolic heart failure.  Admitted to [facility] on 10/14 for massive volume overload.  Transferred to [facility] on 10/26 due to worsening renal function, weakness and volume overload.   Continues to do well. Walking with PT. Leg much less sore. Weight down to 241 on Thursday - no weight on chart since. (initially ~ 402). Cr stabilized at 1.7  BP improved on clonidine now 144/80. C/o L shoulder and L knee pain today.  Objective:     Physical Exam: General:  Obese, well appearing. No resp difficulty.  HEENT: normal Neck: supple.  Carotids 2+ bilat; no bruits. No lymphadenopathy or thryomegaly appreciated. Cor: PMI nondisplaced. regular. No rubs, gallops or murmurs. Lungs: clear anteriorly Abdomen: obese, soft, nontender, nondistended. Good bowel sounds. Extremities: no cyanosis, clubbing, rash, no  edema on R. L  Leg wrapped with ACE. Edema resolved. PICC. Large gouty nodule R elbow and digits Neuro: alert & orientedx3, cranial nerves grossly intact. moves all 4 extremities w/o difficulty. Affect pleasant  Telemetry:  NSR 60  Labs: Basic Metabolic Panel:  Lab 01/19/12 4098 01/17/12 0500 01/14/12 0750  NA 141 141 140  K 3.8 3.7 3.7  CL 102 101 100  CO2 29 28 30   GLUCOSE 86 85 81  BUN 38* 43* 44*  CREATININE 1.68* 1.74* 1.73*  CALCIUM 9.6 9.2 9.5  MG -- -- --  PHOS -- -- --    Liver Function Tests: No results found for this basename: AST:5,ALT:5,ALKPHOS:5,BILITOT:5,PROT:5,ALBUMIN:5 in the last 168 hours CBC:  Lab 01/19/12 0535 01/17/12 0500 01/14/12 0750  WBC 13.1* 11.7* 12.7*  NEUTROABS -- -- --  HGB 9.7* 9.4* 9.2*  HCT 29.6* 28.7* 27.8*  MCV 91.1 91.7 90.3  PLT 254 291 333   BNP (last 3 results)  Basename 12/14/11 2102 12/11/11 1330  PROBNP 10792.0* 10471.0*      Assessment:   1. Acute on chronic diastolic HF 2. Right heart failure/Mod to  severe PAH with high output 3. Atrial fibrillation, chronic 4. Morbid obesity, BMI 54.8  5. HTN, uncontrolled 6. Acute on chronic renal failure (baseline Cr 1.5) 7. Cellulitis 8. Anemia 9. Acute gout  Plan/Discussion:    She is doing great.  Continue demadex at 20 daily. Will recheck renal function tomorrow. May have to cut diuretics back.   Maintaining SR. Continue Xarelto.  BP improved. Continue current regimen.  Will start colchicine. If not improving can use prednisone.   Length of Stay: 16 Daniel Bensimhon,MD 2:19 PM

## 2012-01-21 LAB — BASIC METABOLIC PANEL
CO2: 27 mEq/L (ref 19–32)
Calcium: 9.5 mg/dL (ref 8.4–10.5)
Creatinine, Ser: 1.76 mg/dL — ABNORMAL HIGH (ref 0.50–1.10)
Glucose, Bld: 107 mg/dL — ABNORMAL HIGH (ref 70–99)

## 2012-01-21 NOTE — Progress Notes (Signed)
Advanced Heart Failure Rounding Note   Subjective:    41 yo with history of HTN, gout, morbid obesity, atrial fibrillation and diastolic heart failure.  Admitted to [facility] on 10/14 for massive volume overload.  Transferred to [facility] on 10/26 due to worsening renal function, weakness and volume overload.   Continues to do well.  Weight down to 229. (initially ~ 402). Cr stabilized at 1.7  BP improved on clonidine now 130-150/80. Continues to complain L shoulder and L knee pain today.  Objective:     Physical Exam: General:  Obese, well appearing. No resp difficulty.  HEENT: normal Neck: supple.  Carotids 2+ bilat; no bruits. No lymphadenopathy or thryomegaly appreciated. Cor: PMI nondisplaced. regular. No rubs, gallops or murmurs. Lungs: clear anteriorly Abdomen: obese, soft, nontender, nondistended. Good bowel sounds. Extremities: no cyanosis, clubbing, rash, no  edema on R. L  Leg wrapped with ACE. Edema resolved. PICC. Large gouty nodule R elbow and digits Neuro: alert & orientedx3, cranial nerves grossly intact. moves all 4 extremities w/o difficulty. Affect pleasant  Telemetry:  NSR 60  Labs: Basic Metabolic Panel:  Lab 01/21/12 4540 01/19/12 0535 01/17/12 0500  NA 136 141 141  K 3.7 3.8 3.7  CL 98 102 101  CO2 27 29 28   GLUCOSE 107* 86 85  BUN 39* 38* 43*  CREATININE 1.76* 1.68* 1.74*  CALCIUM 9.5 9.6 9.2  MG -- -- --  PHOS -- -- --    Liver Function Tests: No results found for this basename: AST:5,ALT:5,ALKPHOS:5,BILITOT:5,PROT:5,ALBUMIN:5 in the last 168 hours CBC:  Lab 01/19/12 0535 01/17/12 0500  WBC 13.1* 11.7*  NEUTROABS -- --  HGB 9.7* 9.4*  HCT 29.6* 28.7*  MCV 91.1 91.7  PLT 254 291   BNP (last 3 results)  Basename 12/14/11 2102 12/11/11 1330  PROBNP 10792.0* 10471.0*      Assessment:   1. Acute on chronic diastolic HF 2. 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 Atrial fibrillation, chronic 4. Morbid obesity, BMI 54.8  5.  HTN, uncontrolled 6. Acute on chronic renal failure (baseline Cr 1.5) 7. Cellulitis 8. Anemia 9. Acute gout  Plan/Discussion:    She is doing great.  Weight continues to drop briskly. Will cut demadex back to 20 mg qod.   Maintaining SR. Continue Xarelto.  BP improved. Continue current regimen.  Will start prednisone burst for gout (40 daily x 3 days) as this is limiting rehab.  Length of Stay: 17 Gabriellah Rabel,MD 8:37 AM

## 2012-01-22 DIAGNOSIS — I10 Essential (primary) hypertension: Secondary | ICD-10-CM

## 2012-01-22 DIAGNOSIS — I2789 Other specified pulmonary heart diseases: Secondary | ICD-10-CM

## 2012-01-22 LAB — BASIC METABOLIC PANEL
BUN: 45 mg/dL — ABNORMAL HIGH (ref 6–23)
Calcium: 9.6 mg/dL (ref 8.4–10.5)
GFR calc non Af Amer: 38 mL/min — ABNORMAL LOW (ref >90)
Glucose, Bld: 101 mg/dL — ABNORMAL HIGH (ref 70–99)
Sodium: 137 mEq/L (ref 135–145)

## 2012-01-22 NOTE — Progress Notes (Signed)
Advanced Heart Failure Rounding Note   Subjective:    41 yo with history of HTN, gout, morbid obesity, atrial fibrillation and diastolic heart failure.  Admitted to [facility] on 10/14 for massive volume overload.  Transferred to [facility] on 10/26 due to worsening renal function, weakness and volume overload.   Continues to do well.  Weight stable at 229-230. (initially ~ 402). Cr stabilized at 1.6-1.7. Demadex changed from daily to every other day yesterday.  BP improved on clonidine now 150/80. Gout pain improved on prednisone. Having drainage from left foot wound.  She tells me she may go home tomorrow.  Objective:     Physical Exam: General:  Obese, well appearing. No resp difficulty.  HEENT: normal Neck: supple.  Carotids 2+ bilat; no bruits. No lymphadenopathy or thryomegaly appreciated. Cor: PMI nondisplaced. regular. No rubs, gallops or murmurs. Lungs: clear anteriorly Abdomen: obese, soft, nontender, nondistended. Good bowel sounds. Extremities: no cyanosis, clubbing, rash, no  edema on R. L  Leg wrapped with ACE. Mild blood-tinged gauze over foot. Edema resolved. PICC. Large gouty nodule R elbow and digits Neuro: alert & orientedx3, cranial nerves grossly intact. moves all 4 extremities w/o difficulty. Affect pleasant  Telemetry:  NSR 60  Labs: Basic Metabolic Panel:  Lab 01/22/12 1610 01/21/12 0500 01/19/12 0535 01/17/12 0500  NA 137 136 141 141  K 3.6 3.7 3.8 3.7  CL 99 98 102 101  CO2 27 27 29 28   GLUCOSE 101* 107* 86 85  BUN 45* 39* 38* 43*  CREATININE 1.67* 1.76* 1.68* 1.74*  CALCIUM 9.6 9.5 9.6 --  MG -- -- -- --  PHOS -- -- -- --    Liver Function Tests: No results found for this basename: AST:5,ALT:5,ALKPHOS:5,BILITOT:5,PROT:5,ALBUMIN:5 in the last 168 hours CBC:  Lab 01/19/12 0535 01/17/12 0500  WBC 13.1* 11.7*  NEUTROABS -- --  HGB 9.7* 9.4*  HCT 29.6* 28.7*  MCV 91.1 91.7  PLT 254 291   BNP (last 3 results)  Basename 12/14/11 2102 12/11/11 1330    PROBNP 10792.0* 10471.0*      Assessment:   1. Acute on chronic diastolic HF 2. 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 Atrial fibrillation, chronic 4. Morbid obesity, BMI 54.8  5. HTN, uncontrolled 6. Acute on chronic renal failure (baseline Cr 1.5) 7. Cellulitis 8. Anemia 9. Acute gout  Plan/Discussion:    She is doing great.  Weight stable. We cut demadex back to 20 mg qod yesterday. Continue this.  Maintaining SR. Continue Xarelto.  BP improved but still elevated. Will increase clonidine to 0.3 bid.  Gout improving with prednisone burst (40 daily x 3 days). Today is day 2.  HF clinic appt Tues 12/3 at 11am in with [facility] (1st floor of hospital) Parking code [phone].    Cardiac meds on d/c:  1) torsemide 20mg  every other day 2) carvedilol 3.125mg  bid 3) amlodipine 10 mg daily 4) Xarelto 20mg  daily 5) allopurinol 200mg  daily 6) clonidine 0.3 bid  Length of Stay: 18 Daniel Bensimhon,MD 10:19 AM

## 2012-01-23 DIAGNOSIS — M109 Gout, unspecified: Secondary | ICD-10-CM

## 2012-01-23 DIAGNOSIS — I5033 Acute on chronic diastolic (congestive) heart failure: Secondary | ICD-10-CM

## 2012-01-23 NOTE — Progress Notes (Signed)
Advanced Heart Failure Rounding Note   Subjective:    41 yo with history of HTN, gout, morbid obesity, atrial fibrillation and diastolic heart failure.  Admitted to [facility] on 10/14 for massive volume overload.  Transferred to [facility] on 10/26 due to worsening renal function, weakness and volume overload.   Continues to do well.  Weight continues to decrease. Now 226 (initially ~ 402). Cr stabilized at 1.6-1.7. Demadex changed from daily to every other day earlier this week.  Gout pain resolved on prednisone. Clonidine increased but BP still high No CP or SOB. Able to walk on her leg.  She is going home on Friday.   Objective:     Physical Exam: General:  Obese, well appearing. No resp difficulty.  HEENT: normal Neck: supple.  Carotids 2+ bilat; no bruits. No lymphadenopathy or thryomegaly appreciated. Cor: PMI nondisplaced. regular. No rubs, gallops or murmurs. Lungs: clear anteriorly Abdomen: obese, soft, nontender, nondistended. Good bowel sounds. Extremities: no cyanosis, clubbing, rash, no  edema on R. L  Leg wrapped with ACE. Mild blood-tinged gauze over foot. Edema resolved. PICC. Large gouty nodule R elbow and digits Neuro: alert & orientedx3, cranial nerves grossly intact. moves all 4 extremities w/o difficulty. Affect pleasant  Telemetry:  NSR 60  Labs: Basic Metabolic Panel:  Lab 01/22/12 7829 01/21/12 0500 01/19/12 0535 01/17/12 0500  NA 137 136 141 141  K 3.6 3.7 3.8 3.7  CL 99 98 102 101  CO2 27 27 29 28   GLUCOSE 101* 107* 86 85  BUN 45* 39* 38* 43*  CREATININE 1.67* 1.76* 1.68* 1.74*  CALCIUM 9.6 9.5 9.6 --  MG -- -- -- --  PHOS -- -- -- --    Liver Function Tests: No results found for this basename: AST:5,ALT:5,ALKPHOS:5,BILITOT:5,PROT:5,ALBUMIN:5 in the last 168 hours CBC:  Lab 01/19/12 0535 01/17/12 0500  WBC 13.1* 11.7*  NEUTROABS -- --  HGB 9.7* 9.4*  HCT 29.6* 28.7*  MCV 91.1 91.7  PLT 254 291   BNP (last 3 results)  Basename 12/14/11 2102  12/11/11 1330  PROBNP 10792.0* 10471.0*      Assessment:   1. Acute on chronic diastolic HF 2. 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 Atrial fibrillation, chronic 4. Morbid obesity, BMI 54.8  5. HTN, uncontrolled 6. Acute on chronic renal failure (baseline Cr 1.5) 7. Cellulitis 8. Anemia 9. Acute gout  Plan/Discussion:    She is doing great.  Weight continues to drop. We cut demadex back to 20 mg qod earlier this week. Will check BMET tomorrow.  Maintaining SR. Continue Xarelto.  BP improved but still elevated. Clonidine recently increased to 0.3 bid.  Gout improving with prednisone burst (40 daily x 3 days). Today is day 3.  HF clinic appt Tues 12/3 at 11am in [facility] (1st floor of hospital) Parking code [phone].   Appreciate [facility] care.  Cardiac meds on d/c:  1) torsemide 20mg  every other day 2) carvedilol 3.125mg  bid 3) amlodipine 10 mg daily 4) Xarelto 20mg  daily 5) allopurinol 200mg  daily 6) clonidine 0.3 bid  Length of Stay: 19 Lamees Gable,MD 10:25 AM

## 2012-01-24 LAB — BASIC METABOLIC PANEL
BUN: 53 mg/dL — ABNORMAL HIGH (ref 6–23)
Creatinine, Ser: 1.47 mg/dL — ABNORMAL HIGH (ref 0.50–1.10)
GFR calc Af Amer: 51 mL/min — ABNORMAL LOW (ref >90)
GFR calc non Af Amer: 44 mL/min — ABNORMAL LOW (ref >90)

## 2012-01-24 LAB — CBC
MCHC: 32.5 g/dL (ref 30.0–36.0)
Platelets: 244 10*3/uL (ref 150–400)
RDW: 18 % — ABNORMAL HIGH (ref 11.5–15.5)

## 2012-01-24 LAB — PROCALCITONIN: Procalcitonin: 0.43 ng/mL

## 2012-01-24 NOTE — Progress Notes (Signed)
Advanced Heart Failure Rounding Note   Subjective:    41 yo with history of HTN, gout, morbid obesity, atrial fibrillation and diastolic heart failure.  Admitted to [facility] on 10/14 for massive volume overload.  Transferred to [facility] on 10/26 due to worsening renal function, weakness and volume overload.   Continues to do well.  No weight today.  Yesterday 226 (initially ~ 402). Cr stable 1.47 on demadex every other day.  No complaints today.  No CP or SOB. Able to walk on her leg. WBC minimally elevated from baseline, MD checking urine.  Weight down another 2 pounds. Renal function continues to improve  She is going home tomorrow.   Objective:     Physical Exam: General:   well appearing. No resp difficulty.  HEENT: normal Neck: supple.  Carotids 2+ bilat; no bruits. No lymphadenopathy or thryomegaly appreciated. Cor: PMI nondisplaced. regular. No rubs, gallops or murmurs. Lungs: clear anteriorly Abdomen: obese, soft, nontender, nondistended. Good bowel sounds. Extremities: no cyanosis, clubbing, rash, no  edema on R. L  Leg wrapped with ACE. Gauze over left foot. Large gouty nodule R elbow and digits Neuro: alert & orientedx3, cranial nerves grossly intact. moves all 4 extremities w/o difficulty. Affect pleasant  Telemetry:  NSR 60  Labs: Basic Metabolic Panel:  Lab 01/24/12 9604 01/22/12 0630 01/21/12 0500 01/19/12 0535  NA 140 137 136 141  K 3.9 3.6 3.7 3.8  CL 103 99 98 102  CO2 27 27 27 29   GLUCOSE 123* 101* 107* 86  BUN 53* 45* 39* 38*  CREATININE 1.47* 1.67* 1.76* 1.68*  CALCIUM 9.8 9.6 9.5 --  MG -- -- -- --  PHOS -- -- -- --    Liver Function Tests: No results found for this basename: AST:5,ALT:5,ALKPHOS:5,BILITOT:5,PROT:5,ALBUMIN:5 in the last 168 hours CBC:  Lab 01/24/12 0415 01/19/12 0535  WBC 14.8* 13.1*  NEUTROABS -- --  HGB 9.3* 9.7*  HCT 28.6* 29.6*  MCV 91.7 91.1  PLT 244 254   BNP (last 3 results)  Basename 12/14/11 2102 12/11/11 1330  PROBNP  10792.0* 10471.0*      Assessment:   1. Acute on chronic diastolic HF 2. 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 Atrial fibrillation, chronic 4. Morbid obesity, BMI 54.8  5. HTN, uncontrolled 6. Acute on chronic renal failure (baseline Cr 1.5) 7. Cellulitis 8. Anemia 9. Acute gout  Plan/Discussion:    She is doing well on current regimen.  Will continue demadex 20 mg qod.  Cr improving.  Have discussed sliding scale demadex, she voices understanding.   Maintaining SR. Continue Xarelto.  Gout improved.   HF clinic appt Tues 12/3 at 11am in with [facility] (1st floor of hospital) Parking code [phone].   Appreciate [facility] care.  Cardiac meds on d/c:  1) torsemide 20mg  every other day 2) carvedilol 3.125mg  bid 3) amlodipine 10 mg daily 4) Xarelto 20mg  daily 5) allopurinol 200mg  daily 6) clonidine 0.3 bid  Length of Stay: 20

## 2012-01-25 DIAGNOSIS — I4891 Unspecified atrial fibrillation: Secondary | ICD-10-CM

## 2012-01-25 LAB — CBC WITH DIFFERENTIAL/PLATELET
Basophils Relative: 1 % (ref 0–1)
Eosinophils Relative: 1 % (ref 0–5)
Hemoglobin: 10.9 g/dL — ABNORMAL LOW (ref 12.0–15.0)
MCH: 31.7 pg (ref 26.0–34.0)
MCV: 93 fL (ref 78.0–100.0)
Monocytes Absolute: 1.3 10*3/uL — ABNORMAL HIGH (ref 0.1–1.0)
Neutrophils Relative %: 64 % (ref 43–77)
RBC: 3.44 MIL/uL — ABNORMAL LOW (ref 3.87–5.11)

## 2012-01-25 LAB — PROCALCITONIN: Procalcitonin: 0.24 ng/mL

## 2012-01-25 LAB — BASIC METABOLIC PANEL
CO2: 28 mEq/L (ref 19–32)
Calcium: 9.8 mg/dL (ref 8.4–10.5)
GFR calc Af Amer: 46 mL/min — ABNORMAL LOW (ref >90)
Sodium: 139 mEq/L (ref 135–145)

## 2012-01-25 LAB — URINE CULTURE

## 2012-01-26 DIAGNOSIS — I83009 Varicose veins of unspecified lower extremity with ulcer of unspecified site: Secondary | ICD-10-CM | POA: Diagnosis not present

## 2012-01-26 DIAGNOSIS — I4891 Unspecified atrial fibrillation: Secondary | ICD-10-CM | POA: Diagnosis not present

## 2012-01-26 DIAGNOSIS — L02419 Cutaneous abscess of limb, unspecified: Secondary | ICD-10-CM | POA: Diagnosis not present

## 2012-01-26 DIAGNOSIS — I509 Heart failure, unspecified: Secondary | ICD-10-CM | POA: Diagnosis not present

## 2012-01-26 DIAGNOSIS — I10 Essential (primary) hypertension: Secondary | ICD-10-CM | POA: Diagnosis not present

## 2012-01-26 DIAGNOSIS — M109 Gout, unspecified: Secondary | ICD-10-CM | POA: Diagnosis not present

## 2012-01-26 DIAGNOSIS — L97909 Non-pressure chronic ulcer of unspecified part of unspecified lower leg with unspecified severity: Secondary | ICD-10-CM | POA: Diagnosis not present

## 2012-01-26 DIAGNOSIS — L03119 Cellulitis of unspecified part of limb: Secondary | ICD-10-CM | POA: Diagnosis not present

## 2012-01-28 DIAGNOSIS — I509 Heart failure, unspecified: Secondary | ICD-10-CM | POA: Diagnosis not present

## 2012-01-28 DIAGNOSIS — I10 Essential (primary) hypertension: Secondary | ICD-10-CM | POA: Diagnosis not present

## 2012-01-28 DIAGNOSIS — M109 Gout, unspecified: Secondary | ICD-10-CM | POA: Diagnosis not present

## 2012-01-28 DIAGNOSIS — I4891 Unspecified atrial fibrillation: Secondary | ICD-10-CM | POA: Diagnosis not present

## 2012-01-28 DIAGNOSIS — L03119 Cellulitis of unspecified part of limb: Secondary | ICD-10-CM | POA: Diagnosis not present

## 2012-01-28 DIAGNOSIS — L97909 Non-pressure chronic ulcer of unspecified part of unspecified lower leg with unspecified severity: Secondary | ICD-10-CM | POA: Diagnosis not present

## 2012-01-29 ENCOUNTER — Encounter (HOSPITAL_COMMUNITY): Payer: Medicare Other

## 2012-01-29 DIAGNOSIS — L97909 Non-pressure chronic ulcer of unspecified part of unspecified lower leg with unspecified severity: Secondary | ICD-10-CM | POA: Diagnosis not present

## 2012-01-29 DIAGNOSIS — L03119 Cellulitis of unspecified part of limb: Secondary | ICD-10-CM | POA: Diagnosis not present

## 2012-01-29 DIAGNOSIS — I509 Heart failure, unspecified: Secondary | ICD-10-CM | POA: Diagnosis not present

## 2012-01-29 DIAGNOSIS — M109 Gout, unspecified: Secondary | ICD-10-CM | POA: Diagnosis not present

## 2012-01-29 DIAGNOSIS — I10 Essential (primary) hypertension: Secondary | ICD-10-CM | POA: Diagnosis not present

## 2012-01-29 DIAGNOSIS — I4891 Unspecified atrial fibrillation: Secondary | ICD-10-CM | POA: Diagnosis not present

## 2012-01-30 DIAGNOSIS — M109 Gout, unspecified: Secondary | ICD-10-CM | POA: Diagnosis not present

## 2012-01-30 DIAGNOSIS — I4891 Unspecified atrial fibrillation: Secondary | ICD-10-CM | POA: Diagnosis not present

## 2012-01-30 DIAGNOSIS — L03119 Cellulitis of unspecified part of limb: Secondary | ICD-10-CM | POA: Diagnosis not present

## 2012-01-30 DIAGNOSIS — I83009 Varicose veins of unspecified lower extremity with ulcer of unspecified site: Secondary | ICD-10-CM | POA: Diagnosis not present

## 2012-01-30 DIAGNOSIS — I509 Heart failure, unspecified: Secondary | ICD-10-CM | POA: Diagnosis not present

## 2012-01-30 DIAGNOSIS — I10 Essential (primary) hypertension: Secondary | ICD-10-CM | POA: Diagnosis not present

## 2012-01-31 DIAGNOSIS — I83009 Varicose veins of unspecified lower extremity with ulcer of unspecified site: Secondary | ICD-10-CM | POA: Diagnosis not present

## 2012-01-31 DIAGNOSIS — L02419 Cutaneous abscess of limb, unspecified: Secondary | ICD-10-CM | POA: Diagnosis not present

## 2012-01-31 DIAGNOSIS — I4891 Unspecified atrial fibrillation: Secondary | ICD-10-CM | POA: Diagnosis not present

## 2012-01-31 DIAGNOSIS — I509 Heart failure, unspecified: Secondary | ICD-10-CM | POA: Diagnosis not present

## 2012-01-31 DIAGNOSIS — M109 Gout, unspecified: Secondary | ICD-10-CM | POA: Diagnosis not present

## 2012-01-31 DIAGNOSIS — I10 Essential (primary) hypertension: Secondary | ICD-10-CM | POA: Diagnosis not present

## 2012-02-01 ENCOUNTER — Ambulatory Visit (HOSPITAL_COMMUNITY): Admission: RE | Admit: 2012-02-01 | Payer: Medicare Other | Source: Ambulatory Visit

## 2012-02-04 DIAGNOSIS — I83009 Varicose veins of unspecified lower extremity with ulcer of unspecified site: Secondary | ICD-10-CM | POA: Diagnosis not present

## 2012-02-04 DIAGNOSIS — L03119 Cellulitis of unspecified part of limb: Secondary | ICD-10-CM | POA: Diagnosis not present

## 2012-02-04 DIAGNOSIS — I4891 Unspecified atrial fibrillation: Secondary | ICD-10-CM | POA: Diagnosis not present

## 2012-02-04 DIAGNOSIS — I10 Essential (primary) hypertension: Secondary | ICD-10-CM | POA: Diagnosis not present

## 2012-02-04 DIAGNOSIS — M109 Gout, unspecified: Secondary | ICD-10-CM | POA: Diagnosis not present

## 2012-02-04 DIAGNOSIS — I509 Heart failure, unspecified: Secondary | ICD-10-CM | POA: Diagnosis not present

## 2012-02-05 DIAGNOSIS — E669 Obesity, unspecified: Secondary | ICD-10-CM | POA: Diagnosis not present

## 2012-02-05 DIAGNOSIS — I43 Cardiomyopathy in diseases classified elsewhere: Secondary | ICD-10-CM | POA: Diagnosis not present

## 2012-02-05 DIAGNOSIS — I10 Essential (primary) hypertension: Secondary | ICD-10-CM | POA: Diagnosis not present

## 2012-02-06 ENCOUNTER — Ambulatory Visit (HOSPITAL_COMMUNITY)
Admission: RE | Admit: 2012-02-06 | Discharge: 2012-02-06 | Disposition: A | Discharge status: Home / Self Care | Payer: Medicare Other | Source: Ambulatory Visit | Attending: Internal Medicine | Admitting: Internal Medicine

## 2012-02-06 ENCOUNTER — Telehealth (HOSPITAL_COMMUNITY): Payer: Self-pay | Admitting: Adult Health

## 2012-02-06 VITALS — BP 158/88 | HR 78 | Wt 229.1 lb

## 2012-02-06 DIAGNOSIS — I5032 Chronic diastolic (congestive) heart failure: Secondary | ICD-10-CM | POA: Diagnosis not present

## 2012-02-06 DIAGNOSIS — S91009A Unspecified open wound, unspecified ankle, initial encounter: Secondary | ICD-10-CM

## 2012-02-06 DIAGNOSIS — S81009A Unspecified open wound, unspecified knee, initial encounter: Secondary | ICD-10-CM | POA: Diagnosis not present

## 2012-02-06 DIAGNOSIS — I4891 Unspecified atrial fibrillation: Secondary | ICD-10-CM | POA: Diagnosis not present

## 2012-02-06 DIAGNOSIS — I10 Essential (primary) hypertension: Secondary | ICD-10-CM | POA: Insufficient documentation

## 2012-02-06 DIAGNOSIS — S81809A Unspecified open wound, unspecified lower leg, initial encounter: Secondary | ICD-10-CM

## 2012-02-06 LAB — BASIC METABOLIC PANEL
CO2: 27 mEq/L (ref 19–32)
Calcium: 9.9 mg/dL (ref 8.4–10.5)
GFR calc Af Amer: 48 mL/min — ABNORMAL LOW (ref >90)
GFR calc non Af Amer: 41 mL/min — ABNORMAL LOW (ref >90)
Sodium: 141 mEq/L (ref 135–145)

## 2012-02-06 MED ORDER — POTASSIUM CHLORIDE ER 10 MEQ PO TBCR: 20.0000 meq | EXTENDED_RELEASE_TABLET | Freq: Every day | ORAL | Status: DC

## 2012-02-06 NOTE — Assessment & Plan Note (Addendum)
Maintaing SR after DC-CV. Continue Xarelto. Enrolled in patient assistance program.

## 2012-02-06 NOTE — Assessment & Plan Note (Addendum)
SBP > 130 today however at home she reports BP well controlled Continue current regimen. Follow up at next OV. Can consider increasing hydralazine to 25 mg tid at that time.

## 2012-02-06 NOTE — Assessment & Plan Note (Addendum)
Cellulitis resolved. Continue topical wound care per [facility]. Add compression stockings.

## 2012-02-06 NOTE — Patient Instructions (Addendum)
Follow up in 1 month   Do the following things EVERYDAY: 1) Weigh yourself in the morning before breakfast. Write it down and keep it in a log. 2) Take your medicines as prescribed 3) Eat low salt foods-Limit salt (sodium) to 2000 mg per day.  4) Stay as active as you can everyday 5) Limit all fluids for the day to less than 2 liters 

## 2012-02-06 NOTE — Assessment & Plan Note (Addendum)
She is much improved after almost 180 pound diuresis. NYHA II. Continue current diuretic regimen. Check BMET today. Reinforced need for daily weights and reviewed use of sliding scale diuretics. Follow up in 1 month to reassess volume status.

## 2012-02-06 NOTE — Telephone Encounter (Signed)
Provided lab results. Instructed to take KDUR 20 meq daily.   [name] verbalized understanding and appreciated the call.

## 2012-02-07 DIAGNOSIS — I4891 Unspecified atrial fibrillation: Secondary | ICD-10-CM | POA: Diagnosis not present

## 2012-02-07 DIAGNOSIS — L02419 Cutaneous abscess of limb, unspecified: Secondary | ICD-10-CM | POA: Diagnosis not present

## 2012-02-07 DIAGNOSIS — I83009 Varicose veins of unspecified lower extremity with ulcer of unspecified site: Secondary | ICD-10-CM | POA: Diagnosis not present

## 2012-02-07 DIAGNOSIS — I509 Heart failure, unspecified: Secondary | ICD-10-CM | POA: Diagnosis not present

## 2012-02-07 DIAGNOSIS — I10 Essential (primary) hypertension: Secondary | ICD-10-CM | POA: Diagnosis not present

## 2012-02-07 DIAGNOSIS — M109 Gout, unspecified: Secondary | ICD-10-CM | POA: Diagnosis not present

## 2012-02-17 NOTE — Progress Notes (Signed)
Patient ID: [name], female   DOB: 21-May-1970, 41 y.o.   MRN: [id number] PCP: Dr Loleta Chance HPI: 41 yo with history of HTN, gout, morbid obesity, atrial fibrillation and diastolic heart failure. S/P successful. DCCV 01/15/12  12/28/11 Echo: diastolic heart failure with ejection fraction of 55-60%, severe LVH, Mildly dilated RV. Mildly to mod dilated RA. moderate pericardial effusion and echocardiographic changes consistent with a possible infiltrative disease. PAPP 72 mmHg   Milrinone stopped 10/30 after RHC c/w high output HF.  RA = 16  RV = 81/14/20  PA = 78/32 (52)  PCW = 21  Fick cardiac output/index = 11.9/4.6  Thermo CO/CI = 9.8/3.8  PVR = 2.6 Woods (Fick)  FA sat = 91%  PA sat = 65%, 72%  SVC sat = 73%  RA sat = 66%   Admitted to [facility] on 10/14 for massive volume overload. Transferred to [facility] on 10/26 due to worsening renal function, weakness and volume overload. SPEP/UPEP/fat pad biopsy/bone marrow negative for amyloid. Echo findings thought to be combination of severe HTN and PAH related to obesity-hypoventilation syndrome. Bubble study +. Suspect opening of PFO in setting of PAH. Diuresed ~100 pounds while in house. Cr seems to be new baseline ~2.5. Discharged from [facility] 01/04/12 to [facility]. Discharged from [facility]  01/25/12. D/C  weight was 229 pounds.  She returns for post hospital follow up with her niece. She feels great. Denies SOB/PND/Orthopnea. Chronically sleeps on 2 pillows. LLE chronic edema with 2 small wounds noted that continue to heal. Weight at home 228 pounds. SBP at home < 130. Appetitie good. Ambulates with a cane. Lives with her sister. Compliant with medications but has difficulty paying for Xarelto.  Followed by [facility]. [facility] to add tele monitoring.     ROS: All systems negative except as listed in HPI, PMH and Problem List.  Past Medical History  Diagnosis Date  . Essential hypertension, benign     History of accelerated hypertension with  urgency  . Atrial flutter     Documented 2006 - [facility]  . Gout   . History of cardiac catheterization     Ectatic coronaries without obstruction 2006 - [facility]  . Morbid obesity   . Hypertensive heart disease     LVEF reportedly 50-55% in 2006 - [facility]  . CKD (chronic kidney disease) stage 3, GFR 30-59 ml/min   . Anemia     Current Outpatient Prescriptions  Medication Sig Dispense Refill  . acetaminophen (TYLENOL) 325 MG tablet Take 2 tablets (650 mg total) by mouth every 4 (four) hours as needed.  30 tablet  0  . allopurinol (ZYLOPRIM) 100 MG tablet Take 200 mg by mouth daily.      Marland Kitchen amLODipine (NORVASC) 10 MG tablet Take 1 tablet (10 mg total) by mouth daily.  30 tablet  0  . aspirin 81 MG tablet Take 81 mg by mouth daily.        . cloNIDine (CATAPRES) 0.2 MG tablet Take 0.2 mg by mouth 3 (three) times daily.      . folic acid (FOLVITE) 1 MG tablet Take 1 tablet (1 mg total) by mouth daily.  30 tablet  0  . HYDROcodone-acetaminophen (NORCO/VICODIN) 5-325 MG per tablet Take 1 tablet by mouth every 4 (four) hours as needed.  30 tablet  0  . pantoprazole (PROTONIX) 40 MG tablet Take 1 tablet (40 mg total) by mouth daily.  30 tablet  0  . rivaroxaban (XARELTO) 10 MG TABS tablet  Take 20 mg by mouth daily with supper.      . torsemide (DEMADEX) 20 MG tablet Take 20 mg by mouth every other day.      . potassium chloride (K-DUR) 10 MEQ tablet Take 2 tablets (20 mEq total) by mouth daily.  30 tablet  3     PHYSICAL EXAM: Filed Vitals:   02/06/12 1212  BP: 158/88  Pulse: 78    General: well appearing. No resp difficulty. (niece present) HEENT: normal  Neck: supple.  Carotids 2+ bilat; no bruits. No lymphadenopathy or thryomegaly appreciated.  Cor: PMI nondisplaced. regular. No rubs, gallops or murmurs.  Lungs: clear anteriorly  Abdomen: obese, soft, nontender, nondistended. Good bowel sounds.  Extremities: no cyanosis, clubbing, rash, no edema on R. LLE wrapped with Kerlix 1+ edema. Large  gouty nodule R elbow and digits  Neuro: alert & orientedx3, cranial nerves grossly intact. moves all 4 extremities w/o difficulty. Affect pleasant   ECG: Sinus Bradycardia 59 BPM   ASSESSMENT & PLAN:

## 2012-03-07 ENCOUNTER — Ambulatory Visit (HOSPITAL_COMMUNITY)
Admission: RE | Admit: 2012-03-07 | Discharge: 2012-03-07 | Disposition: A | Discharge status: Home / Self Care | Payer: Medicare Other | Source: Ambulatory Visit | Attending: Internal Medicine | Admitting: Internal Medicine

## 2012-03-07 ENCOUNTER — Telehealth (HOSPITAL_COMMUNITY): Payer: Self-pay | Admitting: Cardiology

## 2012-03-07 VITALS — BP 164/82 | HR 84 | Wt 238.0 lb

## 2012-03-07 DIAGNOSIS — I10 Essential (primary) hypertension: Secondary | ICD-10-CM | POA: Diagnosis not present

## 2012-03-07 DIAGNOSIS — I509 Heart failure, unspecified: Secondary | ICD-10-CM | POA: Diagnosis not present

## 2012-03-07 DIAGNOSIS — M109 Gout, unspecified: Secondary | ICD-10-CM | POA: Diagnosis not present

## 2012-03-07 DIAGNOSIS — I5032 Chronic diastolic (congestive) heart failure: Secondary | ICD-10-CM | POA: Diagnosis not present

## 2012-03-07 NOTE — Assessment & Plan Note (Signed)
Volume status mildly elevated with 10 pound weight gain.  Instructed to take additional Torsemide today. Repeat BMET. Follow up in 1 month to reassess volume status.

## 2012-03-07 NOTE — Assessment & Plan Note (Signed)
Ms Bohnet says that she is taking hydralazine although I have no record of this. Asked her to call back today with hydralazine dose. SBP > 150 will need to titrate up after we know her current dose at home.

## 2012-03-07 NOTE — Telephone Encounter (Signed)
Pt called to verify dose of hydralazine 10 mg TID

## 2012-03-07 NOTE — Patient Instructions (Addendum)
Follow up in 1 month  Take an extra dose of Torsemide and potassium today.   Call us back with your dose of Hydralazine.   Do the following things EVERYDAY: 1) Weigh yourself in the morning before breakfast. Write it down and keep it in a log. 2) Take your medicines as prescribed 3) Eat low salt foods-Limit salt (sodium) to 2000 mg per day.  4) Stay as active as you can everyday 5) Limit all fluids for the day to less than 2 liters

## 2012-03-07 NOTE — Progress Notes (Signed)
Patient ID: [name], female   DOB: 01-01-1971, 42 y.o.   MRN: [id number] PCP: Dr Loleta Chance HPI: 42 yo with history of HTN, gout, morbid obesity, atrial fibrillation and diastolic heart failure. S/P successful. DCCV 01/15/12  12/28/11 Echo: diastolic heart failure with ejection fraction of 55-60%, severe LVH, Mildly dilated RV. Mildly to mod dilated RA. moderate pericardial effusion and echocardiographic changes consistent with a possible infiltrative disease. PAPP 72 mmHg   Milrinone stopped 10/30 after RHC c/w high output HF.  RA = 16  RV = 81/14/20  PA = 78/32 (52)  PCW = 21  Fick cardiac output/index = 11.9/4.6  Thermo CO/CI = 9.8/3.8  PVR = 2.6 Woods (Fick)  FA sat = 91%  PA sat = 65%, 72%  SVC sat = 73%  RA sat = 66%   Admitted to [facility] on 10/14 for massive volume overload. Transferred to [facility] on 10/26 due to worsening renal function, weakness and volume overload. SPEP/UPEP/fat pad biopsy/bone marrow negative for amyloid. Echo findings thought to be combination of severe HTN and PAH related to obesity-hypoventilation syndrome. Bubble study +. Suspect opening of PFO in setting of PAH. Diuresed ~100 pounds while in house. Cr seems to be new baseline ~2.5. Discharged from [facility] 01/04/12 to [facility]. Discharged from [facility]  01/25/12. D/C  weight was 229 pounds.   [name] Denies SOB/PND/Orthopnea. Sleeps on 1-2 pillows at night. Complains LUE pain due to gout. She has follow up with PCP today. Weight at home 229-232 pounds. She has not required any extra Torsemide. Chronic LLE edema. Wears ted hose daily. Complaint with medications. Home health services completed. Eating high salt foods fried fish.     ROS: All systems negative except as listed in HPI, PMH and Problem List.  Past Medical History  Diagnosis Date  . Essential hypertension, benign     History of accelerated hypertension with urgency  . Atrial flutter     Documented 2006 - [facility]  . Gout    . History of cardiac catheterization     Ectatic coronaries without obstruction 2006 - [facility]  . Morbid obesity   . Hypertensive heart disease     LVEF reportedly 50-55% in 2006 - [facility]  . CKD (chronic kidney disease) stage 3, GFR 30-59 ml/min   . Anemia     Current Outpatient Prescriptions  Medication Sig Dispense Refill  . acetaminophen (TYLENOL) 325 MG tablet Take 2 tablets (650 mg total) by mouth every 4 (four) hours as needed.  30 tablet  0  . allopurinol (ZYLOPRIM) 100 MG tablet Take 200 mg by mouth daily.      Marland Kitchen amLODipine (NORVASC) 10 MG tablet Take 1 tablet (10 mg total) by mouth daily.  30 tablet  0  . aspirin 81 MG tablet Take 81 mg by mouth daily.        . cloNIDine (CATAPRES) 0.2 MG tablet Take 0.2 mg by mouth 3 (three) times daily.      . folic acid (FOLVITE) 1 MG tablet Take 1 tablet (1 mg total) by mouth daily.  30 tablet  0  . HYDROcodone-acetaminophen (NORCO/VICODIN) 5-325 MG per tablet Take 1 tablet by mouth every 4 (four) hours as needed.  30 tablet  0  . pantoprazole (PROTONIX) 40 MG tablet Take 1 tablet (40 mg total) by mouth daily.  30 tablet  0  . potassium chloride (K-DUR) 10 MEQ tablet Take 2 tablets (20 mEq total) by mouth daily.  30  tablet  3  . rivaroxaban (XARELTO) 10 MG TABS tablet Take 20 mg by mouth daily with supper.      . torsemide (DEMADEX) 20 MG tablet Take 20 mg by mouth every other day.         PHYSICAL EXAM: Filed Vitals:   03/07/12 0910  BP: 164/82  Pulse: 84    General: well appearing. No resp difficulty. (niece present) HEENT: normal  Neck: supple.  Carotids 2+ bilat; no bruits. No lymphadenopathy or thryomegaly appreciated.  Cor: PMI nondisplaced. regular. No rubs, gallops or murmurs.  Lungs: clear anteriorly  Abdomen: obese, soft, nontender, nondistended. Good bowel sounds.  Extremities: no cyanosis, clubbing, rash, no edema on R. LLE 1+  ted hose Large gouty nodule R elbow and digits  Neuro: alert & orientedx3, cranial nerves  grossly intact. moves all 4 extremities w/o difficulty. Affect pleasant      ASSESSMENT & PLAN:

## 2012-03-09 MED ORDER — HYDRALAZINE HCL 25 MG PO TABS: 25.0000 mg | ORAL_TABLET | Freq: Three times a day (TID) | ORAL | Status: DC

## 2012-03-09 NOTE — Telephone Encounter (Signed)
Instructed to take Hydralazine 25 mg three times a day.   Ms [name] verbalized understanding.   Rajan Burgard NP-C 6:49 PM

## 2012-03-12 DIAGNOSIS — L02419 Cutaneous abscess of limb, unspecified: Secondary | ICD-10-CM | POA: Diagnosis not present

## 2012-03-12 DIAGNOSIS — I509 Heart failure, unspecified: Secondary | ICD-10-CM | POA: Diagnosis not present

## 2012-03-12 DIAGNOSIS — I10 Essential (primary) hypertension: Secondary | ICD-10-CM | POA: Diagnosis not present

## 2012-03-12 DIAGNOSIS — M109 Gout, unspecified: Secondary | ICD-10-CM | POA: Diagnosis not present

## 2012-03-12 DIAGNOSIS — I83009 Varicose veins of unspecified lower extremity with ulcer of unspecified site: Secondary | ICD-10-CM | POA: Diagnosis not present

## 2012-03-12 DIAGNOSIS — I4891 Unspecified atrial fibrillation: Secondary | ICD-10-CM | POA: Diagnosis not present

## 2012-03-17 ENCOUNTER — Telehealth (HOSPITAL_COMMUNITY): Payer: Self-pay | Admitting: *Deleted

## 2012-03-17 MED ORDER — HYDRALAZINE HCL 25 MG PO TABS: 12.5000 mg | ORAL_TABLET | Freq: Three times a day (TID) | ORAL | Status: DC

## 2012-03-17 NOTE — Telephone Encounter (Signed)
Pt's niece called to report pt has not been feeling well, she is dizzy and fatigued, she states this has been going on since Hydralazine was increased, she also is concerned pt's may need another blood transfusion or iron pills.  Pt's wt is stable at 232 lb, BP 115/70 HR 70 today, per Ulyess Blossom, PA can decrease Hydralazine to 12.5 mg TID and see if it helps, pt's niece aware and verbalizes understanding, advised to f/u with Dr Sherwood Gambler regarding iron and ? Blood transfusion, she states pt has not had any bleeding and hasn't noticed any dark tarry stools, she said she is sch to see him on [place]

## 2012-04-09 ENCOUNTER — Ambulatory Visit (HOSPITAL_COMMUNITY): Payer: Medicare Other

## 2012-04-12 ENCOUNTER — Other Ambulatory Visit: Payer: Self-pay

## 2012-05-15 ENCOUNTER — Encounter (HOSPITAL_COMMUNITY): Payer: Self-pay

## 2012-05-15 ENCOUNTER — Ambulatory Visit (HOSPITAL_COMMUNITY)
Admission: RE | Admit: 2012-05-15 | Discharge: 2012-05-15 | Disposition: A | Discharge status: Home / Self Care | Payer: Medicare Other | Source: Ambulatory Visit | Attending: Internal Medicine | Admitting: Internal Medicine

## 2012-05-15 VITALS — BP 172/108 | HR 94 | Wt 317.0 lb

## 2012-05-15 DIAGNOSIS — I5032 Chronic diastolic (congestive) heart failure: Secondary | ICD-10-CM | POA: Diagnosis not present

## 2012-05-15 LAB — BASIC METABOLIC PANEL
Calcium: 9.3 mg/dL (ref 8.4–10.5)
GFR calc Af Amer: 51 mL/min — ABNORMAL LOW (ref >90)
GFR calc non Af Amer: 44 mL/min — ABNORMAL LOW (ref >90)
Glucose, Bld: 84 mg/dL (ref 70–99)
Potassium: 4.3 mEq/L (ref 3.5–5.1)
Sodium: 140 mEq/L (ref 135–145)

## 2012-05-15 MED ORDER — TORSEMIDE 20 MG PO TABS: 40.0000 mg | ORAL_TABLET | ORAL | Status: DC

## 2012-05-15 MED ORDER — FUROSEMIDE 10 MG/ML IJ SOLN
80.0000 mg | Freq: Once | INTRAMUSCULAR | Status: AC
  Administered 2012-05-15: 80 mg via INTRAVENOUS

## 2012-05-15 MED ORDER — METOLAZONE 2.5 MG PO TABS: 2.5000 mg | ORAL_TABLET | ORAL | Status: DC | PRN

## 2012-05-15 NOTE — Progress Notes (Signed)
Pt had 275 cc of urine output

## 2012-05-15 NOTE — Progress Notes (Signed)
IV discontinued, catheter intact

## 2012-05-15 NOTE — Progress Notes (Signed)
22 gauge IV started in left hand, pt was given 80 mg IV lasix pt tolerated well with no complaints

## 2012-05-15 NOTE — Progress Notes (Signed)
Patient ID: [name], female   DOB: [birth year], 42 y.o.   MRN: [id number] PCP: Dr Loleta Chance HPI: 42 yo with history of HTN, gout, morbid obesity, atrial fibrillation and diastolic heart failure. S/P successful. DCCV 01/15/12  12/28/11 Echo: diastolic heart failure with ejection fraction of 55-60%, severe LVH, Mildly dilated RV. Mildly to mod dilated RA. moderate pericardial effusion and echocardiographic changes consistent with a possible infiltrative disease. PAPP 72 mmHg   Milrinone stopped 10/30 after RHC c/w high output HF.  RA = 16  RV = 81/14/20  PA = 78/32 (52)  PCW = 21  Fick cardiac output/index = 11.9/4.6  Thermo CO/CI = 9.8/3.8  PVR = 2.6 Woods (Fick)  FA sat = 91%  PA sat = 65%, 72%  SVC sat = 73%  RA sat = 66%   Admitted to [facility] on 10/14 for massive volume overload. Transferred to [facility] on 10/26 due to worsening renal function, weakness and volume overload. SPEP/UPEP/fat pad biopsy/bone marrow negative for amyloid. Echo findings thought to be combination of severe HTN and PAH related to obesity-hypoventilation syndrome. Bubble study +. Suspect opening of PFO in setting of PAH. Diuresed ~100 pounds while in house. Cr seems to be new baseline ~2.5. Discharged from [facility] 01/04/12 to [facility]. Discharged from [facility]  01/25/12. D/C  weight was 229 pounds.    [name] Last visit hydralazine increased to 25 mg tid however she cut back to 12.5 tid due to dizziness but she is not sure if she had difficulty due to death of her boyfriend. She just lost her boyfriend due to massive heart attack 03/09/12. Denies SOB/PND/Orthopnea. Sleeps on 2 pillows at night.  Weight at home trending up to over 300 pounds.  She is not weighing daily. Wears ted hose daily. Says she does take her medications, but after the death of her boyfriend she was not taking medications as ordered. Complaint with medications. Eating high salt foods.       ROS: All systems negative except  as listed in HPI, PMH and Problem List.  Past Medical History  Diagnosis Date  . Essential hypertension, benign     History of accelerated hypertension with urgency  . Atrial flutter     Documented 2006 - [facility]  . Gout   . History of cardiac catheterization     Ectatic coronaries without obstruction 2006 - [facility]  . Morbid obesity   . Hypertensive heart disease     LVEF reportedly 50-55% in 2006 - [facility]  . CKD (chronic kidney disease) stage 3, GFR 30-59 ml/min   . Anemia     Current Outpatient Prescriptions  Medication Sig Dispense Refill  . acetaminophen (TYLENOL) 325 MG tablet Take 2 tablets (650 mg total) by mouth every 4 (four) hours as needed.  30 tablet  0  . allopurinol (ZYLOPRIM) 100 MG tablet Take 200 mg by mouth daily.      Marland Kitchen amLODipine (NORVASC) 10 MG tablet Take 1 tablet (10 mg total) by mouth daily.  30 tablet  0  . aspirin 81 MG tablet Take 81 mg by mouth daily.        . cloNIDine (CATAPRES) 0.2 MG tablet Take 0.2 mg by mouth 3 (three) times daily.      . folic acid (FOLVITE) 1 MG tablet Take 1 tablet (1 mg total) by mouth daily.  30 tablet  0  . hydrALAZINE (APRESOLINE) 25 MG tablet Take 0.5 tablets (12.5 mg total) by mouth  3 (three) times daily.  90 tablet  3  . HYDROcodone-acetaminophen (NORCO/VICODIN) 5-325 MG per tablet Take 1 tablet by mouth every 4 (four) hours as needed.  30 tablet  0  . pantoprazole (PROTONIX) 40 MG tablet Take 1 tablet (40 mg total) by mouth daily.  30 tablet  0  . potassium chloride (K-DUR) 10 MEQ tablet Take 2 tablets (20 mEq total) by mouth daily.  30 tablet  3  . rivaroxaban (XARELTO) 10 MG TABS tablet Take 20 mg by mouth daily with supper.      . torsemide (DEMADEX) 20 MG tablet Take 20 mg by mouth every other day.       No current facility-administered medications for this encounter.     PHYSICAL EXAM: Filed Vitals:   05/15/12 1004  BP: 172/108  Pulse: 94    General: well appearing. No resp difficulty. (niece present) HEENT:  normal  Neck: supple. JVP 10-11 Carotids 2+ bilat; no bruits. No lymphadenopathy or thryomegaly appreciated.  Cor: PMI nondisplaced. regular. No rubs, gallops or murmurs.  Lungs: clear anteriorly  Abdomen: obese, soft, nontender, nondistended. Good bowel sounds.  Extremities: no cyanosis, clubbing, rash, no edema on RLE /LLE 3+  Large gouty nodule R elbow and digits  Neuro: alert & orientedx3, cranial nerves grossly intact. moves all 4 extremities w/o difficulty. Affect pleasant      ASSESSMENT & PLAN:

## 2012-05-15 NOTE — Progress Notes (Signed)
Pt had 200 cc of urine output 

## 2012-05-15 NOTE — Patient Instructions (Addendum)
Follow up next week  Take Metolazone 2.5 mg Friday and Saturday  Take Torsemide 40 mg daily 2 tabs  Do the following things EVERYDAY: 1) Weigh yourself in the morning before breakfast. Write it down and keep it in a log. 2) Take your medicines as prescribed 3) Eat low salt foods-Limit salt (sodium) to 2000 mg per day.  4) Stay as active as you can everyday 5) Limit all fluids for the day to less than 2 liters

## 2012-05-15 NOTE — Progress Notes (Signed)
Pt had 550 cc of urine output, for a total of 1025 cc of urine output since IV Lasix given

## 2012-05-15 NOTE — Assessment & Plan Note (Addendum)
She returns for follow up today with significant weight gain that she associates with the sudden death of her boyfriend. Volume status elevated in the setting of dietary/medication noncomplinace. Weight up at least 70 pounds over the last 2 months. Given 80 mg IV lasix in clinic today. Diuresed 1liter. Increase home diuretics. She is instructed to take 40 mg of Torsemide daily and add 2.5 mg of Metolazone for the next 2 days. Reinforced daily weights, low salt food choices, and limiting fluid intake. Follow up next week to reassess volume status and if her weight is not trending down will need to admit to hospital to diurese.

## 2012-05-16 ENCOUNTER — Telehealth (HOSPITAL_COMMUNITY): Payer: Self-pay | Admitting: Adult Health

## 2012-05-16 NOTE — Telephone Encounter (Signed)
Spoke with her niece regarding lab results. BMET stable.   Tonya appreciated call back.   Dionta Larke NP-C 12:14 PM

## 2012-05-21 ENCOUNTER — Ambulatory Visit (HOSPITAL_COMMUNITY)
Admission: RE | Admit: 2012-05-21 | Discharge: 2012-05-21 | Disposition: A | Discharge status: Home / Self Care | Payer: Medicare Other | Source: Ambulatory Visit | Attending: Internal Medicine | Admitting: Internal Medicine

## 2012-05-21 VITALS — BP 122/84 | Wt 264.5 lb

## 2012-05-21 DIAGNOSIS — I4891 Unspecified atrial fibrillation: Secondary | ICD-10-CM | POA: Diagnosis not present

## 2012-05-21 DIAGNOSIS — I5032 Chronic diastolic (congestive) heart failure: Secondary | ICD-10-CM

## 2012-05-21 DIAGNOSIS — I10 Essential (primary) hypertension: Secondary | ICD-10-CM | POA: Diagnosis not present

## 2012-05-21 DIAGNOSIS — Z6841 Body Mass Index (BMI) 40.0 and over, adult: Secondary | ICD-10-CM

## 2012-05-21 LAB — BASIC METABOLIC PANEL
Chloride: 99 mEq/L (ref 96–112)
GFR calc Af Amer: 50 mL/min — ABNORMAL LOW (ref >90)
GFR calc non Af Amer: 43 mL/min — ABNORMAL LOW (ref >90)
Potassium: 3.4 mEq/L — ABNORMAL LOW (ref 3.5–5.1)
Sodium: 141 mEq/L (ref 135–145)

## 2012-05-21 NOTE — Assessment & Plan Note (Signed)
She will begin to walk again outside as the weather allows.

## 2012-05-21 NOTE — Progress Notes (Signed)
PCP: Dr Loleta Chance HPI: 42 yo with history of HTN, gout, morbid obesity, atrial fibrillation and diastolic heart failure. S/P successful. DCCV 01/15/12  12/28/11 Echo: diastolic heart failure with ejection fraction of 55-60%, severe LVH, Mildly dilated RV. Mildly to mod dilated RA. moderate pericardial effusion and echocardiographic changes consistent with a possible infiltrative disease. PAPP 72 mmHg   Milrinone stopped 10/30 after RHC c/w high output HF.  RA = 16  RV = 81/14/20  PA = 78/32 (52)  PCW = 21  Fick cardiac output/index = 11.9/4.6  Thermo CO/CI = 9.8/3.8  PVR = 2.6 Woods (Fick)  FA sat = 91%  PA sat = 65%, 72%  SVC sat = 73%  RA sat = 66%   Admitted to [facility] on 10/14 for massive volume overload. Transferred to [facility] on 10/26 due to worsening renal function, weakness and volume overload. SPEP/UPEP/fat pad biopsy/bone marrow negative for amyloid. Echo findings thought to be combination of severe HTN and PAH related to obesity-hypoventilation syndrome. Bubble study +. Suspect opening of PFO in setting of PAH. Diuresed ~100 pounds while in house. Cr seems to be new baseline ~2.5. Discharged from [facility] 01/04/12 to [facility]. Discharged from [facility]  01/25/12. D/C  weight was 229 pounds.    She returns for 1 week follow up today.  She was given two days of metolazone 2.5 mg and increased torsemide 40 mg daily.  She feels much better.  Her weight is down to 262 pounds this morning.  States her breathing is good.  Chronic 2 pillow orthopnea.  No PND.  She is ready to start walking as the weather is getting better.  She feels she can start walking again.  Compliant with meds.     ROS: All systems negative except as listed in HPI, PMH and Problem List.  Past Medical History  Diagnosis Date  . Essential hypertension, benign     History of accelerated hypertension with urgency  . Atrial flutter     Documented 2006 - [facility]  . Gout   . History of cardiac catheterization     Ectatic  coronaries without obstruction 2006 - [facility]  . Morbid obesity   . Hypertensive heart disease     LVEF reportedly 50-55% in 2006 - [facility]  . CKD (chronic kidney disease) stage 3, GFR 30-59 ml/min   . Anemia     Current Outpatient Prescriptions  Medication Sig Dispense Refill  . acetaminophen (TYLENOL) 325 MG tablet Take 2 tablets (650 mg total) by mouth every 4 (four) hours as needed.  30 tablet  0  . allopurinol (ZYLOPRIM) 100 MG tablet Take 200 mg by mouth daily.      Marland Kitchen amLODipine (NORVASC) 10 MG tablet Take 1 tablet (10 mg total) by mouth daily.  30 tablet  0  . aspirin 81 MG tablet Take 81 mg by mouth daily.        . cloNIDine (CATAPRES) 0.2 MG tablet Take 0.2 mg by mouth 3 (three) times daily.      . folic acid (FOLVITE) 1 MG tablet Take 1 tablet (1 mg total) by mouth daily.  30 tablet  0  . hydrALAZINE (APRESOLINE) 25 MG tablet Take 0.5 tablets (12.5 mg total) by mouth 3 (three) times daily.  90 tablet  3  . HYDROcodone-acetaminophen (NORCO/VICODIN) 5-325 MG per tablet Take 1 tablet by mouth every 4 (four) hours as needed.  30 tablet  0  . metolazone (ZAROXOLYN) 2.5 MG tablet Take 1 tablet (2.5  mg total) by mouth as needed.  10 tablet  3  . pantoprazole (PROTONIX) 40 MG tablet Take 1 tablet (40 mg total) by mouth daily.  30 tablet  0  . potassium chloride (K-DUR) 10 MEQ tablet Take 2 tablets (20 mEq total) by mouth daily.  30 tablet  3  . rivaroxaban (XARELTO) 10 MG TABS tablet Take 20 mg by mouth daily with supper.      . torsemide (DEMADEX) 20 MG tablet Take 40 mg by mouth daily.       No current facility-administered medications for this encounter.     PHYSICAL EXAM: Filed Vitals:   05/21/12 1350  BP: 122/84  Weight: 264 lb 8 oz (119.976 kg)    General: well appearing. No resp difficulty. (niece present) HEENT: normal  Neck: supple. JVP 8-9 Carotids 2+ bilat; no bruits. No lymphadenopathy or thryomegaly appreciated.  Cor: PMI nondisplaced. regular. No rubs, gallops or  murmurs.  Lungs: clear  Abdomen: obese, soft, nontender, nondistended. Good bowel sounds.  Extremities: no cyanosis, clubbing, rash, 1+ RLE /LLE 3+.  Large gouty nodule R elbow and digits  Neuro: alert & orientedx3, cranial nerves grossly intact. moves all 4 extremities w/o difficulty. Affect pleasant      ASSESSMENT & PLAN:

## 2012-05-21 NOTE — Assessment & Plan Note (Signed)
Patient presents for one week follow up today.  She is down 53 pounds with metolazone and increased torsemide.  Will continue current dose of torsemide and check BMET today.  Have discussed fluid restrictions and low sodium diet in full.  She voices understanding.  She will call if weight begins to increase.

## 2012-05-21 NOTE — Patient Instructions (Addendum)
Continue current medications  Call if weight is increasing.    Follow up 2 weeks.

## 2012-05-21 NOTE — Assessment & Plan Note (Signed)
Well-controlled on current regimen, no changes. 

## 2012-05-30 DIAGNOSIS — N289 Disorder of kidney and ureter, unspecified: Secondary | ICD-10-CM | POA: Diagnosis not present

## 2012-05-30 DIAGNOSIS — E669 Obesity, unspecified: Secondary | ICD-10-CM | POA: Diagnosis not present

## 2012-05-30 DIAGNOSIS — M109 Gout, unspecified: Secondary | ICD-10-CM | POA: Diagnosis not present

## 2012-05-30 DIAGNOSIS — I10 Essential (primary) hypertension: Secondary | ICD-10-CM | POA: Diagnosis not present

## 2012-06-03 ENCOUNTER — Encounter (HOSPITAL_COMMUNITY): Payer: Self-pay

## 2012-06-03 ENCOUNTER — Ambulatory Visit (HOSPITAL_COMMUNITY)
Admission: RE | Admit: 2012-06-03 | Discharge: 2012-06-03 | Disposition: A | Discharge status: Home / Self Care | Payer: Medicare Other | Source: Ambulatory Visit | Attending: Internal Medicine | Admitting: Internal Medicine

## 2012-06-03 VITALS — BP 144/82 | Wt 239.0 lb

## 2012-06-03 DIAGNOSIS — I10 Essential (primary) hypertension: Secondary | ICD-10-CM | POA: Diagnosis not present

## 2012-06-03 DIAGNOSIS — I5032 Chronic diastolic (congestive) heart failure: Secondary | ICD-10-CM | POA: Insufficient documentation

## 2012-06-03 LAB — BASIC METABOLIC PANEL
CO2: 26 mEq/L (ref 19–32)
Chloride: 106 mEq/L (ref 96–112)
GFR calc Af Amer: 37 mL/min — ABNORMAL LOW (ref >90)
Potassium: 3.7 mEq/L (ref 3.5–5.1)
Sodium: 144 mEq/L (ref 135–145)

## 2012-06-03 MED ORDER — HYDRALAZINE HCL 25 MG PO TABS: 25.0000 mg | ORAL_TABLET | Freq: Three times a day (TID) | ORAL | Status: DC

## 2012-06-03 NOTE — Assessment & Plan Note (Signed)
Volume status returning to baseline, ~10 pounds to go.  Will continue torsemide 40 mg daily.  She will call if she becomes dizzy or weight begins to climb.  Re-discussed low sodium diet and fluid restrictions.  She voices understanding. BMET today.

## 2012-06-03 NOTE — Assessment & Plan Note (Signed)
Elevated, will increase hydralazine 25 mg (1 tab) three times daily.  She will call if she becomes dizzy or lightheadedness.

## 2012-06-03 NOTE — Progress Notes (Signed)
PCP: Dr Loleta Chance  HPI: [name] is a 42 yo AAF with history of HTN, gout, morbid obesity, atrial fibrillation and diastolic heart failure. S/P successful. DCCV 01/15/12  12/28/11 Echo: diastolic heart failure with ejection fraction of 55-60%, severe LVH, Mildly dilated RV. Mildly to mod dilated RA. moderate pericardial effusion and echocardiographic changes consistent with a possible infiltrative disease. PAPP 72 mmHg   Milrinone stopped 10/30 after RHC c/w high output HF.  RA = 16  RV = 81/14/20  PA = 78/32 (52)  PCW = 21  Fick cardiac output/index = 11.9/4.6  Thermo CO/CI = 9.8/3.8  PVR = 2.6 Woods (Fick)  FA sat = 91%  PA sat = 65%, 72%  SVC sat = 73%  RA sat = 66%   Admitted to [facility] on 10/14 for massive volume overload. Transferred to [facility] on 10/26 due to worsening renal function, weakness and volume overload. SPEP/UPEP/fat pad biopsy/bone marrow negative for amyloid. Echo findings thought to be combination of severe HTN and PAH related to obesity-hypoventilation syndrome. Bubble study +. Suspect opening of PFO in setting of PAH. Diuresed ~100 pounds while in house. Cr seems to be new baseline ~2.5. Discharged from [facility] 01/04/12 to [facility]. Discharged from [facility]  01/25/12. D/C  weight was 229 pounds.    She returns for follow up today.  Last visit she was continued on dose of increased torsemide 40 mg daily as weight was still up ~30 pounds.  She says she feels very well.  She denies dyspnea, orthopnea or PND.  Weight at home 240 pounds. No dizziness/syncope.  No chest pain.  Starting to walk when weather is good.     ROS: All systems negative except as listed in HPI, PMH and Problem List.  Past Medical History  Diagnosis Date  . Essential hypertension, benign     History of accelerated hypertension with urgency  . Atrial flutter     Documented 2006 - [facility]  . Gout   . History of cardiac catheterization     Ectatic coronaries without obstruction 2006 - [facility]  .  Morbid obesity   . Hypertensive heart disease     LVEF reportedly 50-55% in 2006 - [facility]  . CKD (chronic kidney disease) stage 3, GFR 30-59 ml/min   . Anemia     Current Outpatient Prescriptions  Medication Sig Dispense Refill  . acetaminophen (TYLENOL) 325 MG tablet Take 2 tablets (650 mg total) by mouth every 4 (four) hours as needed.  30 tablet  0  . allopurinol (ZYLOPRIM) 100 MG tablet Take 200 mg by mouth daily.      Marland Kitchen amLODipine (NORVASC) 10 MG tablet Take 1 tablet (10 mg total) by mouth daily.  30 tablet  0  . aspirin 81 MG tablet Take 81 mg by mouth daily.        . cloNIDine (CATAPRES) 0.2 MG tablet Take 0.2 mg by mouth 3 (three) times daily.      . folic acid (FOLVITE) 1 MG tablet Take 1 tablet (1 mg total) by mouth daily.  30 tablet  0  . hydrALAZINE (APRESOLINE) 25 MG tablet Take 0.5 tablets (12.5 mg total) by mouth 3 (three) times daily.  90 tablet  3  . HYDROcodone-acetaminophen (NORCO/VICODIN) 5-325 MG per tablet Take 1 tablet by mouth every 4 (four) hours as needed.  30 tablet  0  . metolazone (ZAROXOLYN) 2.5 MG tablet Take 1 tablet (2.5 mg total) by mouth as needed.  10 tablet  3  .  pantoprazole (PROTONIX) 40 MG tablet Take 1 tablet (40 mg total) by mouth daily.  30 tablet  0  . potassium chloride (K-DUR) 10 MEQ tablet Take 2 tablets (20 mEq total) by mouth daily.  30 tablet  3  . rivaroxaban (XARELTO) 10 MG TABS tablet Take 20 mg by mouth daily with supper.      . torsemide (DEMADEX) 20 MG tablet Take 40 mg by mouth daily.       No current facility-administered medications for this encounter.     PHYSICAL EXAM: Filed Vitals:   06/03/12 0850  BP: 144/82  Weight: 239 lb (108.41 kg)    General: well appearing. No resp difficulty.  HEENT: normal  Neck: supple. JVP 6-7 Carotids 2+ bilat; no bruits. No lymphadenopathy or thryomegaly appreciated.  Cor: PMI nondisplaced. regular. No rubs, gallops or murmurs.  Lungs: clear  Abdomen: obese, soft, nontender, nondistended.  Good bowel sounds.  Extremities: no cyanosis, clubbing, rash, tr RLE /LLE 2+ +lymphedema  Large gouty nodule R elbow and digits  Neuro: alert & orientedx3, cranial nerves grossly intact. moves all 4 extremities w/o difficulty. Affect pleasant      ASSESSMENT & PLAN:

## 2012-06-03 NOTE — Patient Instructions (Addendum)
Increase hydralazine 25 mg three times daily  Follow up 1 month.  Labs today.

## 2012-07-03 ENCOUNTER — Ambulatory Visit (HOSPITAL_COMMUNITY)
Admission: RE | Admit: 2012-07-03 | Discharge: 2012-07-03 | Disposition: A | Discharge status: Home / Self Care | Payer: Medicare Other | Source: Ambulatory Visit | Attending: Internal Medicine | Admitting: Internal Medicine

## 2012-07-03 ENCOUNTER — Encounter (HOSPITAL_COMMUNITY): Payer: Self-pay

## 2012-07-03 VITALS — BP 148/82 | HR 91 | Resp 19 | Ht 69.0 in | Wt 233.1 lb

## 2012-07-03 DIAGNOSIS — I5030 Unspecified diastolic (congestive) heart failure: Secondary | ICD-10-CM | POA: Diagnosis not present

## 2012-07-03 DIAGNOSIS — I5032 Chronic diastolic (congestive) heart failure: Secondary | ICD-10-CM | POA: Diagnosis not present

## 2012-07-03 DIAGNOSIS — Z79899 Other long term (current) drug therapy: Secondary | ICD-10-CM | POA: Insufficient documentation

## 2012-07-03 DIAGNOSIS — I10 Essential (primary) hypertension: Secondary | ICD-10-CM | POA: Diagnosis not present

## 2012-07-03 DIAGNOSIS — I4891 Unspecified atrial fibrillation: Secondary | ICD-10-CM

## 2012-07-03 DIAGNOSIS — I130 Hypertensive heart and chronic kidney disease with heart failure and stage 1 through stage 4 chronic kidney disease, or unspecified chronic kidney disease: Secondary | ICD-10-CM | POA: Insufficient documentation

## 2012-07-03 DIAGNOSIS — I517 Cardiomegaly: Secondary | ICD-10-CM | POA: Diagnosis not present

## 2012-07-03 DIAGNOSIS — Z7982 Long term (current) use of aspirin: Secondary | ICD-10-CM | POA: Insufficient documentation

## 2012-07-03 DIAGNOSIS — M109 Gout, unspecified: Secondary | ICD-10-CM | POA: Diagnosis not present

## 2012-07-03 DIAGNOSIS — I509 Heart failure, unspecified: Secondary | ICD-10-CM | POA: Diagnosis not present

## 2012-07-03 DIAGNOSIS — N183 Chronic kidney disease, stage 3 unspecified: Secondary | ICD-10-CM | POA: Diagnosis not present

## 2012-07-03 MED ORDER — HYDRALAZINE HCL 50 MG PO TABS: 50.0000 mg | ORAL_TABLET | Freq: Three times a day (TID) | ORAL | Status: DC

## 2012-07-03 NOTE — Assessment & Plan Note (Addendum)
Volume status looks good. Creatinine a bit labile. Will cut demadex back to 20 mg daily and will repeat BMET next week. Reinforced need for daily weights and reviewed use of sliding scale diuretics.Follow up 2 months and knows to call if weight increases.

## 2012-07-03 NOTE — Assessment & Plan Note (Addendum)
Back in AF with good rate control. Tolerating well. Will pursue rate control strategy for now.  She has been on Xarelto from samples and denied in patient assistance program. Trying to switch patient to Eliquis and see if she meets criteria for their patient assistance program. If patient remains in Afib complicated volume management can repeat DC-CV with anti-arrhythmic support.

## 2012-07-03 NOTE — Patient Instructions (Addendum)
Doing great.  Continue demadex 20 mg daily.  Increase hydralazine to 25 mg three times a day, call if you notice any increased dizziness.  Follow up 1 month.   Get BMET checked at [facility] next week

## 2012-07-03 NOTE — Assessment & Plan Note (Addendum)
BP remains elevated, will increase hydralazine to 50 mg TID.

## 2012-07-06 NOTE — Progress Notes (Signed)
Patient ID: [name], female   DOB: [birth year], 42 y.o.   MRN: [id number] PCP: Dr Loleta Chance  HPI: [name] is a 42 yo AAF with history of HTN, gout, morbid obesity, atrial fibrillation and diastolic heart failure. S/P successful. DCCV 01/15/12  12/28/11 Echo: diastolic heart failure with ejection fraction of 55-60%, severe LVH, Mildly dilated RV. Mildly to mod dilated RA. moderate pericardial effusion and echocardiographic changes consistent with a possible infiltrative disease. PAPP 72 mmHg   Milrinone stopped 10/30 after RHC c/w high output HF.  RA = 16  RV = 81/14/20  PA = 78/32 (52)  PCW = 21  Fick cardiac output/index = 11.9/4.6  Thermo CO/CI = 9.8/3.8  PVR = 2.6 Woods (Fick)  FA sat = 91%  PA sat = 65%, 72%  SVC sat = 73%  RA sat = 66%   Admitted to [facility] on 10/14 for massive volume overload. Transferred to [facility] on 10/26 due to worsening renal function, weakness and volume overload. SPEP/UPEP/fat pad biopsy/bone marrow negative for amyloid. Echo findings thought to be combination of severe HTN and PAH related to obesity-hypoventilation syndrome. Bubble study +. Suspect opening of PFO in setting of PAH. Diuresed ~100 pounds while in house. Cr seems to be new baseline ~2.5. Discharged from [facility] 01/04/12 to [facility]. Discharged from [facility]  01/25/12. D/C  weight was 229 pounds.    Follow up: Last visit started hydralazine 25 mg TID and reports she is tolerating well. Cr. 4/8 1.9, up from 1.48, decreased torsemide to 20 mg daily. Weight down another 6 lbs. Home weight 230-233. Feeling very good and denies dyspnea, orthopnea, or PND. Walking daily for about 10 min a day. Denies dizziness. Reports R shoulder bothers her at times with pain.    ROS: All systems negative except as listed in HPI, PMH and Problem List.  Past Medical History  Diagnosis Date  . Essential hypertension, benign     History of accelerated hypertension with urgency  . Atrial flutter     Documented  2006 - [facility]  . Gout   . History of cardiac catheterization     Ectatic coronaries without obstruction 2006 - [facility]  . Morbid obesity   . Hypertensive heart disease     LVEF reportedly 50-55% in 2006 - [facility]  . CKD (chronic kidney disease) stage 3, GFR 30-59 ml/min   . Anemia     Current Outpatient Prescriptions  Medication Sig Dispense Refill  . acetaminophen (TYLENOL) 325 MG tablet Take 2 tablets (650 mg total) by mouth every 4 (four) hours as needed.  30 tablet  0  . allopurinol (ZYLOPRIM) 100 MG tablet Take 200 mg by mouth daily.      Marland Kitchen amLODipine (NORVASC) 10 MG tablet Take 1 tablet (10 mg total) by mouth daily.  30 tablet  0  . aspirin 81 MG tablet Take 81 mg by mouth daily.        . cloNIDine (CATAPRES) 0.2 MG tablet Take 0.2 mg by mouth 3 (three) times daily.      . folic acid (FOLVITE) 1 MG tablet Take 1 tablet (1 mg total) by mouth daily.  30 tablet  0  . hydrALAZINE (APRESOLINE) 50 MG tablet Take 1 tablet (50 mg total) by mouth 3 (three) times daily.  90 tablet  3  . HYDROcodone-acetaminophen (NORCO/VICODIN) 5-325 MG per tablet Take 1 tablet by mouth every 4 (four) hours as needed.  30 tablet  0  . metolazone (ZAROXOLYN) 2.5  MG tablet Take 1 tablet (2.5 mg total) by mouth as needed.  10 tablet  3  . pantoprazole (PROTONIX) 40 MG tablet Take 1 tablet (40 mg total) by mouth daily.  30 tablet  0  . potassium chloride (K-DUR) 10 MEQ tablet Take 2 tablets (20 mEq total) by mouth daily.  30 tablet  3  . rivaroxaban (XARELTO) 10 MG TABS tablet Take 20 mg by mouth daily with supper.      . torsemide (DEMADEX) 20 MG tablet Take 40 mg by mouth daily.       No current facility-administered medications for this encounter.     PHYSICAL EXAM: Filed Vitals:   07/03/12 1018  BP: 148/82  Pulse: 91  Resp: 19  Height: 5\' 9"  (1.753 m)  Weight: 233 lb 1.9 oz (105.743 kg)  SpO2: 100%    General: well appearing. No resp difficulty.  HEENT: normal  Neck: supple. JVP 7-8 Carotids 2+  bilat; no bruits. No lymphadenopathy or thryomegaly appreciated.  Cor: PMI nondisplaced. regular. No rubs, gallops or murmurs.  Lungs: clear  Abdomen: obese, soft, nontender, nondistended. Good bowel sounds.  Extremities: no cyanosis, clubbing, rash, tr RLE /LLE 1+ +lymphedema  Large gouty nodule R elbow and digits  Neuro: alert & orientedx3, cranial nerves grossly intact. moves all 4 extremities w/o difficulty. Affect pleasant      ASSESSMENT & PLAN:

## 2012-09-15 ENCOUNTER — Ambulatory Visit (HOSPITAL_COMMUNITY): Payer: Medicare Other

## 2012-09-17 ENCOUNTER — Ambulatory Visit (HOSPITAL_COMMUNITY)
Admission: RE | Admit: 2012-09-17 | Discharge: 2012-09-17 | Disposition: A | Discharge status: Home / Self Care | Payer: Medicare Other | Source: Ambulatory Visit | Attending: Cardiology | Admitting: Cardiology

## 2012-09-17 VITALS — BP 140/90 | HR 79 | Wt 232.0 lb

## 2012-09-17 DIAGNOSIS — I129 Hypertensive chronic kidney disease with stage 1 through stage 4 chronic kidney disease, or unspecified chronic kidney disease: Secondary | ICD-10-CM | POA: Insufficient documentation

## 2012-09-17 DIAGNOSIS — I5032 Chronic diastolic (congestive) heart failure: Secondary | ICD-10-CM | POA: Diagnosis not present

## 2012-09-17 DIAGNOSIS — N183 Chronic kidney disease, stage 3 unspecified: Secondary | ICD-10-CM | POA: Insufficient documentation

## 2012-09-17 DIAGNOSIS — I10 Essential (primary) hypertension: Secondary | ICD-10-CM | POA: Diagnosis not present

## 2012-09-17 DIAGNOSIS — I5030 Unspecified diastolic (congestive) heart failure: Secondary | ICD-10-CM | POA: Diagnosis not present

## 2012-09-17 DIAGNOSIS — I4891 Unspecified atrial fibrillation: Secondary | ICD-10-CM

## 2012-09-17 MED ORDER — HYDRALAZINE HCL 50 MG PO TABS: 75.0000 mg | ORAL_TABLET | Freq: Three times a day (TID) | ORAL | Status: DC

## 2012-09-17 NOTE — Progress Notes (Signed)
Patient ID: [name], female   DOB: 1970/04/15, 42 y.o.   MRN: [id number] PCP: Dr Loleta Chance  HPI: [name] is a 42 yo AAF with history of HTN, gout, morbid obesity, chronic atrial fibrillation -on eliquis (had DC-CV 01/15/12), and diastolic heart failure.  12/28/11 Echo: diastolic heart failure with ejection fraction of 55-60%, severe LVH, Mildly dilated RV. Mildly to mod dilated RA. moderate pericardial effusion and echocardiographic changes consistent with a possible infiltrative disease. PAPP 72 mmHg   Milrinone stopped 10/30 after RHC c/w high output HF.  RA = 16  RV = 81/14/20  PA = 78/32 (52)  PCW = 21  Fick cardiac output/index = 11.9/4.6  Thermo CO/CI = 9.8/3.8  PVR = 2.6 Woods (Fick)  FA sat = 91%  PA sat = 65%, 72%  SVC sat = 73%  RA sat = 66%   Admitted to [facility] on 10/14 for massive volume overload. Transferred to [facility] on 10/26 due to worsening renal function, weakness and volume overload. SPEP/UPEP/fat pad biopsy/bone marrow negative for amyloid. Echo findings thought to be combination of severe HTN and PAH related to obesity-hypoventilation syndrome. Bubble study +. Suspect opening of PFO in setting of PAH. Diuresed ~100 pounds while in house. Cr seems to be new baseline ~2.5. Discharged from [facility] 01/04/12 to [facility]. Discharged from [facility]  01/25/12. D/C  weight was 229 pounds.    [name] Last visit demadex was cut back to 20 mg daily. She has not required additional torsemide. Overall she feels great.  Denies SOB/PND/Orthopnea. Able to walk 1 hour continuously. Able to walk up steps. Compliant with medications.  Weight at home 230-232 pounds. No bleeding problems.    ROS: All systems negative except as listed in HPI, PMH and Problem List.  Past Medical History  Diagnosis Date  . Essential hypertension, benign     History of accelerated hypertension with urgency  . Atrial flutter     Documented 2006 - [facility]  . Gout   . History of  cardiac catheterization     Ectatic coronaries without obstruction 2006 - [facility]  . Morbid obesity   . Hypertensive heart disease     LVEF reportedly 50-55% in 2006 - [facility]  . CKD (chronic kidney disease) stage 3, GFR 30-59 ml/min   . Anemia     Current Outpatient Prescriptions  Medication Sig Dispense Refill  . acetaminophen (TYLENOL) 325 MG tablet Take 2 tablets (650 mg total) by mouth every 4 (four) hours as needed.  30 tablet  0  . allopurinol (ZYLOPRIM) 100 MG tablet Take 200 mg by mouth daily.      Marland Kitchen amLODipine (NORVASC) 10 MG tablet Take 1 tablet (10 mg total) by mouth daily.  30 tablet  0  . apixaban (ELIQUIS) 5 MG TABS tablet Take 5 mg by mouth 2 (two) times daily.      Marland Kitchen aspirin 81 MG tablet Take 81 mg by mouth daily.        . cloNIDine (CATAPRES) 0.2 MG tablet Take 0.2 mg by mouth 3 (three) times daily.      . folic acid (FOLVITE) 1 MG tablet Take 1 tablet (1 mg total) by mouth daily.  30 tablet  0  . hydrALAZINE (APRESOLINE) 50 MG tablet Take 1 tablet (50 mg total) by mouth 3 (three) times daily.  90 tablet  3  . pantoprazole (PROTONIX) 40 MG tablet Take 1 tablet (40 mg total) by mouth daily.  30  tablet  0  . potassium chloride (K-DUR) 10 MEQ tablet Take 2 tablets (20 mEq total) by mouth daily.  30 tablet  3  . HYDROcodone-acetaminophen (NORCO/VICODIN) 5-325 MG per tablet Take 1 tablet by mouth every 4 (four) hours as needed.  30 tablet  0  . metolazone (ZAROXOLYN) 2.5 MG tablet Take 1 tablet (2.5 mg total) by mouth as needed.  10 tablet  3  . rivaroxaban (XARELTO) 10 MG TABS tablet Take 20 mg by mouth daily with supper.      . torsemide (DEMADEX) 20 MG tablet Take 20 mg by mouth daily.        No current facility-administered medications for this encounter.     PHYSICAL EXAM: Filed Vitals:   09/17/12 0911  BP: 140/90  Weight: 232 lb (105.235 kg)    General: Well appearing. No resp difficulty.  HEENT: normal  Neck: supple. JVP 5-6 Carotids 2+ bilat; no bruits. No  lymphadenopathy or thryomegaly appreciated.  Cor: PMI nondisplaced. Irregular. No rubs, gallops or murmurs.  Lungs: clear  Abdomen: obese, soft, nontender, nondistended. Good bowel sounds.  Extremities: no cyanosis, clubbing, rash,  RLE /LLE  Large gouty nodule R elbow and digits  Neuro: alert & orientedx3, cranial nerves grossly intact. moves all 4 extremities w/o difficulty. Affect pleasant  EKG: A-Fib 79 BPM      ASSESSMENT & PLAN:

## 2012-09-17 NOTE — Patient Instructions (Addendum)
Take hydralazine 75 mg three times a day  Do the following things EVERYDAY: 1) Weigh yourself in the morning before breakfast. Write it down and keep it in a log. 2) Take your medicines as prescribed 3) Eat low salt foods-Limit salt (sodium) to 2000 mg per day.  4) Stay as active as you can everyday 5) Limit all fluids for the day to less than 2 liters  Follow up in 4 months

## 2012-09-17 NOTE — Assessment & Plan Note (Signed)
Chronic A fib with controlled rated. Continue apixaban.

## 2012-09-17 NOTE — Assessment & Plan Note (Signed)
She returns for follow up and she is doing great. Volume status stable. Continue current diuretic regimen. Reinforced medication compliance, low salt food choices, and limiting fluid intake to < 2 liters per day. Follow up in 4 months.

## 2012-09-17 NOTE — Assessment & Plan Note (Signed)
SBP> 130. Increase hydralzine to 75 mg three times per day.

## 2013-01-01 ENCOUNTER — Other Ambulatory Visit: Payer: Self-pay

## 2013-01-12 NOTE — Progress Notes (Signed)
Patient ID: [name], female   DOB: 07/08/1970, 42 y.o.   MRN: [id number] PCP: Dr Loleta Chance  HPI: [name] is a 42 yo AAF with history of HTN, gout, morbid obesity, chronic atrial fibrillation -on eliquis (had DC-CV 01/15/12), and diastolic heart failure.   12/28/11 Echo: diastolic heart failure with ejection fraction of 55-60%, severe LVH, Mildly dilated RV. Mildly to mod dilated RA. moderate pericardial effusion and echocardiographic changes consistent with a possible infiltrative disease. PAPP 72 mmHg   Milrinone stopped 10/30 after RHC c/w high output HF.  RA = 16  RV = 81/14/20  PA = 78/32 (52)  PCW = 21  Fick cardiac output/index = 11.9/4.6  Thermo CO/CI = 9.8/3.8  PVR = 2.6 Woods (Fick)  FA sat = 91%  PA sat = 65%, 72%  SVC sat = 73%  RA sat = 66%  Admitted to [facility] on 12/10/11 for massive volume overload. Transferred to [facility] on 10/26 due to worsening renal function, weakness and volume overload. SPEP/UPEP/fat pad biopsy/bone marrow negative for amyloid. Echo findings thought to be combination of severe HTN and PAH related to obesity-hypoventilation syndrome. Bubble study +. Suspect opening of PFO in setting of PAH. Diuresed ~100 pounds while in house. Cr seems to be new baseline ~2.5. Discharged from [facility] 01/04/12 to [facility]. Discharged from [facility]  01/25/12. D/C  weight was 229 pounds.    Follow up: Last visit increased hydralazine to 75 mg TID for elevated BP. Feeling good. Denies SOB, orthopnea, CP or edema. Able to walk around the whole grocery store and go up stairs. No palpitations. Has not needed any metolazone. Weight at home 235 lbs. Taking medications as prescribed and following a low salt diet.   ROS: All systems negative except as listed in HPI, PMH and Problem List.  Past Medical History  Diagnosis Date  . Essential hypertension, benign     History of accelerated hypertension with urgency  . Atrial flutter     Documented 2006 - [facility]  . Gout   .  History of cardiac catheterization     Ectatic coronaries without obstruction 2006 - [facility]  . Morbid obesity   . Hypertensive heart disease     LVEF reportedly 50-55% in 2006 - [facility]  . CKD (chronic kidney disease) stage 3, GFR 30-59 ml/min   . Anemia     Current Outpatient Prescriptions  Medication Sig Dispense Refill  . acetaminophen (TYLENOL) 325 MG tablet Take 2 tablets (650 mg total) by mouth every 4 (four) hours as needed.  30 tablet  0  . allopurinol (ZYLOPRIM) 100 MG tablet Take 200 mg by mouth daily.      Marland Kitchen amLODipine (NORVASC) 10 MG tablet Take 1 tablet (10 mg total) by mouth daily.  30 tablet  0  . apixaban (ELIQUIS) 5 MG TABS tablet Take 5 mg by mouth 2 (two) times daily.      Marland Kitchen aspirin 81 MG tablet Take 81 mg by mouth daily.        . cloNIDine (CATAPRES) 0.2 MG tablet Take 0.2 mg by mouth 3 (three) times daily.      . folic acid (FOLVITE) 1 MG tablet Take 1 tablet (1 mg total) by mouth daily.  30 tablet  0  . hydrALAZINE (APRESOLINE) 50 MG tablet Take 1.5 tablets (75 mg total) by mouth 3 (three) times daily.  135 tablet  3  . HYDROcodone-acetaminophen (NORCO/VICODIN) 5-325 MG per tablet Take 1 tablet by mouth every 4 (  four) hours as needed.  30 tablet  0  . metolazone (ZAROXOLYN) 2.5 MG tablet Take 1 tablet (2.5 mg total) by mouth as needed.  10 tablet  3  . pantoprazole (PROTONIX) 40 MG tablet Take 1 tablet (40 mg total) by mouth daily.  30 tablet  0  . potassium chloride (K-DUR) 10 MEQ tablet Take 2 tablets (20 mEq total) by mouth daily.  30 tablet  3  . torsemide (DEMADEX) 20 MG tablet Take 20 mg by mouth daily.        No current facility-administered medications for this encounter.    Filed Vitals:   01/13/13 0853  BP: 158/86  Pulse: 90  Weight: 240 lb 12 oz (109.203 kg)  SpO2: 100%    PHYSICAL EXAM: General: Well appearing. No resp difficulty. Friend present HEENT: normal, keloid L neck  Neck: supple. JVP 5-6 Carotids 2+ bilat; no bruits. No lymphadenopathy or  thryomegaly appreciated.  Cor: PMI nondisplaced. Irregular. No rubs, gallops or murmurs.  Lungs: clear  Abdomen: obese, soft, nontender, nondistended. Good bowel sounds.  Extremities: no cyanosis, clubbing, rash,  RLE /LLE  Large gouty nodule R elbow and digits  Neuro: alert & orientedx3, cranial nerves grossly intact. moves all 4 extremities w/o difficulty. Affect pleasant   ASSESSMENT & PLAN:  1) Chronic diastolic HF - Doing great. NYHA I symptoms and volume status stable. She is taking her torsemide PRN for weight gain more than 3 lbs overnight.  - Reinforced the need and importance of daily weights, a low sodium diet, and fluid restriction (less than 2 L a day). Instructed to call the HF clinic if weight increases more than 3 lbs overnight or 5 lbs in a week.  - Will get BMET today to assess renal function. 2) Afib - last DC-CV 12/2011 and appears that she went back into fib around 06/2012. She has good rate control and is not having issues with volume management. Will continue eliquis 5 mg BID and discuss with Dr. Gala Romney about whether we should pursue repeat DC-CV. - Can stop ASA 81. 3) HTN - SBP elevated. Will increase hydralazine to 100 mg TID. Continue norvasc 10 mg daily along with clonidine 0.2 mg TID. If BP improves with increase can try weaning clonidine down next visit.  F/U 6 months.  [name] B NP-C 9:55 AM

## 2013-01-13 ENCOUNTER — Ambulatory Visit (HOSPITAL_COMMUNITY)
Admission: RE | Admit: 2013-01-13 | Discharge: 2013-01-13 | Disposition: A | Discharge status: Home / Self Care | Payer: Medicare Other | Source: Ambulatory Visit | Attending: Internal Medicine | Admitting: Internal Medicine

## 2013-01-13 ENCOUNTER — Telehealth (HOSPITAL_COMMUNITY): Payer: Self-pay | Admitting: Anesthesiology

## 2013-01-13 VITALS — BP 158/86 | HR 90 | Wt 240.8 lb

## 2013-01-13 DIAGNOSIS — I5032 Chronic diastolic (congestive) heart failure: Secondary | ICD-10-CM

## 2013-01-13 DIAGNOSIS — I509 Heart failure, unspecified: Secondary | ICD-10-CM | POA: Diagnosis not present

## 2013-01-13 DIAGNOSIS — I4891 Unspecified atrial fibrillation: Secondary | ICD-10-CM | POA: Diagnosis not present

## 2013-01-13 DIAGNOSIS — M109 Gout, unspecified: Secondary | ICD-10-CM | POA: Insufficient documentation

## 2013-01-13 DIAGNOSIS — Z79899 Other long term (current) drug therapy: Secondary | ICD-10-CM | POA: Insufficient documentation

## 2013-01-13 DIAGNOSIS — I10 Essential (primary) hypertension: Secondary | ICD-10-CM | POA: Diagnosis not present

## 2013-01-13 LAB — BASIC METABOLIC PANEL
BUN: 45 mg/dL — ABNORMAL HIGH (ref 6–23)
Chloride: 98 mEq/L (ref 96–112)
Creatinine, Ser: 1.7 mg/dL — ABNORMAL HIGH (ref 0.50–1.10)
GFR calc Af Amer: 42 mL/min — ABNORMAL LOW (ref >90)
GFR calc non Af Amer: 37 mL/min — ABNORMAL LOW (ref >90)

## 2013-01-13 MED ORDER — HYDRALAZINE HCL 100 MG PO TABS: 100.0000 mg | ORAL_TABLET | Freq: Three times a day (TID) | ORAL | Status: DC

## 2013-01-13 NOTE — Patient Instructions (Signed)
Increase hydralazine to 100 mg (2 tablets) three times a day. When you get your new prescription the tablet will be 100 mg and then you will need to only take 1 tablet three times a day.  Will call with lab results.  Take torsemide PRN for weight gain. If you gain more than 3 lbs overnight take a torsemide.   Call if any issues [phone].  F/U 6 months.  Do the following things EVERYDAY: 1) Weigh yourself in the morning before breakfast. Write it down and keep it in a log. 2) Take your medicines as prescribed 3) Eat low salt foods-Limit salt (sodium) to 2000 mg per day.  4) Stay as active as you can everyday 5) Limit all fluids for the day to less than 2 liters 6)

## 2013-01-13 NOTE — Telephone Encounter (Signed)
Called patient to inform her of lab work from today. K+ 3.9 and Cr 1.7. Cr slightly elevated from 04/2012. Reinforced what we discussed today about taking torsemide PRN and to call if she starts having increased weight gain. Also with being on Eliquis instructed to stop taking ASA daily.

## 2013-01-13 NOTE — Addendum Note (Signed)
Encounter addended by: Aundria Rud, NP on: 01/13/2013  4:14 PM<BR>     Documentation filed: Medications

## 2013-01-14 NOTE — Telephone Encounter (Signed)
PT aware, she called back this am to get results.

## 2013-01-15 NOTE — Addendum Note (Signed)
Encounter addended by: Arnella Pralle H Avanish Cerullo, CCT on: 01/15/2013 11:46 AM<BR>     Documentation filed: Charges VN

## 2013-05-26 DIAGNOSIS — R05 Cough: Secondary | ICD-10-CM | POA: Diagnosis not present

## 2013-05-26 DIAGNOSIS — R059 Cough, unspecified: Secondary | ICD-10-CM | POA: Diagnosis not present

## 2013-05-26 DIAGNOSIS — I10 Essential (primary) hypertension: Secondary | ICD-10-CM | POA: Diagnosis not present

## 2013-05-26 DIAGNOSIS — J3489 Other specified disorders of nose and nasal sinuses: Secondary | ICD-10-CM | POA: Diagnosis not present

## 2013-06-02 DIAGNOSIS — I5020 Unspecified systolic (congestive) heart failure: Secondary | ICD-10-CM | POA: Diagnosis not present

## 2013-06-02 DIAGNOSIS — R059 Cough, unspecified: Secondary | ICD-10-CM | POA: Diagnosis not present

## 2013-06-02 DIAGNOSIS — J309 Allergic rhinitis, unspecified: Secondary | ICD-10-CM | POA: Diagnosis not present

## 2013-06-02 DIAGNOSIS — R05 Cough: Secondary | ICD-10-CM | POA: Diagnosis not present

## 2013-06-02 DIAGNOSIS — I10 Essential (primary) hypertension: Secondary | ICD-10-CM | POA: Diagnosis not present

## 2013-06-12 ENCOUNTER — Other Ambulatory Visit (HOSPITAL_COMMUNITY): Payer: Self-pay | Admitting: Family Medicine

## 2013-06-12 DIAGNOSIS — Z1231 Encounter for screening mammogram for malignant neoplasm of breast: Secondary | ICD-10-CM

## 2013-06-12 DIAGNOSIS — Z139 Encounter for screening, unspecified: Secondary | ICD-10-CM

## 2013-06-16 ENCOUNTER — Ambulatory Visit (HOSPITAL_COMMUNITY)
Admission: RE | Admit: 2013-06-16 | Discharge: 2013-06-16 | Disposition: A | Discharge status: Home / Self Care | Payer: Medicare Other | Source: Ambulatory Visit | Attending: Family Medicine | Admitting: Family Medicine

## 2013-06-16 DIAGNOSIS — Z1231 Encounter for screening mammogram for malignant neoplasm of breast: Secondary | ICD-10-CM

## 2013-06-18 DIAGNOSIS — M109 Gout, unspecified: Secondary | ICD-10-CM | POA: Diagnosis not present

## 2013-06-18 DIAGNOSIS — I509 Heart failure, unspecified: Secondary | ICD-10-CM | POA: Diagnosis not present

## 2013-06-18 DIAGNOSIS — I10 Essential (primary) hypertension: Secondary | ICD-10-CM | POA: Diagnosis not present

## 2013-06-26 DIAGNOSIS — I10 Essential (primary) hypertension: Secondary | ICD-10-CM | POA: Diagnosis not present

## 2013-06-26 DIAGNOSIS — I509 Heart failure, unspecified: Secondary | ICD-10-CM | POA: Diagnosis not present

## 2013-07-03 DIAGNOSIS — I4891 Unspecified atrial fibrillation: Secondary | ICD-10-CM | POA: Diagnosis not present

## 2013-07-03 DIAGNOSIS — I509 Heart failure, unspecified: Secondary | ICD-10-CM | POA: Diagnosis not present

## 2013-07-03 DIAGNOSIS — I10 Essential (primary) hypertension: Secondary | ICD-10-CM | POA: Diagnosis not present

## 2013-07-03 DIAGNOSIS — D649 Anemia, unspecified: Secondary | ICD-10-CM | POA: Diagnosis not present

## 2013-07-06 ENCOUNTER — Encounter (HOSPITAL_COMMUNITY): Payer: Self-pay

## 2013-07-24 ENCOUNTER — Encounter (HOSPITAL_COMMUNITY): Payer: Medicare Other

## 2013-07-29 ENCOUNTER — Encounter (HOSPITAL_COMMUNITY): Payer: Self-pay

## 2013-07-29 ENCOUNTER — Inpatient Hospital Stay (HOSPITAL_COMMUNITY): Admission: RE | Admit: 2013-07-29 | Payer: Medicare Other | Source: Ambulatory Visit

## 2013-08-09 ENCOUNTER — Encounter (HOSPITAL_COMMUNITY): Payer: Self-pay | Admitting: Emergency Medicine

## 2013-08-09 ENCOUNTER — Emergency Department (HOSPITAL_COMMUNITY): Payer: Medicare Other

## 2013-08-09 ENCOUNTER — Emergency Department (HOSPITAL_COMMUNITY)
Admission: EM | Admit: 2013-08-09 | Discharge: 2013-08-09 | Disposition: A | Discharge status: Home / Self Care | Payer: Medicare Other | Attending: Emergency Medicine | Admitting: Emergency Medicine

## 2013-08-09 DIAGNOSIS — I1310 Hypertensive heart and chronic kidney disease without heart failure, with stage 1 through stage 4 chronic kidney disease, or unspecified chronic kidney disease: Secondary | ICD-10-CM | POA: Diagnosis not present

## 2013-08-09 DIAGNOSIS — Z79899 Other long term (current) drug therapy: Secondary | ICD-10-CM | POA: Diagnosis not present

## 2013-08-09 DIAGNOSIS — Z880 Allergy status to penicillin: Secondary | ICD-10-CM | POA: Insufficient documentation

## 2013-08-09 DIAGNOSIS — Z862 Personal history of diseases of the blood and blood-forming organs and certain disorders involving the immune mechanism: Secondary | ICD-10-CM | POA: Insufficient documentation

## 2013-08-09 DIAGNOSIS — I10 Essential (primary) hypertension: Secondary | ICD-10-CM | POA: Diagnosis not present

## 2013-08-09 DIAGNOSIS — I120 Hypertensive chronic kidney disease with stage 5 chronic kidney disease or end stage renal disease: Secondary | ICD-10-CM | POA: Diagnosis not present

## 2013-08-09 DIAGNOSIS — R509 Fever, unspecified: Secondary | ICD-10-CM | POA: Diagnosis not present

## 2013-08-09 DIAGNOSIS — M25469 Effusion, unspecified knee: Secondary | ICD-10-CM | POA: Diagnosis not present

## 2013-08-09 DIAGNOSIS — M109 Gout, unspecified: Secondary | ICD-10-CM | POA: Diagnosis not present

## 2013-08-09 DIAGNOSIS — N183 Chronic kidney disease, stage 3 unspecified: Secondary | ICD-10-CM | POA: Insufficient documentation

## 2013-08-09 DIAGNOSIS — Z9889 Other specified postprocedural states: Secondary | ICD-10-CM | POA: Insufficient documentation

## 2013-08-09 LAB — SYNOVIAL CELL COUNT + DIFF, W/ CRYSTALS
Eosinophils-Synovial: 0 % (ref 0–1)
Lymphocytes-Synovial Fld: 2 % (ref 0–20)
Monocyte-Macrophage-Synovial Fluid: 6 % — ABNORMAL LOW (ref 50–90)
Neutrophil, Synovial: 92 % — ABNORMAL HIGH (ref 0–25)
Other Cells-SYN: 0
WBC, Synovial: 11045 /mm3 — ABNORMAL HIGH (ref 0–200)

## 2013-08-09 LAB — GRAM STAIN: Gram Stain: NONE SEEN

## 2013-08-09 MED ORDER — INDOMETHACIN 25 MG PO CAPS: 25.0000 mg | ORAL_CAPSULE | Freq: Three times a day (TID) | ORAL | Status: DC | PRN

## 2013-08-09 MED ORDER — OXYCODONE-ACETAMINOPHEN 5-325 MG PO TABS: 2.0000 | ORAL_TABLET | ORAL | Status: DC | PRN

## 2013-08-09 MED ORDER — LIDOCAINE HCL (PF) 1 % IJ SOLN
INTRAMUSCULAR | Status: AC
  Administered 2013-08-09: 19:00:00
  Filled 2013-08-09: qty 5

## 2013-08-09 MED ORDER — OXYCODONE-ACETAMINOPHEN 5-325 MG PO TABS
2.0000 | ORAL_TABLET | Freq: Once | ORAL | Status: AC
  Administered 2013-08-09: 2 via ORAL
  Filled 2013-08-09: qty 2

## 2013-08-09 MED ORDER — PREDNISONE 10 MG PO TABS: 20.0000 mg | ORAL_TABLET | Freq: Two times a day (BID) | ORAL | Status: DC

## 2013-08-09 NOTE — ED Provider Notes (Signed)
CSN: [id number]     Arrival date & time 08/09/13  1728 History  This chart was scribed for Veryl Speak, MD by Ludger Nutting, ED Scribe. This patient was seen in room [facility] and the patient's care was started 5:50 PM.    Chief Complaint  Patient presents with  . Knee Pain      The history is provided by the patient. No language interpreter was used.    HPI Comments: [name] is a 43 y.o. female with past medical history of gout, essential HTN who presents to the Emergency Department complaining of 2 days of gradual onset, gradually worsening, constant left knee pain. She denies recent injuries, falls, or trauma to the affected knee. She denies history of similar symptoms in the past. She states the pain is worse with bearing weight and ambulation. She has been using crutches to assist with ambulation. She denies fever at home but her temperature in the ED today is 100 F.   Past Medical History  Diagnosis Date  . Essential hypertension, benign     History of accelerated hypertension with urgency  . Atrial flutter     Documented 2006 - [facility]  . Gout   . History of cardiac catheterization     Ectatic coronaries without obstruction 2006 - [facility]  . Morbid obesity   . Hypertensive heart disease     LVEF reportedly 50-55% in 2006 - [place]  . CKD (chronic kidney disease) stage 3, GFR 30-59 ml/min   . Anemia    Past Surgical History  Procedure Laterality Date  . Skin biopsy  12/18/2011    Procedure: BIOPSY SKIN;  Surgeon: Donato Heinz, MD;  Location: [facility];  Service: General;  Laterality: N/A;  in the minor room.  Darden Dates without cardioversion  01/15/2012    Procedure: TRANSESOPHAGEAL ECHOCARDIOGRAM (TEE);  Surgeon: Jolaine Artist, MD;  Location: [facility] ENDOSCOPY;  Service: Cardiovascular;  Laterality: N/A;  cardioversion/on xarelto  . Cardioversion  01/15/2012    Procedure: CARDIOVERSION;  Surgeon: Jolaine Artist, MD;  Location: [facility] ENDOSCOPY;  Service: Cardiovascular;   Laterality: N/A;   Family History  Problem Relation Age of Onset  . Hypertension    . Hypertension Mother   . Diabetes Mother    History  Substance Use Topics  . Smoking status: Never Smoker   . Smokeless tobacco: Never Used  . Alcohol Use: No   OB History   Grav Para Term Preterm Abortions TAB SAB Ect Mult Living            0     Review of Systems  Constitutional: Positive for fever.  Musculoskeletal: Positive for arthralgias (left knee pain).  Neurological: Negative for weakness and numbness.  All other systems reviewed and are negative.     Allergies  Penicillins  Home Medications   Prior to Admission medications   Medication Sig Start Date End Date Taking? Authorizing Provider  acetaminophen (TYLENOL) 325 MG tablet Take 2 tablets (650 mg total) by mouth every 4 (four) hours as needed. 01/04/12   Theodis Blaze, MD  allopurinol (ZYLOPRIM) 100 MG tablet Take 200 mg by mouth daily. 01/04/12   Theodis Blaze, MD  amLODipine (NORVASC) 10 MG tablet Take 1 tablet (10 mg total) by mouth daily. 12/20/11   Nimish Luther Parody, MD  apixaban (ELIQUIS) 5 MG TABS tablet Take 5 mg by mouth 2 (two) times daily.    Historical Provider, MD  cloNIDine (CATAPRES) 0.2 MG tablet Take  0.2 mg by mouth 3 (three) times daily.    Historical Provider, MD  folic acid (FOLVITE) 1 MG tablet Take 1 tablet (1 mg total) by mouth daily. 12/20/11   Nimish Luther Parody, MD  hydrALAZINE (APRESOLINE) 100 MG tablet Take 1 tablet (100 mg total) by mouth 3 (three) times daily. 01/13/13   Rande Brunt, NP  HYDROcodone-acetaminophen (NORCO/VICODIN) 5-325 MG per tablet Take 1 tablet by mouth every 4 (four) hours as needed. 01/04/12   Theodis Blaze, MD  metolazone (ZAROXOLYN) 2.5 MG tablet Take 1 tablet (2.5 mg total) by mouth as needed. 05/15/12   Amy D Clegg, NP  pantoprazole (PROTONIX) 40 MG tablet Take 1 tablet (40 mg total) by mouth daily. 12/20/11   Nimish Luther Parody, MD  potassium chloride (K-DUR) 10 MEQ tablet Take  2 tablets (20 mEq total) by mouth daily. 02/06/12   Amy D Clegg, NP  torsemide (DEMADEX) 20 MG tablet Take 20 mg by mouth daily.  05/15/12   Amy D Clegg, NP   BP 175/104  Pulse 99  Temp(Src) 100 F (37.8 C) (Oral)  Resp 18  Ht 5\' 11"  (1.803 m)  Wt 240 lb (108.863 kg)  BMI 33.49 kg/m2  SpO2 100%  LMP 07/19/2013 Physical Exam  Nursing note and vitals reviewed. Constitutional: She is oriented to person, place, and time. She appears well-developed and well-nourished.  HENT:  Head: Normocephalic and atraumatic.  Eyes: Conjunctivae and EOM are normal.  Neck: Normal range of motion. No tracheal deviation present.  Cardiovascular: Normal rate, regular rhythm and normal heart sounds.   Pulmonary/Chest: Effort normal and breath sounds normal. No respiratory distress. She has no wheezes. She has no rales.  Abdominal: She exhibits no distension.  Musculoskeletal:       Left knee: She exhibits decreased range of motion (due to pain) and effusion.  Left knee has a moderate sized effusion. ROM is limited due to pain. Otherwise the knee joint appears stable. Anterior and posterior drawer test negative.   Neurological: She is alert and oriented to person, place, and time.  Skin: Skin is warm and dry.  Psychiatric: She has a normal mood and affect.    ED Course  Procedures (including critical care time)  DIAGNOSTIC STUDIES: Oxygen Saturation is 100% on RA, normal by my interpretation.    COORDINATION OF CARE: 5:58 PM Will order XRAY of left knee. Discussed treatment plan with pt at bedside and pt agreed to plan.   Labs Review Labs Reviewed - No data to display  Imaging Review No results found.   EKG Interpretation None     ARTHOCENTESIS Performed by: Veryl Speak Consent: Verbal consent obtained. Risks and benefits: risks, benefits and alternatives were discussed Consent given by: patient Required items: required blood products, implants, devices, and special equipment  available Patient identity confirmed: verbally with patient Time out: Immediately prior to procedure a "time out" was called to verify the correct patient, procedure, equipment, support staff and site/side marked as required. Indications: left knee effusion, fever Joint: left knee Local anesthesia used: 1% lidocaine Preparation: Patient was prepped and draped in the usual sterile fashion. Aspirate appearance: yellow Aspirate amount: 20 ml Patient tolerance: Patient tolerated the procedure well with no immediate complications.    MDM   Final diagnoses:  None    Patient presents with a painful, swollen left knee. On exam she has a moderate to large effusion. Arthrocentesis of his knee revealed 20 cc of yellowish fluid that appeared mostly clear.  It was sent for analysis and revealed white count of 11,000 along with monosodium urate crystals consistent with gout. She will be treated with indomethacin and Percocet and advised to return or followup if not improving.  I personally performed the services described in this documentation, which was scribed in my presence. The recorded information has been reviewed and is accurate.      Veryl Speak, MD 08/09/13 2029

## 2013-08-09 NOTE — Discharge Instructions (Signed)
Prednisone as prescribed. Percocet as prescribed as needed for pain.  Followup with your primary Dr. in the next 3-4 days if not improving, and return to the ER if your symptoms substantially worsen or change in the meantime.   Gout Gout is an inflammatory arthritis caused by a buildup of uric acid crystals in the joints. Uric acid is a chemical that is normally present in the blood. When the level of uric acid in the blood is too high it can form crystals that deposit in your joints and tissues. This causes joint redness, soreness, and swelling (inflammation). Repeat attacks are common. Over time, uric acid crystals can form into masses (tophi) near a joint, destroying bone and causing disfigurement. Gout is treatable and often preventable. CAUSES  The disease begins with elevated levels of uric acid in the blood. Uric acid is produced by your body when it breaks down a naturally found substance called purines. Certain foods you eat, such as meats and fish, contain high amounts of purines. Causes of an elevated uric acid level include:  Being passed down from parent to child (heredity).  Diseases that cause increased uric acid production (such as obesity, psoriasis, and certain cancers).  Excessive alcohol use.  Diet, especially diets rich in meat and seafood.  Medicines, including certain cancer-fighting medicines (chemotherapy), water pills (diuretics), and aspirin.  Chronic kidney disease. The kidneys are no longer able to remove uric acid well.  Problems with metabolism. Conditions strongly associated with gout include:  Obesity.  High blood pressure.  High cholesterol.  Diabetes. Not everyone with elevated uric acid levels gets gout. It is not understood why some people get gout and others do not. Surgery, joint injury, and eating too much of certain foods are some of the factors that can lead to gout attacks. SYMPTOMS   An attack of gout comes on quickly. It causes intense  pain with redness, swelling, and warmth in a joint.  Fever can occur.  Often, only one joint is involved. Certain joints are more commonly involved:  Base of the big toe.  Knee.  Ankle.  Wrist.  Finger. Without treatment, an attack usually goes away in a few days to weeks. Between attacks, you usually will not have symptoms, which is different from many other forms of arthritis. DIAGNOSIS  Your caregiver will suspect gout based on your symptoms and exam. In some cases, tests may be recommended. The tests may include:  Blood tests.  Urine tests.  X-rays.  Joint fluid exam. This exam requires a needle to remove fluid from the joint (arthrocentesis). Using a microscope, gout is confirmed when uric acid crystals are seen in the joint fluid. TREATMENT  There are two phases to gout treatment: treating the sudden onset (acute) attack and preventing attacks (prophylaxis).  Treatment of an Acute Attack.  Medicines are used. These include anti-inflammatory medicines or steroid medicines.  An injection of steroid medicine into the affected joint is sometimes necessary.  The painful joint is rested. Movement can worsen the arthritis.  You may use warm or cold treatments on painful joints, depending which works best for you.  Treatment to Prevent Attacks.  If you suffer from frequent gout attacks, your caregiver may advise preventive medicine. These medicines are started after the acute attack subsides. These medicines either help your kidneys eliminate uric acid from your body or decrease your uric acid production. You may need to stay on these medicines for a very long time.  The early phase of treatment  with preventive medicine can be associated with an increase in acute gout attacks. For this reason, during the first few months of treatment, your caregiver may also advise you to take medicines usually used for acute gout treatment. Be sure you understand your caregiver's directions.  Your caregiver may make several adjustments to your medicine dose before these medicines are effective.  Discuss dietary treatment with your caregiver or dietitian. Alcohol and drinks high in sugar and fructose and foods such as meat, poultry, and seafood can increase uric acid levels. Your caregiver or dietician can advise you on drinks and foods that should be limited. HOME CARE INSTRUCTIONS   Do not take aspirin to relieve pain. This raises uric acid levels.  Only take over-the-counter or prescription medicines for pain, discomfort, or fever as directed by your caregiver.  Rest the joint as much as possible. When in bed, keep sheets and blankets off painful areas.  Keep the affected joint raised (elevated).  Apply warm or cold treatments to painful joints. Use of warm or cold treatments depends on which works best for you.  Use crutches if the painful joint is in your leg.  Drink enough fluids to keep your urine clear or pale yellow. This helps your body get rid of uric acid. Limit alcohol, sugary drinks, and fructose drinks.  Follow your dietary instructions. Pay careful attention to the amount of protein you eat. Your daily diet should emphasize fruits, vegetables, whole grains, and fat-free or low-fat milk products. Discuss the use of coffee, vitamin C, and cherries with your caregiver or dietician. These may be helpful in lowering uric acid levels.  Maintain a healthy body weight. SEEK MEDICAL CARE IF:   You develop diarrhea, vomiting, or any side effects from medicines.  You do not feel better in 24 hours, or you are getting worse. SEEK IMMEDIATE MEDICAL CARE IF:   Your joint becomes suddenly more tender, and you have chills or a fever. MAKE SURE YOU:   Understand these instructions.  Will watch your condition.  Will get help right away if you are not doing well or get worse. Document Released: 02/10/2000 Document Revised: 06/09/2012 Document Reviewed:  09/26/2011 [facility] Patient Information 2014 [place].

## 2013-08-09 NOTE — ED Notes (Signed)
Patient c/o left knee pain and swelling. Patient denies any known injuries.

## 2013-08-13 LAB — BODY FLUID CULTURE: CULTURE: NO GROWTH

## 2013-09-18 ENCOUNTER — Other Ambulatory Visit (HOSPITAL_COMMUNITY): Payer: Self-pay | Admitting: Adult Health

## 2014-02-04 ENCOUNTER — Encounter (HOSPITAL_COMMUNITY): Payer: Self-pay | Admitting: Internal Medicine

## 2014-03-08 ENCOUNTER — Other Ambulatory Visit (HOSPITAL_COMMUNITY): Payer: Self-pay | Admitting: *Deleted

## 2014-03-08 MED ORDER — HYDRALAZINE HCL 100 MG PO TABS: 100.0000 mg | ORAL_TABLET | Freq: Three times a day (TID) | ORAL | Status: DC

## 2014-03-11 ENCOUNTER — Encounter (HOSPITAL_COMMUNITY): Payer: Self-pay | Admitting: Internal Medicine

## 2014-07-28 ENCOUNTER — Other Ambulatory Visit (HOSPITAL_COMMUNITY): Payer: Self-pay | Admitting: Adult Health

## 2014-08-19 IMAGING — CR DG CHEST 1V PORT
1 series · 1 of 1 positions shown · non-contrast
Comparison: 01/21/2005

CLINICAL DATA: Chest pain and shortness of breath.  Bilateral lower
extremity swelling.

PORTABLE CHEST - 1 VIEW

[view not recorded]
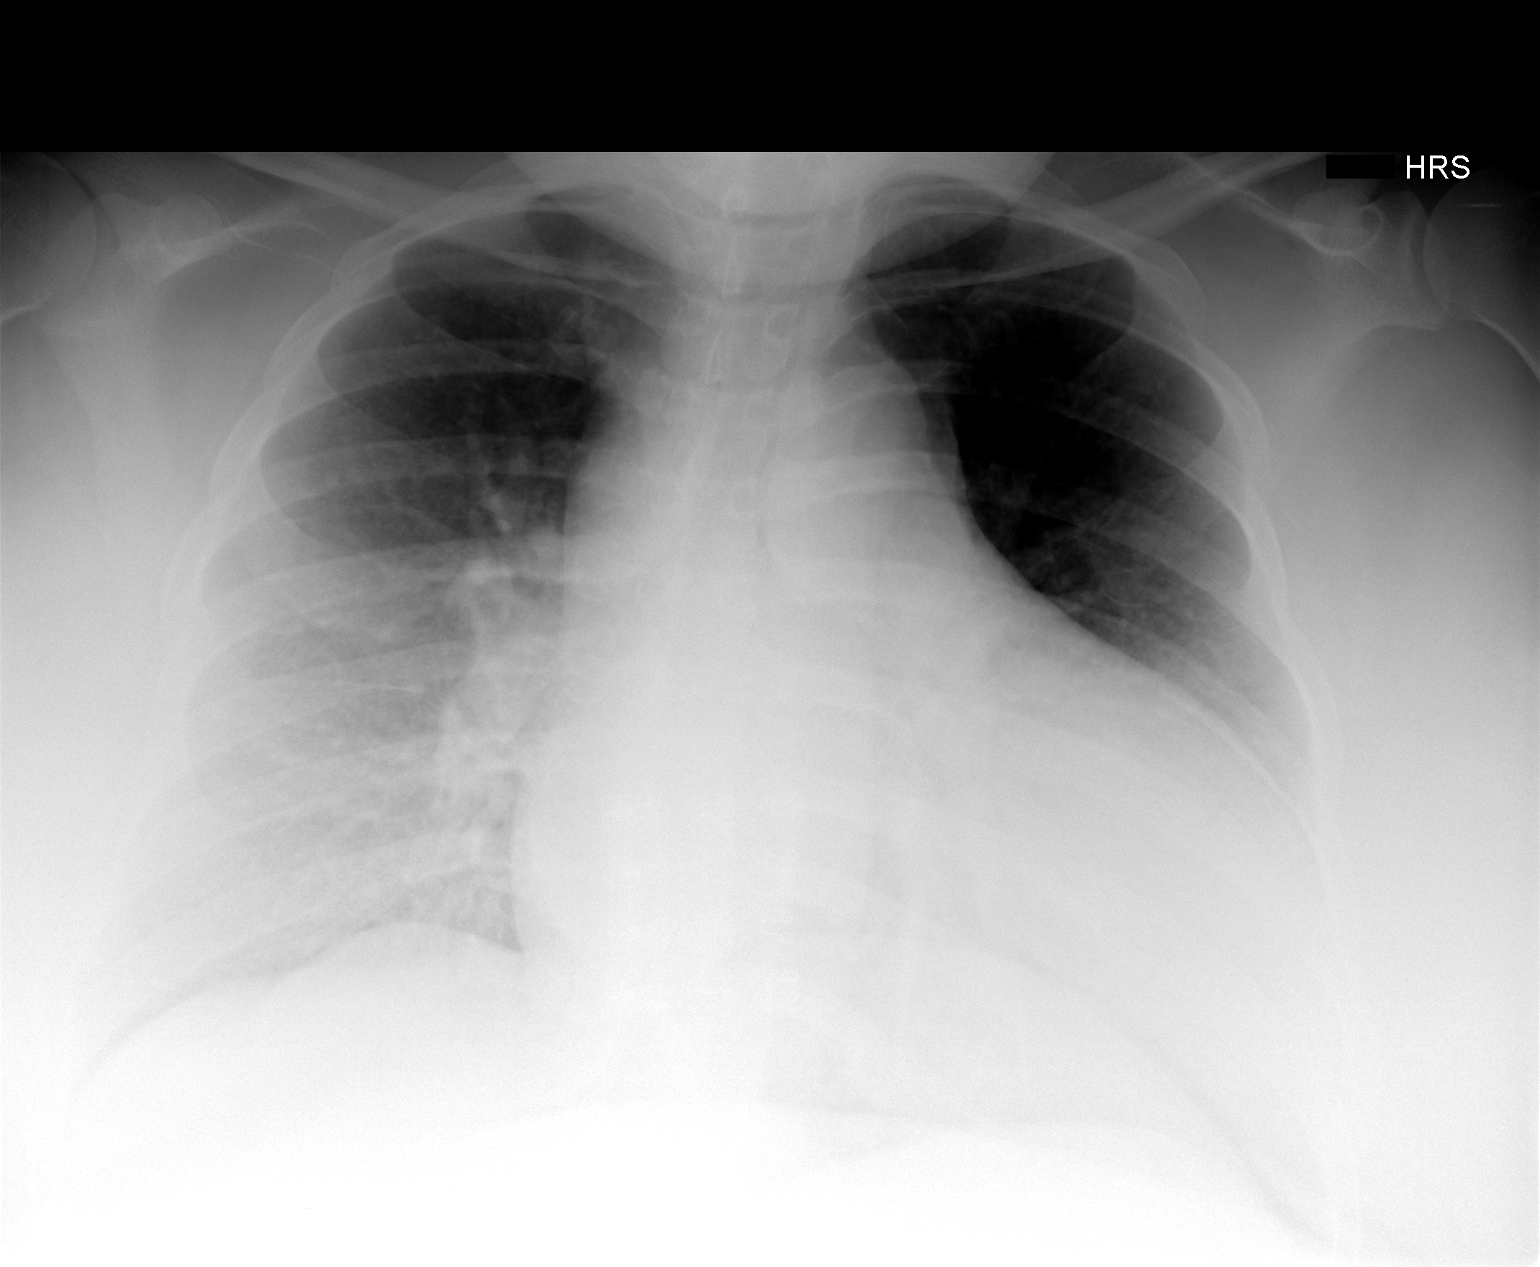

[1 of 1 positions shown; findings below may reference images not displayed]

FINDINGS: Shallow inspiration.  Cardiac enlargement.  Pulmonary
vascularity is normal.  No focal airspace consolidation in the
lungs.  No blunting of costophrenic angles.  No pneumothorax.
Mediastinal contours appear intact.  Right paratracheal fullness is
likely vascular.  No significant change since previous study.
IMPRESSION: Cardiac enlargement without pulmonary vascular congestion.  Lungs
clear.

## 2014-08-23 IMAGING — US US ABDOMEN COMPLETE
1 series · 13 of 25 positions shown · non-contrast
Comparison: Renal ultrasound, 1999

CLINICAL DATA: Amyloidosis.  Evaluate for organomegaly

COMPLETE ABDOMINAL ULTRASOUND

[Series 1: us abdomen complete · 0.30mm/px · 13 of 119 slices shown]
[im 1/119]
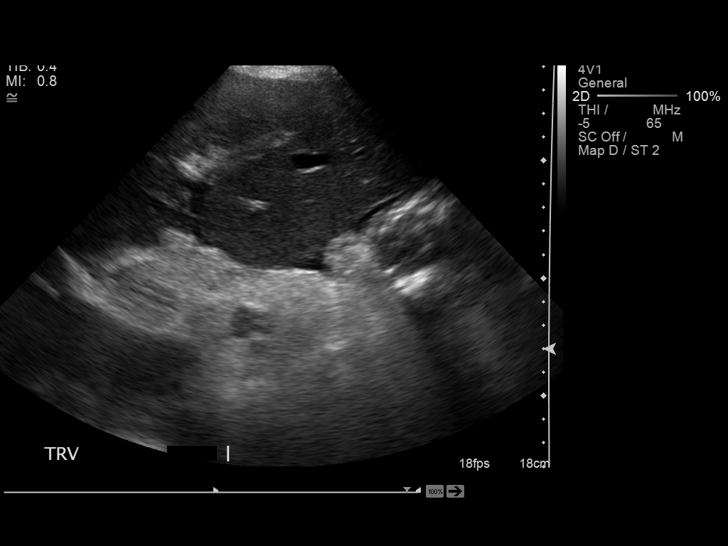
[im 10/119]
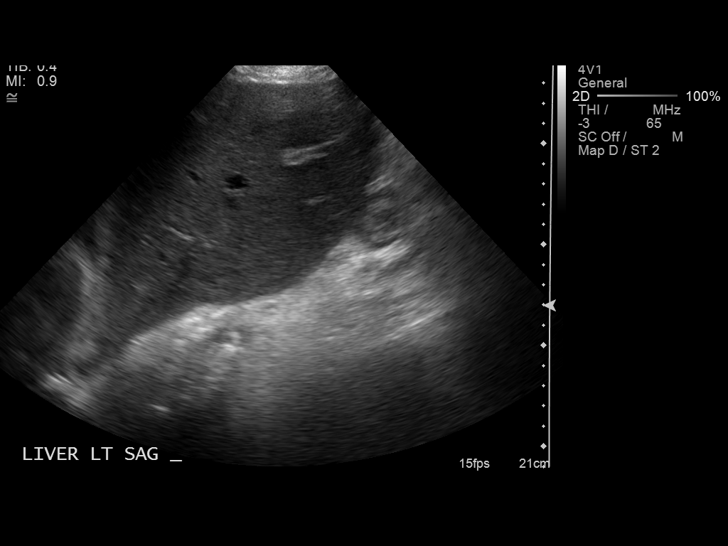
[im 20/119]
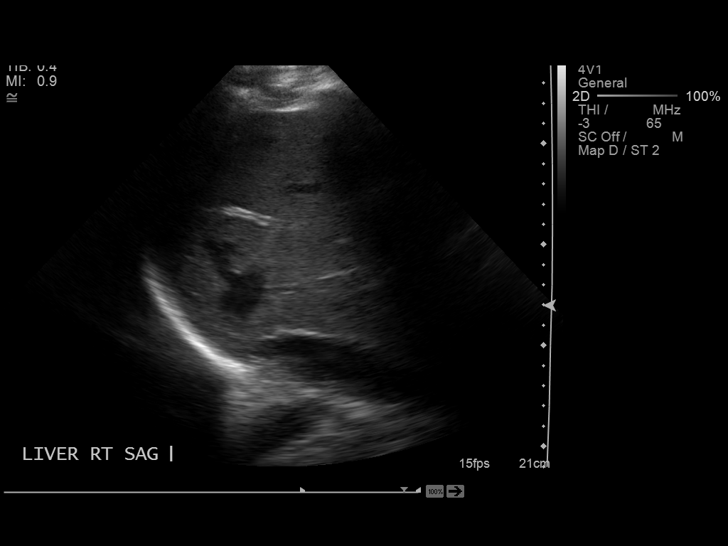
[im 30/119]
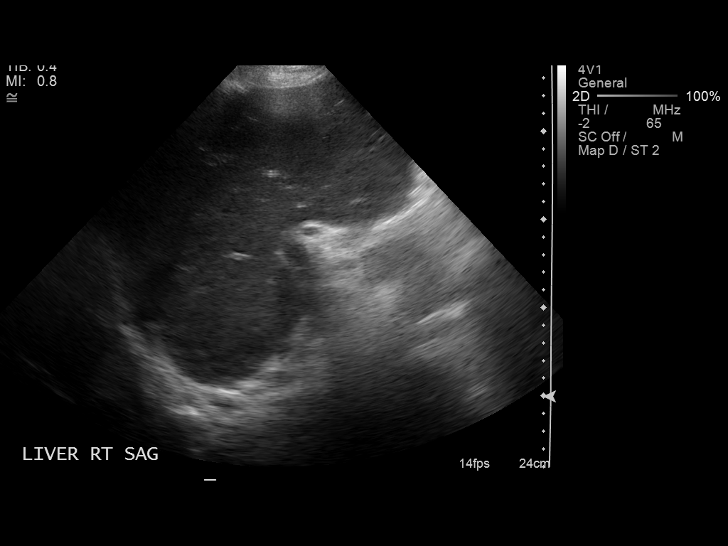
[im 40/119]
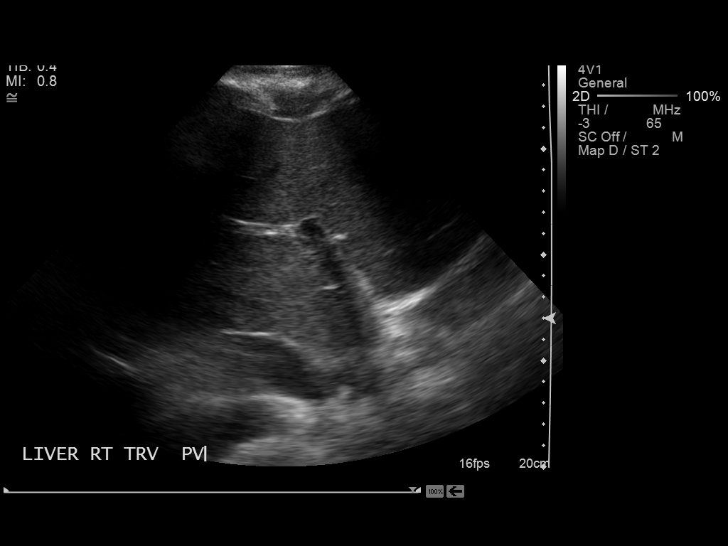
[im 50/119]
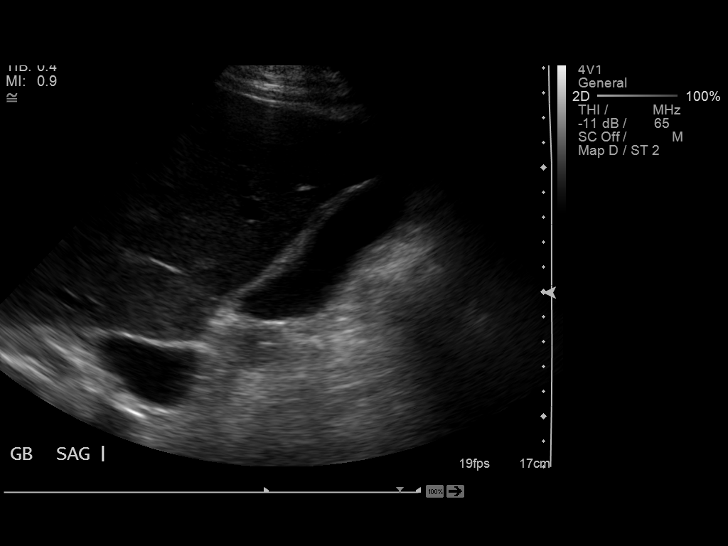
[im 60/119]
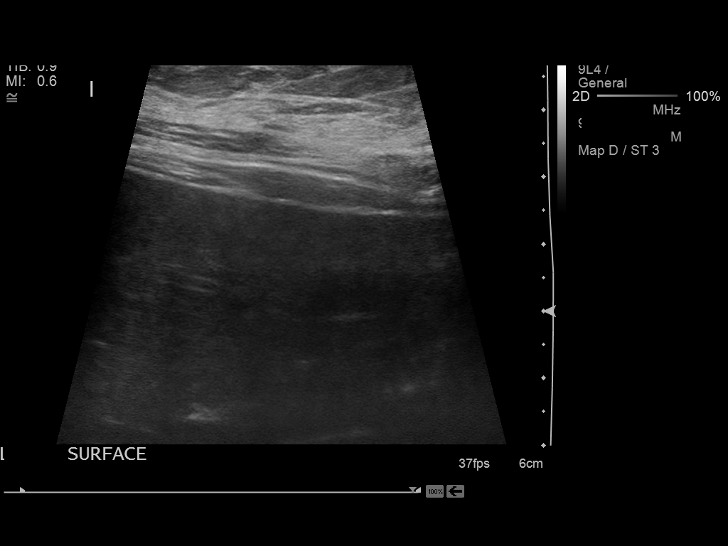
[im 69/119]
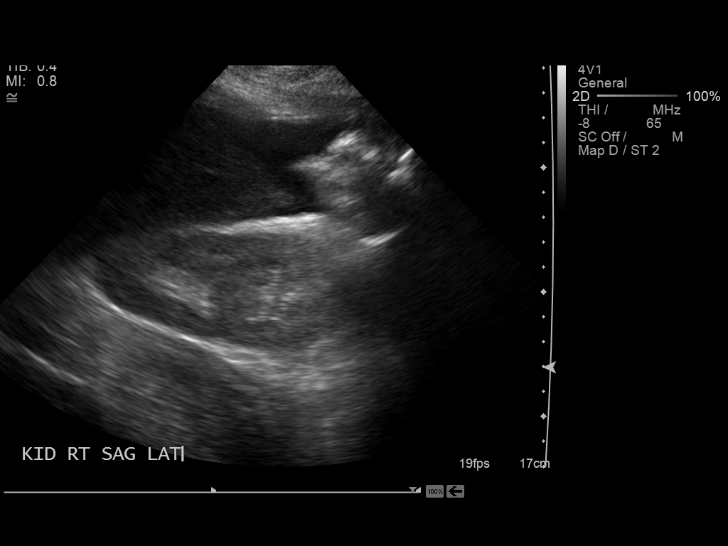
[im 79/119]
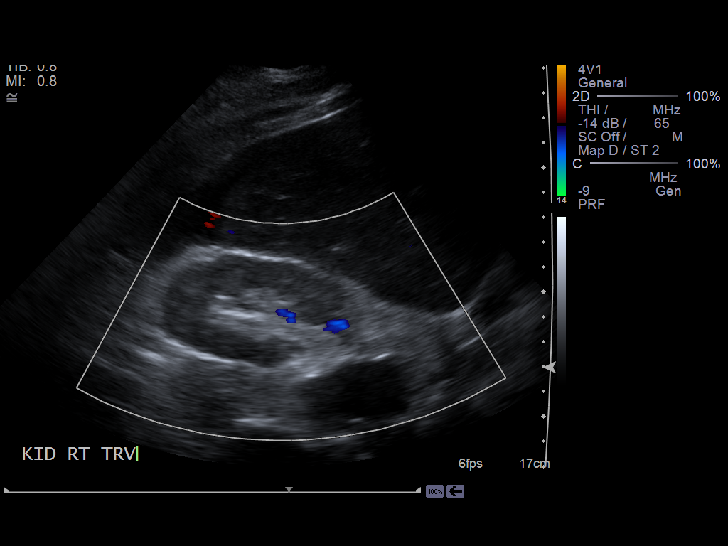
[im 89/119]
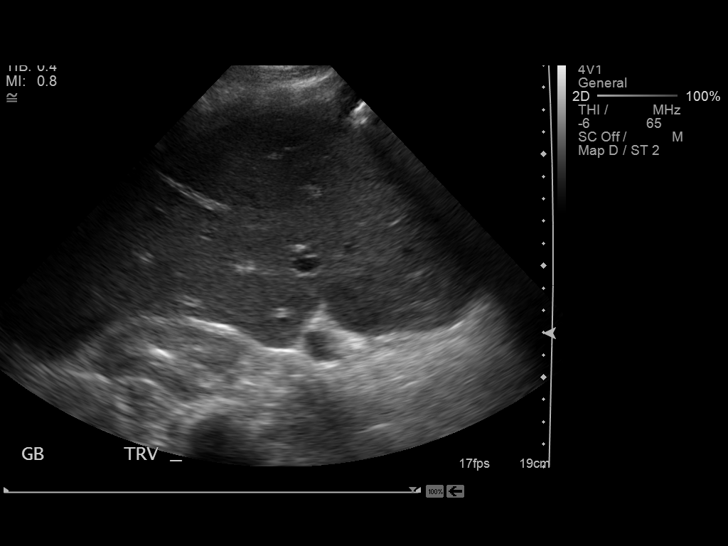
[im 99/119]
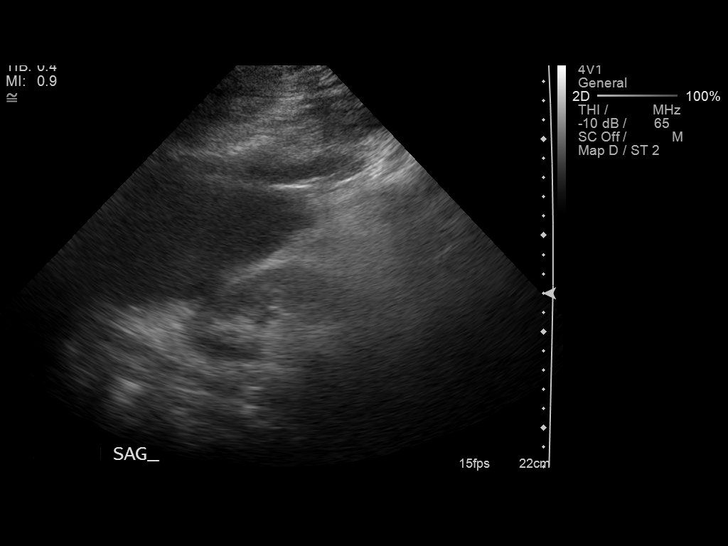
[im 109/119]
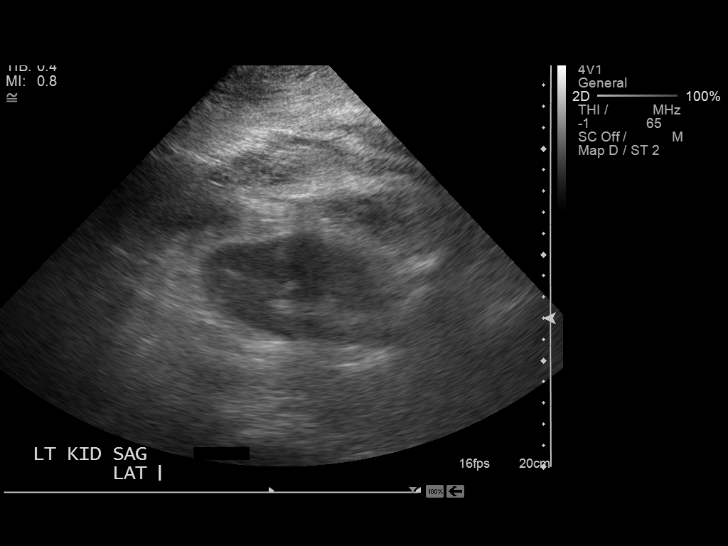
[im 119/119]
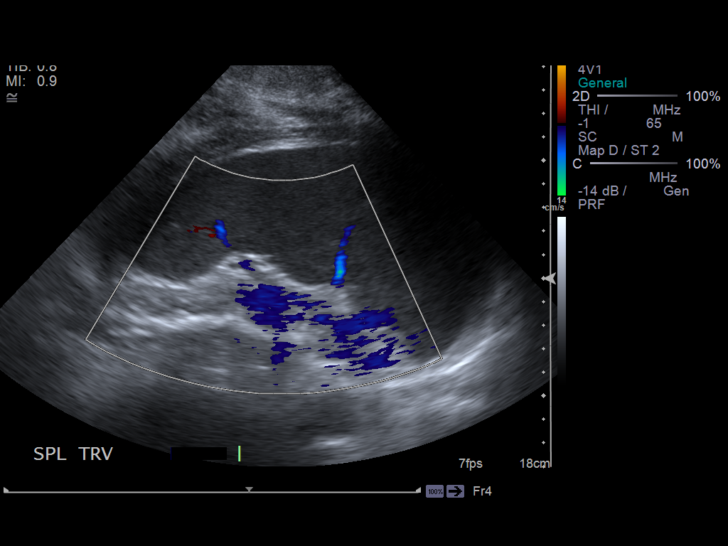

[13 of 25 positions shown; findings below may reference images not displayed]

FINDINGS: Gallbladder:  The gallbladder is distended and shows no
intraluminal stones or sludge.  The gallbladder wall appears
thickened with a width of 6.1 mm and has a tri- layered appearance
suggesting the presence of wall edema.  Evaluation for a
sonographic Murphy's sign is negative.

Common bile duct:  Measures 4.3 mm in diameter and has a normal
appearance

Liver:  Is enlarged with a sagittal length of 24.1 cm in the
midclavicular line no focal parenchymal abnormality is seen and
overall echotexture is slightly diminished relative to the kidney.
No signs of intrahepatic ductal dilatation are noted

IVC:  The proximal portion appears normal

Pancreas:  Is poorly visualized due to shadowing from overlying gas

Spleen:  Has a length of 14.4 cm and overall volume of 802 cc
compatible with splenomegaly sonographically.  The spleen
demonstrates a diffuse decrease in echotexture and this finding has
been described in amyloidosis.  No focal abnormality is seen

Right Kidney:  Demonstrates a sagittal length of 13.1 cm.  A mild
diffuse increase in cortical echotexture is seen with no
parenchymal loss.  This may be related to amyloidosis deposition or
associated with the underlying chronic renal disease.  No focal
abnormalities or signs of hydronephrosis are noted

Left Kidney:  Has a sagittal length of 11.6 cm.  The overall
echotexture is mildly increased with no focal parenchymal
abnormality or hydronephrosis noted

Abdominal aorta:  The distal portion of the aorta is obscured by
overlying gas.  The visualized portion has a maximal caliber of
cm with no aneurysmal dilatation seen

Other:  A small amount of ascites is identified in the upper
abdomen.
IMPRESSION: Hepatomegaly and splenomegaly with mild decrease in echotexture
suspected diffusely for both organs. No focal abnormality is seen
and these findings raise suspicion for organ involvement with
amylodosis.

Slight increase in renal echotexture bilaterally.  This has been
described with amyloidosis deposition but may also be secondary to
the known chronic renal disease.  No focal renal abnormalities are
seen.

Small amount of ascites.  Thickening of the gallbladder wall with a
tri-layered pattern suggesting wall edema.  Given the presence of
ascites and clinical history of anasarca, this is likely due to
third spacing. In the appropriate clinical setting, this can be an
indicator of acute acalculous cholecystitis.

## 2014-09-21 ENCOUNTER — Inpatient Hospital Stay (HOSPITAL_COMMUNITY): Admission: RE | Admit: 2014-09-21 | Payer: Medicare Other | Source: Ambulatory Visit

## 2014-09-29 DIAGNOSIS — E669 Obesity, unspecified: Secondary | ICD-10-CM | POA: Diagnosis not present

## 2014-09-29 DIAGNOSIS — D649 Anemia, unspecified: Secondary | ICD-10-CM | POA: Diagnosis not present

## 2014-09-29 DIAGNOSIS — I110 Hypertensive heart disease with heart failure: Secondary | ICD-10-CM | POA: Diagnosis not present

## 2014-09-29 DIAGNOSIS — I10 Essential (primary) hypertension: Secondary | ICD-10-CM | POA: Diagnosis not present

## 2014-09-29 DIAGNOSIS — E785 Hyperlipidemia, unspecified: Secondary | ICD-10-CM | POA: Diagnosis not present

## 2014-09-30 ENCOUNTER — Inpatient Hospital Stay (HOSPITAL_COMMUNITY): Admission: RE | Admit: 2014-09-30 | Payer: Medicare Other | Source: Ambulatory Visit

## 2014-10-19 ENCOUNTER — Encounter (HOSPITAL_COMMUNITY): Payer: Medicare Other

## 2014-11-12 ENCOUNTER — Other Ambulatory Visit (HOSPITAL_COMMUNITY): Payer: Self-pay | Admitting: Adult Health

## 2014-12-06 ENCOUNTER — Emergency Department (HOSPITAL_COMMUNITY): Payer: Medicare Other

## 2014-12-06 ENCOUNTER — Inpatient Hospital Stay (HOSPITAL_COMMUNITY)
Admission: EM | Admit: 2014-12-06 | Discharge: 2014-12-12 | DRG: 291 | Disposition: A | Discharge status: Home / Self Care | Payer: Medicare Other | Attending: Internal Medicine | Admitting: Internal Medicine

## 2014-12-06 ENCOUNTER — Encounter (HOSPITAL_COMMUNITY): Payer: Self-pay | Admitting: *Deleted

## 2014-12-06 DIAGNOSIS — M109 Gout, unspecified: Secondary | ICD-10-CM | POA: Diagnosis present

## 2014-12-06 DIAGNOSIS — I272 Other secondary pulmonary hypertension: Secondary | ICD-10-CM | POA: Diagnosis not present

## 2014-12-06 DIAGNOSIS — R0602 Shortness of breath: Secondary | ICD-10-CM | POA: Diagnosis not present

## 2014-12-06 DIAGNOSIS — I472 Ventricular tachycardia: Secondary | ICD-10-CM | POA: Diagnosis not present

## 2014-12-06 DIAGNOSIS — I509 Heart failure, unspecified: Secondary | ICD-10-CM

## 2014-12-06 DIAGNOSIS — I482 Chronic atrial fibrillation: Secondary | ICD-10-CM | POA: Diagnosis present

## 2014-12-06 DIAGNOSIS — Z6841 Body Mass Index (BMI) 40.0 and over, adult: Secondary | ICD-10-CM

## 2014-12-06 DIAGNOSIS — I4891 Unspecified atrial fibrillation: Secondary | ICD-10-CM

## 2014-12-06 DIAGNOSIS — I429 Cardiomyopathy, unspecified: Secondary | ICD-10-CM | POA: Diagnosis not present

## 2014-12-06 DIAGNOSIS — I5033 Acute on chronic diastolic (congestive) heart failure: Secondary | ICD-10-CM | POA: Diagnosis present

## 2014-12-06 DIAGNOSIS — I130 Hypertensive heart and chronic kidney disease with heart failure and stage 1 through stage 4 chronic kidney disease, or unspecified chronic kidney disease: Principal | ICD-10-CM | POA: Diagnosis present

## 2014-12-06 DIAGNOSIS — I5032 Chronic diastolic (congestive) heart failure: Secondary | ICD-10-CM | POA: Diagnosis present

## 2014-12-06 DIAGNOSIS — M1000 Idiopathic gout, unspecified site: Secondary | ICD-10-CM | POA: Diagnosis present

## 2014-12-06 DIAGNOSIS — Z79899 Other long term (current) drug therapy: Secondary | ICD-10-CM

## 2014-12-06 DIAGNOSIS — Z9119 Patient's noncompliance with other medical treatment and regimen: Secondary | ICD-10-CM

## 2014-12-06 DIAGNOSIS — I10 Essential (primary) hypertension: Secondary | ICD-10-CM | POA: Diagnosis present

## 2014-12-06 DIAGNOSIS — R0600 Dyspnea, unspecified: Secondary | ICD-10-CM | POA: Diagnosis not present

## 2014-12-06 DIAGNOSIS — Z7901 Long term (current) use of anticoagulants: Secondary | ICD-10-CM

## 2014-12-06 DIAGNOSIS — Z8679 Personal history of other diseases of the circulatory system: Secondary | ICD-10-CM

## 2014-12-06 DIAGNOSIS — N183 Chronic kidney disease, stage 3 (moderate): Secondary | ICD-10-CM | POA: Diagnosis present

## 2014-12-06 DIAGNOSIS — N289 Disorder of kidney and ureter, unspecified: Secondary | ICD-10-CM | POA: Diagnosis not present

## 2014-12-06 HISTORY — DX: Chronic diastolic (congestive) heart failure: I50.32

## 2014-12-06 HISTORY — DX: Pulmonary hypertension, unspecified: I27.20

## 2014-12-06 HISTORY — DX: Other persistent atrial fibrillation: I48.19

## 2014-12-06 LAB — COMPREHENSIVE METABOLIC PANEL
ALT: 11 U/L — ABNORMAL LOW (ref 14–54)
AST: 20 U/L (ref 15–41)
Albumin: 3.9 g/dL (ref 3.5–5.0)
Alkaline Phosphatase: 115 U/L (ref 38–126)
Anion gap: 10 (ref 5–15)
BUN: 28 mg/dL — AB (ref 6–20)
CALCIUM: 8 mg/dL — AB (ref 8.9–10.3)
CHLORIDE: 105 mmol/L (ref 101–111)
CO2: 22 mmol/L (ref 22–32)
CREATININE: 1.64 mg/dL — AB (ref 0.44–1.00)
GFR, EST AFRICAN AMERICAN: 44 mL/min — AB (ref >60)
GFR, EST NON AFRICAN AMERICAN: 38 mL/min — AB (ref >60)
Glucose, Bld: 120 mg/dL — ABNORMAL HIGH (ref 65–99)
Potassium: 4.2 mmol/L (ref 3.5–5.1)
Sodium: 137 mmol/L (ref 135–145)
TOTAL PROTEIN: 8.6 g/dL — AB (ref 6.5–8.1)
Total Bilirubin: 1.1 mg/dL (ref 0.3–1.2)

## 2014-12-06 LAB — CBC WITH DIFFERENTIAL/PLATELET
BASOS ABS: 0 10*3/uL (ref 0.0–0.1)
BASOS PCT: 0 %
EOS ABS: 0.1 10*3/uL (ref 0.0–0.7)
EOS PCT: 2 %
HEMATOCRIT: 35.7 % — AB (ref 36.0–46.0)
Hemoglobin: 11.1 g/dL — ABNORMAL LOW (ref 12.0–15.0)
Lymphocytes Relative: 14 %
Lymphs Abs: 1.1 10*3/uL (ref 0.7–4.0)
MCH: 27.5 pg (ref 26.0–34.0)
MCHC: 31.1 g/dL (ref 30.0–36.0)
MCV: 88.4 fL (ref 78.0–100.0)
MONOS PCT: 7 %
Monocytes Absolute: 0.6 10*3/uL (ref 0.1–1.0)
Neutro Abs: 6.3 10*3/uL (ref 1.7–7.7)
Neutrophils Relative %: 77 %
PLATELETS: 207 10*3/uL (ref 150–400)
RBC: 4.04 MIL/uL (ref 3.87–5.11)
RDW: 16.4 % — AB (ref 11.5–15.5)
WBC: 8.2 10*3/uL (ref 4.0–10.5)

## 2014-12-06 LAB — BRAIN NATRIURETIC PEPTIDE: B Natriuretic Peptide: 220 pg/mL — ABNORMAL HIGH (ref 0.0–100.0)

## 2014-12-06 LAB — TROPONIN I: TROPONIN I: 0.04 ng/mL — AB (ref <0.031)

## 2014-12-06 MED ORDER — FUROSEMIDE 10 MG/ML IJ SOLN
40.0000 mg | Freq: Once | INTRAMUSCULAR | Status: AC
  Administered 2014-12-06: 40 mg via INTRAVENOUS
  Filled 2014-12-06: qty 4

## 2014-12-06 MED ORDER — ALBUTEROL SULFATE (2.5 MG/3ML) 0.083% IN NEBU
5.0000 mg | INHALATION_SOLUTION | Freq: Once | RESPIRATORY_TRACT | Status: AC
  Administered 2014-12-06: 5 mg via RESPIRATORY_TRACT
  Filled 2014-12-06: qty 6

## 2014-12-06 MED ORDER — LORAZEPAM 2 MG/ML IJ SOLN
INTRAMUSCULAR | Status: AC
  Filled 2014-12-06: qty 1

## 2014-12-06 MED ORDER — DEXTROSE 5 % IV SOLN
5.0000 mg/h | INTRAVENOUS | Status: DC
  Administered 2014-12-06: 5 mg/h via INTRAVENOUS
  Administered 2014-12-07: 10 mg/h via INTRAVENOUS
  Filled 2014-12-06 (×2): qty 100

## 2014-12-06 MED ORDER — DILTIAZEM LOAD VIA INFUSION
10.0000 mg | Freq: Once | INTRAVENOUS | Status: AC
  Administered 2014-12-06: 10 mg via INTRAVENOUS
  Filled 2014-12-06: qty 10

## 2014-12-06 MED ORDER — HYDRALAZINE HCL 20 MG/ML IJ SOLN
10.0000 mg | INTRAMUSCULAR | Status: DC | PRN
  Administered 2014-12-07: 10 mg via INTRAVENOUS
  Filled 2014-12-06: qty 1

## 2014-12-06 MED ORDER — LORAZEPAM 2 MG/ML IJ SOLN
1.0000 mg | Freq: Once | INTRAMUSCULAR | Status: AC
  Administered 2014-12-06: 1 mg via INTRAVENOUS

## 2014-12-06 NOTE — ED Notes (Signed)
MD and Family at the bedside, RT completed breathing treatment

## 2014-12-06 NOTE — ED Notes (Signed)
Up to bedside commode, pt voided,  pt SOB, sat dropped to 88%, placed on 2 L

## 2014-12-06 NOTE — ED Provider Notes (Signed)
CSN: [id number]     Arrival date & time 12/06/14  2131 History   First MD Initiated Contact with Patient 12/06/14 2218     Chief Complaint  Patient presents with  . Shortness of Breath     (Consider location/radiation/quality/duration/timing/severity/associated sxs/prior Treatment) HPI Comments: Patient is a 44 year old female with history of cardiomyopathy, atrial fibrillation, and CHF. She presents for evaluation of difficulty breathing that has worsened over the past several days. She reports increased lower extremity edema. She denies any chest pain. She denies any fevers, chills, or cough. She has been admitted in the past for exacerbations of CHF requiring diuresis. She is followed by Dr. Sung Amabile in the cardiology clinic.  Patient is a 44 y.o. female presenting with shortness of breath. The history is provided by the patient.  Shortness of Breath Severity:  Moderate Onset quality:  Sudden Duration:  2 days Timing:  Constant Progression:  Worsening Chronicity:  New Relieved by:  Nothing Worsened by:  Nothing tried Ineffective treatments:  None tried Associated symptoms: no chest pain, no fever and no hemoptysis     Past Medical History  Diagnosis Date  . Essential hypertension, benign     History of accelerated hypertension with urgency  . Atrial flutter ([place])     Documented 2006 - [place]  . Gout   . History of cardiac catheterization     Ectatic coronaries without obstruction 2006 - [facility]  . Morbid obesity ([place])   . Hypertensive heart disease     LVEF reportedly 50-55% in 2006 - [place]  . CKD (chronic kidney disease) stage 3, GFR 30-59 ml/min   . Anemia    Past Surgical History  Procedure Laterality Date  . Skin biopsy  12/18/2011    Procedure: BIOPSY SKIN;  Surgeon: Donato Heinz, MD;  Location: [facility];  Service: General;  Laterality: N/A;  in the minor room.  Darden Dates without cardioversion  01/15/2012    Procedure: TRANSESOPHAGEAL ECHOCARDIOGRAM (TEE);  Surgeon:  Jolaine Artist, MD;  Location: [facility] ENDOSCOPY;  Service: Cardiovascular;  Laterality: N/A;  cardioversion/on xarelto  . Cardioversion  01/15/2012    Procedure: CARDIOVERSION;  Surgeon: Jolaine Artist, MD;  Location: [facility] ENDOSCOPY;  Service: Cardiovascular;  Laterality: N/A;  . Right heart catheterization N/A 12/26/2011    Procedure: RIGHT HEART CATH;  Surgeon: Jolaine Artist, MD;  Location[facility] C[facility] LAB;  Service: Cardiovascular;  Laterality: N/A;   Family History  Problem Relation Age of Onset  . Hypertension    . Hypertension Mother   . Diabetes Mother    Social History  Substance Use Topics  . Smoking status: Never Smoker   . Smokeless tobacco: Never Used  . Alcohol Use: No   OB History    Gravida Para Term Preterm AB TAB SAB Ectopic Multiple Living            0     Review of Systems  Constitutional: Negative for fever.  Respiratory: Positive for shortness of breath. Negative for hemoptysis.   Cardiovascular: Negative for chest pain.  All other systems reviewed and are negative.     Allergies  Penicillins  Home Medications   Prior to Admission medications   Medication Sig Start Date End Date Taking? Authorizing Provider  acetaminophen (TYLENOL) 325 MG tablet Take 2 tablets (650 mg total) by mouth every 4 (four) hours as needed. 01/04/12   Theodis Blaze, MD  allopurinol (ZYLOPRIM) 100 MG tablet Take 200 mg by mouth daily. 01/04/12  Theodis Blaze, MD  amLODipine (NORVASC) 10 MG tablet Take 1 tablet (10 mg total) by mouth daily. 12/20/11   Nimish Luther Parody, MD  apixaban (ELIQUIS) 5 MG TABS tablet Take 5 mg by mouth 2 (two) times daily.    Historical Provider, MD  carvedilol (COREG) 3.125 MG tablet Take 3.125 mg by mouth 2 (two) times daily with a meal.    Historical Provider, MD  cloNIDine (CATAPRES) 0.2 MG tablet Take 0.2 mg by mouth 3 (three) times daily.    Historical Provider, MD  folic acid (FOLVITE) 1 MG tablet Take 1 tablet (1 mg total) by mouth daily.  12/20/11   Nimish Luther Parody, MD  hydrALAZINE (APRESOLINE) 100 MG tablet Take 1 tablet (100 mg total) by mouth 3 (three) times daily. MUST HAVE FOLLOW UP APPOINTMENT FOR FURTHER REFILLS 03/08/14   Jolaine Artist, MD  metolazone (ZAROXOLYN) 2.5 MG tablet Take 1 tablet (2.5 mg total) by mouth as needed. 05/15/12   Amy D Ninfa Meeker, NP  oxyCODONE-acetaminophen (PERCOCET) 5-325 MG per tablet Take 2 tablets by mouth every 4 (four) hours as needed. 08/09/13   Veryl Speak, MD  pantoprazole (PROTONIX) 40 MG tablet Take 1 tablet (40 mg total) by mouth daily. 12/20/11   Nimish Luther Parody, MD  potassium chloride SA (K-DUR,KLOR-CON) 20 MEQ tablet Take 20 mEq by mouth daily as needed.    Historical Provider, MD  predniSONE (DELTASONE) 10 MG tablet Take 2 tablets (20 mg total) by mouth 2 (two) times daily. 08/09/13   Veryl Speak, MD  torsemide (DEMADEX) 20 MG tablet TAKE 2 TABLETS BY MOUTH EVERY OTHER DAY 11/12/14   Shaune Pascal Bensimhon, MD   BP 184/120 mmHg  Pulse 102  Temp(Src) 98.2 F (36.8 C) (Oral)  Resp 22  Ht 5\' 10"  (1.778 m)  Wt 260 lb (117.935 kg)  BMI 37.31 kg/m2  SpO2 99%  LMP 11/29/2014 Physical Exam  Constitutional: She is oriented to person, place, and time. She appears well-developed and well-nourished. No distress.  HENT:  Head: Normocephalic and atraumatic.  Neck: Normal range of motion. Neck supple.  Cardiovascular:  Heart is irregularly irregular and rapid.  Pulmonary/Chest: Effort normal. No respiratory distress. She has no wheezes. She has rales. She exhibits no tenderness.  There are slight rales in the bases bilaterally.  Abdominal: Soft. Bowel sounds are normal. She exhibits no distension. There is no tenderness.  Musculoskeletal: Normal range of motion. She exhibits edema.  There is 3-4+ pitting edema of the bilateral lower extremities.  Neurological: She is alert and oriented to person, place, and time.  Skin: Skin is warm and dry. She is not diaphoretic.  Nursing note and vitals  reviewed.   ED Course  Procedures (including critical care time) Labs Review Labs Reviewed  URINALYSIS, ROUTINE W REFLEX MICROSCOPIC (NOT AT [facility])  PREGNANCY, URINE  COMPREHENSIVE METABOLIC PANEL  BRAIN NATRIURETIC PEPTIDE  TROPONIN I  CBC WITH DIFFERENTIAL/PLATELET    Imaging Review Dg Chest Port 1 View  12/06/2014   CLINICAL DATA:  44 year old female shortness of breath and lower extremity edema. Initial encounter.  EXAM: PORTABLE CHEST 1 VIEW  COMPARISON:  Portable chest 12/24/2011 and earlier.  FINDINGS: Portable AP upright view at 2153 hours. Large body habitus. There is cardiomegaly. Other mediastinal contours are within normal limits. Pulmonary vascular congestion appears stable to that in 2013. Increased retrocardiac opacity. No pneumothorax or definite effusion. No other confluent pulmonary opacity.  IMPRESSION: Cardiomegaly with pulmonary vascular congestion, no overt edema. No definite  effusion. Retrocardiac opacity which could reflect atelectasis or consolidation.   Electronically Signed   By: Genevie Ann M.D.   On: 12/06/2014 22:24   I have personally reviewed and evaluated these images and lab results as part of my medical decision-making.   EKG Interpretation   Date/Time:  Monday December 06 2014 21:50:06 EDT Ventricular Rate:  110 PR Interval:    QRS Duration: 114 QT Interval:  365 QTC Calculation: 494 R Axis:   17 Text Interpretation:  Atrial fibrillation Borderline intraventricular  conduction delay Low voltage, precordial leads Nonspecific T  abnormalities, lateral leads Confirmed by Jasamine Pottinger  MD, Gracelin Weisberg ([id number]) on  12/06/2014 10:30:49 PM      MDM   Final diagnoses:  None    Patient presents with difficulty breathing, increased leg swelling, and palpitations. She has an atrial fib with RVR and what appears to be congestive heart failure. She was started on a Cardizem drip and given Lasix. Her workup reveals a mildly elevated BNP. She will be admitted to the  hospitalist service under the care of Dr. Marin Comment.  CRITICAL CARE Performed by: Veryl Speak Total critical care time: 30 minutes Critical care time was exclusive of separately billable procedures and treating other patients. Critical care was necessary to treat or prevent imminent or life-threatening deterioration. Critical care was time spent personally by me on the following activities: development of treatment plan with patient and/or surrogate as well as nursing, discussions with consultants, evaluation of patient's response to treatment, examination of patient, obtaining history from patient or surrogate, ordering and performing treatments and interventions, ordering and review of laboratory studies, ordering and review of radiographic studies, pulse oximetry and re-evaluation of patient's condition.     Veryl Speak, MD 12/06/14 [phone]

## 2014-12-06 NOTE — ED Notes (Signed)
Pt brought in by [facility] for c/o sob and has had edema to bilateral lower legs x 2 days ago

## 2014-12-07 ENCOUNTER — Inpatient Hospital Stay (HOSPITAL_COMMUNITY): Payer: Medicare Other

## 2014-12-07 ENCOUNTER — Encounter (HOSPITAL_COMMUNITY): Payer: Self-pay | Admitting: Cardiology

## 2014-12-07 DIAGNOSIS — N183 Chronic kidney disease, stage 3 (moderate): Secondary | ICD-10-CM | POA: Diagnosis present

## 2014-12-07 DIAGNOSIS — I10 Essential (primary) hypertension: Secondary | ICD-10-CM | POA: Diagnosis not present

## 2014-12-07 DIAGNOSIS — I272 Other secondary pulmonary hypertension: Secondary | ICD-10-CM

## 2014-12-07 DIAGNOSIS — I130 Hypertensive heart and chronic kidney disease with heart failure and stage 1 through stage 4 chronic kidney disease, or unspecified chronic kidney disease: Secondary | ICD-10-CM | POA: Diagnosis not present

## 2014-12-07 DIAGNOSIS — I5033 Acute on chronic diastolic (congestive) heart failure: Secondary | ICD-10-CM

## 2014-12-07 DIAGNOSIS — Z7901 Long term (current) use of anticoagulants: Secondary | ICD-10-CM | POA: Diagnosis not present

## 2014-12-07 DIAGNOSIS — Z9119 Patient's noncompliance with other medical treatment and regimen: Secondary | ICD-10-CM | POA: Diagnosis not present

## 2014-12-07 DIAGNOSIS — M1000 Idiopathic gout, unspecified site: Secondary | ICD-10-CM | POA: Diagnosis present

## 2014-12-07 DIAGNOSIS — I509 Heart failure, unspecified: Secondary | ICD-10-CM | POA: Diagnosis not present

## 2014-12-07 DIAGNOSIS — Z452 Encounter for adjustment and management of vascular access device: Secondary | ICD-10-CM | POA: Diagnosis not present

## 2014-12-07 DIAGNOSIS — Z6841 Body Mass Index (BMI) 40.0 and over, adult: Secondary | ICD-10-CM | POA: Diagnosis not present

## 2014-12-07 DIAGNOSIS — I482 Chronic atrial fibrillation: Secondary | ICD-10-CM | POA: Diagnosis present

## 2014-12-07 DIAGNOSIS — I4891 Unspecified atrial fibrillation: Secondary | ICD-10-CM

## 2014-12-07 DIAGNOSIS — I429 Cardiomyopathy, unspecified: Secondary | ICD-10-CM | POA: Diagnosis not present

## 2014-12-07 DIAGNOSIS — Z79899 Other long term (current) drug therapy: Secondary | ICD-10-CM | POA: Diagnosis not present

## 2014-12-07 DIAGNOSIS — R0602 Shortness of breath: Secondary | ICD-10-CM | POA: Diagnosis not present

## 2014-12-07 DIAGNOSIS — I472 Ventricular tachycardia: Secondary | ICD-10-CM | POA: Diagnosis not present

## 2014-12-07 DIAGNOSIS — N289 Disorder of kidney and ureter, unspecified: Secondary | ICD-10-CM | POA: Diagnosis not present

## 2014-12-07 LAB — BASIC METABOLIC PANEL
Anion gap: 6 (ref 5–15)
BUN: 28 mg/dL — AB (ref 6–20)
CALCIUM: 7.9 mg/dL — AB (ref 8.9–10.3)
CO2: 25 mmol/L (ref 22–32)
CREATININE: 1.71 mg/dL — AB (ref 0.44–1.00)
Chloride: 110 mmol/L (ref 101–111)
GFR calc Af Amer: 41 mL/min — ABNORMAL LOW (ref >60)
GFR, EST NON AFRICAN AMERICAN: 36 mL/min — AB (ref >60)
Glucose, Bld: 92 mg/dL (ref 65–99)
POTASSIUM: 3.4 mmol/L — AB (ref 3.5–5.1)
SODIUM: 141 mmol/L (ref 135–145)

## 2014-12-07 LAB — TSH: TSH: 2.766 u[IU]/mL (ref 0.350–4.500)

## 2014-12-07 LAB — URINALYSIS, ROUTINE W REFLEX MICROSCOPIC
BILIRUBIN URINE: NEGATIVE
Glucose, UA: NEGATIVE mg/dL
Hgb urine dipstick: NEGATIVE
Ketones, ur: NEGATIVE mg/dL
Leukocytes, UA: NEGATIVE
NITRITE: NEGATIVE
PH: 6 (ref 5.0–8.0)
Protein, ur: NEGATIVE mg/dL
SPECIFIC GRAVITY, URINE: 1.01 (ref 1.005–1.030)
Urobilinogen, UA: 0.2 mg/dL (ref 0.0–1.0)

## 2014-12-07 LAB — PREGNANCY, URINE: PREG TEST UR: NEGATIVE

## 2014-12-07 LAB — MRSA PCR SCREENING: MRSA BY PCR: NEGATIVE

## 2014-12-07 MED ORDER — FUROSEMIDE 10 MG/ML IJ SOLN
40.0000 mg | Freq: Two times a day (BID) | INTRAMUSCULAR | Status: DC
  Administered 2014-12-07: 40 mg via INTRAVENOUS
  Filled 2014-12-07: qty 4

## 2014-12-07 MED ORDER — POTASSIUM CHLORIDE CRYS ER 20 MEQ PO TBCR
40.0000 meq | EXTENDED_RELEASE_TABLET | Freq: Once | ORAL | Status: AC
  Administered 2014-12-07: 40 meq via ORAL

## 2014-12-07 MED ORDER — ONDANSETRON HCL 4 MG PO TABS: 4.0000 mg | ORAL_TABLET | Freq: Four times a day (QID) | ORAL | Status: DC | PRN

## 2014-12-07 MED ORDER — CLONIDINE HCL 0.1 MG PO TABS
0.1000 mg | ORAL_TABLET | Freq: Three times a day (TID) | ORAL | Status: DC
  Administered 2014-12-07 – 2014-12-12 (×16): 0.1 mg via ORAL
  Filled 2014-12-07 (×17): qty 1

## 2014-12-07 MED ORDER — PANTOPRAZOLE SODIUM 40 MG PO TBEC
40.0000 mg | DELAYED_RELEASE_TABLET | Freq: Every day | ORAL | Status: DC
  Administered 2014-12-07 – 2014-12-12 (×6): 40 mg via ORAL
  Filled 2014-12-07 (×6): qty 1

## 2014-12-07 MED ORDER — CETYLPYRIDINIUM CHLORIDE 0.05 % MT LIQD
7.0000 mL | Freq: Two times a day (BID) | OROMUCOSAL | Status: DC
  Administered 2014-12-07 – 2014-12-12 (×5): 7 mL via OROMUCOSAL

## 2014-12-07 MED ORDER — AMLODIPINE BESYLATE 5 MG PO TABS: 10.0000 mg | ORAL_TABLET | Freq: Every day | ORAL | Status: DC

## 2014-12-07 MED ORDER — POTASSIUM CHLORIDE CRYS ER 20 MEQ PO TBCR
20.0000 meq | EXTENDED_RELEASE_TABLET | Freq: Every day | ORAL | Status: DC
  Administered 2014-12-07 – 2014-12-08 (×2): 20 meq via ORAL
  Filled 2014-12-07 (×3): qty 1

## 2014-12-07 MED ORDER — CARVEDILOL 3.125 MG PO TABS
3.1250 mg | ORAL_TABLET | Freq: Two times a day (BID) | ORAL | Status: DC
  Administered 2014-12-07 – 2014-12-12 (×10): 3.125 mg via ORAL
  Filled 2014-12-07 (×10): qty 1

## 2014-12-07 MED ORDER — SODIUM CHLORIDE 0.9 % IJ SOLN
3.0000 mL | Freq: Two times a day (BID) | INTRAMUSCULAR | Status: DC
  Administered 2014-12-07 – 2014-12-11 (×8): 3 mL via INTRAVENOUS

## 2014-12-07 MED ORDER — CARVEDILOL 3.125 MG PO TABS
3.1250 mg | ORAL_TABLET | Freq: Two times a day (BID) | ORAL | Status: DC
  Administered 2014-12-07: 3.125 mg via ORAL
  Filled 2014-12-07: qty 1

## 2014-12-07 MED ORDER — APIXABAN 5 MG PO TABS
5.0000 mg | ORAL_TABLET | Freq: Two times a day (BID) | ORAL | Status: DC
  Administered 2014-12-07 – 2014-12-12 (×12): 5 mg via ORAL
  Filled 2014-12-07 (×12): qty 1

## 2014-12-07 MED ORDER — HYDRALAZINE HCL 25 MG PO TABS
100.0000 mg | ORAL_TABLET | Freq: Three times a day (TID) | ORAL | Status: DC
  Administered 2014-12-07 – 2014-12-12 (×16): 100 mg via ORAL
  Filled 2014-12-07 (×7): qty 4
  Filled 2014-12-07: qty 2
  Filled 2014-12-07 (×2): qty 4
  Filled 2014-12-07: qty 2
  Filled 2014-12-07 (×5): qty 4
  Filled 2014-12-07: qty 2
  Filled 2014-12-07 (×2): qty 4
  Filled 2014-12-07: qty 2

## 2014-12-07 MED ORDER — INFLUENZA VAC SPLIT QUAD 0.5 ML IM SUSY
0.5000 mL | PREFILLED_SYRINGE | INTRAMUSCULAR | Status: AC
  Administered 2014-12-08: 0.5 mL via INTRAMUSCULAR
  Filled 2014-12-07: qty 0.5

## 2014-12-07 MED ORDER — FUROSEMIDE 10 MG/ML IJ SOLN
5.0000 mg/h | INTRAMUSCULAR | Status: DC
  Administered 2014-12-07: 5 mg/h via INTRAVENOUS
  Administered 2014-12-08: 10 mg/h via INTRAVENOUS
  Administered 2014-12-09: 5 mg/h via INTRAVENOUS
  Filled 2014-12-07 (×4): qty 25

## 2014-12-07 MED ORDER — ONDANSETRON HCL 4 MG/2ML IJ SOLN: 4.0000 mg | Freq: Four times a day (QID) | INTRAMUSCULAR | Status: DC | PRN

## 2014-12-07 MED ORDER — FOLIC ACID 1 MG PO TABS
1.0000 mg | ORAL_TABLET | Freq: Every day | ORAL | Status: DC
  Administered 2014-12-07 – 2014-12-12 (×6): 1 mg via ORAL
  Filled 2014-12-07 (×6): qty 1

## 2014-12-07 MED ORDER — ALLOPURINOL 100 MG PO TABS
100.0000 mg | ORAL_TABLET | Freq: Every day | ORAL | Status: DC
  Administered 2014-12-07 – 2014-12-12 (×6): 100 mg via ORAL
  Filled 2014-12-07 (×6): qty 1

## 2014-12-07 NOTE — Progress Notes (Signed)
Patient seen and examined. Admitted earlier today for SOB. She has a h/o diastolic CHF. Exhibits sign of marked volume overload. She has already been evaluated by cardiology with plans to transfer to Cardiology service at [facility] for aggressive diuresis. Will sign off for now.  Domingo Mend, MD [facility] Pager: [phone]

## 2014-12-07 NOTE — Consult Note (Signed)
Primary cardiologist: Dr. Pierre Bali Consulting cardiologist: Dr. Satira Sark  Reason for consultation: Decompensated diastolic heart failure, atrial fibrillation  Clinical Summary [name] is a 44 y.o.female admitted to the hospital complaining of shortness of breath associated with weight gain over the last "few days." She reports compliance with her medications, but does not weigh herself regularly with a scale. She has had no recent cardiology follow-up.  She was last seen by Dr. Haroldine Laws in the CHF clinic back in 2014, has not had regular visits since that time. When last evaluated her weight was 240 pounds, she was also noted to be in persistent atrial fibrillation. Current weight (rechecked) is 342 pounds. In reviewing her chart, she had similar presentation back in 2013 at which time she underwent evaluation for diastolic heart failure, amyloidosis was excluded based on fat pad and bone marrow biopsies, negative SPEP and UPEP. She had severe pulmonary hypertension documented that time by right heart catheterization, and ultimately diuresed approximately 100 pounds while hospitalized at [facility].  She has been admitted to the hospitalist service, now on intravenous diltiazem with better control of presumed chronic atrial fibrillation, placed on divided dose Lasix, and is diuresed about 1300 cc. She has what looks to be stable renal dysfunction with creatinine 1.7, BNP 220, and chest x-ray showing cardiomegaly with pulmonary vascular congestion but no definite effusions. Follow-up echocardiogram is pending.   Allergies: Penicillin  Medications Scheduled Medications: . allopurinol  100 mg Oral Daily  . antiseptic oral rinse  7 mL Mouth Rinse BID  . apixaban  5 mg Oral BID  . cloNIDine  0.1 mg Oral TID  . folic acid  1 mg Oral Daily  . hydrALAZINE  100 mg Oral TID  . pantoprazole  40 mg Oral Daily  . potassium chloride SA  20 mEq Oral Daily  . sodium chloride  3 mL  Intravenous Q12H    Infusions: . furosemide (LASIX) infusion      PRN Medications: hydrALAZINE, ondansetron **OR** ondansetron (ZOFRAN) IV   Past Medical History  Diagnosis Date  . Essential hypertension, benign     History of accelerated hypertension with urgency  . Atrial flutter ([place])     Documented 2006 - [place]  . Gout   . History of cardiac catheterization     Ectatic coronaries without obstruction 2006 - [facility]  . Morbid obesity ([place])   . Hypertensive heart disease     LVEF reportedly 50-55% in 2006 - [place]  . CKD (chronic kidney disease) stage 3, GFR 30-59 ml/min   . Anemia   . Persistent atrial fibrillation ([place])   . Chronic diastolic heart failure ([place])     Amyloidosis work-up negative 2013  . Pulmonary hypertension ([facility])     PASP 70-80 mmHg 2013    Past Surgical History  Procedure Laterality Date  . Skin biopsy  12/18/2011    Procedure: BIOPSY SKIN;  Surgeon: Donato Heinz, MD;  Location: [facility];  Service: General;  Laterality: N/A;  in the minor room.  Darden Dates without cardioversion  01/15/2012    Procedure: TRANSESOPHAGEAL ECHOCARDIOGRAM (TEE);  Surgeon: Jolaine Artist, MD;  Location: [facility] ENDOSCOPY;  Service: Cardiovascular;  Laterality: N/A;  cardioversion/on xarelto  . Cardioversion  01/15/2012    Procedure: CARDIOVERSION;  Surgeon: Jolaine Artist, MD;  Location: [facility] ENDOSCOPY;  Service: Cardiovascular;  Laterality: N/A;  . Right heart catheterization N/A 12/26/2011    Procedure: RIGHT HEART CATH;  Surgeon: Shaune Pascal Bensimhon,  MD;  Location: [place] CATH LAB;  Service: Cardiovascular;  Laterality: N/A;    Family History  Problem Relation Age of Onset  . Hypertension Mother   . Diabetes Mother     Social History Ms. Cerny reports that she has never smoked. She has never used smokeless tobacco. Ms. Babin reports that she does not drink alcohol.  Review of Systems Complete review of systems negative except as otherwise outlined in the clinical summary and  also the following. No fevers or chills. No cough. No chest pain. Mild sense of palpitations. No syncope. No hemoptysis.  Physical Examination Blood pressure 180/109, pulse 77, temperature 98.3 F (36.8 C), temperature source Oral, resp. rate 20, height 5\' 11"  (1.803 m), weight 344 lb 9.3 oz (156.3 kg), last menstrual period 11/29/2014, SpO2 97 %.  Intake/Output Summary (Last 24 hours) at 12/07/14 0854 Last data filed at 12/07/14 0600  Gross per 24 hour  Intake 193.17 ml  Output   1550 ml  Net -1356.83 ml   Telemetry: Rate-controlled atrial fibrillation.  Gen.: Morbidly obese woman, no acute distress. HEENT: Conjunctiva and lids normal, oropharynx clear. Neck: Supple, increased girth, elevated JVP, no carotid bruits, no thyromegaly. Lungs: Coarse breath sounds with expiratory rhonchi, nonlabored breathing at rest. Cardiac: Irregularly irregular, no S3, soft apical systolic murmur, no pericardial rub. Abdomen: Obese, protuberant, bowel sounds present, no guarding or rebound. Extremities: Marked, chronic appearing, woody edema up to the thighs and sacrum, distal pulses 1-2+. Skin: Warm and dry. Musculoskeletal: No kyphosis. Neuropsychiatric: Alert and oriented x3, affect grossly appropriate.   Lab Results  Basic Metabolic Panel:  Recent Labs Lab 12/06/14 2218 12/07/14 0416  NA 137 141  K 4.2 3.4*  CL 105 110  CO2 22 25  GLUCOSE 120* 92  BUN 28* 28*  CREATININE 1.64* 1.71*  CALCIUM 8.0* 7.9*    Liver Function Tests:  Recent Labs Lab 12/06/14 2218  AST 20  ALT 11*  ALKPHOS 115  BILITOT 1.1  PROT 8.6*  ALBUMIN 3.9    CBC:  Recent Labs Lab 12/06/14 2218  WBC 8.2  NEUTROABS 6.3  HGB 11.1*  HCT 35.7*  MCV 88.4  PLT 207    Cardiac Enzymes:  Recent Labs Lab 12/06/14 2218  TROPONINI 0.04*    BNP: 220  ECG Atrial fibrillation with IVCD and nonspecific ST-T changes.  Imaging Chest x-ray 12/06/2014: FINDINGS: Portable AP upright view at 2153  hours. Large body habitus. There is cardiomegaly. Other mediastinal contours are within normal limits. Pulmonary vascular congestion appears stable to that in 2013. Increased retrocardiac opacity. No pneumothorax or definite effusion. No other confluent pulmonary opacity.  IMPRESSION: Cardiomegaly with pulmonary vascular congestion, no overt edema. No definite effusion. Retrocardiac opacity which could reflect atelectasis or consolidation.  Echocardiogram 12/28/2011: Study Conclusions  - Left ventricle: Wall thickness was increased in a pattern of moderate LVH. There was focal basal hypertrophy. Systolic function was normal. The estimated ejection fraction was in the range of 60% to 65%. - Ventricular septum: The contour showed diastolic flattening and systolic flattening. - Left atrium: The appendage was severely dilated. - Right ventricle: The cavity size was moderately dilated. Systolic function was moderately reduced. - Right atrium: The atrium was massively dilated. - Atrial septum: There was a patent foramen ovale. - Tricuspid valve: Moderate regurgitation. - Pulmonary arteries: Systolic pressure was severely increased. PA peak pressure: 81mm Hg (S). - Pericardium, extracardiac: A small to moderate pericardial effusion was identified. There was no evidence of hemodynamic compromise.  Impression  1. Marked fluid overload with suspected acute on chronic diastolic heart failure complicated by pulmonary hypertension and chronic atrial fibrillation. Patient has been noncompliant with regular follow-up, but states that she has been taking her medications regularly. At this point she looks to be 100 pounds over her prior stable weight based on chart review and it is most likely that her volume overload has been a slowly building process for some time now. Last echocardiogram in 2013 reported LVEF 60-65% with moderate RV dysfunction, biatrial enlargement, and PASP 87  mmHg.  2. Probable chronic atrial fibrillation, RVR noted at presentation, however better with intravenous diltiazem. She did undergo a successful cardioversion back in 2013, however reportedly reverted to atrial fibrillation within 6 months. CHADSVASC score is 3, and she has been on Eliquis.  3. Essential hypertension with history of accelerated hypertension based on chart review. Blood pressure currently not well controlled.  4. CKD stage III, creatinine 1.7.  5. History of ectatic coronaries without significant obstruction as of 2006. No active angina symptoms.  Recommendations  Chart reviewed, situation discussed with the patient. Medications will be adjusted including discontinuation of Norvasc for now, initiation of oral diltiazem for rate control of atrial fibrillation (stop diltiazem drip), switch divided dose Lasix to Lasix drip beginning at 5 mg/h, and discontinuation of Coreg. Otherwise continue hydralazine, Eliquis, and potassium supplements. She is markedly volume overloaded and will require several days of diuresis to get back toward baseline. I have recommended transfer to [facility] so that she can be further evaluated by the CHF team, Dr. Haroldine Laws to see. Follow-up echocardiogram will be obtained, she will also most likely eventually need to have a repeat right heart catheterization. Further consideration could be given to repeat attempt at cardioversion for atrial fibrillation, although I suspect that it will be quite difficult for her to maintain sinus rhythm longterm. She seemed to be tolerating it reasonably well at her last CHF outpatient office visit.  Satira Sark, M.D., F.A.C.C.

## 2014-12-07 NOTE — Progress Notes (Signed)
Called report to [facility] RN on 2-H. [facility] transported at 1443 with no difficulty and family transported all belongings. Patient was awake and oriented with no complaints at time of transfer.

## 2014-12-07 NOTE — Progress Notes (Signed)
   [name] was transferred from [facility] to [facility] for HF management.   She had been followed in the HF clinic but lost to follow up. Has not been seen since 12/2012.  At that time she was 240 pounds. Says she has been taking 40 mg of torsemide daily  but has not been weighing.   On exam she presents with marked volume overload, A fib with controlled rate,  and hypertensive. 3+ lower extremity Increase lasix drip to 10 mg per hour. Give hydralazine and clonidine now. Prn Hydralazine ordered.   ECHO today with EF 55-60%. LA severely dilated. RV normal RA moderately dilated, Severe LVH.  IVC dilated.   Place PICC line for CVP and to help guide therapies.   1. A/C Diastolic HF- As above increase lasix drip to 10 mg per hour. Follow BMET closely.  2. A fib controlled rate- Most recent DC-CV 2013 successful but has been back in A fib since 2014. Restart low dose carvedilol. Continue eliquis.  3. HTN, uncontrolled.  4. Obesity  Dr Haroldine Laws to follow in am . Follow renal function closely. Likely move to SDU tomorrow.   Darrick Grinder NP-C  4:13 PM  Patient seen and examined with Darrick Grinder, NP. We discussed all aspects of the encounter. I agree with the assessment and plan as stated above.   Agree with above. She is markedly volume overloaded. Proceed with IV diuresis.   Reino Lybbert, Quillian Quince

## 2014-12-07 NOTE — Progress Notes (Signed)
eLink Physician-Brief Progress Note Patient Name: [name] DOB: Mar 03, 1970 MRN: [id number]   Date of Service  12/07/2014  HPI/Events of Note  44 yo female with PMH of AFIB/RVR, Chronic Diastolic Heart Failure, Accelerated HTN and Pulmonary HTN. Admitted with AFIB/RVR. Currently on a Cardizem IV infusion with HR now = 91. Medical regimen also includes Amlodipine, Eliquis, Coreg, Clonidine, Lasix, Hydralazine PO/Hydralazine IV PRN and Protonix.   eICU Interventions  Continue present management.      Intervention Category Evaluation Type: New Patient Evaluation  Sommer,Steven Eugene 12/07/2014, 1:50 AM

## 2014-12-07 NOTE — H&P (Signed)
[facility] History and Physical  [name] UXL:[id number] DOB: 07-Jan-1971    PCP:   Maggie Font, MD   Chief Complaint: progressive SOB over the past week.   HPI: [name] is an 44 y.o. female with hx of cardiomyopathy, ? Infiltrative though was unclear, prior biopsy negative for Amyloidosis, hx of PAF on Eliquis, with prior succesful cardioversion, hx of severe HTN, morbid obesity, CKD3 with Cr 1.5-1.7, prior Cath 2006 with ectatic coronaries without obstruction, severe pulmonary HTN with pressure 70's, compliant with her meds, presented to the ER with progressive SOB over the past week.  She was feeling well prior to that.  She did not have any chest pain, fever, chills, coughs, but admitted to having bilateral leg swelling.  Evalaution in the ER showed EKG with afib and RVR, CXR showed only mild vascular congestion and BNP of 220.  Her troponin was 0.04, Cr was 1.64, and Hb of 11 grams per dL. She has no leukocytosis for fever.  She was given IV Diltiazem, along with IV Lasix, and diuresed about 500cc when I saw her, and felt better.  Her rate went to 100's.  Hospitalist was asked to admit her for SOB, mild CHF in a patient with known cardiomyopathy, afib with RVR.  Her niece spoke with a " cardiologist" and raised question as to whether she should be transferred to [facility].  I offered, but she would like to stay here at [facility].  She is currently not in any distress.   Rewiew of Systems:  Constitutional: Negative for malaise, fever and chills. No significant weight loss or weight gain Eyes: Negative for eye pain, redness and discharge, diplopia, visual changes, or flashes of light. ENMT: Negative for ear pain, hoarseness, nasal congestion, sinus pressure and sore throat. No headaches; tinnitus, drooling, or problem swallowing. Cardiovascular: Negative for  palpitations, diaphoresis,    No orthopnea, PND Respiratory: Negative for cough, hemoptysis, wheezing and stridor. No pleuritic  chestpain. Gastrointestinal: Negative for nausea, vomiting, diarrhea, constipation, abdominal pain, melena, blood in stool, hematemesis, jaundice and rectal bleeding.    Genitourinary: Negative for frequency, dysuria, incontinence,flank pain and hematuria; Musculoskeletal: Negative for back pain and neck pain. Negative for swelling and trauma.;  Skin: . Negative for pruritus, rash, abrasions, bruising and skin lesion.; ulcerations Neuro: Negative for headache, lightheadedness and neck stiffness. Negative for weakness, altered level of consciousness , altered mental status, extremity weakness, burning feet, involuntary movement, seizure and syncope.  Psych: negative for anxiety, depression, insomnia, tearfulness, panic attacks, hallucinations, paranoia, suicidal or homicidal ideation    Past Medical History  Diagnosis Date  . Essential hypertension, benign     History of accelerated hypertension with urgency  . Atrial flutter ([place])     Documented 2006 - [place]  . Gout   . History of cardiac catheterization     Ectatic coronaries without obstruction 2006 - [facility]  . Morbid obesity ([place])   . Hypertensive heart disease     LVEF reportedly 50-55% in 2006 - [place]  . CKD (chronic kidney disease) stage 3, GFR 30-59 ml/min   . Anemia     Past Surgical History  Procedure Laterality Date  . Skin biopsy  12/18/2011    Procedure: BIOPSY SKIN;  Surgeon: Donato Heinz, MD;  Location: [facility];  Service: General;  Laterality: N/A;  in the minor room.  Darden Dates without cardioversion  01/15/2012    Procedure: TRANSESOPHAGEAL ECHOCARDIOGRAM (TEE);  Surgeon: Jolaine Artist, MD;  Location: [facility]  ENDOSCOPY;  Service: Cardiovascular;  Laterality: N/A;  cardioversion/on xarelto  . Cardioversion  01/15/2012    Procedure: CARDIOVERSION;  Surgeon: Jolaine Artist, MD;  Location: [facility] ENDOSCOPY;  Service: Cardiovascular;  Laterality: N/A;  . Right heart catheterization N/A 12/26/2011    Procedure: RIGHT HEART CATH;   Surgeon: Jolaine Artist, MD;  Location: [facility] CATH LAB;  Service: Cardiovascular;  Laterality: N/A;    Medications:  HOME MEDS: Prior to Admission medications   Medication Sig Start Date End Date Taking? Authorizing Provider  acetaminophen (TYLENOL) 325 MG tablet Take 2 tablets (650 mg total) by mouth every 4 (four) hours as needed. 01/04/12  Yes Theodis Blaze, MD  allopurinol (ZYLOPRIM) 100 MG tablet Take 100 mg by mouth daily.  01/04/12  Yes Theodis Blaze, MD  amLODipine (NORVASC) 10 MG tablet Take 1 tablet (10 mg total) by mouth daily. 12/20/11  Yes Nimish Luther Parody, MD  apixaban (ELIQUIS) 5 MG TABS tablet Take 5 mg by mouth 2 (two) times daily.   Yes Historical Provider, MD  carvedilol (COREG) 3.125 MG tablet Take 3.125 mg by mouth 2 (two) times daily with a meal.   Yes Historical Provider, MD  cloNIDine (CATAPRES) 0.1 MG tablet Take 0.1 mg by mouth 3 (three) times daily.   Yes Historical Provider, MD  folic acid (FOLVITE) 1 MG tablet Take 1 tablet (1 mg total) by mouth daily. 12/20/11  Yes Nimish Luther Parody, MD  hydrALAZINE (APRESOLINE) 100 MG tablet Take 1 tablet (100 mg total) by mouth 3 (three) times daily. MUST HAVE FOLLOW UP APPOINTMENT FOR FURTHER REFILLS 03/08/14  Yes Jolaine Artist, MD  loratadine (CLARITIN) 10 MG tablet Take 10 mg by mouth daily.   Yes Historical Provider, MD  pantoprazole (PROTONIX) 40 MG tablet Take 1 tablet (40 mg total) by mouth daily. 12/20/11  Yes Nimish Luther Parody, MD  potassium chloride SA (K-DUR,KLOR-CON) 20 MEQ tablet Take 20 mEq by mouth daily as needed.   Yes Historical Provider, MD  torsemide (DEMADEX) 20 MG tablet TAKE 2 TABLETS BY MOUTH EVERY OTHER DAY 11/12/14  Yes Jolaine Artist, MD     Allergies:  Allergies  Allergen Reactions  . Penicillins Hives    Childhood allergy. Has patient had a PCN reaction causing immediate rash, facial/tongue/throat swelling, SOB or lightheadedness with hypotension: No NO Has patient had a PCN reaction  causing severe rash involving mucus membranes or skin necrosis: No NO Has patient had a PCN reaction that required hospitalization No NO Has patient had a PCN reaction occurring within the last 10 years: No NO If all of the above answers are "NO", then may pro    Social History:   reports that she has never smoked. She has never used smokeless tobacco. She reports that she does not drink alcohol or use illicit drugs.  Family History: Family History  Problem Relation Age of Onset  . Hypertension    . Hypertension Mother   . Diabetes Mother      Physical Exam: Filed Vitals:   12/06/14 2306 12/06/14 2317 12/06/14 2330 12/06/14 2359  BP: 183/116  206/91 143/74  Pulse: 99  102 103  Temp:      TempSrc:      Resp:      Height:      Weight:      SpO2:  96% 99% 99%   Blood pressure 143/74, pulse 103, temperature 98.2 F (36.8 C), temperature source Oral, resp. rate 20, height 5\' 10"  (  1.778 m), weight 117.935 kg (260 lb), last menstrual period 11/29/2014, SpO2 99 %.  GEN:  Pleasant  patient lying in the stretcher in no acute distress; cooperative with exam. PSYCH:  alert and oriented x4; does not appear anxious or depressed; affect is appropriate. HEENT: Mucous membranes pink and anicteric; PERRLA; EOM intact; no cervical lymphadenopathy nor thyromegaly or carotid bruit; no JVD; There were no stridor. Neck is very supple. Breasts:: Not examined CHEST WALL: No tenderness CHEST: Normal respiration, clear to auscultation bilaterally.  HEART: irregular rate and rhythm.  There are no murmur, rub, or gallops.   BACK: No kyphosis or scoliosis; no CVA tenderness ABDOMEN: soft and non-tender; no masses, no organomegaly, normal abdominal bowel sounds; no pannus; no intertriginous candida. There is no rebound and no distention. Rectal Exam: Not done EXTREMITIES: No bone or joint deformity; age-appropriate arthropathy of the hands and knees; 2+ edema,  no ulcerations.  There is no calf  tenderness. Genitalia: not examined PULSES: 2+ and symmetric SKIN: Normal hydration no rash or ulceration CNS: Cranial nerves 2-12 grossly intact no focal lateralizing neurologic deficit.  Speech is fluent; uvula elevated with phonation, facial symmetry and tongue midline. DTR are normal bilaterally, cerebella exam is intact, barbinski is negative and strengths are equaled bilaterally.  No sensory loss.   Labs on Admission:  Basic Metabolic Panel:  Recent Labs Lab 12/06/14 2218  NA 137  K 4.2  CL 105  CO2 22  GLUCOSE 120*  BUN 28*  CREATININE 1.64*  CALCIUM 8.0*   Liver Function Tests:  Recent Labs Lab 12/06/14 2218  AST 20  ALT 11*  ALKPHOS 115  BILITOT 1.1  PROT 8.6*  ALBUMIN 3.9   CBC:  Recent Labs Lab 12/06/14 2218  WBC 8.2  NEUTROABS 6.3  HGB 11.1*  HCT 35.7*  MCV 88.4  PLT 207   Cardiac Enzymes:  Recent Labs Lab 12/06/14 2218  TROPONINI 0.04*    CBG: Radiological Exams on Admission: Dg Chest Port 1 View  12/06/2014   CLINICAL DATA:  44 year old female shortness of breath and lower extremity edema. Initial encounter.  EXAM: PORTABLE CHEST 1 VIEW  COMPARISON:  Portable chest 12/24/2011 and earlier.  FINDINGS: Portable AP upright view at 2153 hours. Large body habitus. There is cardiomegaly. Other mediastinal contours are within normal limits. Pulmonary vascular congestion appears stable to that in 2013. Increased retrocardiac opacity. No pneumothorax or definite effusion. No other confluent pulmonary opacity.  IMPRESSION: Cardiomegaly with pulmonary vascular congestion, no overt edema. No definite effusion. Retrocardiac opacity which could reflect atelectasis or consolidation.   Electronically Signed   By: Genevie Ann M.D.   On: 12/06/2014 22:24    EKG: Independently reviewed.  Assessment/Plan Present on Admission:  . Atrial fibrillation with controlled ventricular response ([place]) . Chronic diastolic heart failure ([place]) . Acute on chronic diastolic  heart failure ([place]) . Accelerated hypertension . Morbid obesity ([place]) . Pulmonary HTN-severe 26mmHg . Atrial fibrillation with rapid ventricular response ([facility])  PLAN:  Afib with RVR:  Will continue with IV cardiazem and admit to ICU tonight.   Will continue with her Coreg for now.  Check TSH.  Will need to obtain ECHO since her last ECHO was about 2 years ago.  Consult cardiology tomorrow for further recommendation.   Continue with Eliquis (though Cr greater than 1.5, her body weight will allow regular dosing).    HTN:  She is on several anti HTN meds, and will continue her home meds. Will use Hydrazine PRN  for severe HTN.  CHF:  Acute on chronic diastolic CHF.  Will use gentle IV Lasix BID.   CKD:  Her Cr is 1.64.  Her allopurinol is renally adjusted.  Avoid nephrotoxic drug.    Other plans as per orders.  Code Status: FULL Haskel Khan, MD. [facility] Pager [phone] 7pm to 7am.  12/07/2014, 12:05 AM

## 2014-12-08 ENCOUNTER — Inpatient Hospital Stay (HOSPITAL_COMMUNITY): Payer: Medicare Other

## 2014-12-08 DIAGNOSIS — I130 Hypertensive heart and chronic kidney disease with heart failure and stage 1 through stage 4 chronic kidney disease, or unspecified chronic kidney disease: Secondary | ICD-10-CM | POA: Diagnosis not present

## 2014-12-08 LAB — BASIC METABOLIC PANEL
ANION GAP: 15 (ref 5–15)
Anion gap: 8 (ref 5–15)
BUN: 25 mg/dL — AB (ref 6–20)
BUN: 23 mg/dL — ABNORMAL HIGH (ref 6–20)
CALCIUM: 8.3 mg/dL — AB (ref 8.9–10.3)
CALCIUM: 9.2 mg/dL (ref 8.9–10.3)
CO2: 26 mmol/L (ref 22–32)
CO2: 25 mmol/L (ref 22–32)
CREATININE: 1.75 mg/dL — AB (ref 0.44–1.00)
CREATININE: 1.84 mg/dL — AB (ref 0.44–1.00)
Chloride: 106 mmol/L (ref 101–111)
Chloride: 102 mmol/L (ref 101–111)
GFR calc Af Amer: 40 mL/min — ABNORMAL LOW (ref >60)
GFR, EST AFRICAN AMERICAN: 38 mL/min — AB (ref >60)
GFR, EST NON AFRICAN AMERICAN: 35 mL/min — AB (ref >60)
GFR, EST NON AFRICAN AMERICAN: 33 mL/min — AB (ref >60)
GLUCOSE: 91 mg/dL (ref 65–99)
GLUCOSE: 100 mg/dL — AB (ref 65–99)
Potassium: 3.6 mmol/L (ref 3.5–5.1)
Potassium: 3.5 mmol/L (ref 3.5–5.1)
Sodium: 140 mmol/L (ref 135–145)
Sodium: 142 mmol/L (ref 135–145)

## 2014-12-08 MED ORDER — POTASSIUM CHLORIDE CRYS ER 20 MEQ PO TBCR
40.0000 meq | EXTENDED_RELEASE_TABLET | Freq: Two times a day (BID) | ORAL | Status: DC
  Administered 2014-12-08 – 2014-12-10 (×4): 40 meq via ORAL
  Filled 2014-12-08 (×4): qty 2

## 2014-12-08 MED ORDER — SODIUM CHLORIDE 0.9 % IJ SOLN
10.0000 mL | Freq: Two times a day (BID) | INTRAMUSCULAR | Status: DC
  Administered 2014-12-08 – 2014-12-12 (×7): 10 mL

## 2014-12-08 MED ORDER — POTASSIUM CHLORIDE CRYS ER 20 MEQ PO TBCR
40.0000 meq | EXTENDED_RELEASE_TABLET | Freq: Once | ORAL | Status: AC
  Administered 2014-12-08: 40 meq via ORAL
  Filled 2014-12-08: qty 2

## 2014-12-08 MED ORDER — COLCHICINE 0.6 MG PO TABS
0.6000 mg | ORAL_TABLET | Freq: Every day | ORAL | Status: DC
  Administered 2014-12-08 – 2014-12-10 (×3): 0.6 mg via ORAL
  Filled 2014-12-08 (×5): qty 1

## 2014-12-08 MED ORDER — HYDROCODONE-ACETAMINOPHEN 5-325 MG PO TABS
1.0000 | ORAL_TABLET | Freq: Four times a day (QID) | ORAL | Status: DC | PRN
  Administered 2014-12-08 – 2014-12-12 (×7): 1 via ORAL
  Filled 2014-12-08 (×7): qty 1

## 2014-12-08 MED ORDER — AMLODIPINE BESYLATE 5 MG PO TABS
5.0000 mg | ORAL_TABLET | Freq: Every day | ORAL | Status: DC
  Administered 2014-12-08 – 2014-12-09 (×2): 5 mg via ORAL
  Filled 2014-12-08 (×2): qty 1

## 2014-12-08 MED ORDER — SODIUM CHLORIDE 0.9 % IJ SOLN: 10.0000 mL | INTRAMUSCULAR | Status: DC | PRN

## 2014-12-08 NOTE — Progress Notes (Signed)
Advanced Heart Failure Rounding Note   Subjective:    Transferred from [place] for HF management.  12/07/2014 ECHO EF 55-60%. LA severely dilated. RV normal RA moderately dilated, Severe LVH. IVC dilated.   Yesterday lasix drip started and increased to 10 mg per hour. Brisk diuresis noted. Weight down 10 pounds.   Denies SOB.   Objective:   Weight Range:  Vital Signs:   Temp:  [97.8 F (36.6 C)-98.2 F (36.8 C)] 98 F (36.7 C) (10/12 0700) Pulse Rate:  [63-100] 86 (10/12 0930) Resp:  [14-25] 22 (10/12 0930) BP: (134-208)/(62-110) 155/98 mmHg (10/12 0930) SpO2:  [92 %-100 %] 99 % (10/12 0930) Weight:  [334 lb (151.501 kg)-343 lb 14.7 oz (156 kg)] 334 lb (151.501 kg) (10/12 0500) Last BM Date: 12/06/14  Weight change: Filed Weights   12/07/14 0500 12/07/14 1559 12/08/14 0500  Weight: 344 lb 9.3 oz (156.3 kg) 343 lb 14.7 oz (156 kg) 334 lb (151.501 kg)    Intake/Output:   Intake/Output Summary (Last 24 hours) at 12/08/14 1012 Last data filed at 12/08/14 1000  Gross per 24 hour  Intake 1477.83 ml  Output  10050 ml  Net -8572.17 ml     Physical Exam: General:  Well appearing. No resp difficulty. In bed.  HEENT: normal Neck: supple. JVP to jaw . Carotids 2+ bilat; no bruits. No lymphadenopathy or thryomegaly appreciated. Cor: PMI nondisplaced. Irregular rate & rhythm. No rubs, gallops or murmurs. Lungs: clear Abdomen: obese, soft, nontender, nondistended. No hepatosplenomegaly. No bruits or masses. Good bowel sounds. Extremities: no cyanosis, clubbing, rash, R and LLE 3+edema Neuro: alert & orientedx3, cranial nerves grossly intact. moves all 4 extremities w/o difficulty. Affect pleasant  Telemetry: A fib 80s   Labs: Basic Metabolic Panel:  Recent Labs Lab 12/06/14 2218 12/07/14 0416 12/08/14 0125 12/08/14 0536  NA 137 141 140 142  K 4.2 3.4* 3.6 3.5  CL 105 110 106 102  CO2 22 25 26 25   GLUCOSE 120* 92 91 100*  BUN 28* 28* 25* 23*  CREATININE 1.64*  1.71* 1.75* 1.84*  CALCIUM 8.0* 7.9* 8.3* 9.2    Liver Function Tests:  Recent Labs Lab 12/06/14 2218  AST 20  ALT 11*  ALKPHOS 115  BILITOT 1.1  PROT 8.6*  ALBUMIN 3.9   No results for input(s): LIPASE, AMYLASE in the last 168 hours. No results for input(s): AMMONIA in the last 168 hours.  CBC:  Recent Labs Lab 12/06/14 2218  WBC 8.2  NEUTROABS 6.3  HGB 11.1*  HCT 35.7*  MCV 88.4  PLT 207    Cardiac Enzymes:  Recent Labs Lab 12/06/14 2218  TROPONINI 0.04*    BNP: BNP (last 3 results)  Recent Labs  12/06/14 2218  BNP 220.0*    ProBNP (last 3 results) No results for input(s): PROBNP in the last 8760 hours.    Other results:  Imaging: Dg Chest Port 1 View  12/06/2014  CLINICAL DATA:  44 year old female shortness of breath and lower extremity edema. Initial encounter. EXAM: PORTABLE CHEST 1 VIEW COMPARISON:  Portable chest 12/24/2011 and earlier. FINDINGS: Portable AP upright view at 2153 hours. Large body habitus. There is cardiomegaly. Other mediastinal contours are within normal limits. Pulmonary vascular congestion appears stable to that in 2013. Increased retrocardiac opacity. No pneumothorax or definite effusion. No other confluent pulmonary opacity. IMPRESSION: Cardiomegaly with pulmonary vascular congestion, no overt edema. No definite effusion. Retrocardiac opacity which could reflect atelectasis or consolidation. Electronically Signed   By:  Genevie Ann M.D.   On: 12/06/2014 22:24      Medications:     Scheduled Medications: . allopurinol  100 mg Oral Daily  . antiseptic oral rinse  7 mL Mouth Rinse BID  . apixaban  5 mg Oral BID  . carvedilol  3.125 mg Oral BID WC  . cloNIDine  0.1 mg Oral TID  . colchicine  0.6 mg Oral Daily  . folic acid  1 mg Oral Daily  . hydrALAZINE  100 mg Oral TID  . Influenza vac split quadrivalent PF  0.5 mL Intramuscular Tomorrow-1000  . pantoprazole  40 mg Oral Daily  . potassium chloride SA  20 mEq Oral  Daily  . sodium chloride  3 mL Intravenous Q12H     Infusions: . furosemide (LASIX) infusion 10 mg/hr (12/07/14 1700)     PRN Medications:  hydrALAZINE, HYDROcodone-acetaminophen, ondansetron **OR** ondansetron (ZOFRAN) IV   Assessment:    1. A/C Diastolic HF- As above increase lasix drip to 10 mg per hour. Follow BMET closely.  2. A fib controlled rate- Most recent DC-CV 2013 successful but has been back in A fib since 2014. Restart low dose carvedilol. Continue eliquis.  3. HTN, uncontrolled.  4. Obesity   Plan/Discussion:    Brisk diuresis noted. Weight down 10 pounds. Place PICC today to guide therapy. Continue lasix drip at 10 mg per hour.    BP better controlled but still elevated. Add amlodipine 5 mg daily . Continue hydralazine 100 mg tid + clonidine 0.1 mg tid.    Rate controlled. Continue low dose carvedilol and eliquis 5 mg twice daily.   Consult cardiac rehab.   Transfer to SDU.    Length of Stay: 1   Amy Clegg NP-C  12/08/2014, 10:12 AM  Advanced Heart Failure Team Pager [phone] (M-F; 7a - 4p)  Please contact [facility] for night-coverage after hours (4p -7a ) and weekends on [email or web address]  Patient seen and examined with Darrick Grinder, NP. We discussed all aspects of the encounter. I agree with the assessment and plan as stated above.   She is markedly fluid overloaded. Responding well to IV diuresis. Place PICC today. Continue Eliquis for AF.  Ellice Boultinghouse,MD 10:48 AM

## 2014-12-08 NOTE — Progress Notes (Signed)
Peripherally Inserted Central Catheter/Midline Placement  The IV Nurse has discussed with the patient and/or persons authorized to consent for the patient, the purpose of this procedure and the potential benefits and risks involved with this procedure.  The benefits include less needle sticks, lab draws from the catheter and patient may be discharged home with the catheter.  Risks include, but not limited to, infection, bleeding, blood clot (thrombus formation), and puncture of an artery; nerve damage and irregular heat beat.  Alternatives to this procedure were also discussed.  PICC/Midline Placement Documentation  PICC / Midline Double Lumen 08/81/10 PICC Right Basilic (Active)     PICC / Midline Double Lumen 31/59/45 PICC Right Basilic 42 cm 0 cm (Active)  Indication for Insertion or Continuance of Line Prolonged intravenous therapies 12/08/2014 11:00 AM  Exposed Catheter (cm) 0 cm 12/08/2014 11:00 AM  Dressing Change Due 12/15/14 12/08/2014 11:00 AM       Jule Economy Horton 12/08/2014, 11:41 AM

## 2014-12-09 LAB — BASIC METABOLIC PANEL
Anion gap: 8 (ref 5–15)
BUN: 23 mg/dL — AB (ref 6–20)
CO2: 30 mmol/L (ref 22–32)
CREATININE: 2.04 mg/dL — AB (ref 0.44–1.00)
Calcium: 8.4 mg/dL — ABNORMAL LOW (ref 8.9–10.3)
Chloride: 99 mmol/L — ABNORMAL LOW (ref 101–111)
GFR, EST AFRICAN AMERICAN: 34 mL/min — AB (ref >60)
GFR, EST NON AFRICAN AMERICAN: 29 mL/min — AB (ref >60)
Glucose, Bld: 134 mg/dL — ABNORMAL HIGH (ref 65–99)
POTASSIUM: 3.6 mmol/L (ref 3.5–5.1)
SODIUM: 137 mmol/L (ref 135–145)

## 2014-12-09 LAB — MAGNESIUM: MAGNESIUM: 1.6 mg/dL — AB (ref 1.7–2.4)

## 2014-12-09 MED ORDER — POTASSIUM CHLORIDE CRYS ER 20 MEQ PO TBCR
40.0000 meq | EXTENDED_RELEASE_TABLET | Freq: Once | ORAL | Status: AC
  Administered 2014-12-09: 40 meq via ORAL
  Filled 2014-12-09: qty 2

## 2014-12-09 MED ORDER — MAGNESIUM SULFATE 4 GM/100ML IV SOLN
4.0000 g | Freq: Once | INTRAVENOUS | Status: AC
  Administered 2014-12-09: 4 g via INTRAVENOUS
  Filled 2014-12-09: qty 100

## 2014-12-09 NOTE — Progress Notes (Signed)
Called Cards fellow to see if we wanted to check CVP's since [name] has a PICC line now. MD was unsure of plan for the PICC line and held off on ordering CVP's

## 2014-12-09 NOTE — Progress Notes (Signed)
Advanced Heart Failure Rounding Note   Subjective:    Transferred from [place] for HF management.  12/07/2014 ECHO EF 55-60%. LA severely dilated. RV normal RA moderately dilated, Severe LVH. IVC dilated.   Yesterday she continued to diurese with Iasix drip. Diuresed 9 liters. Weight down 15 pounds.   Denies SOB.   Objective:   Weight Range:  Vital Signs:   Temp:  [98 F (36.7 C)-98.3 F (36.8 C)] 98 F (36.7 C) (10/13 0400) Pulse Rate:  [74-111] 90 (10/13 0500) Resp:  [18-33] 23 (10/13 0500) BP: (108-188)/(43-116) 108/69 mmHg (10/13 0500) SpO2:  [91 %-100 %] 92 % (10/13 0500) Weight:  [319 lb 12.8 oz (145.06 kg)] 319 lb 12.8 oz (145.06 kg) (10/13 0500) Last BM Date: 12/06/14  Weight change: Filed Weights   12/07/14 1559 12/08/14 0500 12/09/14 0500  Weight: 343 lb 14.7 oz (156 kg) 334 lb (151.501 kg) 319 lb 12.8 oz (145.06 kg)    Intake/Output:   Intake/Output Summary (Last 24 hours) at 12/09/14 0725 Last data filed at 12/09/14 0600  Gross per 24 hour  Intake    692 ml  Output   9050 ml  Net  -8358 ml     Physical Exam: CVP 9  General:  Well appearing. No resp difficulty. In bed.  HEENT: normal Neck: supple. JVP -10  . Carotids 2+ bilat; no bruits. No lymphadenopathy or thryomegaly appreciated. Cor: PMI nondisplaced. Irregular rate & rhythm. No rubs, gallops or murmurs. Lungs: clear Abdomen: obese, soft, nontender, nondistended. No hepatosplenomegaly. No bruits or masses. Good bowel sounds. Extremities: no cyanosis, clubbing, rash, R and LLE 2edema. RUE PICC Neuro: alert & orientedx3, cranial nerves grossly intact. moves all 4 extremities w/o difficulty. Affect pleasant  Telemetry: A fib 80s   Labs: Basic Metabolic Panel:  Recent Labs Lab 12/06/14 2218 12/07/14 0416 12/08/14 0125 12/08/14 0536 12/09/14 0400  NA 137 141 140 142 137  K 4.2 3.4* 3.6 3.5 3.6  CL 105 110 106 102 99*  CO2 22 25 26 25 30   GLUCOSE 120* 92 91 100* 134*  BUN 28* 28* 25*  23* 23*  CREATININE 1.64* 1.71* 1.75* 1.84* 2.04*  CALCIUM 8.0* 7.9* 8.3* 9.2 8.4*    Liver Function Tests:  Recent Labs Lab 12/06/14 2218  AST 20  ALT 11*  ALKPHOS 115  BILITOT 1.1  PROT 8.6*  ALBUMIN 3.9   No results for input(s): LIPASE, AMYLASE in the last 168 hours. No results for input(s): AMMONIA in the last 168 hours.  CBC:  Recent Labs Lab 12/06/14 2218  WBC 8.2  NEUTROABS 6.3  HGB 11.1*  HCT 35.7*  MCV 88.4  PLT 207    Cardiac Enzymes:  Recent Labs Lab 12/06/14 2218  TROPONINI 0.04*    BNP: BNP (last 3 results)  Recent Labs  12/06/14 2218  BNP 220.0*    ProBNP (last 3 results) No results for input(s): PROBNP in the last 8760 hours.    Other results:  Imaging: Dg Chest Port 1 View  12/08/2014  CLINICAL DATA:  Post insertion of PICC line. EXAM: PORTABLE CHEST 1 VIEW COMPARISON:  12/06/2014 FINDINGS: Interval placement of right PICC line. The tip is in the SVC. Mild cardiomegaly and vascular congestion. No overt edema. No confluent opacity or effusion. No acute bony abnormality. IMPRESSION: Right PICC line tip in the SVC. Cardiomegaly, vascular congestion. Electronically Signed   By: Rolm Baptise M.D.   On: 12/08/2014 12:16     Medications:  Scheduled Medications: . allopurinol  100 mg Oral Daily  . amLODipine  5 mg Oral Daily  . antiseptic oral rinse  7 mL Mouth Rinse BID  . apixaban  5 mg Oral BID  . carvedilol  3.125 mg Oral BID WC  . cloNIDine  0.1 mg Oral TID  . colchicine  0.6 mg Oral Daily  . folic acid  1 mg Oral Daily  . hydrALAZINE  100 mg Oral TID  . pantoprazole  40 mg Oral Daily  . potassium chloride SA  40 mEq Oral BID  . potassium chloride SA  40 mEq Oral Once  . sodium chloride  10-40 mL Intracatheter Q12H  . sodium chloride  3 mL Intravenous Q12H    Infusions: . furosemide (LASIX) infusion 10 mg/hr (12/08/14 1516)    PRN Medications: hydrALAZINE, HYDROcodone-acetaminophen, ondansetron **OR**  ondansetron (ZOFRAN) IV, sodium chloride   Assessment:    1. A/C Diastolic HF- As above increase lasix drip to 10 mg per hour. Follow BMET closely.  2. A fib controlled rate- Most recent DC-CV 2013 successful but has been back in A fib since 2014. Restart low dose carvedilol. Continue eliquis.  3. HTN, uncontrolled.  4. Obesity   Plan/Discussion:    Renal function trending up. PICC placed. CVP now 9. Brisk diuresis noted. Cut lasix 5 mg per hour. Supplement K.   BP much improved. Continue amlodipine 5 mg daily . Continue hydralazine 100 mg tid + clonidine 0.1 mg tid.   Rate controlled. Continue low dose carvedilol and eliquis 5 mg twice daily.   Consult cardiac rehab.   Transfer to SDU.    Length of Stay: 2   Amy Clegg NP-C  12/09/2014, 7:25 AM  Advanced Heart Failure Team Pager [phone] (M-F; 7a - 4p)  Please contact [facility] for night-coverage after hours (4p -7a ) and weekends on [email or web address]   Patient seen and examined with Darrick Grinder, NP. We discussed all aspects of the encounter. I agree with the assessment and plan as stated above.   Improving with diuresis but creatinine up slightly. Will cut lasix gtt back to 5. Watch renal function closely. BP improved. AF rate good. [place] Eliquis.   Bensimhon, Daniel,MD 5:06 PM

## 2014-12-10 LAB — BASIC METABOLIC PANEL
ANION GAP: 8 (ref 5–15)
BUN: 32 mg/dL — ABNORMAL HIGH (ref 6–20)
CALCIUM: 7.8 mg/dL — AB (ref 8.9–10.3)
CO2: 28 mmol/L (ref 22–32)
Chloride: 98 mmol/L — ABNORMAL LOW (ref 101–111)
Creatinine, Ser: 2.4 mg/dL — ABNORMAL HIGH (ref 0.44–1.00)
GFR, EST AFRICAN AMERICAN: 28 mL/min — AB (ref >60)
GFR, EST NON AFRICAN AMERICAN: 24 mL/min — AB (ref >60)
Glucose, Bld: 128 mg/dL — ABNORMAL HIGH (ref 65–99)
Potassium: 4 mmol/L (ref 3.5–5.1)
Sodium: 134 mmol/L — ABNORMAL LOW (ref 135–145)

## 2014-12-10 LAB — CBC
HCT: 34.3 % — ABNORMAL LOW (ref 36.0–46.0)
HEMOGLOBIN: 10.9 g/dL — AB (ref 12.0–15.0)
MCH: 28 pg (ref 26.0–34.0)
MCHC: 31.8 g/dL (ref 30.0–36.0)
MCV: 88.2 fL (ref 78.0–100.0)
PLATELETS: 191 10*3/uL (ref 150–400)
RBC: 3.89 MIL/uL (ref 3.87–5.11)
RDW: 17.1 % — ABNORMAL HIGH (ref 11.5–15.5)
WBC: 10.5 10*3/uL (ref 4.0–10.5)

## 2014-12-10 LAB — URIC ACID: URIC ACID, SERUM: 9.8 mg/dL — AB (ref 2.3–6.6)

## 2014-12-10 MED ORDER — AMLODIPINE BESYLATE 10 MG PO TABS
10.0000 mg | ORAL_TABLET | Freq: Every day | ORAL | Status: DC
  Administered 2014-12-10 – 2014-12-12 (×3): 10 mg via ORAL
  Filled 2014-12-10 (×3): qty 1

## 2014-12-10 MED ORDER — TORSEMIDE 20 MG PO TABS
40.0000 mg | ORAL_TABLET | Freq: Every day | ORAL | Status: DC
  Administered 2014-12-10 – 2014-12-12 (×3): 40 mg via ORAL
  Filled 2014-12-10 (×3): qty 2

## 2014-12-10 NOTE — Care Management Important Message (Signed)
Important Message  Patient Details  Name: [name] MRN: [id number] Date of Birth: March 10, 1970   Medicare Important Message Given:  Yes-second notification given    Nathen May 12/10/2014, 10:21 AM

## 2014-12-10 NOTE — Progress Notes (Signed)
Assume care from off going RN: patient on lasix drip @ 5cc; d/c during morning; CVP @ 0802 was 13; _0  MD completed another CVP; patient has c/o's left knee pain 8 on pain scale 0-10; med given; see MMR; family @ bedside

## 2014-12-10 NOTE — Progress Notes (Signed)
CARDIAC REHAB PHASE I   PRE:  Rate/Rhythm: 87 afib with PVCs    BP: sitting 105/60    SaO2: 95 RA  MODE:  Ambulation: to door   POST:  Rate/Rhythm: 101 afib    BP: sitting 167/77     SaO2: 90-93 RA  Pt sts gout in left knee is her biggest hindrance right now. Received pain meds but still painful with walking. Used RW, gait belt, assist x2 to outside of her room door then returned to EOB. Denied SOB, sts she feels good besides knee. Reviewed ed with pt (low sodium, daily wts, taking meds). Voiced understanding. [place], [place], ACSM 12/10/2014 3:04 PM

## 2014-12-10 NOTE — Progress Notes (Signed)
Advanced Heart Failure Rounding Note   Subjective:    Transferred from [place] for HF management.  12/07/2014 ECHO EF 55-60%. LA severely dilated. RV normal RA moderately dilated, Severe LVH. IVC dilated.   Yesterday she continued to diurese with Iasix drip. Lasix drip cut back to 5 mg per hour. Diuresed 4.9 liters. Weight down 6 pounds.   Denies SOB. Complaining of L knee pain. Intolerant ted hose.    Creatinine 1.84>2.04>2.4  Objective:   Weight Range:  Vital Signs:   Temp:  [97.7 F (36.5 C)-98.9 F (37.2 C)] 98.9 F (37.2 C) (10/14 0459) Pulse Rate:  [84-110] 95 (10/14 0459) Resp:  [17-31] 17 (10/14 0459) BP: (133-170)/(47-90) 133/80 mmHg (10/14 0459) SpO2:  [94 %-98 %] 96 % (10/14 0459) Weight:  [314 lb 11.2 oz (142.747 kg)-314 lb 12.8 oz (142.792 kg)] 314 lb 12.8 oz (142.792 kg) (10/14 0459) Last BM Date: 12/08/14  Weight change: Filed Weights   12/09/14 0500 12/09/14 2015 12/10/14 0459  Weight: 319 lb 12.8 oz (145.06 kg) 314 lb 11.2 oz (142.747 kg) 314 lb 12.8 oz (142.792 kg)    Intake/Output:   Intake/Output Summary (Last 24 hours) at 12/10/14 0945 Last data filed at 12/10/14 0830  Gross per 24 hour  Intake   1175 ml  Output   5900 ml  Net  -4725 ml     Physical Exam: CVP 11-12 General:  Well appearing. No resp difficulty. In bed.  HEENT: normal Neck: supple. JVP -10  . Carotids 2+ bilat; no bruits. No lymphadenopathy or thryomegaly appreciated. Cor: PMI nondisplaced. Irregular rate & rhythm. No rubs, gallops or murmurs. Lungs: clear Abdomen: obese, soft, nontender, nondistended. No hepatosplenomegaly. No bruits or masses. Good bowel sounds. Extremities: no cyanosis, clubbing, rash, R and LLE 1-2edema. RUE PICC Neuro: alert & orientedx3, cranial nerves grossly intact. moves all 4 extremities w/o difficulty. Affect pleasant  Telemetry: A fib 80s   Labs: Basic Metabolic Panel:  Recent Labs Lab 12/07/14 0416 12/08/14 0125 12/08/14 0536  12/09/14 0400 12/10/14 0415  NA 141 140 142 137 134*  K 3.4* 3.6 3.5 3.6 4.0  CL 110 106 102 99* 98*  CO2 25 26 25 30 28   GLUCOSE 92 91 100* 134* 128*  BUN 28* 25* 23* 23* 32*  CREATININE 1.71* 1.75* 1.84* 2.04* 2.40*  CALCIUM 7.9* 8.3* 9.2 8.4* 7.8*  MG  --   --   --  1.6*  --     Liver Function Tests:  Recent Labs Lab 12/06/14 2218  AST 20  ALT 11*  ALKPHOS 115  BILITOT 1.1  PROT 8.6*  ALBUMIN 3.9   No results for input(s): LIPASE, AMYLASE in the last 168 hours. No results for input(s): AMMONIA in the last 168 hours.  CBC:  Recent Labs Lab 12/06/14 2218 12/10/14 0415  WBC 8.2 10.5  NEUTROABS 6.3  --   HGB 11.1* 10.9*  HCT 35.7* 34.3*  MCV 88.4 88.2  PLT 207 191    Cardiac Enzymes:  Recent Labs Lab 12/06/14 2218  TROPONINI 0.04*    BNP: BNP (last 3 results)  Recent Labs  12/06/14 2218  BNP 220.0*    ProBNP (last 3 results) No results for input(s): PROBNP in the last 8760 hours.    Other results:  Imaging: Dg Chest Port 1 View  12/08/2014  CLINICAL DATA:  Post insertion of PICC line. EXAM: PORTABLE CHEST 1 VIEW COMPARISON:  12/06/2014 FINDINGS: Interval placement of right PICC line. The tip is in  the SVC. Mild cardiomegaly and vascular congestion. No overt edema. No confluent opacity or effusion. No acute bony abnormality. IMPRESSION: Right PICC line tip in the SVC. Cardiomegaly, vascular congestion. Electronically Signed   By: Rolm Baptise M.D.   On: 12/08/2014 12:16     Medications:     Scheduled Medications: . allopurinol  100 mg Oral Daily  . amLODipine  5 mg Oral Daily  . antiseptic oral rinse  7 mL Mouth Rinse BID  . apixaban  5 mg Oral BID  . carvedilol  3.125 mg Oral BID WC  . cloNIDine  0.1 mg Oral TID  . colchicine  0.6 mg Oral Daily  . folic acid  1 mg Oral Daily  . hydrALAZINE  100 mg Oral TID  . pantoprazole  40 mg Oral Daily  . potassium chloride SA  40 mEq Oral BID  . sodium chloride  10-40 mL Intracatheter Q12H   . sodium chloride  3 mL Intravenous Q12H    Infusions: . furosemide (LASIX) infusion 5 mg/hr (12/09/14 2258)    PRN Medications: hydrALAZINE, HYDROcodone-acetaminophen, ondansetron **OR** ondansetron (ZOFRAN) IV, sodium chloride   Assessment:    1. A/C Diastolic HF- As above increase lasix drip to 10 mg per hour. Follow BMET closely.  2. A fib controlled rate- Most recent DC-CV 2013 successful but has been back in A fib since 2014. Restart low dose carvedilol. Continue eliquis.  3. HTN, uncontrolled.  4. Obesity   Plan/Discussion:    Renal function trending up. PICC placed. CVP 11-12.  Brisk diuresis noted. Stop lasix drip and start torsemide 40 mg daily tomorrow. Renal function trending up. Creatinine  2.0>2.4 .     BP much improved. Increase  amlodipine 10 mg daily . Continue hydralazine 100 mg tid + clonidine 0.1 mg tid.   Rate controlled. Continue low dose carvedilol and eliquis 5 mg twice daily.   Consult cardiac rehab.   Length of Stay: 3  Amy Clegg NP-C  12/10/2014, 9:45 AM  Advanced Heart Failure Team Pager [phone] (M-F; [place])  Please contact [facility] for night-coverage after hours (4p -7a ) and weekends on [email or web address]  Patient seen and examined with Darrick Grinder, NP. We discussed all aspects of the encounter. I agree with the assessment and plan as stated above.   Weight down 37 pounds but weight still well above previous baseline. Suspect much of this adipose tissue. Renal function getting worse. Stop IV lasix. Switch to po demadex tomorrow. Continue Eliquis for anticoagulation in setting of AF. Possible home on Sunday.   Bensimhon, Daniel,MD 12:15 PM

## 2014-12-11 DIAGNOSIS — I429 Cardiomyopathy, unspecified: Secondary | ICD-10-CM

## 2014-12-11 LAB — BASIC METABOLIC PANEL
Anion gap: 10 (ref 5–15)
BUN: 38 mg/dL — ABNORMAL HIGH (ref 6–20)
CALCIUM: 8 mg/dL — AB (ref 8.9–10.3)
CHLORIDE: 98 mmol/L — AB (ref 101–111)
CO2: 27 mmol/L (ref 22–32)
Creatinine, Ser: 2.65 mg/dL — ABNORMAL HIGH (ref 0.44–1.00)
GFR calc non Af Amer: 21 mL/min — ABNORMAL LOW (ref >60)
GFR, EST AFRICAN AMERICAN: 24 mL/min — AB (ref >60)
GLUCOSE: 100 mg/dL — AB (ref 65–99)
POTASSIUM: 3.7 mmol/L (ref 3.5–5.1)
Sodium: 135 mmol/L (ref 135–145)

## 2014-12-11 NOTE — Progress Notes (Signed)
SUBJECTIVE: Feeling "much better". Says breathing and leg swelling have remarkably improved.     Intake/Output Summary (Last 24 hours) at 12/11/14 1256 Last data filed at 12/11/14 1115  Gross per 24 hour  Intake   1440 ml  Output   2400 ml  Net   -960 ml    Current Facility-Administered Medications  Medication Dose Route Frequency Provider Last Rate Last Dose  . allopurinol (ZYLOPRIM) tablet 100 mg  100 mg Oral Daily Orvan Falconer, MD   100 mg at 12/11/14 0854  . amLODipine (NORVASC) tablet 10 mg  10 mg Oral Daily Amy D Clegg, NP   10 mg at 12/11/14 0854  . antiseptic oral rinse (CPC / CETYLPYRIDINIUM CHLORIDE 0.05%) solution 7 mL  7 mL Mouth Rinse BID Orvan Falconer, MD   7 mL at 12/10/14 2153  . apixaban (ELIQUIS) tablet 5 mg  5 mg Oral BID Orvan Falconer, MD   5 mg at 12/11/14 0854  . carvedilol (COREG) tablet 3.125 mg  3.125 mg Oral BID WC Amy D Clegg, NP   3.125 mg at 12/11/14 0852  . cloNIDine (CATAPRES) tablet 0.1 mg  0.1 mg Oral TID Orvan Falconer, MD   0.1 mg at 12/11/14 0853  . colchicine tablet 0.6 mg  0.6 mg Oral Daily Almyra Deforest, PA   0.6 mg at 12/10/14 1045  . folic acid (FOLVITE) tablet 1 mg  1 mg Oral Daily Orvan Falconer, MD   1 mg at 12/11/14 0853  . hydrALAZINE (APRESOLINE) injection 10 mg  10 mg Intravenous Q4H PRN Orvan Falconer, MD   10 mg at 12/07/14 1948  . hydrALAZINE (APRESOLINE) tablet 100 mg  100 mg Oral TID Erline Hau, MD   100 mg at 12/11/14 0853  . HYDROcodone-acetaminophen (NORCO/VICODIN) 5-325 MG per tablet 1 tablet  1 tablet Oral Q6H PRN Almyra Deforest, PA   1 tablet at 12/11/14 1135  . ondansetron (ZOFRAN) tablet 4 mg  4 mg Oral Q6H PRN Orvan Falconer, MD       Or  . ondansetron [facility]) injection 4 mg  4 mg Intravenous Q6H PRN Orvan Falconer, MD      . pantoprazole (PROTONIX) EC tablet 40 mg  40 mg Oral Daily Orvan Falconer, MD   40 mg at 12/11/14 0854  . sodium chloride 0.9 % injection 10-40 mL  10-40 mL Intracatheter Q12H Jolaine Artist, MD   10 mL at 12/10/14 2154  . sodium  chloride 0.9 % injection 10-40 mL  10-40 mL Intracatheter PRN Jolaine Artist, MD      . sodium chloride 0.9 % injection 3 mL  3 mL Intravenous Q12H Orvan Falconer, MD   3 mL at 12/10/14 2155  . torsemide (DEMADEX) tablet 40 mg  40 mg Oral Daily Amy D Clegg, NP   40 mg at 12/11/14 0853    Filed Vitals:   12/11/14 0311 12/11/14 0600 12/11/14 0852 12/11/14 1132  BP:   144/88 141/69  Pulse:   87   Temp:  98.7 F (37.1 C)    TempSrc:  Oral    Resp:    22  Height:      Weight: 311 lb 8 oz (141.295 kg)     SpO2:        PHYSICAL EXAM General: Well appearing. No resp difficulty. Sitting in chair. HEENT: normal Neck: supple. JVP -10 . No thryomegaly appreciated. Cor: PMI nondisplaced. Regular rate & irregular rhythm. No rubs, gallops  or murmurs. Lungs: clear Abdomen: obese, soft, nontender.  Good bowel sounds. Extremities: no cyanosis, clubbing, rash, R and LLE 1+ edema. Neuro: No gross deficits. Affect pleasant   TELEMETRY: Reviewed telemetry pt in atrial fibrillation with PVC's.  LABS: Basic Metabolic Panel:  Recent Labs  12/09/14 0400 12/10/14 0415 12/11/14 0529  NA 137 134* 135  K 3.6 4.0 3.7  CL 99* 98* 98*  CO2 30 28 27   GLUCOSE 134* 128* 100*  BUN 23* 32* 38*  CREATININE 2.04* 2.40* 2.65*  CALCIUM 8.4* 7.8* 8.0*  MG 1.6*  --   --    Liver Function Tests: No results for input(s): AST, ALT, ALKPHOS, BILITOT, PROT, ALBUMIN in the last 72 hours. No results for input(s): LIPASE, AMYLASE in the last 72 hours. CBC:  Recent Labs  12/10/14 0415  WBC 10.5  HGB 10.9*  HCT 34.3*  MCV 88.2  PLT 191   Cardiac Enzymes: No results for input(s): CKTOTAL, CKMB, CKMBINDEX, TROPONINI in the last 72 hours. BNP: Invalid input(s): POCBNP D-Dimer: No results for input(s): DDIMER in the last 72 hours. Hemoglobin A1C: No results for input(s): HGBA1C in the last 72 hours. Fasting Lipid Panel: No results for input(s): CHOL, HDL, LDLCALC, TRIG, CHOLHDL, LDLDIRECT in the last  72 hours. Thyroid Function Tests: No results for input(s): TSH, T4TOTAL, T3FREE, THYROIDAB in the last 72 hours.  Invalid input(s): FREET3 Anemia Panel: No results for input(s): VITAMINB12, FOLATE, FERRITIN, TIBC, IRON, RETICCTPCT in the last 72 hours.  RADIOLOGY: Dg Chest Port 1 View  12/08/2014  CLINICAL DATA:  Post insertion of PICC line. EXAM: PORTABLE CHEST 1 VIEW COMPARISON:  12/06/2014 FINDINGS: Interval placement of right PICC line. The tip is in the SVC. Mild cardiomegaly and vascular congestion. No overt edema. No confluent opacity or effusion. No acute bony abnormality. IMPRESSION: Right PICC line tip in the SVC. Cardiomegaly, vascular congestion. Electronically Signed   By: Rolm Baptise M.D.   On: 12/08/2014 12:16   Dg Chest Port 1 View  12/06/2014  CLINICAL DATA:  44 year old female shortness of breath and lower extremity edema. Initial encounter. EXAM: PORTABLE CHEST 1 VIEW COMPARISON:  Portable chest 12/24/2011 and earlier. FINDINGS: Portable AP upright view at 2153 hours. Large body habitus. There is cardiomegaly. Other mediastinal contours are within normal limits. Pulmonary vascular congestion appears stable to that in 2013. Increased retrocardiac opacity. No pneumothorax or definite effusion. No other confluent pulmonary opacity. IMPRESSION: Cardiomegaly with pulmonary vascular congestion, no overt edema. No definite effusion. Retrocardiac opacity which could reflect atelectasis or consolidation. Electronically Signed   By: Genevie Ann M.D.   On: 12/06/2014 22:24      ASSESSMENT AND PLAN: 1. Acute on chronic diastolic heart failure: Due to creatinine trending up, switched to oral torsemide by Dr. Haroldine Laws. Has diuresed an additional 1360 cc in last 24 hours. Increased weight from baseline attributed to adipose tissue in large part. BP only mildly elevated today.  2. Atrial fibrillation: Rate controlled. On Coreg for rate control and Eliquis for anticoagulation.  3. Essential  HTN: Mildly elevated today. On amlodipine, clonidine, and hydralazine.  4. Acute on chronic renal insufficiency: IV Lasix switched to oral torsemide. Continue to monitor BMET.  Dispo: Possible discharge to home tomorrow.  Kate Sable, M.D., F.A.C.C.

## 2014-12-11 NOTE — Progress Notes (Signed)
CARDIAC REHAB PHASE I   PRE:  Rate/Rhythm: 72 afib  BP:  Supine:   Sitting: 134/87  Standing:    SaO2: 95% ra  MODE:  Ambulation: 56 ft   POST:  Rate/Rhythem: 87 afib  BP:  Supine:   Sitting: 152/94  Standing:    SaO2: 94% ra  1425-1445  Pt ambulated in hallway x 2 assist, using rolling walker.  Pt unable to complete ambulation due to knee pain from gout.  Pt returned to chair, call light in reach.    [place], [place]

## 2014-12-12 ENCOUNTER — Encounter (HOSPITAL_COMMUNITY): Payer: Self-pay | Admitting: Physician Assistant

## 2014-12-12 LAB — BASIC METABOLIC PANEL
Anion gap: 11 (ref 5–15)
BUN: 43 mg/dL — AB (ref 6–20)
CALCIUM: 8.5 mg/dL — AB (ref 8.9–10.3)
CO2: 28 mmol/L (ref 22–32)
Chloride: 101 mmol/L (ref 101–111)
Creatinine, Ser: 2.54 mg/dL — ABNORMAL HIGH (ref 0.44–1.00)
GFR calc Af Amer: 26 mL/min — ABNORMAL LOW (ref >60)
GFR, EST NON AFRICAN AMERICAN: 22 mL/min — AB (ref >60)
GLUCOSE: 98 mg/dL (ref 65–99)
Potassium: 3.7 mmol/L (ref 3.5–5.1)
Sodium: 140 mmol/L (ref 135–145)

## 2014-12-12 MED ORDER — COLCHICINE 0.6 MG PO TABS: 0.6000 mg | ORAL_TABLET | Freq: Every day | ORAL | Status: DC

## 2014-12-12 MED ORDER — POTASSIUM CHLORIDE CRYS ER 20 MEQ PO TBCR: 20.0000 meq | EXTENDED_RELEASE_TABLET | Freq: Every day | ORAL | Status: DC

## 2014-12-12 MED ORDER — TORSEMIDE 20 MG PO TABS: 40.0000 mg | ORAL_TABLET | Freq: Once | ORAL | Status: DC

## 2014-12-12 NOTE — Discharge Summary (Signed)
Discharge Summary   Patient ID: [name] MRN: [id number], DOB/AGE: May 02, 1970 44 y.o. Admit date: 12/06/2014 D/C date:     12/12/2014  Primary Cardiologist: Dr. Haroldine Laws  Principal Problem:   Acute on chronic diastolic heart failure (HCC) Active Problems:   Accelerated hypertension   Acute gouty arthropathy   Atrial fibrillation with controlled ventricular response (HCC)   Morbid obesity (HCC)   Pulmonary hypertension (HCC)   Chronic diastolic heart failure (HCC)   HTN (hypertension)   CKD (chronic kidney disease) stage 3, GFR 30-59 ml/min    Admission Dates: 12/06/14-12/12/14 Discharge Diagnosis: Acute on chronic diastolic CHF. Discharge weight 310lbs.  HPI: [name] is a 44 y.o. female with a history of chronic atrial fibrillation on eliquis, chronic diastolic CHF, pulmonary HTN, HTN, CKD and morbid obesity who was transferred to [facility] from [facility] for marked volume overload.   She initially presented to [facility] with complaints of SOB and weight gain over the past "few days."   She reported compliance with her medications, but does not weigh herself regularly with a scale. She has had no recent cardiology follow-up.  She was last seen by Dr. Haroldine Laws in the CHF clinic back in 2014, has not had regular visits since that time. When last evaluated her weight was 240 pounds, she was also noted to be in persistent atrial fibrillation. Current weight (rechecked) is 342 pounds on admission. In reviewing her chart, she had similar presentation back in 2013 at which time she underwent evaluation for diastolic heart failure, amyloidosis was excluded based on fat pad and bone marrow biopsies, negative SPEP and UPEP. She had severe pulmonary hypertension documented that time by right heart catheterization, and ultimately diuresed approximately 100 pounds while hospitalized at [facility].  She was transferred to [facility] for further evaluation and treatment by the advanced CHF team.    Hospital Course  1. Acute on chronic diastolic heart failure:  -- She presented with massive volume overload. A PICC line was placed and she was put on a Lasix gtt. She has brisk diuresis. Due to creatinine trending up, she was switched to oral torsemide by Dr. Haroldine Laws on 12/11/14. She has negative I/Os since being switched to PO. She Net neg 23.5L and down 40 lbs (350 --> 310 lbs) since admission.  -- 12/07/2014 ECHO EF 55-60%. LA severely dilated. RV normal RA moderately dilated, Severe LVH. IVC dilated.  -- Increased weight from baseline attributed to adipose tissue in large part. This may be her new baseline (310lbs). -- She will continue torsemide 40mg  po qd and Kdur 102mEq po qd at discharge  . 2. Atrial fibrillation: Rate controlled. On Coreg for rate control and Eliquis for anticoagulation.  3. Essential HTN: stable on amlodipine 10mg , coreg 3.125mg  BID, clonidine 0.1mg  TID, and hydralazine 100mg  TID  4. Acute on chronic renal insufficiency: Creat bumped on IV lasix. She was converted to PO torsemide 40mg  po qd and her creat improved 2.65--> 2.54.   5. Gout- has a hx of this. Patient complained on knee pain. Uric acid elevated. Continue colchicine 0.6mg  qd  6. NSVT- 5-6 beat run of NSVT noted on tele. Aysmptomatic Continue BB  7. Cough- coughing up some yellow sputum but currently not febrile. T max 100.4 yesterday, now 98. Encouraged to call PCP if productive cough becomes worse and/or she gets a fever  Dispo- she has previously schedule outpatient follow up in CHF clinic with Dr. Haroldine Laws this Friday 12/17/14.   The patient has  had an uncomplicated hospital course and is recovering well. She has been seen by Dr. Bronson Ing today and deemed ready for discharge home. All follow-up appointments have been scheduled.  Discharge medications are listed below.   Discharge Vitals: Blood pressure 144/81, pulse 78, temperature 98 F (36.7 C), temperature source Oral, resp. rate  18, height 5\' 10"  (1.778 m), weight 310 lb 1.6 oz (140.66 kg), last menstrual period 11/29/2014, SpO2 98 %.  Labs: Lab Results  Component Value Date   WBC 10.5 12/10/2014   HGB 10.9* 12/10/2014   HCT 34.3* 12/10/2014   MCV 88.2 12/10/2014   PLT 191 12/10/2014    Recent Labs Lab 12/06/14 2218  12/12/14 0349  NA 137  < > 140  K 4.2  < > 3.7  CL 105  < > 101  CO2 22  < > 28  BUN 28*  < > 43*  CREATININE 1.64*  < > 2.54*  CALCIUM 8.0*  < > 8.5*  PROT 8.6*  --   --   BILITOT 1.1  --   --   ALKPHOS 115  --   --   ALT 11*  --   --   AST 20  --   --   GLUCOSE 120*  < > 98  < > = values in this interval not displayed. No results for input(s): CKTOTAL, CKMB, TROPONINI in the last 72 hours. Lab Results  Component Value Date   CHOL 107 12/11/2011   HDL 21* 12/11/2011   LDLCALC 71 12/11/2011   TRIG 77 12/11/2011   No results found for: DDIMER  Diagnostic Studies/Procedures   Dg Chest Port 1 View  12/08/2014  CLINICAL DATA:  Post insertion of PICC line. EXAM: PORTABLE CHEST 1 VIEW COMPARISON:  12/06/2014 FINDINGS: Interval placement of right PICC line. The tip is in the SVC. Mild cardiomegaly and vascular congestion. No overt edema. No confluent opacity or effusion. No acute bony abnormality. IMPRESSION: Right PICC line tip in the SVC. Cardiomegaly, vascular congestion. Electronically Signed   By: Rolm Baptise M.D.   On: 12/08/2014 12:16   Dg Chest Port 1 View  12/06/2014  CLINICAL DATA:  44 year old female shortness of breath and lower extremity edema. Initial encounter. EXAM: PORTABLE CHEST 1 VIEW COMPARISON:  Portable chest 12/24/2011 and earlier. FINDINGS: Portable AP upright view at 2153 hours. Large body habitus. There is cardiomegaly. Other mediastinal contours are within normal limits. Pulmonary vascular congestion appears stable to that in 2013. Increased retrocardiac opacity. No pneumothorax or definite effusion. No other confluent pulmonary opacity. IMPRESSION:  Cardiomegaly with pulmonary vascular congestion, no overt edema. No definite effusion. Retrocardiac opacity which could reflect atelectasis or consolidation. Electronically Signed   By: Genevie Ann M.D.   On: 12/06/2014 22:24      2D ECHO: 12/07/2014 LV EF: 55- 60% Study Conclusions - Left ventricle: The cavity size was mildly dilated. Wall thickness was increased in a pattern of severe LVH. Systolic function was normal. The estimated ejection fraction was in the range of 55% to 60%. Wall motion was normal; there were no regional wall motion abnormalities. The study is not technically sufficient to allow evaluation of LV diastolic function. - Aortic root: The aortic root was mildly ectatic. - Mitral valve: Calcified annulus. There was trivial regurgitation. - Left atrium: The atrium was severely dilated. - Right ventricle: Systolic function was low normal. - Right atrium: The atrium was moderately dilated. Central venous pressure (est): 15 mm Hg. - Atrial septum: A patent  foramen ovale cannot be excluded. - Tricuspid valve: There was mild regurgitation. - Pulmonary arteries: PA peak pressure: 49 mm Hg (S). - Pericardium, extracardiac: A moderate pericardial effusion was identified circumferential to the heart. Impressions: - Mild LV chamber dilatation with severe LVH and LVEF 55-60%. Indeterminate diastolic function in the setting of atrial fibrillation. severe left atrial enlargement. Mild MAC with trivial mitral regurgitation. Mildly ectatic aortic root. Normal RV chamber size with low normal contraction. Moderate right atrial enlargement. Mild tricuspid regurgitation with PASP 49 mmHg and dilated IVC. Moderate, circumferential pericardial effusion. Compared to the prior study from 2013, LVEF is less vigorous but remains normal range. RV size and contraction also have improved, and pulmonary systolic pressure has decreased.  Discharge Medications      Medication List    TAKE these medications        acetaminophen 325 MG tablet  Commonly known as:  TYLENOL  Take 2 tablets (650 mg total) by mouth every 4 (four) hours as needed.     allopurinol 100 MG tablet  Commonly known as:  ZYLOPRIM  Take 100 mg by mouth daily.     amLODipine 10 MG tablet  Commonly known as:  NORVASC  Take 1 tablet (10 mg total) by mouth daily.     carvedilol 3.125 MG tablet  Commonly known as:  COREG  Take 3.125 mg by mouth 2 (two) times daily with a meal.     cloNIDine 0.1 MG tablet  Commonly known as:  CATAPRES  Take 0.1 mg by mouth 3 (three) times daily.     colchicine 0.6 MG tablet  Take 1 tablet (0.6 mg total) by mouth daily.     ELIQUIS 5 MG Tabs tablet  Generic drug:  apixaban  Take 5 mg by mouth 2 (two) times daily.     folic acid 1 MG tablet  Commonly known as:  FOLVITE  Take 1 tablet (1 mg total) by mouth daily.     hydrALAZINE 100 MG tablet  Commonly known as:  APRESOLINE  Take 1 tablet (100 mg total) by mouth 3 (three) times daily. MUST HAVE FOLLOW UP APPOINTMENT FOR FURTHER REFILLS     loratadine 10 MG tablet  Commonly known as:  CLARITIN  Take 10 mg by mouth daily.     pantoprazole 40 MG tablet  Commonly known as:  PROTONIX  Take 1 tablet (40 mg total) by mouth daily.     potassium chloride SA 20 MEQ tablet  Commonly known as:  K-DUR,KLOR-CON  Take 1 tablet (20 mEq total) by mouth daily.     torsemide 20 MG tablet  Commonly known as:  DEMADEX  Take 2 tablets (40 mg total) by mouth once.        Disposition   The patient will be discharged in stable condition to home.  Follow-up Information    Follow up with Glori Bickers, MD On 12/17/2014.   Specialty:  Cardiology   Why:  at 9:20 [place] Code [id number]   Contact information:   [place] [address] [phone]         Duration of Discharge Encounter: Greater than 30 minutes including physician and PA  time.  SignedAngelena Form R PA-C 12/12/2014, 11:45 AM

## 2014-12-12 NOTE — Discharge Instructions (Signed)
Atrial Fibrillation °Atrial fibrillation is a type of irregular or rapid heartbeat (arrhythmia). In atrial fibrillation, the heart quivers continuously in a chaotic pattern. This occurs when parts of the heart receive disorganized signals that make the heart unable to pump blood normally. This can increase the risk for stroke, heart failure, and other heart-related conditions. There are different types of atrial fibrillation, including: °· Paroxysmal atrial fibrillation. This type starts suddenly, and it usually stops on its own shortly after it starts. °· Persistent atrial fibrillation. This type often lasts longer than a week. It may stop on its own or with treatment. °· Long-lasting persistent atrial fibrillation. This type lasts longer than 12 months. °· Permanent atrial fibrillation. This type does not go away. °Talk with your health care provider to learn about the type of atrial fibrillation that you have. °CAUSES °This condition is caused by some heart-related conditions or procedures, including: °· A heart attack. °· Coronary artery disease. °· Heart failure. °· Heart valve conditions. °· High blood pressure. °· Inflammation of the sac that surrounds the heart (pericarditis). °· Heart surgery. °· Certain heart rhythm disorders, such as Wolf-Parkinson-White syndrome. °Other causes include: °· Pneumonia. °· Obstructive sleep apnea. °· Blockage of an artery in the lungs (pulmonary embolism, or PE). °· Lung cancer. °· Chronic lung disease. °· Thyroid problems, especially if the thyroid is overactive (hyperthyroidism). °· Caffeine. °· Excessive alcohol use or illegal drug use. °· Use of some medicines, including certain decongestants and diet pills. °Sometimes, the cause cannot be found. °RISK FACTORS °This condition is more likely to develop in: °· People who are older in age. °· People who smoke. °· People who have diabetes mellitus. °· People who are overweight (obese). °· Athletes who exercise  vigorously. °SYMPTOMS °Symptoms of this condition include: °· A feeling that your heart is beating rapidly or irregularly. °· A feeling of discomfort or pain in your chest. °· Shortness of breath. °· Sudden light-headedness or weakness. °· Getting tired easily during exercise. °In some cases, there are no symptoms. °DIAGNOSIS °Your health care provider may be able to detect atrial fibrillation when taking your pulse. If detected, this condition may be diagnosed with: °· An electrocardiogram (ECG). °· A Holter monitor test that records your heartbeat patterns over a 24-hour period. °· Transthoracic echocardiogram (TTE) to evaluate how blood flows through your heart. °· Transesophageal echocardiogram (TEE) to view more detailed images of your heart. °· A stress test. °· Imaging tests, such as a CT scan or chest X-ray. °· Blood tests. °TREATMENT °The main goals of treatment are to prevent blood clots from forming and to keep your heart beating at a normal rate and rhythm. The type of treatment that you receive depends on many factors, such as your underlying medical conditions and how you feel when you are experiencing atrial fibrillation. °This condition may be treated with: °· Medicine to slow down the heart rate, bring the heart's rhythm back to normal, or prevent clots from forming. °· Electrical cardioversion. This is a procedure that resets your heart's rhythm by delivering a controlled, low-energy shock to the heart through your skin. °· Different types of ablation, such as catheter ablation, catheter ablation with pacemaker, or surgical ablation. These procedures destroy the heart tissues that send abnormal signals. When the pacemaker is used, it is placed under your skin to help your heart beat in a regular rhythm. °HOME CARE INSTRUCTIONS °· Take over-the counter and prescription medicines only as told by your health care provider. °·   If your health care provider prescribed a blood-thinning medicine  (anticoagulant), take it exactly as told. Taking too much blood-thinning medicine can cause bleeding. If you do not take enough blood-thinning medicine, you will not have the protection that you need against stroke and other problems.  Do not use tobacco products, including cigarettes, chewing tobacco, and e-cigarettes. If you need help quitting, ask your health care provider.  If you have obstructive sleep apnea, manage your condition as told by your health care provider.  Do not drink alcohol.  Do not drink beverages that contain caffeine, such as coffee, soda, and tea.  Maintain a healthy weight. Do not use diet pills unless your health care provider approves. Diet pills may make heart problems worse.  Follow diet instructions as told by your health care provider.  Exercise regularly as told by your health care provider.  Keep all follow-up visits as told by your health care provider. This is important. PREVENTION  Avoid drinking beverages that contain caffeine or alcohol.  Avoid certain medicines, especially medicines that are used for breathing problems.  Avoid certain herbs and herbal medicines, such as those that contain ephedra or ginseng.  Do not use illegal drugs, such as cocaine and amphetamines.  Do not smoke.  Manage your high blood pressure. SEEK MEDICAL CARE IF:  You notice a change in the rate, rhythm, or strength of your heartbeat.  You are taking an anticoagulant and you notice increased bruising.  You tire more easily when you exercise or exert yourself. SEEK IMMEDIATE MEDICAL CARE IF:  You have chest pain, abdominal pain, sweating, or weakness.  You feel nauseous.  You notice blood in your vomit, bowel movement, or urine.  You have shortness of breath.  You suddenly have swollen feet and ankles.  You feel dizzy.  You have sudden weakness or numbness of the face, arm, or leg, especially on one side of the body.  You have trouble speaking,  trouble understanding, or both (aphasia).  Your face or your eyelid droops on one side. These symptoms may represent a serious problem that is an emergency. Do not wait to see if the symptoms will go away. Get medical help right away. Call your local emergency services (911 in the [place] Do not drive yourself to the hospital.   This information is not intended to replace advice given to you by your health care provider. Make sure you discuss any questions you have with your health care provider.   Document Released: 02/12/2005 Document Revised: 11/03/2014 Document Reviewed: 06/09/2014 Elsevier Interactive Patient Education 2016 [employer]   Heart Failure Heart failure is a condition in which the heart has trouble pumping blood. This means your heart does not pump blood efficiently for your body to work well. In some cases of heart failure, fluid may back up into your lungs or you may have swelling (edema) in your lower legs. Heart failure is usually a long-term (chronic) condition. It is important for you to take good care of yourself and follow your health care provider's treatment plan. CAUSES  Some health conditions can cause heart failure. Those health conditions include:  High blood pressure (hypertension). Hypertension causes the heart muscle to work harder than normal. When pressure in the blood vessels is high, the heart needs to pump (contract) with more force in order to circulate blood throughout the body. High blood pressure eventually causes the heart to become stiff and weak.  Coronary artery disease (CAD). CAD is the buildup of cholesterol  and fat (plaque) in the arteries of the heart. The blockage in the arteries deprives the heart muscle of oxygen and blood. This can cause chest pain and may lead to a heart attack. High blood pressure can also contribute to CAD.  Heart attack (myocardial infarction). A heart attack occurs when one or more arteries in the heart become blocked.  The loss of oxygen damages the muscle tissue of the heart. When this happens, part of the heart muscle dies. The injured tissue does not contract as well and weakens the heart's ability to pump blood.  Abnormal heart valves. When the heart valves do not open and close properly, it can cause heart failure. This makes the heart muscle pump harder to keep the blood flowing.  Heart muscle disease (cardiomyopathy or myocarditis). Heart muscle disease is damage to the heart muscle from a variety of causes. These can include drug or alcohol abuse, infections, or unknown reasons. These can increase the risk of heart failure.  Lung disease. Lung disease makes the heart work harder because the lungs do not work properly. This can cause a strain on the heart, leading it to fail.  Diabetes. Diabetes increases the risk of heart failure. High blood sugar contributes to high fat (lipid) levels in the blood. Diabetes can also cause slow damage to tiny blood vessels that carry important nutrients to the heart muscle. When the heart does not get enough oxygen and food, it can cause the heart to become weak and stiff. This leads to a heart that does not contract efficiently.  Other conditions can contribute to heart failure. These include abnormal heart rhythms, thyroid problems, and low blood counts (anemia). Certain unhealthy behaviors can increase the risk of heart failure, including:  Being overweight.  Smoking or chewing tobacco.  Eating foods high in fat and cholesterol.  Abusing illicit drugs or alcohol.  Lacking physical activity. SYMPTOMS  Heart failure symptoms may vary and can be hard to detect. Symptoms may include:  Shortness of breath with activity, such as climbing stairs.  Persistent cough.  Swelling of the feet, ankles, legs, or abdomen.  Unexplained weight gain.  Difficulty breathing when lying flat (orthopnea).  Waking from sleep because of the need to sit up and get more  air.  Rapid heartbeat.  Fatigue and loss of energy.  Feeling light-headed, dizzy, or close to fainting.  Loss of appetite.  Nausea.  Increased urination during the night (nocturia). DIAGNOSIS  A diagnosis of heart failure is based on your history, symptoms, physical examination, and diagnostic tests. Diagnostic tests for heart failure may include:  Echocardiography.  Electrocardiography.  Chest X-ray.  Blood tests.  Exercise stress test.  Cardiac angiography.  Radionuclide scans. TREATMENT  Treatment is aimed at managing the symptoms of heart failure. Medicines, behavioral changes, or surgical intervention may be necessary to treat heart failure.  Medicines to help treat heart failure may include:  Angiotensin-converting enzyme (ACE) inhibitors. This type of medicine blocks the effects of a blood protein called angiotensin-converting enzyme. ACE inhibitors relax (dilate) the blood vessels and help lower blood pressure.  Angiotensin receptor blockers (ARBs). This type of medicine blocks the actions of a blood protein called angiotensin. Angiotensin receptor blockers dilate the blood vessels and help lower blood pressure.  Water pills (diuretics). Diuretics cause the kidneys to remove salt and water from the blood. The extra fluid is removed through urination. This loss of extra fluid lowers the volume of blood the heart pumps.  Beta blockers. These prevent  the heart from beating too fast and improve heart muscle strength.  Digitalis. This increases the force of the heartbeat.  Healthy behavior changes include:  Obtaining and maintaining a healthy weight.  Stopping smoking or chewing tobacco.  Eating heart-healthy foods.  Limiting or avoiding alcohol.  Stopping illicit drug use.  Physical activity as directed by your health care provider.  Surgical treatment for heart failure may include:  A procedure to open blocked arteries, repair damaged heart valves, or  remove damaged heart muscle tissue.  A pacemaker to improve heart muscle function and control certain abnormal heart rhythms.  An internal cardioverter defibrillator to treat certain serious abnormal heart rhythms.  A left ventricular assist device (LVAD) to assist the pumping ability of the heart. HOME CARE INSTRUCTIONS   Take medicines only as directed by your health care provider. Medicines are important in reducing the workload of your heart, slowing the progression of heart failure, and improving your symptoms.  Do not stop taking your medicine unless directed by your health care provider.  Do not skip any dose of medicine.  Refill your prescriptions before you run out of medicine. Your medicines are needed every day.  Engage in moderate physical activity if directed by your health care provider. Moderate physical activity can benefit some people. The elderly and people with severe heart failure should consult with a health care provider for physical activity recommendations.  Eat heart-healthy foods. Food choices should be free of trans fat and low in saturated fat, cholesterol, and salt (sodium). Healthy choices include fresh or frozen fruits and vegetables, fish, lean meats, legumes, fat-free or low-fat dairy products, and whole grain or high fiber foods. Talk to a dietitian to learn more about heart-healthy foods.  Limit sodium if directed by your health care provider. Sodium restriction may reduce symptoms of heart failure in some people. Talk to a dietitian to learn more about heart-healthy seasonings.  Use healthy cooking methods. Healthy cooking methods include roasting, grilling, broiling, baking, poaching, steaming, or stir-frying. Talk to a dietitian to learn more about healthy cooking methods.  Limit fluids if directed by your health care provider. Fluid restriction may reduce symptoms of heart failure in some people.  Weigh yourself every day. Daily weights are important  in the early recognition of excess fluid. You should weigh yourself every morning after you urinate and before you eat breakfast. Wear the same amount of clothing each time you weigh yourself. Record your daily weight. Provide your health care provider with your weight record.  Monitor and record your blood pressure if directed by your health care provider.  Check your pulse if directed by your health care provider.  Lose weight if directed by your health care provider. Weight loss may reduce symptoms of heart failure in some people.  Stop smoking or chewing tobacco. Nicotine makes your heart work harder by causing your blood vessels to constrict. Do not use nicotine gum or patches before talking to your health care provider.  Keep all follow-up visits as directed by your health care provider. This is important.  Limit alcohol intake to no more than 1 drink per day for nonpregnant women and 2 drinks per day for men. One drink equals 12 ounces of beer, 5 ounces of wine, or 1 ounces of hard liquor. Drinking more than that is harmful to your heart. Tell your health care provider if you drink alcohol several times a week. Talk with your health care provider about whether alcohol is safe for you.  If your heart has already been damaged by alcohol or you have severe heart failure, drinking alcohol should be stopped completely.  Stop illicit drug use.  Stay up-to-date with immunizations. It is especially important to prevent respiratory infections through current pneumococcal and influenza immunizations.  Manage other health conditions such as hypertension, diabetes, thyroid disease, or abnormal heart rhythms as directed by your health care provider.  Learn to manage stress.  Plan rest periods when fatigued.  Learn strategies to manage high temperatures. If the weather is extremely hot:  Avoid vigorous physical activity.  Use air conditioning or fans or seek a cooler location.  Avoid caffeine  and alcohol.  Wear loose-fitting, lightweight, and light-colored clothing.  Learn strategies to manage cold temperatures. If the weather is extremely cold:  Avoid vigorous physical activity.  Layer clothes.  Wear mittens or gloves, a hat, and a scarf when going outside.  Avoid alcohol.  Obtain ongoing education and support as needed.  Participate in or seek rehabilitation as needed to maintain or improve independence and quality of life. SEEK MEDICAL CARE IF:   You have a rapid weight gain.  You have increasing shortness of breath that is unusual for you.  You are unable to participate in your usual physical activities.  You tire easily.  You cough more than normal, especially with physical activity.  You have any or more swelling in areas such as your hands, feet, ankles, or abdomen.  You are unable to sleep because it is hard to breathe.  You feel like your heart is beating fast (palpitations).  You become dizzy or light-headed upon standing up. SEEK IMMEDIATE MEDICAL CARE IF:   You have difficulty breathing.  There is a change in mental status such as decreased alertness or difficulty with concentration.  You have a pain or discomfort in your chest.  You have an episode of fainting (syncope). MAKE SURE YOU:   Understand these instructions.  Will watch your condition.  Will get help right away if you are not doing well or get worse.   This information is not intended to replace advice given to you by your health care provider. Make sure you discuss any questions you have with your health care provider.   Document Released: 02/12/2005 Document Revised: 06/29/2014 Document Reviewed: 03/14/2012 Elsevier Interactive Patient Education 2016 [employer]  Low-Sodium Eating Plan Sodium raises blood pressure and causes water to be held in the body. Getting less sodium from food will help lower your blood pressure, reduce any swelling, and protect your heart,  liver, and kidneys. We get sodium by adding salt (sodium chloride) to food. Most of our sodium comes from canned, boxed, and frozen foods. Restaurant foods, fast foods, and pizza are also very high in sodium. Even if you take medicine to lower your blood pressure or to reduce fluid in your body, getting less sodium from your food is important. WHAT IS MY PLAN? Most people should limit their sodium intake to 2,300 mg a day. Your health care provider recommends that you limit your sodium intake to __________ a day.  WHAT DO I NEED TO KNOW ABOUT THIS EATING PLAN? For the low-sodium eating plan, you will follow these general guidelines:  Choose foods with a % Daily Value for sodium of less than 5% (as listed on the food label).   Use salt-free seasonings or herbs instead of table salt or sea salt.   Check with your health care provider or pharmacist before using salt substitutes.  Eat fresh foods.  Eat more vegetables and fruits.  Limit canned vegetables. If you do use them, rinse them well to decrease the sodium.   Limit cheese to 1 oz (28 g) per day.   Eat lower-sodium products, often labeled as "lower sodium" or "no salt added."  Avoid foods that contain monosodium glutamate (MSG). MSG is sometimes added to [place] food and some canned foods.  Check food labels (Nutrition Facts labels) on foods to learn how much sodium is in one serving.  Eat more home-cooked food and less restaurant, buffet, and fast food.  When eating at a restaurant, ask that your food be prepared with less salt, or no salt if possible.  HOW DO I READ FOOD LABELS FOR SODIUM INFORMATION? The Nutrition Facts label lists the amount of sodium in one serving of the food. If you eat more than one serving, you must multiply the listed amount of sodium by the number of servings. Food labels may also identify foods as:  Sodium free--Less than 5 mg in a serving.  Very low sodium--35 mg or less in a  serving.  Low sodium--140 mg or less in a serving.  Light in sodium--50% less sodium in a serving. For example, if a food that usually has 300 mg of sodium is changed to become light in sodium, it will have 150 mg of sodium.  Reduced sodium--25% less sodium in a serving. For example, if a food that usually has 400 mg of sodium is changed to reduced sodium, it will have 300 mg of sodium. WHAT FOODS CAN I EAT? Grains Low-sodium cereals, including oats, puffed wheat and rice, and shredded wheat cereals. Low-sodium crackers. Unsalted rice and pasta. Lower-sodium bread.  Vegetables Frozen or fresh vegetables. Low-sodium or reduced-sodium canned vegetables. Low-sodium or reduced-sodium tomato sauce and paste. Low-sodium or reduced-sodium tomato and vegetable juices.  Fruits Fresh, frozen, and canned fruit. Fruit juice.  Meat and Other Protein Products Low-sodium canned tuna and salmon. Fresh or frozen meat, poultry, seafood, and fish. Lamb. Unsalted nuts. Dried beans, peas, and lentils without added salt. Unsalted canned beans. Homemade soups without salt. Eggs.  Dairy Milk. Soy milk. Ricotta cheese. Low-sodium or reduced-sodium cheeses. Yogurt.  Condiments Fresh and dried herbs and spices. Salt-free seasonings. Onion and garlic powders. Low-sodium varieties of mustard and ketchup. Fresh or refrigerated horseradish. Lemon juice.  Fats and Oils Reduced-sodium salad dressings. Unsalted butter.  Other Unsalted popcorn and pretzels.  The items listed above may not be a complete list of recommended foods or beverages. Contact your dietitian for more options. WHAT FOODS ARE NOT RECOMMENDED? Grains Instant hot cereals. Bread stuffing, pancake, and biscuit mixes. Croutons. Seasoned rice or pasta mixes. Noodle soup cups. Boxed or frozen macaroni and cheese. Self-rising flour. Regular salted crackers. Vegetables Regular canned vegetables. Regular canned tomato sauce and paste. Regular tomato  and vegetable juices. Frozen vegetables in sauces. Salted [place] fries. Olives. Angie Fava. Relishes. Sauerkraut. Salsa. Meat and Other Protein Products Salted, canned, smoked, spiced, or pickled meats, seafood, or fish. Bacon, ham, sausage, hot dogs, corned beef, chipped beef, and packaged luncheon meats. Salt pork. Jerky. Pickled herring. Anchovies, regular canned tuna, and sardines. Salted nuts. Dairy Processed cheese and cheese spreads. Cheese curds. Blue cheese and cottage cheese. Buttermilk.  Condiments Onion and garlic salt, seasoned salt, table salt, and sea salt. Canned and packaged gravies. Worcestershire sauce. Tartar sauce. Barbecue sauce. Teriyaki sauce. Soy sauce, including reduced sodium. Steak sauce. Fish sauce. Oyster sauce. Cocktail sauce. Horseradish that you find  on the shelf. Regular ketchup and mustard. Meat flavorings and tenderizers. Bouillon cubes. Hot sauce. Tabasco sauce. Marinades. Taco seasonings. Relishes. Fats and Oils Regular salad dressings. Salted butter. Margarine. Ghee. Bacon fat.  Other Potato and tortilla chips. Corn chips and puffs. Salted popcorn and pretzels. Canned or dried soups. Pizza. Frozen entrees and pot pies.  The items listed above may not be a complete list of foods and beverages to avoid. Contact your dietitian for more information.   This information is not intended to replace advice given to you by your health care provider. Make sure you discuss any questions you have with your health care provider.   Document Released: 08/04/2001 Document Revised: 03/05/2014 Document Reviewed: 12/17/2012 Elsevier Interactive Patient Education [employer].

## 2014-12-12 NOTE — Progress Notes (Signed)
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 Has cough and knee pain. Coughed so much yesterday that her side hurt. Sputum white.   CURRENT MEDS . allopurinol  100 mg Oral Daily  . amLODipine  10 mg Oral Daily  . antiseptic oral rinse  7 mL Mouth Rinse BID  . apixaban  5 mg Oral BID  . carvedilol  3.125 mg Oral BID WC  . cloNIDine  0.1 mg Oral TID  . colchicine  0.6 mg Oral Daily  . folic acid  1 mg Oral Daily  . hydrALAZINE  100 mg Oral TID  . pantoprazole  40 mg Oral Daily  . sodium chloride  10-40 mL Intracatheter Q12H  . sodium chloride  3 mL Intravenous Q12H  . torsemide  40 mg Oral Daily    OBJECTIVE  Filed Vitals:   12/11/14 1803 12/11/14 2000 12/11/14 2314 12/12/14 0500  BP: 118/67 110/55 124/68 117/103  Pulse: 78 83 87 88  Temp:  99.3 F (37.4 C) 99.1 F (37.3 C) 100.4 F (38 C)  TempSrc:      Resp:  18 19 20   Height:      Weight:    310 lb 1.6 oz (140.66 kg)  SpO2:  98% 97% 98%    Intake/Output Summary (Last 24 hours) at 12/12/14 0716 Last data filed at 12/12/14 0541  Gross per 24 hour  Intake    720 ml  Output   2250 ml  Net  -1530 ml   Filed Weights   12/10/14 0955 12/11/14 0311 12/12/14 0500  Weight: 313 lb 3.2 oz (142.067 kg) 311 lb 8 oz (141.295 kg) 310 lb 1.6 oz (140.66 kg)    PHYSICAL EXAM  General: Pleasant, NAD. obese Neuro: Alert and oriented X 3. Moves all extremities spontaneously. Psych: Normal affect. HEENT:  Normal  Neck: Supple without bruits or No JVD. Lungs:  Resp regular and unlabored,  CTA. Heart: irreg irreg. no s3, s4, or murmurs. Abdomen: Soft, non-tender, non-distended, BS + x 4.  Extremities: No clubbing, cyanosis. DP/PT/Radials 2+ and equal bilaterally. 1+ LE edema bilaterally  Accessory Clinical Findings  CBC  Recent Labs  12/10/14 0415  WBC 10.5  HGB 10.9*  HCT 34.3*  MCV 88.2  PLT 203   Basic Metabolic Panel  Recent Labs  12/11/14 0529 12/12/14 0349  NA 135 140  K 3.7 3.7  CL 98* 101  CO2 27 28  GLUCOSE 100* 98  BUN 38* 43*  CREATININE 2.65* 2.54*  CALCIUM 8.0* 8.5*    TELE  afib with rate control. Freq PVCs and one run of NSVT  Radiology/Studies  Dg Chest Port 1 View  12/08/2014  CLINICAL DATA:  Post insertion of PICC line. EXAM: PORTABLE CHEST 1 VIEW COMPARISON:  12/06/2014 FINDINGS: Interval placement of right PICC line. The tip is in the SVC. Mild cardiomegaly and vascular congestion. No overt edema. No confluent opacity or effusion. No acute bony abnormality. IMPRESSION: Right PICC line tip in the SVC. Cardiomegaly, vascular congestion. Electronically Signed   By: Rolm Baptise M.D.  On: 12/08/2014 12:16   Dg Chest Port 1 View  12/06/2014  CLINICAL DATA:  44 year old female shortness of breath and lower extremity edema. Initial encounter. EXAM: PORTABLE CHEST 1 VIEW COMPARISON:  Portable chest 12/24/2011 and earlier. FINDINGS: Portable AP upright view at 2153 hours. Large body habitus. There is cardiomegaly. Other mediastinal contours are within normal limits. Pulmonary vascular congestion appears stable to that in 2013. Increased retrocardiac opacity. No pneumothorax or definite effusion. No other confluent pulmonary opacity. IMPRESSION: Cardiomegaly with pulmonary vascular congestion, no overt edema. No definite effusion. Retrocardiac opacity which could reflect atelectasis or consolidation. Electronically Signed   By: Genevie Ann M.D.   On: 12/06/2014 22:24    ASSESSMENT AND PLAN  [name] is a 44 y.o. female with a history of  chronic atrial fibrillation on eliquis, chronic diastolic CHF, pulmonary HTN, HTN, CKD and morbid obesity who was transferred to [facility] from [facility] for marked volume overload.   1. Acute on chronic diastolic heart failure: Due to creatinine trending up, switched to oral torsemide by Dr. Haroldine Laws on 12/11/14. She has negative I/Os since being switched to PO. Net neg 23.5L. Weight down 350 --> 310 lbs.   -- 12/07/2014 ECHO EF 55-60%. LA severely dilated. RV normal RA moderately dilated, Severe LVH. IVC dilated.  -- Increased weight from baseline attributed to adipose tissue in large part. This may be her new baseline.  . 2. Atrial fibrillation: Rate controlled. On Coreg for rate control and Eliquis for anticoagulation.  3. Essential HTN: still remains mildly elevated. On amlodipine, clonidine, and hydralazine.  4. Acute on chronic renal insufficiency: IV Lasix switched to oral torsemide. Creat improved today 2.65--> 2.54.   5. Gout- patient complained on knee pain. Uric acid elevated. Continue colchicine 0.6mg  qd  6. NSVT- 5-6 beat run of NSVT noted on tele. Aysmptomatic Continue BB  Dispo- possibly home today with close follow up in the CHF clinic. SHe lives in [place] but wants to follow up with Dr. Haroldine Laws   Signed, [place] Silos [facility] R PA-C  Pager [phone]  The patient was seen and examined, and I agree with the assessment and plan as documented above. Patient feels much better with respect to breathing. Still has some leg swelling but this has significantly improved. Coughing up some yellow sputum but currently not febrile. T max 100.4 yesterday, now 98. Encouraged to call PCP if productive cough becomes worse and/or she gets a fever. Will discharge on torsemide 40 mg daily. Creatinine trending down today. Will make certain she has close follow up in the advanced heart failure clinic with Dr. Haroldine Laws.

## 2014-12-17 ENCOUNTER — Ambulatory Visit (HOSPITAL_COMMUNITY)
Admission: RE | Admit: 2014-12-17 | Discharge: 2014-12-17 | Disposition: A | Discharge status: Home / Self Care | Payer: Medicare Other | Source: Ambulatory Visit | Attending: Adult Health | Admitting: Adult Health

## 2014-12-17 ENCOUNTER — Ambulatory Visit (HOSPITAL_COMMUNITY)
Admission: RE | Admit: 2014-12-17 | Discharge: 2014-12-17 | Disposition: A | Discharge status: Home / Self Care | Payer: Medicare Other | Source: Ambulatory Visit | Attending: Internal Medicine | Admitting: Internal Medicine

## 2014-12-17 VITALS — BP 170/90 | HR 96 | Ht 70.0 in | Wt 313.8 lb

## 2014-12-17 DIAGNOSIS — J209 Acute bronchitis, unspecified: Secondary | ICD-10-CM

## 2014-12-17 DIAGNOSIS — J810 Acute pulmonary edema: Secondary | ICD-10-CM | POA: Diagnosis not present

## 2014-12-17 DIAGNOSIS — I272 Other secondary pulmonary hypertension: Secondary | ICD-10-CM | POA: Diagnosis not present

## 2014-12-17 DIAGNOSIS — J40 Bronchitis, not specified as acute or chronic: Secondary | ICD-10-CM

## 2014-12-17 DIAGNOSIS — I5032 Chronic diastolic (congestive) heart failure: Secondary | ICD-10-CM

## 2014-12-17 DIAGNOSIS — I509 Heart failure, unspecified: Secondary | ICD-10-CM | POA: Insufficient documentation

## 2014-12-17 LAB — CBC
HEMATOCRIT: 38.1 % (ref 36.0–46.0)
Hemoglobin: 11.7 g/dL — ABNORMAL LOW (ref 12.0–15.0)
MCH: 27.2 pg (ref 26.0–34.0)
MCHC: 30.7 g/dL (ref 30.0–36.0)
MCV: 88.6 fL (ref 78.0–100.0)
PLATELETS: 293 10*3/uL (ref 150–400)
RBC: 4.3 MIL/uL (ref 3.87–5.11)
RDW: 16 % — AB (ref 11.5–15.5)
WBC: 7.9 10*3/uL (ref 4.0–10.5)

## 2014-12-17 LAB — BASIC METABOLIC PANEL
Anion gap: 12 (ref 5–15)
BUN: 32 mg/dL — AB (ref 6–20)
CO2: 27 mmol/L (ref 22–32)
CREATININE: 1.72 mg/dL — AB (ref 0.44–1.00)
Calcium: 9.1 mg/dL (ref 8.9–10.3)
Chloride: 100 mmol/L — ABNORMAL LOW (ref 101–111)
GFR, EST AFRICAN AMERICAN: 41 mL/min — AB (ref >60)
GFR, EST NON AFRICAN AMERICAN: 36 mL/min — AB (ref >60)
Glucose, Bld: 89 mg/dL (ref 65–99)
POTASSIUM: 3.7 mmol/L (ref 3.5–5.1)
SODIUM: 139 mmol/L (ref 135–145)

## 2014-12-17 MED ORDER — CLONIDINE HCL 0.2 MG PO TABS: 0.2000 mg | ORAL_TABLET | Freq: Three times a day (TID) | ORAL | Status: DC

## 2014-12-17 MED ORDER — TORSEMIDE 20 MG PO TABS: 40.0000 mg | ORAL_TABLET | Freq: Once | ORAL | Status: DC

## 2014-12-17 MED ORDER — DOXYCYCLINE HYCLATE 50 MG PO CAPS: 100.0000 mg | ORAL_CAPSULE | Freq: Two times a day (BID) | ORAL | Status: DC

## 2014-12-17 NOTE — Progress Notes (Signed)
Patient ID: [name], female   DOB: 08/31/1970, 44 y.o.   MRN: [id number] PCP: Dr Berdine Addison  HPI: [name] is a 44 yo AAF with history of HTN, gout, morbid obesity, chronic atrial fibrillation -on eliquis (had DC-CV 27/03/50), and diastolic heart failure.   10/30/79 Echo: diastolic heart failure with ejection fraction of 55-60%, severe LVH, Mildly dilated RV. Mildly to mod dilated RA. moderate pericardial effusion and echocardiographic changes consistent with a possible infiltrative disease. PAPP 72 mmHg   Milrinone stopped 10/30 after RHC c/w high output HF.  RA = 16  RV = 81/14/20  PA = 78/32 (52)  PCW = 21  Fick cardiac output/index = 11.9/4.6  Thermo CO/CI = 9.8/3.8  PVR = 2.6 Woods (Fick)  FA sat = 91%  PA sat = 65%, 72%  SVC sat = 73%  RA sat = 66%  Admitted to [facility] on 12/10/11 for massive volume overload. Transferred to [facility] on 10/26 due to worsening renal function, weakness and volume overload. SPEP/UPEP/fat pad biopsy/bone marrow negative for amyloid. Echo findings thought to be combination of severe HTN and PAH related to obesity-hypoventilation syndrome. Bubble study +. Suspect opening of PFO in setting of PAH. Diuresed ~100 pounds while in house. Cr seems to be new baseline ~2.5. Discharged from [facility] 01/04/12 to [place]. Discharged from [facility]  01/25/12. D/C  weight was 229 pounds.   Admitted to [facility] from [facility] with marked volume overload. Diuresed with lasix drip and transitioned to torsemide 40 mg daily. Discharge weight 310 pounds.    Post hospital follow up.  Overall feeling run down. Productive cough. Yellow sputum. No fever or chills. Denies SOB/PND/Orthopnea. Chronically sleeps on  2 pillows. Weight at home 311-312 pounds.  Taking all medications but did not take torsemide this morning. Ongoing leg edema.      ROS: All systems negative except as listed in HPI, PMH and Problem List.  Past Medical History  Diagnosis Date  . Essential hypertension, benign    . Atrial flutter ([place])   . Gout   . History of cardiac catheterization     a. ectatic coronaries without obstruction 2006 - [place]  . Morbid obesity ([place])   . Hypertensive heart disease   . CKD (chronic kidney disease) stage 3, GFR 30-59 ml/min   . Anemia   . Persistent atrial fibrillation ([place])   . Chronic diastolic heart failure ([place])     a. Amyloidosis work-up negative 2013  b. 12/07/2014 ECHO EF 55-60%. LA severely dilated. RV normal RA moderately dilated, Severe LVH.IVC dilated.   . Pulmonary hypertension ([place] [address])     Current Outpatient Prescriptions  Medication Sig Dispense Refill  . acetaminophen (TYLENOL) 325 MG tablet Take 2 tablets (650 mg total) by mouth every 4 (four) hours as needed. 30 tablet 0  . allopurinol (ZYLOPRIM) 100 MG tablet Take 100 mg by mouth daily.     Marland Kitchen amLODipine (NORVASC) 10 MG tablet Take 1 tablet (10 mg total) by mouth daily. 30 tablet 0  . apixaban (ELIQUIS) 5 MG TABS tablet Take 5 mg by mouth 2 (two) times daily.    . carvedilol (COREG) 3.125 MG tablet Take 3.125 mg by mouth 2 (two) times daily with a meal.    . cloNIDine (CATAPRES) 0.1 MG tablet Take 0.1 mg by mouth 3 (three) times daily.    . folic acid (FOLVITE) 1 MG tablet Take 1 tablet (1 mg total) by mouth daily. 30 tablet 0  . hydrALAZINE (APRESOLINE) 100  MG tablet Take 1 tablet (100 mg total) by mouth 3 (three) times daily. MUST HAVE FOLLOW UP APPOINTMENT FOR FURTHER REFILLS 90 tablet 1  . loratadine (CLARITIN) 10 MG tablet Take 10 mg by mouth daily.    . pantoprazole (PROTONIX) 40 MG tablet Take 1 tablet (40 mg total) by mouth daily. 30 tablet 0  . potassium chloride SA (K-DUR,KLOR-CON) 20 MEQ tablet Take 1 tablet (20 mEq total) by mouth daily. 30 tablet 3  . torsemide (DEMADEX) 20 MG tablet Take 2 tablets (40 mg total) by mouth once. 60 tablet 3  . colchicine 0.6 MG tablet Take 1 tablet (0.6 mg total) by mouth daily. (Patient not taking: Reported on 12/17/2014) 30 tablet 0   No current  facility-administered medications for this encounter.    Filed Vitals:   12/17/14 0923  BP: 170/90  Pulse: 96  Height: 5\' 10"  (1.778 m)  Weight: 313 lb 12.8 oz (142.339 kg)  SpO2: 97%    PHYSICAL EXAM: General: Well appearing. No resp difficulty.  HEENT: normal, keloid L neck  Neck: supple. JVP 9-10  Carotids 2+ bilat; no bruits. No lymphadenopathy or thryomegaly appreciated.  Cor: PMI nondisplaced. Irregular. No rubs, gallops or murmurs.  Lungs: clear  Abdomen: obese, soft, nontender, nondistended. Good bowel sounds.  Extremities: no cyanosis, clubbing, rash,  RLE /LLE  Large gouty nodule R elbow and digits . R and LLE 1+ edema.  Neuro: alert & orientedx3, cranial nerves grossly intact. moves all 4 extremities w/o difficulty. Affect pleasant   ASSESSMENT & PLAN:  1) Chronic diastolic HF - NYHA II. Volume status mildly elevated but has not had torsemide today.  Continue current does of torsemide. She will take an extra 20 mg torsemide for weight 315 or greater.  Check BMET today. - Reinforced the need and importance of daily weights, a low sodium diet, and fluid restriction (less than 2 L a day). Instructed to call the HF clinic if weight increases more than 3 lbs overnight or 5 lbs in a week.  - Will get BMET today to assess renal function. 2) Afib- chronic -Had DC-CV 12/2011 and 06/2012 but back into A fib. Rate controlled. -Continue eliquis 5 mg BID 3) HTN - SBP elevated. Continue hydralazine to 100 mg TID and norvasc 10 mg daily. Increase clonidine up to 0.2 mg three times a day.   4) Acute Bronchitis- Start doxycycline 100 mg twice a day. CXR reviewed with Dr Haroldine Laws - no acute infiltrate - with recommendations to start doxycycline. Check CBC  Follow up 4 weeks.   Amy Clegg NP-C 9:33 AM   Patient seen and examined with Darrick Grinder, NP. We discussed all aspects of the encounter. I agree with the assessment and plan as stated above.   She remains somewhat volume  overloaded. Continue torsemide. Has productive sputum. CXR ok. Appears to have acute bronchitis. Will treat with doxy x 1 week. Continue to follow closely.   Nettye Flegal,MD 11:46 AM

## 2014-12-17 NOTE — Patient Instructions (Signed)
ADD additional 20 mg of Torsemide for weight great than 315 lbs START Doxycycline 100 mg, twice a day INCREASE Clonidine to 0.2 mg, one tab three times per day  Labs today  Your physician recommends that you schedule a follow-up appointment in: 4 weeks with Amy Clegg,NP  Do the following things EVERYDAY: 1) Weigh yourself in the morning before breakfast. Write it down and keep it in a log. 2) Take your medicines as prescribed 3) Eat low salt foods-Limit salt (sodium) to 2000 mg per day.  4) Stay as active as you can everyday 5) Limit all fluids for the day to less than 2 liters 6)

## 2015-01-13 ENCOUNTER — Encounter (HOSPITAL_COMMUNITY): Payer: Medicare Other

## 2015-01-25 ENCOUNTER — Encounter (HOSPITAL_COMMUNITY): Payer: Medicare Other

## 2015-06-08 ENCOUNTER — Other Ambulatory Visit (HOSPITAL_COMMUNITY): Payer: Self-pay | Admitting: *Deleted

## 2015-06-08 DIAGNOSIS — I272 Pulmonary hypertension, unspecified: Secondary | ICD-10-CM

## 2015-06-08 MED ORDER — HYDRALAZINE HCL 100 MG PO TABS: 100.0000 mg | ORAL_TABLET | Freq: Three times a day (TID) | ORAL | Status: DC

## 2015-06-08 MED ORDER — CARVEDILOL 3.125 MG PO TABS: 3.1250 mg | ORAL_TABLET | Freq: Two times a day (BID) | ORAL | Status: DC

## 2015-06-08 MED ORDER — CLONIDINE HCL 0.2 MG PO TABS: 0.2000 mg | ORAL_TABLET | Freq: Three times a day (TID) | ORAL | Status: DC

## 2015-09-27 ENCOUNTER — Ambulatory Visit (HOSPITAL_COMMUNITY)
Admission: RE | Admit: 2015-09-27 | Discharge: 2015-09-27 | Disposition: A | Discharge status: Home / Self Care | Payer: Medicare Other | Source: Ambulatory Visit | Attending: Cardiology | Admitting: Cardiology

## 2015-09-27 VITALS — BP 214/139 | HR 78 | Wt 329.0 lb

## 2015-09-27 DIAGNOSIS — Z6841 Body Mass Index (BMI) 40.0 and over, adult: Secondary | ICD-10-CM | POA: Diagnosis not present

## 2015-09-27 DIAGNOSIS — I130 Hypertensive heart and chronic kidney disease with heart failure and stage 1 through stage 4 chronic kidney disease, or unspecified chronic kidney disease: Secondary | ICD-10-CM | POA: Insufficient documentation

## 2015-09-27 DIAGNOSIS — I159 Secondary hypertension, unspecified: Secondary | ICD-10-CM

## 2015-09-27 DIAGNOSIS — I4891 Unspecified atrial fibrillation: Secondary | ICD-10-CM

## 2015-09-27 DIAGNOSIS — Z79899 Other long term (current) drug therapy: Secondary | ICD-10-CM | POA: Diagnosis not present

## 2015-09-27 DIAGNOSIS — I272 Other secondary pulmonary hypertension: Secondary | ICD-10-CM | POA: Insufficient documentation

## 2015-09-27 DIAGNOSIS — J208 Acute bronchitis due to other specified organisms: Secondary | ICD-10-CM

## 2015-09-27 DIAGNOSIS — Z7901 Long term (current) use of anticoagulants: Secondary | ICD-10-CM | POA: Insufficient documentation

## 2015-09-27 DIAGNOSIS — J209 Acute bronchitis, unspecified: Secondary | ICD-10-CM | POA: Diagnosis not present

## 2015-09-27 DIAGNOSIS — I5032 Chronic diastolic (congestive) heart failure: Secondary | ICD-10-CM | POA: Insufficient documentation

## 2015-09-27 DIAGNOSIS — M109 Gout, unspecified: Secondary | ICD-10-CM | POA: Insufficient documentation

## 2015-09-27 DIAGNOSIS — I482 Chronic atrial fibrillation: Secondary | ICD-10-CM | POA: Insufficient documentation

## 2015-09-27 DIAGNOSIS — N183 Chronic kidney disease, stage 3 (moderate): Secondary | ICD-10-CM | POA: Insufficient documentation

## 2015-09-27 LAB — BASIC METABOLIC PANEL
Anion gap: 8 (ref 5–15)
BUN: 32 mg/dL — AB (ref 6–20)
CHLORIDE: 111 mmol/L (ref 101–111)
CO2: 22 mmol/L (ref 22–32)
Calcium: 9.1 mg/dL (ref 8.9–10.3)
Creatinine, Ser: 1.93 mg/dL — ABNORMAL HIGH (ref 0.44–1.00)
GFR calc Af Amer: 36 mL/min — ABNORMAL LOW (ref >60)
GFR, EST NON AFRICAN AMERICAN: 31 mL/min — AB (ref >60)
GLUCOSE: 92 mg/dL (ref 65–99)
POTASSIUM: 3.9 mmol/L (ref 3.5–5.1)
Sodium: 141 mmol/L (ref 135–145)

## 2015-09-27 LAB — CBC
HEMATOCRIT: 38.7 % (ref 36.0–46.0)
Hemoglobin: 12.2 g/dL (ref 12.0–15.0)
MCH: 29.2 pg (ref 26.0–34.0)
MCHC: 31.5 g/dL (ref 30.0–36.0)
MCV: 92.6 fL (ref 78.0–100.0)
Platelets: 185 10*3/uL (ref 150–400)
RBC: 4.18 MIL/uL (ref 3.87–5.11)
RDW: 14.1 % (ref 11.5–15.5)
WBC: 8.1 10*3/uL (ref 4.0–10.5)

## 2015-09-27 MED ORDER — CARVEDILOL 6.25 MG PO TABS: 6.2500 mg | ORAL_TABLET | Freq: Two times a day (BID) | ORAL | 6 refills | Status: DC

## 2015-09-27 MED ORDER — CLONIDINE HCL 0.2 MG PO TABS: 0.4000 mg | ORAL_TABLET | Freq: Three times a day (TID) | ORAL | 6 refills | Status: DC

## 2015-09-27 MED ORDER — DOXYCYCLINE HYCLATE 50 MG PO CAPS: 100.0000 mg | ORAL_CAPSULE | Freq: Two times a day (BID) | ORAL | 0 refills | Status: AC

## 2015-09-27 NOTE — Patient Instructions (Signed)
INCREASE Clonidine to 0.4 mg ( 2 tabs) three times per day INCREASE Torsemide to 60 mg daily for 3 days, then resume 40 mg daily INCREASE Coreg to 6.25 mg, one tab twice a day START Doxycycline 100 mg, twice a day for 7 days  Be sure to follow up with your PCP ASAP Your physician has requested that you regularly monitor and record your blood pressure readings at home. Please use the same machine at the same time of day to check your readings and record them to bring to your follow-up visit.  Your physician recommends that you schedule a follow-up appointment in: 2 weeks for a bp check Your physician recommends that you schedule a follow-up appointment in: 1 month with the NP/PA

## 2015-09-27 NOTE — Progress Notes (Signed)
Patient ID: [name], female   DOB: [birth year], 45 y.o.   MRN: [id number] PCP: Dr [name]  HPI: [name] is a 45 yo AAF with history of HTN, gout, morbid obesity, chronic atrial fibrillation -on eliquis (had DC-CV 99991111), and diastolic heart failure.   [id number] Echo: diastolic heart failure with ejection fraction of 55-60%, severe LVH, Mildly dilated RV. Mildly to mod dilated RA. moderate pericardial effusion and echocardiographic changes consistent with a possible infiltrative disease. PAPP 72 mmHg   Milrinone stopped 10/30 after RHC c/w high output HF.  RA = 16  RV = 81/14/20  PA = 78/32 (52)  PCW = 21  Fick cardiac output/index = 11.9/4.6  Thermo CO/CI = 9.8/3.8  PVR = 2.6 Woods (Fick)  FA sat = 91%  PA sat = 65%, 72%  SVC sat = 73%  RA sat = 66%  Admitted to [facility] on 12/10/11 for massive volume overload. Transferred to [facility] on 10/26 due to worsening renal function, weakness and volume overload. SPEP/UPEP/fat pad biopsy/bone marrow negative for amyloid. Echo findings thought to be combination of severe HTN and PAH related to obesity-hypoventilation syndrome. Bubble study +. Suspect opening of PFO in setting of PAH. Diuresed ~100 pounds while in house. Cr seems to be new baseline ~2.5. Discharged from [facility] 01/04/12 to [place]. Discharged from [facility]  01/25/12. D/C  weight was 229 pounds.   Admitted to [facility] from [facility] with marked volume overload. Diuresed with lasix drip and transitioned to torsemide 40 mg daily. Discharge weight 310 pounds.    [name] Last visit clonidine was increased to 0.2 three times a day. Overall feeling ok. Complaining of cough. Productive cough with yellow sputum. Mild dyspnea with exertion and steps. Denies PND/Orthopnea. Weight at home trending up 325-330 pounds. She has not been taking extra torsemide. Has not been following low diet and drinking > 2 liters. Taking all medications. Not  Exercising. [name]    ROS: All systems negative except as listed in HPI, PMH and Problem List.  Past Medical History:  Diagnosis Date  . Anemia   . Atrial flutter ([place])   . Chronic diastolic heart failure ([place])    a. Amyloidosis work-up negative 2013  b. 12/07/2014 ECHO EF 55-60%. LA severely dilated. RV normal RA moderately dilated, Severe LVH.IVC dilated.   . CKD (chronic kidney disease) stage 3, GFR 30-59 ml/min   . Essential hypertension, benign   . Gout   . History of cardiac catheterization    a. ectatic coronaries without obstruction 2006 - [place]  . Hypertensive heart disease   . Morbid obesity ([place])   . Persistent atrial fibrillation ([place])   . Pulmonary hypertension ([place]     Current Outpatient Prescriptions  Medication Sig Dispense Refill  . acetaminophen (TYLENOL) 325 MG tablet Take 2 tablets (650 mg total) by mouth every 4 (four) hours as needed. 30 tablet 0  . allopurinol (ZYLOPRIM) 100 MG tablet Take 100 mg by mouth daily.     Marland Kitchen amLODipine (NORVASC) 10 MG tablet Take 1 tablet (10 mg total) by mouth daily. 30 tablet 0  . apixaban (ELIQUIS) 5 MG TABS tablet Take 5 mg by mouth 2 (two) times daily.    . carvedilol (COREG) 3.125 MG tablet Take 1 tablet (3.125 mg total) by mouth 2 (two) times daily with a meal. 60 tablet 3  . cloNIDine (CATAPRES) 0.2 MG tablet Take 1 tablet (0.2 mg total)  by mouth 3 (three) times daily. 90 tablet 6  . colchicine 0.6 MG tablet Take 0.6 mg by mouth daily as needed (gouty flare).    . folic acid (FOLVITE) 1 MG tablet Take 1 tablet (1 mg total) by mouth daily. 30 tablet 0  . hydrALAZINE (APRESOLINE) 100 MG tablet Take 1 tablet (100 mg total) by mouth 3 (three) times daily. MUST HAVE FOLLOW UP APPOINTMENT FOR FURTHER REFILLS 90 tablet 1  . loratadine (CLARITIN) 10 MG tablet Take 10 mg by mouth daily.    . pantoprazole (PROTONIX) 40 MG tablet Take 1 tablet (40 mg total) by mouth daily. 30 tablet 0  . potassium chloride SA (K-DUR,KLOR-CON) 20 MEQ tablet Take 1  tablet (20 mEq total) by mouth daily. 30 tablet 3  . torsemide (DEMADEX) 20 MG tablet Take 40 mg by mouth daily. Take an additional 20 mg (1 tablet) in the afternoon for swelling in the legs or shortness of breath     No current facility-administered medications for this encounter.     Vitals:   09/27/15 0917  BP: (!) 214/139  BP Location: Right Arm  Patient Position: Sitting  Cuff Size: Large  Pulse: 78  SpO2: 92%  Weight: (!) 329 lb (149.2 kg)    PHYSICAL EXAM: General: Well appearing. No resp difficulty.  HEENT: normal, keloid L neck  Neck: supple. JVP ~10  Carotids 2+ bilat; no bruits. No lymphadenopathy or thryomegaly appreciated.  Cor: PMI nondisplaced. Irregular. No rubs, gallops or murmurs.  Lungs: RML RLL LLL Rhonchi  Abdomen: obese, soft, nontender, nondistended. Good bowel sounds.  Extremities: no cyanosis, clubbing, rash,  RLE /LLE  Large gouty nodule R elbow and digits . R and LLE 1+ edema.  Neuro: alert & orientedx3, cranial nerves grossly intact. moves all 4 extremities w/o difficulty. Affect pleasant  EKG: A fib 79 BPM   ASSESSMENT & PLAN:  1) Chronic diastolic HF- ECHO [id number] EF 55-60% - NYHA II-III. Volume status elevated but has not had torsemide today. Increase torsemide 60 mg daily for 3 day then back to 40 mg daily with extra 20 mg torsemide for weight 315 or greater.  Check BMET today. - Reinforced the need and importance of daily weights, a low sodium diet, and fluid restriction (less than 2 L a day). Instructed to call the HF clinic if weight increases more than 3 lbs overnight or 5 lbs in a week.  2) Afib- chronic -Had DC-CV 12/2011 and 06/2012 but back into A fib. Rate controlled. -Continue eliquis 5 mg BID . CBC today  3) HTN - SBP elevated. Continue hydralazine to 100 mg TID and norvasc 10 mg daily. Increase clonidine to 0.4 mg three times a day.  Increase carvedilol to 6.25 mg twice a day.  Instructed to take daily BP and record. Discussed life  style modifications such as weight loss and low salt diet.  4) CKD Stage III-  Creatinine baseline 2.5. BMET today  5) Acute Bronchitis- Give 7 day course of doxycycline 100 mg twice a day  6) Obesity- Needs t lose weight. Discussed portion control.I have asked her to start walking 30 minutes daily.    Follow up in 2 weeks for BP check. Follow up 4 weeks.   Amy Clegg NP-C 9:19 AM

## 2015-09-27 NOTE — Progress Notes (Signed)
Advanced Heart Failure Medication Review by a Pharmacist  Does the patient  feel that his/her medications are working for him/her?  yes  Has the patient been experiencing any side effects to the medications prescribed?  no  Does the patient measure his/her own blood pressure or blood glucose at home?  no   Does the patient have any problems obtaining medications due to transportation or finances?   no  Understanding of regimen: good Understanding of indications: good Potential of compliance: good Patient understands to avoid NSAIDs. Patient understands to avoid decongestants.  Issues to address at subsequent visits: None   Pharmacist comments:  Ms. Kroese is a pleasant 45 yo F presenting without a medication list but with great recall of her regimen including dosages. She reports good compliance with her regimen and did not have any specific medication-related questions or concerns for me at this time.   Ruta Hinds. Velva Harman, PharmD, BCPS, CPP Clinical Pharmacist Pager: [phone] Phone: [phone] 09/27/2015 9:31 AM      Time with patient: 10 minutes Preparation and documentation time: 2 minutes Total time: 12 minutes

## 2015-10-10 ENCOUNTER — Other Ambulatory Visit (HOSPITAL_COMMUNITY): Payer: Self-pay | Admitting: Internal Medicine

## 2015-10-11 ENCOUNTER — Encounter (HOSPITAL_COMMUNITY): Payer: Medicare Other

## 2015-10-18 ENCOUNTER — Encounter (HOSPITAL_COMMUNITY): Payer: Medicare Other

## 2015-11-01 ENCOUNTER — Encounter (HOSPITAL_COMMUNITY): Payer: Self-pay

## 2015-11-01 ENCOUNTER — Ambulatory Visit (HOSPITAL_COMMUNITY)
Admission: RE | Admit: 2015-11-01 | Discharge: 2015-11-01 | Disposition: A | Discharge status: Home / Self Care | Payer: Medicare Other | Source: Ambulatory Visit | Attending: Cardiology | Admitting: Cardiology

## 2015-11-01 VITALS — BP 184/98 | HR 75 | Wt 329.2 lb

## 2015-11-01 DIAGNOSIS — Z6841 Body Mass Index (BMI) 40.0 and over, adult: Secondary | ICD-10-CM

## 2015-11-01 DIAGNOSIS — I10 Essential (primary) hypertension: Secondary | ICD-10-CM

## 2015-11-01 DIAGNOSIS — I130 Hypertensive heart and chronic kidney disease with heart failure and stage 1 through stage 4 chronic kidney disease, or unspecified chronic kidney disease: Secondary | ICD-10-CM | POA: Insufficient documentation

## 2015-11-01 DIAGNOSIS — I4891 Unspecified atrial fibrillation: Secondary | ICD-10-CM

## 2015-11-01 DIAGNOSIS — Z7901 Long term (current) use of anticoagulants: Secondary | ICD-10-CM | POA: Diagnosis not present

## 2015-11-01 DIAGNOSIS — I159 Secondary hypertension, unspecified: Secondary | ICD-10-CM

## 2015-11-01 DIAGNOSIS — M109 Gout, unspecified: Secondary | ICD-10-CM | POA: Diagnosis not present

## 2015-11-01 DIAGNOSIS — I482 Chronic atrial fibrillation: Secondary | ICD-10-CM | POA: Diagnosis not present

## 2015-11-01 DIAGNOSIS — N183 Chronic kidney disease, stage 3 unspecified: Secondary | ICD-10-CM

## 2015-11-01 DIAGNOSIS — Z79899 Other long term (current) drug therapy: Secondary | ICD-10-CM | POA: Insufficient documentation

## 2015-11-01 DIAGNOSIS — I5032 Chronic diastolic (congestive) heart failure: Secondary | ICD-10-CM | POA: Diagnosis not present

## 2015-11-01 DIAGNOSIS — I272 Other secondary pulmonary hypertension: Secondary | ICD-10-CM | POA: Diagnosis not present

## 2015-11-01 MED ORDER — CLONIDINE HCL 0.2 MG PO TABS: 0.4000 mg | ORAL_TABLET | Freq: Three times a day (TID) | ORAL | 6 refills | Status: DC

## 2015-11-01 NOTE — Patient Instructions (Signed)
Take an additional tablet of Clonidine (0.2 mg) as needed for SBP  (top number) greater than180  You have been referred to have a Renal Ultrasound  Labs today  Your physician recommends that you schedule a follow-up appointment in: 4-5 weeks   Do the following things EVERYDAY: 1) Weigh yourself in the morning before breakfast. Write it down and keep it in a log. 2) Take your medicines as prescribed 3) Eat low salt foods-Limit salt (sodium) to 2000 mg per day.  4) Stay as active as you can everyday 5) Limit all fluids for the day to less than 2 liters

## 2015-11-01 NOTE — Progress Notes (Signed)
Advanced Heart Failure Medication Review by a Pharmacist  Does the patient  feel that his/her medications are working for him/her?  yes  Has the patient been experiencing any side effects to the medications prescribed?  no  Does the patient measure his/her own blood pressure or blood glucose at home?  yes   Does the patient have any problems obtaining medications due to transportation or finances?   no  Understanding of regimen: good Understanding of indications: good Potential of compliance: good Patient understands to avoid NSAIDs. Patient understands to avoid decongestants.  Issues to address at subsequent visits: None   Pharmacist comments:  Ms. Bernacki is a pleasant 45 yo F presenting without a medication list but with good recall of her regimen. She reports great compliance with her regimen but she does state that he BP remains elevated. She did not have any specific medication-related questions or concerns for me at this time.   Ruta Hinds. Velva Harman, PharmD, BCPS, CPP Clinical Pharmacist Pager: [phone] Phone: [phone] 11/01/2015 9:25 AM      Time with patient: 10 minutes Preparation and documentation time: 2 minutes Total time: 12 minutes

## 2015-11-01 NOTE — Progress Notes (Signed)
Patient ID: EMILIANNA MURRIE, female   DOB: 1970-08-22, 45 y.o.   MRN: [id number]     Advanced Heart Failure Clinic Note   PCP: Dr Berdine Addison HF: Dr. Haroldine Laws   HPI: Ms Alsop is a 45 yo AAF with history of HTN, gout, morbid obesity, chronic atrial fibrillation -on eliquis (had DC-CV 99991111), and diastolic heart failure.   [id number] Echo: diastolic heart failure with ejection fraction of 55-60%, severe LVH, Mildly dilated RV. Mildly to mod dilated RA. moderate pericardial effusion and echocardiographic changes consistent with a possible infiltrative disease. PAPP 72 mmHg   Milrinone stopped 10/30 after RHC c/w high output HF.  RA = 16  RV = 81/14/20  PA = 78/32 (52)  PCW = 21  Fick cardiac output/index = 11.9/4.6  Thermo CO/CI = 9.8/3.8  PVR = 2.6 [place] (Fick)  FA sat = 91%  PA sat = 65%, 72%  SVC sat = 73%  RA sat = 66%  Admitted to [facility] on 12/10/11 for massive volume overload. Transferred to [facility] on 10/26 due to worsening renal function, weakness and volume overload. SPEP/UPEP/fat pad biopsy/bone marrow negative for amyloid. Echo findings thought to be combination of severe HTN and PAH related to obesity-hypoventilation syndrome. Bubble study +. Suspect opening of PFO in setting of PAH. Diuresed ~100 pounds while in house. Cr seems to be new baseline ~2.5. Discharged from [facility] 01/04/12 to [place]. Discharged from [facility]  01/25/12. D/C  weight was 229 pounds.   Admitted to [facility] from [facility] with marked volume overload. Diuresed with lasix drip and transitioned to torsemide 40 mg daily. Discharge weight 310 pounds.    She presents today for regular follow up.  At last visit torsemide increased with volume overload and given course of Doxy for bronchitis. Weight at home stable around 329-330.  Takes an extra torsemide as needed.  Feels like she doesn't have any extra fluid on.  Gets a lot more DOE when she does.  Can walk a "good ways" on flat ground. Gets mild dyspnea when she gets  into a hurry or going up steps.  Denies PND/Orthopnea/dizziness/lightheadedness. Has tried to cut back fluid. Eats fast food 1-2 times a week, but has been watching more closely.  Taking all medications as directed, doesn't miss any doses. Seens PCP this month.   ROS: All systems negative except as listed in HPI, PMH and Problem List.  Past Medical History:  Diagnosis Date  . Anemia   . Atrial flutter ([place])   . Chronic diastolic heart failure ([place])    a. Amyloidosis work-up negative 2013  b. 12/07/2014 ECHO EF 55-60%. LA severely dilated. RV normal RA moderately dilated, Severe LVH.IVC dilated.   . CKD (chronic kidney disease) stage 3, GFR 30-59 ml/min   . Essential hypertension, benign   . Gout   . History of cardiac catheterization    a. ectatic coronaries without obstruction 2006 - [place]  . Hypertensive heart disease   . Morbid obesity ([place])   . Persistent atrial fibrillation ([place])   . Pulmonary hypertension ([place])     Current Outpatient Prescriptions  Medication Sig Dispense Refill  . acetaminophen (TYLENOL) 325 MG tablet Take 650 mg by mouth every 6 (six) hours as needed for mild pain or headache.    . allopurinol (ZYLOPRIM) 100 MG tablet Take 100 mg by mouth daily.     Marland Kitchen amLODipine (NORVASC) 10 MG tablet Take 1 tablet (10 mg total) by mouth daily. 30 tablet 0  . apixaban (  ELIQUIS) 5 MG TABS tablet Take 5 mg by mouth 2 (two) times daily.    . carvedilol (COREG) 6.25 MG tablet Take 1 tablet (6.25 mg total) by mouth 2 (two) times daily with a meal. 60 tablet 6  . cloNIDine (CATAPRES) 0.2 MG tablet Take 2 tablets (0.4 mg total) by mouth 3 (three) times daily. 99991111 tablet 6  . folic acid (FOLVITE) 1 MG tablet Take 1 tablet (1 mg total) by mouth daily. 30 tablet 0  . hydrALAZINE (APRESOLINE) 100 MG tablet Take 1 tablet (100 mg total) by mouth 3 (three) times daily. MUST HAVE FOLLOW UP APPOINTMENT FOR FURTHER REFILLS 90 tablet 1  . loratadine (CLARITIN) 10 MG tablet Take 10 mg by mouth  daily.    . pantoprazole (PROTONIX) 40 MG tablet Take 1 tablet (40 mg total) by mouth daily. 30 tablet 0  . potassium chloride SA (K-DUR,KLOR-CON) 20 MEQ tablet Take 1 tablet (20 mEq total) by mouth daily. 30 tablet 3  . torsemide (DEMADEX) 20 MG tablet Take 40 mg by mouth daily. Take an additional 20 mg (1 tablet) in the afternoon for swelling in the legs or shortness of breath    . colchicine 0.6 MG tablet Take 0.6 mg by mouth daily as needed (gouty flare).     No current facility-administered medications for this encounter.     Vitals:   11/01/15 0913  BP: (!) 184/98  BP Location: Right Arm  Patient Position: Sitting  Cuff Size: Large  Pulse: 75  SpO2: 98%  Weight: (!) 329 lb 3.2 oz (149.3 kg)     Wt Readings from Last 3 Encounters:  11/01/15 (!) 329 lb 3.2 oz (149.3 kg)  09/27/15 (!) 329 lb (149.2 kg)  12/17/14 (!) 313 lb 12.8 oz (142.3 kg)    PHYSICAL EXAM: General: Well appearing. No resp difficulty.  HEENT: normal, keloid L neck  Neck: supple. JVP difficult to assess. Appears 8-9 cm Carotids 2+ bilat; no bruits. No thyromegaly or nodule noted Cor: PMI nondisplaced. Irregular. No M/G/R appreciated.  Lungs: Mildly diminished basilar sounds.  Abdomen: obese, soft, NT, ND, no HSM. No bruits or masses. +BS  Extremities: no cyanosis, clubbing, rash,  RLE /LLE  Large gouty nodule R elbow and digits . Trace to 1+ ankle edema, L>R with chronic venous stasis changes.  Neuro: alert & orientedx3, cranial nerves grossly intact. moves all 4 extremities w/o difficulty. Affect pleasant  ASSESSMENT & PLAN:  1) Chronic diastolic HF- ECHO [id number] EF 55-60% - NYHA II-III. Volume mildly elevated but no torsemide yet today.   - Continue torsemide 40 mg daily with extra 20 mg as needed. BMET today.   - Reinforced the need and importance of daily weights, and fluid and salt restriction.  - Instructed to call the HF clinic if weight increases more than 3 lbs overnight or 5 lbs in a week.    2) Afib- chronic -Had DC-CV 12/2011 and 06/2012 but back into A fib. Rate controlled. -Continue eliquis 5 mg BID . CBC today  3) HTN - SBP remains elevated but improved from last visit and improved after sitting for a few minutes in clinic. Continue hydralazine 100 mg TID and norvasc 10 mg daily. - Continue clonidine 0.4 mg TID. Take extra 0.2 mg prn for pressures > 180 at home.  - Continue carvedilol 6.25 mg BID - She states she has been taking BP daily at home and remains elevated at home. Asked to bring recordings with her to next visit.  -  Discussed life style modifications such as weight loss and low salt diet.  - Will schedule for Renal US with intractable HTN to look for RAS.  4) CKD Stage III-   - Perceived baseline ~ 2.5. BMET today.   5) Acute Bronchitis-  - Resolved with course of doxycycline 6) Obesity-  - Fluid seems relatively stable. Major issue is need to lose weight.  - Again discussed portion control and need to exercise.   Follow up 4-6 weeks for additional med titration as needed.   Satira Mccallum Zahriyah Joo PA-C 9:38 AM

## 2015-11-07 ENCOUNTER — Other Ambulatory Visit (HOSPITAL_COMMUNITY): Payer: Self-pay | Admitting: Student

## 2015-11-07 ENCOUNTER — Other Ambulatory Visit (HOSPITAL_COMMUNITY): Payer: Self-pay | Admitting: *Deleted

## 2015-11-07 DIAGNOSIS — I159 Secondary hypertension, unspecified: Secondary | ICD-10-CM

## 2015-11-07 DIAGNOSIS — I158 Other secondary hypertension: Secondary | ICD-10-CM

## 2015-11-08 ENCOUNTER — Telehealth (HOSPITAL_COMMUNITY): Payer: Self-pay | Admitting: *Deleted

## 2015-11-08 DIAGNOSIS — I158 Other secondary hypertension: Secondary | ICD-10-CM

## 2015-11-08 NOTE — Telephone Encounter (Addendum)
Tech at [facility] called to let us know that pt is sch for renal u/s there tomorrow to assess RAS however they do not do that test there.  She will contact pt to cancel.  New order placed for [facility], will have them contact pt to schedule

## 2015-11-09 ENCOUNTER — Ambulatory Visit (HOSPITAL_COMMUNITY): Admission: RE | Admit: 2015-11-09 | Payer: Medicare Other | Source: Ambulatory Visit

## 2015-11-09 ENCOUNTER — Telehealth (HOSPITAL_COMMUNITY): Payer: Self-pay | Admitting: Vascular Surgery

## 2015-11-09 NOTE — Telephone Encounter (Signed)
Sent Kerrin Champagne message to call pt to make renal appt

## 2015-11-24 ENCOUNTER — Inpatient Hospital Stay (HOSPITAL_COMMUNITY): Admission: RE | Admit: 2015-11-24 | Payer: Medicare Other | Source: Ambulatory Visit

## 2015-11-29 ENCOUNTER — Ambulatory Visit (HOSPITAL_COMMUNITY)
Admission: RE | Admit: 2015-11-29 | Discharge: 2015-11-29 | Disposition: A | Discharge status: Home / Self Care | Payer: Medicare Other | Source: Ambulatory Visit | Attending: Cardiovascular Disease | Admitting: Cardiovascular Disease

## 2015-11-29 DIAGNOSIS — I158 Other secondary hypertension: Secondary | ICD-10-CM | POA: Insufficient documentation

## 2015-12-03 ENCOUNTER — Other Ambulatory Visit (HOSPITAL_COMMUNITY): Payer: Self-pay | Admitting: Internal Medicine

## 2015-12-07 ENCOUNTER — Encounter (HOSPITAL_COMMUNITY): Payer: Medicare Other

## 2016-01-12 ENCOUNTER — Encounter (HOSPITAL_COMMUNITY): Payer: Self-pay | Admitting: Emergency Medicine

## 2016-01-12 ENCOUNTER — Emergency Department (HOSPITAL_COMMUNITY): Payer: Medicare Other

## 2016-01-12 ENCOUNTER — Emergency Department (HOSPITAL_COMMUNITY)
Admission: EM | Admit: 2016-01-12 | Discharge: 2016-01-12 | Disposition: A | Discharge status: Home / Self Care | Payer: Medicare Other | Attending: Emergency Medicine | Admitting: Emergency Medicine

## 2016-01-12 DIAGNOSIS — M10041 Idiopathic gout, right hand: Secondary | ICD-10-CM | POA: Diagnosis not present

## 2016-01-12 DIAGNOSIS — N183 Chronic kidney disease, stage 3 (moderate): Secondary | ICD-10-CM | POA: Insufficient documentation

## 2016-01-12 DIAGNOSIS — M109 Gout, unspecified: Secondary | ICD-10-CM

## 2016-01-12 DIAGNOSIS — I5032 Chronic diastolic (congestive) heart failure: Secondary | ICD-10-CM | POA: Insufficient documentation

## 2016-01-12 DIAGNOSIS — I130 Hypertensive heart and chronic kidney disease with heart failure and stage 1 through stage 4 chronic kidney disease, or unspecified chronic kidney disease: Secondary | ICD-10-CM | POA: Diagnosis not present

## 2016-01-12 DIAGNOSIS — M7989 Other specified soft tissue disorders: Secondary | ICD-10-CM | POA: Diagnosis not present

## 2016-01-12 DIAGNOSIS — Z79899 Other long term (current) drug therapy: Secondary | ICD-10-CM | POA: Diagnosis not present

## 2016-01-12 DIAGNOSIS — Z791 Long term (current) use of non-steroidal anti-inflammatories (NSAID): Secondary | ICD-10-CM | POA: Diagnosis not present

## 2016-01-12 DIAGNOSIS — M79641 Pain in right hand: Secondary | ICD-10-CM | POA: Diagnosis not present

## 2016-01-12 MED ORDER — CLONIDINE HCL 0.2 MG PO TABS
0.4000 mg | ORAL_TABLET | Freq: Once | ORAL | Status: AC
Start: 2016-01-12 — End: 2016-01-12
  Administered 2016-01-12: 0.4 mg via ORAL
  Filled 2016-01-12: qty 2

## 2016-01-12 MED ORDER — OXYCODONE-ACETAMINOPHEN 5-325 MG PO TABS: 1.0000 | ORAL_TABLET | Freq: Four times a day (QID) | ORAL | 0 refills | Status: DC | PRN

## 2016-01-12 MED ORDER — OXYCODONE-ACETAMINOPHEN 5-325 MG PO TABS
1.0000 | ORAL_TABLET | Freq: Once | ORAL | Status: AC
  Administered 2016-01-12: 1 via ORAL
  Filled 2016-01-12: qty 1

## 2016-01-12 MED ORDER — CARVEDILOL 12.5 MG PO TABS: 6.2500 mg | ORAL_TABLET | Freq: Two times a day (BID) | ORAL | Status: DC

## 2016-01-12 MED ORDER — CLONIDINE HCL 0.2 MG PO TABS: 0.2000 mg | ORAL_TABLET | Freq: Once | ORAL | Status: DC

## 2016-01-12 MED ORDER — PREDNISONE 50 MG PO TABS
60.0000 mg | ORAL_TABLET | Freq: Once | ORAL | Status: AC
  Administered 2016-01-12: 60 mg via ORAL
  Filled 2016-01-12: qty 1

## 2016-01-12 MED ORDER — AMLODIPINE BESYLATE 5 MG PO TABS
10.0000 mg | ORAL_TABLET | Freq: Once | ORAL | Status: AC
  Administered 2016-01-12: 10 mg via ORAL
  Filled 2016-01-12: qty 2

## 2016-01-12 MED ORDER — PREDNISONE 20 MG PO TABS: 60.0000 mg | ORAL_TABLET | Freq: Every day | ORAL | 0 refills | Status: AC

## 2016-01-12 MED ORDER — HYDRALAZINE HCL 25 MG PO TABS
100.0000 mg | ORAL_TABLET | Freq: Once | ORAL | Status: AC
  Administered 2016-01-12: 100 mg via ORAL
  Filled 2016-01-12: qty 4

## 2016-01-12 MED ORDER — CARVEDILOL 12.5 MG PO TABS
6.2500 mg | ORAL_TABLET | Freq: Once | ORAL | Status: AC
  Administered 2016-01-12: 6.25 mg via ORAL
  Filled 2016-01-12: qty 1

## 2016-01-12 NOTE — Discharge Instructions (Addendum)
Please read and follow all provided instructions.  Your diagnoses today include:  1. Acute gout of right hand, unspecified cause    Tests performed today include: Vital signs. See below for your results today.   Medications prescribed:  Take as prescribed   Home care instructions:  Follow any educational materials contained in this packet.  Follow-up instructions: Please follow-up with your primary care provider for further evaluation of symptoms and treatment   Return instructions:  Please return to the Emergency Department if you do not get better, if you get worse, or new symptoms OR  - Fever (temperature greater than 101.59F)  - Bleeding that does not stop with holding pressure to the area    -Severe pain (please note that you may be more sore the day after your accident)  - Chest Pain  - Difficulty breathing  - Severe nausea or vomiting  - Inability to tolerate food and liquids  - Passing out  - Skin becoming red around your wounds  - Change in mental status (confusion or lethargy)  - New numbness or weakness    Please return if you have any other emergent concerns.  Additional Information:  Your vital signs today were: BP 152/100    Pulse 90    Temp 98.9 F (37.2 C) (Oral)    Resp 18    Ht 5\' 10"  (1.778 m)    Wt 136.1 kg    LMP 12/13/2015    SpO2 95%    BMI 43.05 kg/m  If your blood pressure (BP) was elevated above 135/85 this visit, please have this repeated by your doctor within one month. ---------------

## 2016-01-12 NOTE — ED Triage Notes (Signed)
Pt states her right hand has been swelling since Tuesday going up forearm.  Denies injury.

## 2016-01-12 NOTE — ED Provider Notes (Signed)
[facility] DEPT Provider Note   CSN: [id number] Arrival date & time: 01/12/16  [id number]  History   Chief Complaint Chief Complaint  Patient presents with  . Hand Problem    HPI [name] is a 45 y.o. female.  HPI  45 y.o. female with a hx of HTN, CKD Stage 3, Chronic Diastolic HF, Gout, presents to the Emergency Department today complaining of right hand pain since Tuesday. No Trauma to area. Notes sitting on couch and having pain. Has hx of same. Notes gout flare in past in same area. Has not taken medications (BP medication, Allopurinol). No N/V. No fevers. No CP/SOB/ABD pain. No other symptoms noted.    Past Medical History:  Diagnosis Date  . Anemia   . Atrial flutter ([place])   . Chronic diastolic heart failure ([place])    a. Amyloidosis work-up negative 2013  b. 12/07/2014 ECHO EF 55-60%. LA severely dilated. RV normal RA moderately dilated, Severe LVH.IVC dilated.   . CKD (chronic kidney disease) stage 3, GFR 30-59 ml/min   . Essential hypertension, benign   . Gout   . History of cardiac catheterization    a. ectatic coronaries without obstruction 2006 - [place]  . Hypertensive heart disease   . Morbid obesity ([place])   . Persistent atrial fibrillation ([place])   . Pulmonary hypertension     Patient Active Problem List   Diagnosis Date Noted  . CKD (chronic kidney disease) stage 3, GFR 30-59 ml/min   . Chronic diastolic heart failure ([place]) 02/06/2012  . HTN (hypertension) 02/06/2012  . Anemia 12/27/2011  . Morbid obesity with BMI of 50.0-59.9, adult ([place]) 12/24/2011  . Pulmonary hypertension 12/15/2011  . Cellulitis of left leg 12/15/2011  . Anasarca 12/10/2011  . Accelerated hypertension 12/10/2011  . Elevated serum creatinine 12/10/2011  . Hyperbilirubinemia 12/10/2011  . Atrial fibrillation with controlled ventricular response ([place]) 12/10/2011  . Morbid obesity ([place]) 12/10/2011  . Acquired trigger finger 12/12/2010  . LATERAL EPICONDYLITIS 06/06/2009  .  BURSITIS, OLECRANON 12/10/2007    Past Surgical History:  Procedure Laterality Date  . CARDIOVERSION  01/15/2012   Procedure: CARDIOVERSION;  Surgeon: Jolaine Artist, MD;  Location: [facility] ENDOSCOPY;  Service: Cardiovascular;  Laterality: N/A;  . RIGHT HEART CATHETERIZATION N/A 12/26/2011   Procedure: RIGHT HEART CATH;  Surgeon: Jolaine Artist, MD;  Location: [facility] CATH LAB;  Service: Cardiovascular;  Laterality: N/A;  . SKIN BIOPSY  12/18/2011   Procedure: BIOPSY SKIN;  Surgeon: Donato Heinz, MD;  Location: [facility] ORS;  Service: General;  Laterality: N/A;  in the minor room.  . TEE WITHOUT CARDIOVERSION  01/15/2012   Procedure: TRANSESOPHAGEAL ECHOCARDIOGRAM (TEE);  Surgeon: Jolaine Artist, MD;  Location: [facility] ENDOSCOPY;  Service: Cardiovascular;  Laterality: N/A;  cardioversion/on xarelto    OB History    Gravida Para Term Preterm AB Living             0   SAB TAB Ectopic Multiple Live Births                   Home Medications    Prior to Admission medications   Medication Sig Start Date End Date Taking? Authorizing Provider  acetaminophen (TYLENOL) 325 MG tablet Take 650 mg by mouth every 6 (six) hours as needed for mild pain or headache.    Historical Provider, MD  allopurinol (ZYLOPRIM) 100 MG tablet Take 100 mg by mouth daily.  01/04/12   Theodis Blaze, MD  amLODipine (NORVASC) 10  MG tablet Take 1 tablet (10 mg total) by mouth daily. 12/20/11   Nimish Luther Parody, MD  apixaban (ELIQUIS) 5 MG TABS tablet Take 5 mg by mouth 2 (two) times daily.    Historical Provider, MD  carvedilol (COREG) 6.25 MG tablet Take 1 tablet (6.25 mg total) by mouth 2 (two) times daily with a meal. 09/27/15   Amy D Clegg, NP  cloNIDine (CATAPRES) 0.2 MG tablet Take 2 tablets (0.4 mg total) by mouth 3 (three) times daily. May take an additional tab prn for SBP>180 11/01/15   Shirley Friar, PA-C  colchicine 0.6 MG tablet Take 0.6 mg by mouth daily as needed (gouty flare).    Historical Provider, MD    folic acid (FOLVITE) 1 MG tablet Take 1 tablet (1 mg total) by mouth daily. 12/20/11   Nimish Luther Parody, MD  hydrALAZINE (APRESOLINE) 100 MG tablet Take 1 tablet (100 mg total) by mouth 3 (three) times daily. 12/09/15   Jolaine Artist, MD  loratadine (CLARITIN) 10 MG tablet Take 10 mg by mouth daily.    Historical Provider, MD  pantoprazole (PROTONIX) 40 MG tablet Take 1 tablet (40 mg total) by mouth daily. 12/20/11   Nimish Luther Parody, MD  potassium chloride SA (K-DUR,KLOR-CON) 20 MEQ tablet Take 1 tablet (20 mEq total) by mouth daily. 12/12/14   Eileen Stanford, PA-C  torsemide (DEMADEX) 20 MG tablet Take 40 mg by mouth daily. Take an additional 20 mg (1 tablet) in the afternoon for swelling in the legs or shortness of breath    Historical Provider, MD    Family History Family History  Problem Relation Age of Onset  . Hypertension Mother   . Diabetes Mother     Social History Social History  Substance Use Topics  . Smoking status: Never Smoker  . Smokeless tobacco: Never Used  . Alcohol use No     Allergies   Penicillins   Review of Systems Review of Systems ROS reviewed and all are negative for acute change except as noted in the HPI.  Physical Exam Updated Vital Signs BP (!) 236/137 (BP Location: Left Arm) Comment: Pt has not been taking bp meds.   Pulse 72   Temp 98.9 F (37.2 C) (Oral)   Resp 18   Ht 5\' 10"  (1.778 m)   Wt 136.1 kg   LMP 12/13/2015   SpO2 100%   BMI 43.05 kg/m   Physical Exam  Constitutional: She is oriented to person, place, and time. Vital signs are normal. She appears well-developed and well-nourished.  HENT:  Head: Normocephalic and atraumatic.  Right Ear: Hearing normal.  Left Ear: Hearing normal.  Eyes: Conjunctivae and EOM are normal. Pupils are equal, round, and reactive to light.  Neck: Normal range of motion. Neck supple.  Cardiovascular: Normal rate, regular rhythm, normal heart sounds and intact distal pulses.    Pulmonary/Chest: Effort normal and breath sounds normal.  Musculoskeletal: Normal range of motion.  Right Hand Localized swelling noted on right hand. No erythema. No signs of cellulitis. No red streaking. No signs of infection. TTP. Non pitting. NVI. Motor/sensation intact. Cap refill <2sec.   Neurological: She is alert and oriented to person, place, and time.  Skin: Skin is warm and dry.  Psychiatric: She has a normal mood and affect. Her speech is normal and behavior is normal. Thought content normal.  Nursing note and vitals reviewed.  ED Treatments / Results  Labs (all labs ordered are listed, but only  abnormal results are displayed) Labs Reviewed - No data to display  EKG  EKG Interpretation None       Radiology Dg Hand Complete Right  Result Date: 01/12/2016 CLINICAL DATA:  Wound right hand pain and swelling beginning 01/10/2016, worsening. No known injury. EXAM: RIGHT HAND - COMPLETE 3+ VIEW COMPARISON:  None. FINDINGS: Soft tissues about the hand are diffusely and markedly swollen. Erosions are seen about the DIP joints of the index and long fingers. There is also likely an erosion in the ulnar aspect of the head of the proximal phalanx of the long finger. Small defects in the base of the proximal phalanx left little finger and radial side of the proximal phalanx of the index finger on the ulnar side is also compatible with an erosion. Joint spaces appear preserved. Mineralization is normal. There is no fracture or dislocation. IMPRESSION: Diffuse and marked soft tissue swelling. Scattered erosive change about the MCP, PIP and DIP joints as described are nonspecific and may reflect inflammatory arthropathy such as rheumatoid although DIP joint involvement is unusual. Crystal arthropathy such as gout or changes related to chronic renal insufficiency are also considerations. Electronically Signed   By: Inge Rise M.D.   On: 01/12/2016 11:37    Procedures Procedures  (including critical care time)  Medications Ordered in ED Medications - No data to display   Initial Impression / Assessment and Plan / ED Course  I have reviewed the triage vital signs and the nursing notes.  Pertinent labs & imaging results that were available during my care of the patient were reviewed by me and considered in my medical decision making (see chart for details).  Clinical Course    Final Clinical Impressions(s) / ED Diagnoses  I have reviewed and evaluated the relevant imaging studies.  I have reviewed the relevant previous healthcare records.I obtained HPI from historian. Patient discussed with supervising physician  ED Course:  Assessment: Pt is a 79yF with hx HTN, CKD Stage 3, Chronic Diastolic HF, Gout who presents with right hand pain/swelling since Tuesday. No injury to area. Hx same with gout. Did not take BP meds and allopurinol due to pain in hand. On exam, pt in NAD. Nontoxic/nonseptic appearing. VSS. Afebrile. Lungs CTA. Heart RRR. Right hand NVI. Motor/sensation intact. Swelling noted without erythema or signs of infection. Likely gout flare. Imaging suggestive of finding with no acute fractures seen. Given BP medications in ED as well as analgesia with improvement. Will Tx with short course steroids. Discussed with attending physician. Plan is to DC home with steroids and follow up to PCP. At time of discharge, Patient is in no acute distress. Vital Signs are stable. Patient is able to ambulate. Patient able to tolerate PO.   Disposition/Plan:  DC Home Additional Verbal discharge instructions given and discussed with patient.  Pt Instructed to f/u with PCP in the next week for evaluation and treatment of symptoms. Return precautions given Pt acknowledges and agrees with plan  Supervising Physician Francine Graven, DO   Final diagnoses:  Acute gout of right hand, unspecified cause    New Prescriptions New Prescriptions   No medications on file       Shary Decamp, PA-C 01/12/16 [place], DO 01/14/16 2016

## 2016-01-12 NOTE — ED Notes (Signed)
Pt states she has not been taking bp meds due to pain in her hand.

## 2016-01-15 ENCOUNTER — Other Ambulatory Visit (HOSPITAL_COMMUNITY): Payer: Self-pay | Admitting: Internal Medicine

## 2016-01-15 DIAGNOSIS — I5032 Chronic diastolic (congestive) heart failure: Secondary | ICD-10-CM

## 2016-05-04 ENCOUNTER — Encounter (HOSPITAL_COMMUNITY): Payer: Medicare Other

## 2016-05-08 ENCOUNTER — Encounter (HOSPITAL_COMMUNITY): Payer: Medicare Other

## 2016-05-10 ENCOUNTER — Encounter (HOSPITAL_COMMUNITY): Payer: Medicare Other

## 2016-05-17 ENCOUNTER — Ambulatory Visit (HOSPITAL_COMMUNITY)
Admission: RE | Admit: 2016-05-17 | Discharge: 2016-05-17 | Disposition: A | Discharge status: Home / Self Care | Payer: Medicare Other | Source: Ambulatory Visit | Attending: Internal Medicine | Admitting: Internal Medicine

## 2016-05-17 ENCOUNTER — Encounter (HOSPITAL_COMMUNITY): Payer: Self-pay

## 2016-05-17 VITALS — BP 159/101 | HR 84 | Wt 304.6 lb

## 2016-05-17 DIAGNOSIS — Z7901 Long term (current) use of anticoagulants: Secondary | ICD-10-CM | POA: Insufficient documentation

## 2016-05-17 DIAGNOSIS — I5032 Chronic diastolic (congestive) heart failure: Secondary | ICD-10-CM | POA: Insufficient documentation

## 2016-05-17 DIAGNOSIS — Z6841 Body Mass Index (BMI) 40.0 and over, adult: Secondary | ICD-10-CM | POA: Insufficient documentation

## 2016-05-17 DIAGNOSIS — N183 Chronic kidney disease, stage 3 unspecified: Secondary | ICD-10-CM

## 2016-05-17 DIAGNOSIS — I159 Secondary hypertension, unspecified: Secondary | ICD-10-CM | POA: Diagnosis not present

## 2016-05-17 DIAGNOSIS — M109 Gout, unspecified: Secondary | ICD-10-CM | POA: Insufficient documentation

## 2016-05-17 DIAGNOSIS — I272 Pulmonary hypertension, unspecified: Secondary | ICD-10-CM | POA: Insufficient documentation

## 2016-05-17 DIAGNOSIS — I10 Essential (primary) hypertension: Secondary | ICD-10-CM | POA: Diagnosis not present

## 2016-05-17 DIAGNOSIS — I130 Hypertensive heart and chronic kidney disease with heart failure and stage 1 through stage 4 chronic kidney disease, or unspecified chronic kidney disease: Secondary | ICD-10-CM | POA: Insufficient documentation

## 2016-05-17 DIAGNOSIS — Z79899 Other long term (current) drug therapy: Secondary | ICD-10-CM | POA: Insufficient documentation

## 2016-05-17 DIAGNOSIS — I4891 Unspecified atrial fibrillation: Secondary | ICD-10-CM

## 2016-05-17 DIAGNOSIS — I482 Chronic atrial fibrillation: Secondary | ICD-10-CM | POA: Insufficient documentation

## 2016-05-17 LAB — BASIC METABOLIC PANEL
ANION GAP: 10 (ref 5–15)
BUN: 26 mg/dL — ABNORMAL HIGH (ref 6–20)
CALCIUM: 9 mg/dL (ref 8.9–10.3)
CO2: 26 mmol/L (ref 22–32)
Chloride: 105 mmol/L (ref 101–111)
Creatinine, Ser: 1.76 mg/dL — ABNORMAL HIGH (ref 0.44–1.00)
GFR, EST AFRICAN AMERICAN: 39 mL/min — AB (ref >60)
GFR, EST NON AFRICAN AMERICAN: 34 mL/min — AB (ref >60)
GLUCOSE: 102 mg/dL — AB (ref 65–99)
Potassium: 3.3 mmol/L — ABNORMAL LOW (ref 3.5–5.1)
SODIUM: 141 mmol/L (ref 135–145)

## 2016-05-17 MED ORDER — SPIRONOLACTONE 25 MG PO TABS: 25.0000 mg | ORAL_TABLET | Freq: Every day | ORAL | 6 refills | Status: DC

## 2016-05-17 MED ORDER — CLONIDINE HCL 0.2 MG PO TABS
0.4000 mg | ORAL_TABLET | Freq: Once | ORAL | Status: AC
  Administered 2016-05-17: 0.4 mg via ORAL
  Filled 2016-05-17: qty 2

## 2016-05-17 MED ORDER — HYDRALAZINE HCL 50 MG PO TABS
100.0000 mg | ORAL_TABLET | Freq: Once | ORAL | Status: AC
  Administered 2016-05-17: 100 mg via ORAL
  Filled 2016-05-17: qty 2

## 2016-05-17 NOTE — Patient Instructions (Signed)
START Spironolactone 25 mg tablet once daily at bedtime.  Follow up 2 weeks with Oda Kilts PA-C.  Do the following things EVERYDAY: 1) Weigh yourself in the morning before breakfast. Write it down and keep it in a log. 2) Take your medicines as prescribed 3) Eat low salt foods-Limit salt (sodium) to 2000 mg per day.  4) Stay as active as you can everyday 5) Limit all fluids for the day to less than 2 liters

## 2016-05-17 NOTE — Progress Notes (Signed)
Patient ID: [name], female   DOB: January 07, 1971, 46 y.o.   MRN: [id number]     Advanced Heart Failure Clinic Note   PCP: Dr Berdine Addison HF: Dr. Haroldine Laws   HPI: [name] is a 46 yo AAF with history of HTN, gout, morbid obesity, chronic atrial fibrillation -on eliquis (had DC-CV 25/85/27), and diastolic heart failure.   78/2/42 Echo: diastolic heart failure with ejection fraction of 55-60%, severe LVH, Mildly dilated RV. Mildly to mod dilated RA. moderate pericardial effusion and echocardiographic changes consistent with a possible infiltrative disease. PAPP 72 mmHg   Milrinone stopped 10/30 after RHC c/w high output HF.  RA = 16  RV = 81/14/20  PA = 78/32 (52)  PCW = 21  Fick cardiac output/index = 11.9/4.6  Thermo CO/CI = 9.8/3.8  PVR = 2.6 [place] (Fick)  FA sat = 91%  PA sat = 65%, 72%  SVC sat = 73%  RA sat = 66%  Admitted to [facility] on 12/10/11 for massive volume overload. Transferred to [facility] on 10/26 due to worsening renal function, weakness and volume overload. SPEP/UPEP/fat pad biopsy/bone marrow negative for amyloid. Echo findings thought to be combination of severe HTN and PAH related to obesity-hypoventilation syndrome. Bubble study +. Suspect opening of PFO in setting of PAH. Diuresed ~100 pounds while in house. Cr seems to be new baseline ~2.5. Discharged from [facility] 01/04/12 to [place]. Discharged from [facility]  01/25/12. D/C  weight was 229 pounds.   Admitted to [facility] from [facility] with marked volume overload. Diuresed with lasix drip and transitioned to torsemide 40 mg daily. Discharge weight 310 pounds.    She presents today for regular follow up. Last seen in 10/2015.  Down 25 lbs since that visit.  BP elevated on arrival.  Had a very stressful morning, had trouble getting a ride. Took hydralazine and clonidine at 0500 this am. Hasn't needed any extra torsemide. Takes extra clonidine about 3 times a week.  Takes it when BP is > 180.  BP at home tends to run 160/90. No SOB  on flat ground. No orthopnea or bendopnea. Does get occasionally when she "has fluid".  No exertional chest pain.  Has cut back on fast food.  + SOB if "in a hurry".  Denies HAs, vision changes, unilateral weakness, slurred speech, or confusion.  No bleeding on eliquis.   Review of systems complete and found to be negative unless listed in HPI.    Past Medical History:  Diagnosis Date  . Anemia   . Atrial flutter ([place])   . Chronic diastolic heart failure ([place])    a. Amyloidosis work-up negative 2013  b. 12/07/2014 ECHO EF 55-60%. LA severely dilated. RV normal RA moderately dilated, Severe LVH.IVC dilated.   . CKD (chronic kidney disease) stage 3, GFR 30-59 ml/min   . Essential hypertension, benign   . Gout   . History of cardiac catheterization    a. ectatic coronaries without obstruction 2006 - [place]  . Hypertensive heart disease   . Morbid obesity ([place])   . Persistent atrial fibrillation ([place])   . Pulmonary hypertension     Current Outpatient Prescriptions  Medication Sig Dispense Refill  . acetaminophen (TYLENOL) 325 MG tablet Take 650 mg by mouth every 6 (six) hours as needed for mild pain or headache.    . allopurinol (ZYLOPRIM) 100 MG tablet Take 100 mg by mouth daily.     Marland Kitchen amLODipine (NORVASC) 10 MG tablet Take 1 tablet (10 mg  total) by mouth daily. 30 tablet 0  . apixaban (ELIQUIS) 5 MG TABS tablet Take 5 mg by mouth 2 (two) times daily.    . carvedilol (COREG) 6.25 MG tablet Take 1 tablet (6.25 mg total) by mouth 2 (two) times daily with a meal. 60 tablet 6  . cloNIDine (CATAPRES) 0.2 MG tablet Take 2 tablets (0.4 mg total) by mouth 3 (three) times daily. May take an additional tab prn for SBP>180 200 tablet 6  . colchicine 0.6 MG tablet Take 0.6 mg by mouth daily as needed (gouty flare).    . folic acid (FOLVITE) 1 MG tablet Take 1 tablet (1 mg total) by mouth daily. 30 tablet 0  . hydrALAZINE (APRESOLINE) 100 MG tablet Take 1 tablet (100 mg total) by mouth 3 (three)  times daily. 90 tablet 3  . loratadine (CLARITIN) 10 MG tablet Take 10 mg by mouth daily.    Marland Kitchen oxyCODONE-acetaminophen (PERCOCET/ROXICET) 5-325 MG tablet Take 1 tablet by mouth every 6 (six) hours as needed for severe pain. 7 tablet 0  . pantoprazole (PROTONIX) 40 MG tablet Take 1 tablet (40 mg total) by mouth daily. 30 tablet 0  . potassium chloride SA (K-DUR,KLOR-CON) 20 MEQ tablet Take 1 tablet (20 mEq total) by mouth daily. 30 tablet 3  . torsemide (DEMADEX) 20 MG tablet Take 40 mg by mouth daily. Take an additional 20 mg (1 tablet) in the afternoon for swelling in the legs or shortness of breath     No current facility-administered medications for this encounter.     Vitals:   05/17/16 1122  BP: (!) 218/138  Pulse: 84  SpO2: 96%  Weight: (!) 304 lb 9.6 oz (138.2 kg)     Wt Readings from Last 3 Encounters:  05/17/16 (!) 304 lb 9.6 oz (138.2 kg)  01/12/16 300 lb (136.1 kg)  11/01/15 (!) 329 lb 3.2 oz (149.3 kg)    PHYSICAL EXAM: General: Obese. NAD.  HEENT: Normal, large keloid left neck.   Neck: supple. JVP difficult to assess due to body habitus. Appears ~ 7-8 cm. Carotids 2+ bilat, no bruits. No thyromegaly or nodule noted.  Cor: PMI nondisplaced, Irregular. No M/G/R noted.  Lungs: clear, normal effort.   Abdomen: obese, soft, NT, ND, no HSM. No bruits or masses. +BS  Extremities: No cyanosis, clubbing, rash,   Large tophi R elbow and digits. Trace ankle edema.   Neuro: Alert & oriented x 3. Cranial nerves grossly intact. Moves all 4 extremities w/o difficulty. Affect pleasant   EKG Afib slow RVR 56 bpm  ASSESSMENT & PLAN:  1) Chronic diastolic HF- ECHO 70/1779 EF 55-60% - NYHA II-III.  - Volume status stable on exam.  - Continue torsemide 40 mg daily. Creatinine stable on BMET today.  - Reinforced fluid restriction to < 2 L daily, sodium restriction to less than 2000 mg daily, and the importance of daily weights.    2) Afib- chronic -Had DC-CV 12/2011 and 06/2012 but  back into A fib.  - EKG today with rate controlled A fib.  -Continue eliquis 5 mg BID . CBC today  3) Accelerated hypertension with hypertensive Urgency - BP markedly elevated in clinic today. Asymptomatic - Given afternoon dose of hydralazine and clonidine - Continue hydralazine 100 mg TID - Continue Norvasc 10 mg daily.  - Continue clonidine 0.4 mg TID - Continue coreg 6.25 mg BID - Pt is adamant she is compliant with her medications. States pressures run in 160/90s majority of the time  at home, but takes clonidine extra a few times a week.  - Renal US 11/29/15 with no RA.  - Will add Spironolactone 25 mg qhs.  BMET stable today. Will see check again in 10-14 days with follow up.  4) CKD Stage III-   - Stat BMET today with Creatinine 1.7. Appears to be her baseline.  5) Obesity-  - Encouraged to limit portion size and increase activity as tolerated.    Pt given 100 mg hydralazine and 0.4 mg clonidine at ~12:08.  BP 238/110 @ 1154 -> Given dose of hydralazine and clonidine. BP 200/120 @ 1230  BP 180/94 @ 1240  Pt BP markedly elevated in clinic. Given medications with trend in BP as above.  Asymptomatic and BP improved. Will add spironolactone and keep close 2 week follow up for now. Reviewed alarm symptoms with patient such as "worse headache ever", unilateral numbness/tingling/weakness, slurred speech, blurred vision, or syncope. Pt knows to call 911 or report to ED with any of these.   Satira Mccallum Amaris Garrette PA-C 11:43 AM   Greater than 50% of the 40 minute visit was spent in counseling/coordination of care regarding disease state education, medication reconciliation, consultation with Pharm-D, and re-evaluating the patient after medication administration.

## 2016-06-01 ENCOUNTER — Encounter (HOSPITAL_COMMUNITY): Payer: Medicare Other

## 2016-06-11 ENCOUNTER — Encounter (HOSPITAL_COMMUNITY): Payer: Self-pay

## 2016-06-25 ENCOUNTER — Encounter (HOSPITAL_COMMUNITY): Payer: Self-pay

## 2016-06-26 ENCOUNTER — Inpatient Hospital Stay (HOSPITAL_COMMUNITY)
Admission: EM | Admit: 2016-06-26 | Discharge: 2016-07-02 | DRG: 304 | Disposition: A | Discharge status: Home / Self Care | Payer: Self-pay | Attending: Internal Medicine | Admitting: Internal Medicine

## 2016-06-26 ENCOUNTER — Encounter (HOSPITAL_COMMUNITY): Payer: Self-pay | Admitting: Emergency Medicine

## 2016-06-26 ENCOUNTER — Emergency Department (HOSPITAL_COMMUNITY): Payer: Self-pay

## 2016-06-26 DIAGNOSIS — I10 Essential (primary) hypertension: Secondary | ICD-10-CM | POA: Diagnosis present

## 2016-06-26 DIAGNOSIS — Z79891 Long term (current) use of opiate analgesic: Secondary | ICD-10-CM

## 2016-06-26 DIAGNOSIS — E785 Hyperlipidemia, unspecified: Secondary | ICD-10-CM | POA: Diagnosis present

## 2016-06-26 DIAGNOSIS — Z880 Allergy status to penicillin: Secondary | ICD-10-CM

## 2016-06-26 DIAGNOSIS — I272 Pulmonary hypertension, unspecified: Secondary | ICD-10-CM | POA: Diagnosis present

## 2016-06-26 DIAGNOSIS — R509 Fever, unspecified: Secondary | ICD-10-CM | POA: Diagnosis present

## 2016-06-26 DIAGNOSIS — R112 Nausea with vomiting, unspecified: Secondary | ICD-10-CM | POA: Diagnosis present

## 2016-06-26 DIAGNOSIS — I161 Hypertensive emergency: Principal | ICD-10-CM | POA: Diagnosis present

## 2016-06-26 DIAGNOSIS — E876 Hypokalemia: Secondary | ICD-10-CM | POA: Diagnosis present

## 2016-06-26 DIAGNOSIS — Z8249 Family history of ischemic heart disease and other diseases of the circulatory system: Secondary | ICD-10-CM

## 2016-06-26 DIAGNOSIS — K219 Gastro-esophageal reflux disease without esophagitis: Secondary | ICD-10-CM | POA: Diagnosis present

## 2016-06-26 DIAGNOSIS — M109 Gout, unspecified: Secondary | ICD-10-CM | POA: Diagnosis present

## 2016-06-26 DIAGNOSIS — D638 Anemia in other chronic diseases classified elsewhere: Secondary | ICD-10-CM | POA: Diagnosis present

## 2016-06-26 DIAGNOSIS — Z79899 Other long term (current) drug therapy: Secondary | ICD-10-CM

## 2016-06-26 DIAGNOSIS — Z6841 Body Mass Index (BMI) 40.0 and over, adult: Secondary | ICD-10-CM

## 2016-06-26 DIAGNOSIS — Z833 Family history of diabetes mellitus: Secondary | ICD-10-CM

## 2016-06-26 DIAGNOSIS — R739 Hyperglycemia, unspecified: Secondary | ICD-10-CM | POA: Diagnosis present

## 2016-06-26 DIAGNOSIS — I509 Heart failure, unspecified: Secondary | ICD-10-CM

## 2016-06-26 DIAGNOSIS — J810 Acute pulmonary edema: Secondary | ICD-10-CM

## 2016-06-26 DIAGNOSIS — J811 Chronic pulmonary edema: Secondary | ICD-10-CM

## 2016-06-26 DIAGNOSIS — I482 Chronic atrial fibrillation: Secondary | ICD-10-CM | POA: Diagnosis present

## 2016-06-26 DIAGNOSIS — Z9114 Patient's other noncompliance with medication regimen: Secondary | ICD-10-CM

## 2016-06-26 DIAGNOSIS — I160 Hypertensive urgency: Secondary | ICD-10-CM | POA: Diagnosis present

## 2016-06-26 DIAGNOSIS — N184 Chronic kidney disease, stage 4 (severe): Secondary | ICD-10-CM | POA: Diagnosis present

## 2016-06-26 DIAGNOSIS — R0602 Shortness of breath: Secondary | ICD-10-CM

## 2016-06-26 DIAGNOSIS — I4891 Unspecified atrial fibrillation: Secondary | ICD-10-CM | POA: Diagnosis present

## 2016-06-26 DIAGNOSIS — I5033 Acute on chronic diastolic (congestive) heart failure: Secondary | ICD-10-CM | POA: Diagnosis present

## 2016-06-26 DIAGNOSIS — Z7901 Long term (current) use of anticoagulants: Secondary | ICD-10-CM

## 2016-06-26 DIAGNOSIS — N183 Chronic kidney disease, stage 3 (moderate): Secondary | ICD-10-CM | POA: Diagnosis present

## 2016-06-26 DIAGNOSIS — D649 Anemia, unspecified: Secondary | ICD-10-CM | POA: Diagnosis present

## 2016-06-26 DIAGNOSIS — R030 Elevated blood-pressure reading, without diagnosis of hypertension: Secondary | ICD-10-CM | POA: Diagnosis not present

## 2016-06-26 DIAGNOSIS — I5032 Chronic diastolic (congestive) heart failure: Secondary | ICD-10-CM | POA: Diagnosis present

## 2016-06-26 DIAGNOSIS — N179 Acute kidney failure, unspecified: Secondary | ICD-10-CM | POA: Diagnosis present

## 2016-06-26 DIAGNOSIS — I5031 Acute diastolic (congestive) heart failure: Secondary | ICD-10-CM

## 2016-06-26 DIAGNOSIS — I130 Hypertensive heart and chronic kidney disease with heart failure and stage 1 through stage 4 chronic kidney disease, or unspecified chronic kidney disease: Secondary | ICD-10-CM | POA: Diagnosis present

## 2016-06-26 LAB — URINALYSIS, ROUTINE W REFLEX MICROSCOPIC
BILIRUBIN URINE: NEGATIVE
Glucose, UA: NEGATIVE mg/dL
KETONES UR: NEGATIVE mg/dL
Leukocytes, UA: NEGATIVE
NITRITE: NEGATIVE
Protein, ur: 100 mg/dL — AB
Specific Gravity, Urine: 1.011 (ref 1.005–1.030)
pH: 5 (ref 5.0–8.0)

## 2016-06-26 LAB — COMPREHENSIVE METABOLIC PANEL
ALT: 9 U/L — ABNORMAL LOW (ref 14–54)
AST: 16 U/L (ref 15–41)
Albumin: 3.4 g/dL — ABNORMAL LOW (ref 3.5–5.0)
Alkaline Phosphatase: 101 U/L (ref 38–126)
Anion gap: 11 (ref 5–15)
BUN: 28 mg/dL — ABNORMAL HIGH (ref 6–20)
CO2: 21 mmol/L — ABNORMAL LOW (ref 22–32)
Calcium: 9 mg/dL (ref 8.9–10.3)
Chloride: 106 mmol/L (ref 101–111)
Creatinine, Ser: 1.85 mg/dL — ABNORMAL HIGH (ref 0.44–1.00)
GFR calc Af Amer: 37 mL/min — ABNORMAL LOW (ref >60)
GFR calc non Af Amer: 32 mL/min — ABNORMAL LOW (ref >60)
Glucose, Bld: 151 mg/dL — ABNORMAL HIGH (ref 65–99)
Potassium: 3.3 mmol/L — ABNORMAL LOW (ref 3.5–5.1)
Sodium: 138 mmol/L (ref 135–145)
Total Bilirubin: 1.2 mg/dL (ref 0.3–1.2)
Total Protein: 9.7 g/dL — ABNORMAL HIGH (ref 6.5–8.1)

## 2016-06-26 LAB — CBC
HCT: 37.4 % (ref 36.0–46.0)
Hemoglobin: 12.3 g/dL (ref 12.0–15.0)
MCH: 29.9 pg (ref 26.0–34.0)
MCHC: 32.9 g/dL (ref 30.0–36.0)
MCV: 90.8 fL (ref 78.0–100.0)
Platelets: 250 10*3/uL (ref 150–400)
RBC: 4.12 MIL/uL (ref 3.87–5.11)
RDW: 13.8 % (ref 11.5–15.5)
WBC: 19.3 10*3/uL — ABNORMAL HIGH (ref 4.0–10.5)

## 2016-06-26 LAB — URIC ACID: URIC ACID, SERUM: 9 mg/dL — AB (ref 2.3–6.6)

## 2016-06-26 LAB — RAPID URINE DRUG SCREEN, HOSP PERFORMED
Amphetamines: NOT DETECTED
BARBITURATES: NOT DETECTED
Benzodiazepines: NOT DETECTED
COCAINE: NOT DETECTED
Opiates: NOT DETECTED
Tetrahydrocannabinol: NOT DETECTED

## 2016-06-26 LAB — GLUCOSE, CAPILLARY
GLUCOSE-CAPILLARY: 106 mg/dL — AB (ref 65–99)
Glucose-Capillary: 102 mg/dL — ABNORMAL HIGH (ref 65–99)

## 2016-06-26 LAB — I-STAT TROPONIN, ED: Troponin i, poc: 0.03 ng/mL (ref 0.00–0.08)

## 2016-06-26 LAB — TROPONIN I: Troponin I: 0.08 ng/mL (ref <0.03)

## 2016-06-26 LAB — AMYLASE: Amylase: 87 U/L (ref 28–100)

## 2016-06-26 LAB — LIPASE, BLOOD: Lipase: 21 U/L (ref 11–51)

## 2016-06-26 LAB — TSH: TSH: 1.802 u[IU]/mL (ref 0.350–4.500)

## 2016-06-26 LAB — BRAIN NATRIURETIC PEPTIDE: B Natriuretic Peptide: 607.5 pg/mL — ABNORMAL HIGH (ref 0.0–100.0)

## 2016-06-26 LAB — PROCALCITONIN: Procalcitonin: 0.66 ng/mL

## 2016-06-26 LAB — LACTIC ACID, PLASMA: Lactic Acid, Venous: 0.9 mmol/L (ref 0.5–1.9)

## 2016-06-26 MED ORDER — ACETAMINOPHEN 650 MG RE SUPP: 650.0000 mg | Freq: Four times a day (QID) | RECTAL | Status: DC | PRN

## 2016-06-26 MED ORDER — COLCHICINE 0.6 MG PO TABS: 0.6000 mg | ORAL_TABLET | Freq: Every day | ORAL | Status: DC | PRN

## 2016-06-26 MED ORDER — PANTOPRAZOLE SODIUM 40 MG PO TBEC
40.0000 mg | DELAYED_RELEASE_TABLET | Freq: Every day | ORAL | Status: DC
  Administered 2016-06-26 – 2016-07-02 (×7): 40 mg via ORAL
  Filled 2016-06-26 (×7): qty 1

## 2016-06-26 MED ORDER — APIXABAN 5 MG PO TABS
5.0000 mg | ORAL_TABLET | Freq: Two times a day (BID) | ORAL | Status: DC
  Administered 2016-06-26 – 2016-07-02 (×12): 5 mg via ORAL
  Filled 2016-06-26 (×12): qty 1

## 2016-06-26 MED ORDER — ACETAMINOPHEN 325 MG PO TABS: 650.0000 mg | ORAL_TABLET | Freq: Four times a day (QID) | ORAL | Status: DC | PRN

## 2016-06-26 MED ORDER — BISACODYL 10 MG RE SUPP: 10.0000 mg | Freq: Every day | RECTAL | Status: DC | PRN

## 2016-06-26 MED ORDER — TORSEMIDE 20 MG PO TABS: 40.0000 mg | ORAL_TABLET | Freq: Every day | ORAL | Status: DC

## 2016-06-26 MED ORDER — HYDRALAZINE HCL 20 MG/ML IJ SOLN
5.0000 mg | Freq: Once | INTRAMUSCULAR | Status: AC
  Administered 2016-06-26: 5 mg via INTRAVENOUS
  Filled 2016-06-26: qty 1

## 2016-06-26 MED ORDER — AMLODIPINE BESYLATE 10 MG PO TABS
10.0000 mg | ORAL_TABLET | Freq: Every day | ORAL | Status: DC
  Administered 2016-06-27 – 2016-07-02 (×6): 10 mg via ORAL
  Filled 2016-06-26 (×6): qty 1

## 2016-06-26 MED ORDER — INSULIN ASPART 100 UNIT/ML ~~LOC~~ SOLN
2.0000 [IU] | SUBCUTANEOUS | Status: DC
  Administered 2016-06-27: 2 [IU] via SUBCUTANEOUS
  Administered 2016-06-27: 6 [IU] via SUBCUTANEOUS
  Administered 2016-06-27 – 2016-06-28 (×2): 2 [IU] via SUBCUTANEOUS
  Administered 2016-06-28: 4 [IU] via SUBCUTANEOUS
  Administered 2016-06-28 (×2): 2 [IU] via SUBCUTANEOUS
  Administered 2016-06-28: 4 [IU] via SUBCUTANEOUS
  Administered 2016-06-29: 2 [IU] via SUBCUTANEOUS
  Administered 2016-06-29 – 2016-06-30 (×2): 4 [IU] via SUBCUTANEOUS

## 2016-06-26 MED ORDER — OXYCODONE-ACETAMINOPHEN 5-325 MG PO TABS
1.0000 | ORAL_TABLET | Freq: Four times a day (QID) | ORAL | Status: DC | PRN
  Administered 2016-06-27: 1 via ORAL
  Filled 2016-06-26: qty 1

## 2016-06-26 MED ORDER — ONDANSETRON HCL 4 MG PO TABS: 4.0000 mg | ORAL_TABLET | Freq: Four times a day (QID) | ORAL | Status: DC | PRN

## 2016-06-26 MED ORDER — FUROSEMIDE 10 MG/ML IJ SOLN: 80.0000 mg | Freq: Two times a day (BID) | INTRAMUSCULAR | Status: DC

## 2016-06-26 MED ORDER — ONDANSETRON HCL 4 MG/2ML IJ SOLN
4.0000 mg | Freq: Four times a day (QID) | INTRAMUSCULAR | Status: DC | PRN
  Administered 2016-06-26 – 2016-06-29 (×3): 4 mg via INTRAVENOUS
  Filled 2016-06-26 (×3): qty 2

## 2016-06-26 MED ORDER — SENNOSIDES-DOCUSATE SODIUM 8.6-50 MG PO TABS: 1.0000 | ORAL_TABLET | Freq: Every evening | ORAL | Status: DC | PRN

## 2016-06-26 MED ORDER — POTASSIUM CHLORIDE CRYS ER 20 MEQ PO TBCR
20.0000 meq | EXTENDED_RELEASE_TABLET | Freq: Once | ORAL | Status: AC
  Administered 2016-06-26: 20 meq via ORAL
  Filled 2016-06-26: qty 1

## 2016-06-26 MED ORDER — POTASSIUM CHLORIDE CRYS ER 20 MEQ PO TBCR
20.0000 meq | EXTENDED_RELEASE_TABLET | Freq: Every day | ORAL | Status: DC
  Administered 2016-06-26 – 2016-06-28 (×3): 20 meq via ORAL
  Filled 2016-06-26 (×3): qty 1

## 2016-06-26 MED ORDER — HYDRALAZINE HCL 100 MG PO TABS: 100.0000 mg | ORAL_TABLET | Freq: Three times a day (TID) | ORAL | Status: DC

## 2016-06-26 MED ORDER — CLONIDINE HCL 0.2 MG PO TABS: 0.3000 mg | ORAL_TABLET | Freq: Three times a day (TID) | ORAL | Status: DC

## 2016-06-26 MED ORDER — HYDRALAZINE HCL 50 MG PO TABS
100.0000 mg | ORAL_TABLET | Freq: Three times a day (TID) | ORAL | Status: DC
  Administered 2016-06-26 (×2): 100 mg via ORAL
  Filled 2016-06-26: qty 4
  Filled 2016-06-26: qty 2

## 2016-06-26 MED ORDER — SODIUM CHLORIDE 0.9 % IV SOLN: 250.0000 mL | INTRAVENOUS | Status: DC | PRN

## 2016-06-26 MED ORDER — METOPROLOL TARTRATE 5 MG/5ML IV SOLN
10.0000 mg | Freq: Once | INTRAVENOUS | Status: AC
  Administered 2016-06-26: 10 mg via INTRAVENOUS
  Filled 2016-06-26: qty 10

## 2016-06-26 MED ORDER — DEXTROSE 5 % IV SOLN
0.0000 ug/kg/min | INTRAVENOUS | Status: DC
  Administered 2016-06-26 (×2): 0.3 ug/kg/min via INTRAVENOUS
  Filled 2016-06-26: qty 2

## 2016-06-26 MED ORDER — CLONIDINE HCL 0.2 MG PO TABS
0.3000 mg | ORAL_TABLET | Freq: Once | ORAL | Status: AC
  Administered 2016-06-26: 17:00:00 0.3 mg via ORAL
  Filled 2016-06-26: qty 1

## 2016-06-26 MED ORDER — HYDROCODONE-ACETAMINOPHEN 5-325 MG PO TABS
1.0000 | ORAL_TABLET | ORAL | Status: DC | PRN
  Administered 2016-06-27: 1 via ORAL
  Filled 2016-06-26: qty 1

## 2016-06-26 MED ORDER — SPIRONOLACTONE 25 MG PO TABS
25.0000 mg | ORAL_TABLET | Freq: Every day | ORAL | Status: DC
  Administered 2016-06-26 – 2016-06-28 (×3): 25 mg via ORAL
  Filled 2016-06-26 (×3): qty 1

## 2016-06-26 MED ORDER — METOCLOPRAMIDE HCL 5 MG/ML IJ SOLN
10.0000 mg | Freq: Once | INTRAMUSCULAR | Status: AC
  Administered 2016-06-26: 10 mg via INTRAVENOUS
  Filled 2016-06-26: qty 2

## 2016-06-26 MED ORDER — COLCHICINE 0.6 MG PO TABS
0.6000 mg | ORAL_TABLET | Freq: Every day | ORAL | Status: DC
  Administered 2016-06-27 – 2016-06-29 (×3): 0.6 mg via ORAL
  Filled 2016-06-26 (×6): qty 1

## 2016-06-26 MED ORDER — NICARDIPINE HCL IN NACL 20-0.86 MG/200ML-% IV SOLN
3.0000 mg/h | INTRAVENOUS | Status: DC
  Administered 2016-06-27: 3 mg/h via INTRAVENOUS
  Administered 2016-06-27 (×2): 2.5 mg/h via INTRAVENOUS
  Administered 2016-06-27: 5 mg/h via INTRAVENOUS
  Administered 2016-06-28: 2.5 mg/h via INTRAVENOUS
  Filled 2016-06-26 (×5): qty 200

## 2016-06-26 MED ORDER — FOLIC ACID 1 MG PO TABS
1.0000 mg | ORAL_TABLET | Freq: Every day | ORAL | Status: DC
  Administered 2016-06-26 – 2016-07-02 (×7): 1 mg via ORAL
  Filled 2016-06-26 (×7): qty 1

## 2016-06-26 MED ORDER — ASPIRIN EC 81 MG PO TBEC
81.0000 mg | DELAYED_RELEASE_TABLET | Freq: Every day | ORAL | Status: DC
  Administered 2016-06-26 – 2016-07-02 (×7): 81 mg via ORAL
  Filled 2016-06-26 (×7): qty 1

## 2016-06-26 MED ORDER — FUROSEMIDE 10 MG/ML IJ SOLN
40.0000 mg | Freq: Once | INTRAMUSCULAR | Status: AC
  Administered 2016-06-26: 40 mg via INTRAVENOUS
  Filled 2016-06-26: qty 4

## 2016-06-26 MED ORDER — CARVEDILOL 6.25 MG PO TABS
6.2500 mg | ORAL_TABLET | Freq: Two times a day (BID) | ORAL | Status: DC
  Administered 2016-06-27: 6.25 mg via ORAL
  Filled 2016-06-26: qty 1

## 2016-06-26 MED ORDER — ALLOPURINOL 100 MG PO TABS
100.0000 mg | ORAL_TABLET | Freq: Every day | ORAL | Status: DC
  Administered 2016-06-26 – 2016-07-02 (×7): 100 mg via ORAL
  Filled 2016-06-26 (×7): qty 1

## 2016-06-26 MED ORDER — TORSEMIDE 20 MG PO TABS
40.0000 mg | ORAL_TABLET | Freq: Every day | ORAL | Status: DC
  Administered 2016-06-27 – 2016-06-28 (×2): 40 mg via ORAL
  Filled 2016-06-26 (×2): qty 2

## 2016-06-26 NOTE — ED Provider Notes (Signed)
[facility] DEPT Provider Note   CSN: [id number] Arrival date & time: 06/26/16  1010     History   Chief Complaint Chief Complaint  Patient presents with  . Hypertension  . Nausea    HPI [name] is a 46 y.o. female with past medical history hypertension, a-flutter, CHF, CKD, gout who presents today with chief complaint sudden onset, constant nausea since this morning. She states that she suddenly began feeling nauseous at rest and lightheaded "like I was going to pass out " earlier today and called EMS. EMS found her blood pressure to be 239/131. While en route to the hospital she began vomiting, nonbloody nonbilious. No recent sick contacts, no suspicious food intake recently.  Denies syncope. No medication changes. She is on eliquis due to her A-fib. She states she took her blood pressure medications (amlodipine, hydralazine, and clonidine)  last night but not this morning.   Of note, she has been having bilateral lower extremity edema, L >R for 1 week now. She states this "happens every once in a while ", and she treats this with "a fluid pill ". She denies pain. She also has gout in the MCP of her left first digit, which she says she manages with colchicine and allopurinol and is not bothering her at this time.   Denies HA, SOB, CP, abd pain, diahrrhea, or the is melena, hematochezia, neck or back pain.    The history is provided by the patient.    Past Medical History:  Diagnosis Date  . Anemia   . Atrial flutter ([place])   . Chronic diastolic heart failure ([place])    a. Amyloidosis work-up negative 2013  b. 12/07/2014 ECHO EF 55-60%. LA severely dilated. RV normal RA moderately dilated, Severe LVH.IVC dilated.   . CKD (chronic kidney disease) stage 3, GFR 30-59 ml/min   . Essential hypertension, benign   . Gout   . History of cardiac catheterization    a. ectatic coronaries without obstruction 2006 - [place]  . Hypertensive heart disease   . Morbid obesity ([place])   .  Persistent atrial fibrillation ([place])   . Pulmonary hypertension ([place])    Also she is in a February the history of the hypertension is not quite as high elevated no headache or increasing vomiting are not but pupils are right she rule out congestive heart failure as well Will take care of the cardiac rigidity and the soft tissue of she she's on she is 10 she has not yet edema right is is vomiting she is body habitus her cor: He is present she Patient Active Problem List   Diagnosis Date Noted  . Hypertensive emergency 06/26/2016  . Hypertensive urgency 06/26/2016  . Acute diastolic CHF (congestive heart failure) ([place])   . CKD (chronic kidney disease) stage 3, GFR 30-59 ml/min   . Chronic diastolic heart failure ([place]) 02/06/2012  . HTN (hypertension) 02/06/2012  . Anemia 12/27/2011  . Morbid obesity with BMI of 50.0-59.9, adult ([place]) 12/24/2011  . Pulmonary hypertension ([place]) 12/15/2011  . Cellulitis of left leg 12/15/2011  . Anasarca 12/10/2011  . Accelerated hypertension 12/10/2011  . Elevated serum creatinine 12/10/2011  . Hyperbilirubinemia 12/10/2011  . Gout attack 12/10/2011  . Atrial fibrillation with controlled ventricular response ([place]) 12/10/2011  . Morbid obesity ([place]) 12/10/2011  . Acquired trigger finger 12/12/2010  . LATERAL EPICONDYLITIS 06/06/2009  . BURSITIS, OLECRANON 12/10/2007    Past Surgical History:  Procedure Laterality Date  . CARDIOVERSION  01/15/2012  Procedure: CARDIOVERSION;  Surgeon: Jolaine Artist, MD;  Location: [facility] ENDOSCOPY;  Service: Cardiovascular;  Laterality: N/A;  . RIGHT HEART CATHETERIZATION N/A 12/26/2011   Procedure: RIGHT HEART CATH;  Surgeon: Jolaine Artist, MD;  Location: [facility] CATH LAB;  Service: Cardiovascular;  Laterality: N/A;  . SKIN BIOPSY  12/18/2011   Procedure: BIOPSY SKIN;  Surgeon: Donato Heinz, MD;  Location: [facility] ORS;  Service: General;  Laterality: N/A;  in the minor room.  . TEE WITHOUT CARDIOVERSION  01/15/2012    Procedure: TRANSESOPHAGEAL ECHOCARDIOGRAM (TEE);  Surgeon: Jolaine Artist, MD;  Location: [facility] ENDOSCOPY;  Service: Cardiovascular;  Laterality: N/A;  cardioversion/on xarelto    OB History    Gravida Para Term Preterm AB Living             0   SAB TAB Ectopic Multiple Live Births                   Home Medications    Prior to Admission medications   Medication Sig Start Date End Date Taking? Authorizing Provider  acetaminophen (TYLENOL) 325 MG tablet Take 650 mg by mouth every 6 (six) hours as needed for mild pain or headache.   Yes Historical Provider, MD  allopurinol (ZYLOPRIM) 100 MG tablet Take 100 mg by mouth daily.  01/04/12  Yes Theodis Blaze, MD  amLODipine (NORVASC) 10 MG tablet Take 1 tablet (10 mg total) by mouth daily. 12/20/11  Yes Nimish Luther Parody, MD  apixaban (ELIQUIS) 5 MG TABS tablet Take 5 mg by mouth 2 (two) times daily.   Yes Historical Provider, MD  carvedilol (COREG) 6.25 MG tablet Take 1 tablet (6.25 mg total) by mouth 2 (two) times daily with a meal. 09/27/15  Yes Amy D Clegg, NP  cloNIDine (CATAPRES) 0.2 MG tablet Take 2 tablets (0.4 mg total) by mouth 3 (three) times daily. May take an additional tab prn for SBP>180 11/01/15  Yes Shirley Friar, PA-C  colchicine 0.6 MG tablet Take 0.6 mg by mouth daily as needed (gouty flare).   Yes Historical Provider, MD  folic acid (FOLVITE) 1 MG tablet Take 1 tablet (1 mg total) by mouth daily. 12/20/11  Yes Nimish Luther Parody, MD  hydrALAZINE (APRESOLINE) 100 MG tablet Take 1 tablet (100 mg total) by mouth 3 (three) times daily. 12/09/15  Yes Jolaine Artist, MD  loratadine (CLARITIN) 10 MG tablet Take 10 mg by mouth daily.   Yes Historical Provider, MD  oxyCODONE-acetaminophen (PERCOCET/ROXICET) 5-325 MG tablet Take 1 tablet by mouth every 6 (six) hours as needed for severe pain. 01/12/16  Yes Shary Decamp, PA-C  pantoprazole (PROTONIX) 40 MG tablet Take 1 tablet (40 mg total) by mouth daily. 12/20/11  Yes Nimish Luther Parody, MD  potassium chloride SA (K-DUR,KLOR-CON) 20 MEQ tablet Take 1 tablet (20 mEq total) by mouth daily. 12/12/14  Yes Eileen Stanford, PA-C  spironolactone (ALDACTONE) 25 MG tablet Take 1 tablet (25 mg total) by mouth at bedtime. 05/17/16 08/15/16 Yes Shirley Friar, PA-C  torsemide (DEMADEX) 20 MG tablet Take 40 mg by mouth daily. Take an additional 20 mg (1 tablet) in the afternoon for swelling in the legs or shortness of breath   Yes Historical Provider, MD    Family History Family History  Problem Relation Age of Onset  . Hypertension Mother   . Diabetes Mother     Social History Social History  Substance Use Topics  . Smoking status: Never Smoker  .  Smokeless tobacco: Never Used  . Alcohol use No     Allergies   Penicillins   Review of Systems Review of Systems  Constitutional: Negative for chills and fever.  Respiratory: Negative for shortness of breath.   Cardiovascular: Positive for leg swelling. Negative for chest pain.  Gastrointestinal: Positive for nausea and vomiting. Negative for abdominal pain, blood in stool and diarrhea.  Genitourinary: Negative for dysuria and hematuria.  Musculoskeletal: Positive for joint swelling. Negative for back pain and neck pain.  Neurological: Positive for light-headedness. Negative for syncope, numbness and headaches.  Psychiatric/Behavioral: Negative for confusion.     Physical Exam Updated Vital Signs BP (!) 207/117   Pulse 89   Temp 97.6 F (36.4 C) (Oral)   Resp 19   Ht 5\' 10"  (1.778 m)   Wt (!) 136.2 kg   SpO2 98%   BMI 43.07 kg/m   Physical Exam  Constitutional: She is oriented to person, place, and time. She appears well-developed and well-nourished. No distress.  HENT:  Head: Normocephalic and atraumatic.  Eyes: Conjunctivae and EOM are normal. Pupils are equal, round, and reactive to light. Right eye exhibits no discharge. Left eye exhibits no discharge. No scleral icterus.  The papilledema  or flame hemorrhages seen on funduscopy bilaterally  Neck: Normal range of motion. Neck supple. No JVD present. No tracheal deviation present.  Cardiovascular: Normal rate and intact distal pulses.   Irregularly irregular rhythm, 2+ radial and DP/PT pulses bl, negative Homan's bl   Pulmonary/Chest: Effort normal and breath sounds normal. She exhibits no tenderness.  Abdominal: Soft. Bowel sounds are normal. She exhibits no distension. There is no tenderness.  Musculoskeletal: She exhibits edema. She exhibits no tenderness.  Left hand with swelling of the first digit, full range of motion. BLE edema L >R, Full ROM of bilateral ankles  Neurological: She is alert and oriented to person, place, and time. No cranial nerve deficit or sensory deficit.  Fluent speech, no facial droop, sensation intact globally   Skin: Skin is warm and dry. Capillary refill takes less than 2 seconds. She is not diaphoretic.  Psychiatric: She has a normal mood and affect. Her behavior is normal.     ED Treatments / Results  Labs (all labs ordered are listed, but only abnormal results are displayed) Labs Reviewed  CBC - Abnormal; Notable for the following:       Result Value   WBC 19.3 (*)    All other components within normal limits  COMPREHENSIVE METABOLIC PANEL - Abnormal; Notable for the following:    Potassium 3.3 (*)    CO2 21 (*)    Glucose, Bld 151 (*)    BUN 28 (*)    Creatinine, Ser 1.85 (*)    Total Protein 9.7 (*)    Albumin 3.4 (*)    ALT 9 (*)    GFR calc non Af Amer 32 (*)    GFR calc Af Amer 37 (*)    All other components within normal limits  BRAIN NATRIURETIC PEPTIDE - Abnormal; Notable for the following:    B Natriuretic Peptide 607.5 (*)    All other components within normal limits  URIC ACID - Abnormal; Notable for the following:    Uric Acid, Serum 9.0 (*)    All other components within normal limits  CULTURE, BLOOD (ROUTINE X 2)  CULTURE, BLOOD (ROUTINE X 2)  CULTURE,  EXPECTORATED SPUTUM-ASSESSMENT  RESPIRATORY PANEL BY PCR  URINE CULTURE  LIPASE, BLOOD  TSH  LACTIC ACID, PLASMA  HEMOGLOBIN A1C  COMPREHENSIVE METABOLIC PANEL  CBC  RAPID URINE DRUG SCREEN, HOSP PERFORMED  MAGNESIUM  PHOSPHORUS  HIV ANTIBODY (ROUTINE TESTING)  LACTIC ACID, PLASMA  PROCALCITONIN  PROCALCITONIN  URINALYSIS, ROUTINE W REFLEX MICROSCOPIC  AMYLASE  STREP PNEUMONIAE URINARY ANTIGEN  LEGIONELLA PNEUMOPHILA SEROGP 1 UR AG  TROPONIN I  TROPONIN I  TROPONIN I  I-STAT TROPOININ, ED    EKG  EKG Interpretation  Date/Time:  Tuesday Jun 26 2016 10:14:18 EDT Ventricular Rate:  73 PR Interval:    QRS Duration: 128 QT Interval:  422 QTC Calculation: 465 R Axis:   -50 Text Interpretation:  Atrial fibrillation Ventricular premature complex Nonspecific IVCD with LAD Left ventricular hypertrophy Abnormal T, consider ischemia, lateral leads Otherwise no significant change Confirmed by [facility] MD, PEDRO ([id number]) on 06/26/2016 11:55:36 AM       Radiology Dg Chest 2 View  Result Date: 06/26/2016 CLINICAL DATA:  Nausea and vomiting this morning. EXAM: CHEST  2 VIEW COMPARISON:  PA and lateral chest 12/17/2014. FINDINGS: There is cardiomegaly and pulmonary vascular congestion. No consolidative process, pneumothorax or effusion. No focal bony abnormality. IMPRESSION: Cardiomegaly and pulmonary vascular congestion. Electronically Signed   By: Inge Rise M.D.   On: 06/26/2016 11:09    Procedures Procedures (including critical care time)  Medications Ordered in ED Medications  nitroPRUSSide (NIPRIDE) 50 mg in dextrose 5 % 250 mL (0.2 mg/mL) infusion (0.3 mcg/kg/min  137.9 kg Intravenous New Bag/Given 06/26/16 1928)  spironolactone (ALDACTONE) tablet 25 mg (not administered)  oxyCODONE-acetaminophen (PERCOCET/ROXICET) 5-325 MG per tablet 1 tablet (not administered)  cloNIDine (CATAPRES) tablet 0.3 mg (not administered)  carvedilol (COREG) tablet 6.25 mg (not administered)    colchicine tablet 0.6 mg (not administered)  potassium chloride SA (K-DUR,KLOR-CON) CR tablet 20 mEq (20 mEq Oral Given 06/26/16 1759)  apixaban (ELIQUIS) tablet 5 mg (not administered)  allopurinol (ZYLOPRIM) tablet 100 mg (not administered)  amLODipine (NORVASC) tablet 10 mg (not administered)  folic acid (FOLVITE) tablet 1 mg (1 mg Oral Given 06/26/16 1759)  pantoprazole (PROTONIX) EC tablet 40 mg (40 mg Oral Given 06/26/16 1758)  acetaminophen (TYLENOL) tablet 650 mg (not administered)    Or  acetaminophen (TYLENOL) suppository 650 mg (not administered)  senna-docusate (Senokot-S) tablet 1 tablet (not administered)  bisacodyl (DULCOLAX) suppository 10 mg (not administered)  ondansetron (ZOFRAN) tablet 4 mg (not administered)    Or  ondansetron (ZOFRAN) injection 4 mg (not administered)  HYDROcodone-acetaminophen (NORCO/VICODIN) 5-325 MG per tablet 1-2 tablet (not administered)  hydrALAZINE (APRESOLINE) tablet 100 mg (100 mg Oral Given 06/26/16 1758)  0.9 %  sodium chloride infusion (not administered)  insulin aspart (novoLOG) injection 2-6 Units (not administered)  furosemide (LASIX) injection 40 mg (not administered)  potassium chloride SA (K-DUR,KLOR-CON) CR tablet 20 mEq (not administered)  torsemide (DEMADEX) tablet 40 mg (not administered)  metoCLOPramide (REGLAN) injection 10 mg (10 mg Intravenous Given 06/26/16 1114)  cloNIDine (CATAPRES) tablet 0.3 mg (0.3 mg Oral Given 06/26/16 1638)  hydrALAZINE (APRESOLINE) injection 5 mg (5 mg Intravenous Given 06/26/16 1641)  metoprolol (LOPRESSOR) injection 10 mg (10 mg Intravenous Given 06/26/16 1719)     Initial Impression / Assessment and Plan / ED Course  I have reviewed the triage vital signs and the nursing notes.  Pertinent labs & imaging results that were available during my care of the patient were reviewed by me and considered in my medical decision making (see chart for details).     Patient with acute onset  of nausea and vomiting  earlier today and BLE edema x1 week. Patient afebrile, hypertensive on arrival and throughout visit.  Leukocytosis of 19.3. Initial trop negative, EKG shows A-fib and T wave changes in the lateral leads. Chest x-ray shows cardiomegaly and pulmonary vascular congestion, but no pulmonary effusion or PNE. Low suspicion ACS/MI. Elevated serum Cr with known CKD. BNP elevated at 607.5. Reglan provided with significant relief of nausea. Initiated nitroprusside drip. Patient with hypertensive emergency with superimposed CHF exacerbation,consulted hospitalist who assumed care and will bring into the hospital for further management.    CRITICAL CARE Performed by: Elvina Mattes Total critical care time: 35 minutes Critical care time was exclusive of separately billable procedures and treating other patients. Critical care was necessary to treat or prevent imminent or life-threatening deterioration. Critical care was time spent personally by me on the following activities: development of treatment plan with patient and/or surrogate as well as nursing, discussions with consultants, evaluation of patient's response to treatment, examination of patient, obtaining history from patient or surrogate, ordering and performing treatments and interventions, ordering and review of laboratory studies, ordering and review of radiographic studies, pulse oximetry and re-evaluation of patient's condition.   Final Clinical Impressions(s) / ED Diagnoses   Final diagnoses:  Hypertensive emergency  Acute on chronic congestive heart failure, unspecified heart failure type [facility])    New Prescriptions Current Discharge Medication List       Renita Papa, PA-C 06/26/16 2115

## 2016-06-26 NOTE — ED Provider Notes (Signed)
Medical screening examination/treatment/procedure(s) were conducted as a shared visit with non-physician practitioner(s) and myself.  I personally evaluated the patient during the encounter. Briefly, the patient is a 46 y.o. female with a history of hypertension and CHF presents the ED with severe hypertension and volume overload. Also having nausea or vomiting. Workup significant for hypertensive emergency superimposed with CHF exacerbation. Patient started on a nitroglycerin drip and admitted to the hospitalist service for further management.  CRITICAL CARE Performed by: Grayce Sessions Darryle Dennie Total critical care time: 35 minutes Critical care time was exclusive of separately billable procedures and treating other patients. Critical care was necessary to treat or prevent imminent or life-threatening deterioration. Critical care was time spent personally by me on the following activities: development of treatment plan with patient and/or surrogate as well as nursing, discussions with consultants, evaluation of patient's response to treatment, examination of patient, obtaining history from patient or surrogate, ordering and performing treatments and interventions, ordering and review of laboratory studies, ordering and review of radiographic studies, pulse oximetry and re-evaluation of patient's condition.  .    EKG Interpretation  Date/Time:  Tuesday Jun 26 2016 10:14:18 EDT Ventricular Rate:  73 PR Interval:    QRS Duration: 128 QT Interval:  422 QTC Calculation: 465 R Axis:   -50 Text Interpretation:  Atrial fibrillation Ventricular premature complex Nonspecific IVCD with LAD Left ventricular hypertrophy Abnormal T, consider ischemia, lateral leads Otherwise no significant change Confirmed by [facility] MD, Casey Maxfield ([id number]) on 06/26/2016 11:55:36 AM           Fatima Blank, MD 06/26/16 6045

## 2016-06-26 NOTE — ED Triage Notes (Signed)
PT arrives via EMS for sudden onset nausea/vomiting that started within the last 30 minutes. PT is hypertensive. PT's BP with EMS is 239/131. PT denies chest pain, SOB. PT has vomited several times with EMS. PT was given 4mg  Zofran. PT takes clonidine, hydralazine, and norvasc for BP. PT last took her meds last night. PT has been compliant, but did not take her meds this AM

## 2016-06-26 NOTE — Progress Notes (Signed)
3w called regarding admit from ED.  Patient currently on Nipride gtt recommended ICU bed request since on vasoactive gtt.

## 2016-06-26 NOTE — ED Notes (Signed)
Dr. Marily Memos made aware.

## 2016-06-26 NOTE — ED Notes (Signed)
Dr. Marily Memos made aware of BP

## 2016-06-26 NOTE — ED Notes (Signed)
Ordered tray/heart healthy

## 2016-06-26 NOTE — ED Notes (Signed)
[place], [place] reports no BP meds at this time.

## 2016-06-26 NOTE — ED Notes (Signed)
Attempted report to 3W; PT's bed assignment is changing per 3W

## 2016-06-26 NOTE — ED Notes (Signed)
BP remains elevated. 3W charge aware. Pt not appropriate for unit.

## 2016-06-26 NOTE — ED Notes (Signed)
ICU NP at bedside.

## 2016-06-26 NOTE — ED Notes (Signed)
ED Provider at bedside. 

## 2016-06-26 NOTE — ED Notes (Signed)
Care handoff to Yasemia, RN 

## 2016-06-26 NOTE — ED Notes (Signed)
Hospitalist at bedside 

## 2016-06-26 NOTE — ED Notes (Signed)
Charge nurse 3W requests another BP check prior to admission to unit.

## 2016-06-26 NOTE — H&P (Signed)
History and Physical    [name] XNA:[id number] DOB: 1970-03-04 DOA: 06/26/2016   PCP: Maggie Font, MD   Patient coming from:  Home    Chief Complaint:  Elevated blood pressure and nausea  HPI: [name] is a 46 y.o. female with medical history significant for HTN, atrialflutter/afib, diastolic congestive heart failure, CAD, gout, CKD ,presenting with sudden onset of nausea and non bloody, non bilous vomiting while at home, and with EMS. She had been, compliant to her high blood pressure medications with amlodipine, hydralazine and clonidine  But did not take them this morning, . BP with EMS was 23 9/131. She denies any chest pain or palpitations. She denies any syncope or presyncope. Patient denies any headaches or confusion. She denies any abdominal pain, denies diarrhea, melena,  hematochezia, neck or back pain. Reports bilateral L>R lower extremity edema and has had it for about 1 week .     ED Course:  BP (!) 239/133   Pulse 95   Temp 97.6 F (36.4 C) (Oral)   Resp 20   Ht 5\' 10"  (1.778 m)   Wt (!) 137.9 kg (304 lb)   SpO2 98%   BMI 43.62 kg/m    BNP 607.5 Sodium 138 potassium 3.3 glucose 151 creatinine 1.85 at baseline, GFR 37 troponin 0.03 white count 19.3 very elevated when compared to prior labs with white count between 7 and 8 hemoglobin 12.3 platelets 250 K EKG Afib without significant change Echo  on October 2016 ejection fraction 60%,, severe LVH,   Review of Systems: As per HPI otherwise 10 point review of systems negative.    Past Medical History:  Diagnosis Date  . Anemia   . Atrial flutter ([place])   . Chronic diastolic heart failure ([place])    a. Amyloidosis work-up negative 2013  b. 12/07/2014 ECHO EF 55-60%. LA severely dilated. RV normal RA moderately dilated, Severe LVH.IVC dilated.   . CKD (chronic kidney disease) stage 3, GFR 30-59 ml/min   . Essential hypertension, benign   . Gout   . History of cardiac catheterization    a. ectatic  coronaries without obstruction 2006 - [place]  . Hypertensive heart disease   . Morbid obesity ([place])   . Persistent atrial fibrillation ([place])   . Pulmonary hypertension ([place])     Past Surgical History:  Procedure Laterality Date  . CARDIOVERSION  01/15/2012   Procedure: CARDIOVERSION;  Surgeon: Jolaine Artist, MD;  Location: [facility] ENDOSCOPY;  Service: Cardiovascular;  Laterality: N/A;  . RIGHT HEART CATHETERIZATION N/A 12/26/2011   Procedure: RIGHT HEART CATH;  Surgeon: Jolaine Artist, MD;  Location: [facility] CATH LAB;  Service: Cardiovascular;  Laterality: N/A;  . SKIN BIOPSY  12/18/2011   Procedure: BIOPSY SKIN;  Surgeon: Donato Heinz, MD;  Location: [facility] ORS;  Service: General;  Laterality: N/A;  in the minor room.  . TEE WITHOUT CARDIOVERSION  01/15/2012   Procedure: TRANSESOPHAGEAL ECHOCARDIOGRAM (TEE);  Surgeon: Jolaine Artist, MD;  Location: [facility] ENDOSCOPY;  Service: Cardiovascular;  Laterality: N/A;  cardioversion/on xarelto    Social History Social History   Social History  . Marital status: Single    Spouse name: N/A  . Number of children: N/A  . Years of education: N/A   Occupational History  . Not on file.   Social History Main Topics  . Smoking status: Never Smoker  . Smokeless tobacco: Never Used  . Alcohol use No  . Drug use: No  . Sexual  activity: Not on file   Other Topics Concern  . Not on file   Social History Narrative  . No narrative on file     Allergies  Allergen Reactions  . Penicillins Hives        Family History  Problem Relation Age of Onset  . Hypertension Mother   . Diabetes Mother       Prior to Admission medications   Medication Sig Start Date End Date Taking? Authorizing Provider  acetaminophen (TYLENOL) 325 MG tablet Take 650 mg by mouth every 6 (six) hours as needed for mild pain or headache.   Yes Historical Provider, MD  allopurinol (ZYLOPRIM) 100 MG tablet Take 100 mg by mouth daily.  01/04/12  Yes Theodis Blaze, MD    amLODipine (NORVASC) 10 MG tablet Take 1 tablet (10 mg total) by mouth daily. 12/20/11  Yes Nimish Luther Parody, MD  apixaban (ELIQUIS) 5 MG TABS tablet Take 5 mg by mouth 2 (two) times daily.   Yes Historical Provider, MD  carvedilol (COREG) 6.25 MG tablet Take 1 tablet (6.25 mg total) by mouth 2 (two) times daily with a meal. 09/27/15  Yes Amy D Clegg, NP  cloNIDine (CATAPRES) 0.2 MG tablet Take 2 tablets (0.4 mg total) by mouth 3 (three) times daily. May take an additional tab prn for SBP>180 11/01/15  Yes Shirley Friar, PA-C  colchicine 0.6 MG tablet Take 0.6 mg by mouth daily as needed (gouty flare).   Yes Historical Provider, MD  folic acid (FOLVITE) 1 MG tablet Take 1 tablet (1 mg total) by mouth daily. 12/20/11  Yes Nimish Luther Parody, MD  hydrALAZINE (APRESOLINE) 100 MG tablet Take 1 tablet (100 mg total) by mouth 3 (three) times daily. 12/09/15  Yes Jolaine Artist, MD  loratadine (CLARITIN) 10 MG tablet Take 10 mg by mouth daily.   Yes Historical Provider, MD  oxyCODONE-acetaminophen (PERCOCET/ROXICET) 5-325 MG tablet Take 1 tablet by mouth every 6 (six) hours as needed for severe pain. 01/12/16  Yes Shary Decamp, PA-C  pantoprazole (PROTONIX) 40 MG tablet Take 1 tablet (40 mg total) by mouth daily. 12/20/11  Yes Nimish Luther Parody, MD  potassium chloride SA (K-DUR,KLOR-CON) 20 MEQ tablet Take 1 tablet (20 mEq total) by mouth daily. 12/12/14  Yes Eileen Stanford, PA-C  spironolactone (ALDACTONE) 25 MG tablet Take 1 tablet (25 mg total) by mouth at bedtime. 05/17/16 08/15/16 Yes Shirley Friar, PA-C  torsemide (DEMADEX) 20 MG tablet Take 40 mg by mouth daily. Take an additional 20 mg (1 tablet) in the afternoon for swelling in the legs or shortness of breath   Yes Historical Provider, MD    Physical Exam:  Vitals:   06/26/16 1500 06/26/16 1545 06/26/16 1625 06/26/16 1638  BP: (!) 165/96 (!) 206/123 (!) 164/134 (!) 239/133  Pulse: 94 93 95   Resp: (!) 22 (!) 23 20   Temp:       TempSrc:      SpO2: 95% 98% 98%   Weight:      Height:       Constitutional: NAD, calm, uncomfortable appearing, anxious  Eyes: PERRL, lids and conjunctivae normal ENMT: Mucous membranes are moist, without exudate or lesions  Neck: normal, supple, no masses Respiratory: clear to auscultation bilaterally, no wheezing, no crackles. Normal respiratory effort  Cardiovascular: IRR and rhythm, no murmurs, rubs or gallops. No extremity edema. 2+ pedal pulses. No carotid bruits.  Abdomen:  Obese, Soft, non tender, No hepatosplenomegaly. Bowel sounds  positive.  Musculoskeletal: no clubbing / cyanosis. Moves all extremities Left hand with swelling of 1st digit with Full ROM, patient states she is having a gout episode.  Skin: no jaundice, No lesions.  Neurologic: Sensation intact  Strength equal in all extremities Psychiatric:   Alert and oriented x 3. Anxious mood    Labs on Admission: I have personally reviewed following labs and imaging studies  CBC:  Recent Labs Lab 06/26/16 1042  WBC 19.3*  HGB 12.3  HCT 37.4  MCV 90.8  PLT 956    Basic Metabolic Panel:  Recent Labs Lab 06/26/16 1042  NA 138  K 3.3*  CL 106  CO2 21*  GLUCOSE 151*  BUN 28*  CREATININE 1.85*  CALCIUM 9.0    GFR: Estimated Creatinine Clearance: 59 mL/min (A) (by C-G formula based on SCr of 1.85 mg/dL (H)).  Liver Function Tests:  Recent Labs Lab 06/26/16 1042  AST 16  ALT 9*  ALKPHOS 101  BILITOT 1.2  PROT 9.7*  ALBUMIN 3.4*    Recent Labs Lab 06/26/16 1042  LIPASE 21   No results for input(s): AMMONIA in the last 168 hours.  Coagulation Profile: No results for input(s): INR, PROTIME in the last 168 hours.  Cardiac Enzymes: No results for input(s): CKTOTAL, CKMB, CKMBINDEX, TROPONINI in the last 168 hours.  BNP (last 3 results) No results for input(s): PROBNP in the last 8760 hours.  HbA1C: No results for input(s): HGBA1C in the last 72 hours.  CBG: No results for input(s):  GLUCAP in the last 168 hours.  Lipid Profile: No results for input(s): CHOL, HDL, LDLCALC, TRIG, CHOLHDL, LDLDIRECT in the last 72 hours.  Thyroid Function Tests: No results for input(s): TSH, T4TOTAL, FREET4, T3FREE, THYROIDAB in the last 72 hours.  Anemia Panel: No results for input(s): VITAMINB12, FOLATE, FERRITIN, TIBC, IRON, RETICCTPCT in the last 72 hours.  Urine analysis:    Component Value Date/Time   COLORURINE STRAW (A) 12/07/2014 0014   APPEARANCEUR CLEAR 12/07/2014 0014   LABSPEC 1.010 12/07/2014 0014   PHURINE 6.0 12/07/2014 0014   GLUCOSEU NEGATIVE 12/07/2014 0014   HGBUR NEGATIVE 12/07/2014 0014   BILIRUBINUR NEGATIVE 12/07/2014 0014   KETONESUR NEGATIVE 12/07/2014 0014   PROTEINUR NEGATIVE 12/07/2014 0014   UROBILINOGEN 0.2 12/07/2014 0014   NITRITE NEGATIVE 12/07/2014 0014   LEUKOCYTESUR NEGATIVE 12/07/2014 0014    Sepsis Labs: @LABRCNTIP (procalcitonin:4,lacticidven:4) )No results found for this or any previous visit (from the past 240 hour(s)).   Radiological Exams on Admission: Dg Chest 2 View  Result Date: 06/26/2016 CLINICAL DATA:  Nausea and vomiting this morning. EXAM: CHEST  2 VIEW COMPARISON:  PA and lateral chest 12/17/2014. FINDINGS: There is cardiomegaly and pulmonary vascular congestion. No consolidative process, pneumothorax or effusion. No focal bony abnormality. IMPRESSION: Cardiomegaly and pulmonary vascular congestion. Electronically Signed   By: Inge Rise M.D.   On: 06/26/2016 11:09    EKG: Independently reviewed.  Assessment/Plan Active Problems:   Hypertensive emergency   Accelerated hypertension   Atrial fibrillation with controlled ventricular response (HCC)   Morbid obesity ([place])   Pulmonary hypertension (HCC)   Morbid obesity with BMI of 50.0-59.9, adult (HCC)   Anemia   Chronic diastolic heart failure (HCC)   HTN (hypertension)   CKD (chronic kidney disease) stage 3, GFR 30-59 ml/min   Hypertensive emergency in the  setting of end organ damage with evidence of heart strain Prior to admission was highest at 23 9/131 , currently between 200s/100s  Patient  was compliant with her medications but did not take any prior to hospitalization. Received NTG drip a the ED without significant improvement.. Receiving Catapres and hydralazine IV x1 at the ED     stepdown observation Continue home meds  Check serial Troponin   Resume her Hydralazine 100 mg tid oral, starting now  Tylenol for headaches     Acute on Chronic  diastolic CHF in the setting of hypertensive urgency   CXR with cardiomegaly with acute vascular congestion  BNP elevated at 607.5. Echo  on October 2016 ejection fraction 60%,, severe LVH. Weight 304 lbs (was in the 340s recently)  continue Coreg Lasix 80 mg bid, d/c torsemide monitor I/Os and daily weights prn 02 2 D echo  TSH   Nausea and vomiting. Likely  Due to hypertensive urgency  Afebrile  Received Reglan x1. No recent GI viral illness, no diarrhea  Zofran prn  Advance diet as tolerated and    Hyperlipidemia Continue home statins    GERD, no acute symptoms Continue PPI  Leukocytosis, likely reactive, related to  Pain, gout flare, inflammation. WBC 19 Afebrile .Clood culture  pending  Repeat CBC in AM  Acute gout flare: Patient w/ typical signs and symptoms of an acute gout flare at the eft index finger . Afebrile WBC  Elevated at 19  Patient was on allopurinol, will start colchicine CBC in AM  Check uric acid (last in 2016 at 9.8)  Chronic kidney disease stage  III  baseline creatinine    Current Cr 1.85 at baseline Lab Results  Component Value Date   CREATININE 1.85 (H) 06/26/2016   CREATININE 1.76 (H) 05/17/2016   CREATININE 1.93 (H) 09/27/2015  Repeat CMET in am   Atrial Fibrillation CHA2DS2-VASc score 3-4, on anticoagulation with ELiquis. EKG Afib without ACS   Rate controlled Continue meds    DVT prophylaxis: Eliquis  Code Status:   Full    Family  Communication:  Discussed with patient Disposition Plan: Expect patient to be discharged to home after condition improves Consults called:    none Admission status:Tele  Obs SDU    [facility] E, PA-C [facility]   06/26/2016, 4:56 PM

## 2016-06-26 NOTE — Progress Notes (Signed)
Critical Troponin 0.08. Called E-Link and reported to Dr. Oletta Darter

## 2016-06-26 NOTE — Progress Notes (Signed)
eLink Physician-Brief Progress Note Patient Name: [name] DOB: 1970/10/26 MRN: [id number]   Date of Service  06/26/2016  HPI/Events of Note  Troponin = 0.08 - EKG earlier today - Atrial fibrillation. Rate = 73. Ventricular premature complex. Nonspecific IVCD with LAD.  Left ventricular hypertrophy.  Abnormal T, consider ischemia, lateral leads. Otherwise no significant change. Already on Metoprolol and Eliquis. 2D cardiac echo pending.   eICU Interventions  Will order: 1. ASA 81 mg PO now and Q day.  2. Continue to trend Troponin.      Intervention Category Intermediate Interventions: Diagnostic test evaluation  Lysle Dingwall 06/26/2016, 9:41 PM

## 2016-06-26 NOTE — ED Notes (Signed)
This nurse calls bed control, bed control reports that rapid response nurse reported to bed control that PT will need to be an ICU admission and that MD would be paged. Bed control waiting for order for ICU admission. Dr. Marily Memos made aware

## 2016-06-26 NOTE — ED Notes (Signed)
Systolic BP goal 505 per [place], [place]

## 2016-06-26 NOTE — ED Notes (Signed)
PT returns from XR

## 2016-06-26 NOTE — Consult Note (Signed)
PULMONARY / CRITICAL CARE MEDICINE   Name: [name] MRN: [id number] DOB: 06-08-1970    ADMISSION DATE:  06/26/2016 CONSULTATION DATE:  06/26/2016  REFERRING MD:  Dr. Shawn Route  CHIEF COMPLAINT:  HTN Urgency   HISTORY OF PRESENT ILLNESS:   46 year old female with PMH of anemia, gout, A.Flutter (on Eliquis), Diastolic HF, CKD Stage 3, HTN, Gout, Morbid Obesity, and Pulmonary HTN.   Presents to ED on 5/1 with EMS with sudden onset of nausea/vomiting. Per EMS BP 239/131 and patient had several episodes of emesis and was given 4 mg Zofran. Upon arrival to ED patient denies chest pain and dyspnea, reports cough with yellow sputum production and increases swelling of bilateral lower extremities for the last few days, states chronic swelling to left lower extremity. Patient reports she took BP medications last night, however did not take this morning. In ED patient was given hydralazine, clonidine, and metoprolol without improvement - requiring nipride gtt. WBC 19.3, K 3.3, BNP 607.5, chest xray revealed cardiomegaly with pulmonary vascular congestion. PCCM asked to admit.   Admitted 12/06/14-12/12/14 for Acute on Chronic HF. Diuresed 100 lbs.   PAST MEDICAL HISTORY :  She  has a past medical history of Anemia; Atrial flutter ([place]); Chronic diastolic heart failure (HCC); CKD (chronic kidney disease) stage 3, GFR 30-59 ml/min; Essential hypertension, benign; Gout; History of cardiac catheterization; Hypertensive heart disease; Morbid obesity ([place]); Persistent atrial fibrillation ([place]); and Pulmonary hypertension ([place]).  PAST SURGICAL HISTORY: She  has a past surgical history that includes Skin biopsy (12/18/2011); TEE without cardioversion (01/15/2012); Cardioversion (01/15/2012); and right heart catheterization (N/A, 12/26/2011).  Allergies  Allergen Reactions  . Penicillins Hives    Childhood allergy. Has patient had a PCN reaction causing immediate rash, facial/tongue/throat swelling, SOB or  lightheadedness with hypotension:  NO Has patient had a PCN reaction causing severe rash involving mucus membranes or skin necrosis: NO Has patient had a PCN reaction that required hospitalization  NO Has patient had a PCN reaction occurring within the last 10 years: NO If all of the above answers are "NO", then may pro    No current facility-administered medications on file prior to encounter.    Current Outpatient Prescriptions on File Prior to Encounter  Medication Sig  . acetaminophen (TYLENOL) 325 MG tablet Take 650 mg by mouth every 6 (six) hours as needed for mild pain or headache.  . allopurinol (ZYLOPRIM) 100 MG tablet Take 100 mg by mouth daily.   Marland Kitchen amLODipine (NORVASC) 10 MG tablet Take 1 tablet (10 mg total) by mouth daily.  Marland Kitchen apixaban (ELIQUIS) 5 MG TABS tablet Take 5 mg by mouth 2 (two) times daily.  . carvedilol (COREG) 6.25 MG tablet Take 1 tablet (6.25 mg total) by mouth 2 (two) times daily with a meal.  . cloNIDine (CATAPRES) 0.2 MG tablet Take 2 tablets (0.4 mg total) by mouth 3 (three) times daily. May take an additional tab prn for SBP>180  . colchicine 0.6 MG tablet Take 0.6 mg by mouth daily as needed (gouty flare).  . folic acid (FOLVITE) 1 MG tablet Take 1 tablet (1 mg total) by mouth daily.  . hydrALAZINE (APRESOLINE) 100 MG tablet Take 1 tablet (100 mg total) by mouth 3 (three) times daily.  Marland Kitchen loratadine (CLARITIN) 10 MG tablet Take 10 mg by mouth daily.  Marland Kitchen oxyCODONE-acetaminophen (PERCOCET/ROXICET) 5-325 MG tablet Take 1 tablet by mouth every 6 (six) hours as needed for severe pain.  . pantoprazole (PROTONIX) 40 MG tablet  Take 1 tablet (40 mg total) by mouth daily.  . potassium chloride SA (K-DUR,KLOR-CON) 20 MEQ tablet Take 1 tablet (20 mEq total) by mouth daily.  Marland Kitchen spironolactone (ALDACTONE) 25 MG tablet Take 1 tablet (25 mg total) by mouth at bedtime.  . torsemide (DEMADEX) 20 MG tablet Take 40 mg by mouth daily. Take an additional 20 mg (1 tablet) in the  afternoon for swelling in the legs or shortness of breath    FAMILY HISTORY:  Her indicated that the status of her mother is unknown.    SOCIAL HISTORY: She  reports that she has never smoked. She has never used smokeless tobacco. She reports that she does not drink alcohol or use drugs.  REVIEW OF SYSTEMS:   All negative; except for those that are bolded, which indicate positives.  Constitutional: weight loss, weight gain, night sweats, fevers, chills, fatigue, weakness.  HEENT: headaches, sore throat, sneezing, nasal congestion, post nasal drip, difficulty swallowing, tooth/dental problems, visual complaints, visual changes, ear aches. Neuro: difficulty with speech, weakness, numbness, ataxia. CV:  chest pain, orthopnea, PND, swelling in lower extremities, dizziness, palpitations, syncope.  Resp: cough, hemoptysis, dyspnea, wheezing. GI: heartburn, indigestion, abdominal pain, nausea, vomiting, diarrhea, constipation, change in bowel habits, loss of appetite, hematemesis, melena, hematochezia.  GU: dysuria, change in color of urine, urgency or frequency, flank pain, hematuria. MSK: joint pain or swelling, decreased range of motion. Psych: change in mood or affect, depression, anxiety, suicidal ideations, homicidal ideations. Skin: rash, itching, bruising.   SUBJECTIVE:  Denis pain, reports increased swelling to lower extremities and cough with yellow sputum production    VITAL SIGNS: BP (!) 207/117   Pulse 89   Temp 97.6 F (36.4 C) (Oral)   Resp 19   Ht 5\' 10"  (1.778 m)   Wt (!) 137.9 kg (304 lb)   SpO2 98%   BMI 43.62 kg/m   HEMODYNAMICS:    VENTILATOR SETTINGS:    INTAKE / OUTPUT: No intake/output data recorded.  PHYSICAL EXAMINATION: General:  Adult female, no distress  Neuro:  Alert, oriented, follows commands  HEENT:  Normocephalic  Cardiovascular:  Irregular, A.Fib, no MRG Lungs:  Diminished to bases, faint crackles, no wheezes, non-labored  Abdomen:   Obese, active bowel sounds  Musculoskeletal:  +2 to RLE, +3 to LLE Skin:  Warm. Dry. Intact   LABS:  BMET  Recent Labs Lab 06/26/16 1042  NA 138  K 3.3*  CL 106  CO2 21*  BUN 28*  CREATININE 1.85*  GLUCOSE 151*    Electrolytes  Recent Labs Lab 06/26/16 1042  CALCIUM 9.0    CBC  Recent Labs Lab 06/26/16 1042  WBC 19.3*  HGB 12.3  HCT 37.4  PLT 250    Coag's No results for input(s): APTT, INR in the last 168 hours.  Sepsis Markers No results for input(s): LATICACIDVEN, PROCALCITON, O2SATVEN in the last 168 hours.  ABG No results for input(s): PHART, PCO2ART, PO2ART in the last 168 hours.  Liver Enzymes  Recent Labs Lab 06/26/16 1042  AST 16  ALT 9*  ALKPHOS 101  BILITOT 1.2  ALBUMIN 3.4*    Cardiac Enzymes No results for input(s): TROPONINI, PROBNP in the last 168 hours.  Glucose No results for input(s): GLUCAP in the last 168 hours.  Imaging Dg Chest 2 View  Result Date: 06/26/2016 CLINICAL DATA:  Nausea and vomiting this morning. EXAM: CHEST  2 VIEW COMPARISON:  PA and lateral chest 12/17/2014. FINDINGS: There is cardiomegaly and pulmonary vascular  congestion. No consolidative process, pneumothorax or effusion. No focal bony abnormality. IMPRESSION: Cardiomegaly and pulmonary vascular congestion. Electronically Signed   By: Inge Rise M.D.   On: 06/26/2016 11:09     STUDIES:  CXR 5/1 > Cardiomegaly and pulmonary vascular congestion  ECHO 5/1 >>   CULTURES: Blood 5/1 >> Sputum 5/1 >> Urine 5/1 >> Urinalysis 5/1 >> RVP 5/1 >>   ANTIBIOTICS: None.   SIGNIFICANT EVENTS: 5/1 > Presents to ED   LINES/TUBES: PIV   DISCUSSION: 46 year old female with uncontrolled HTN and Diastolic HF presents to ED with BP 239/131, non-responsive with clonidine, hydralazine, and metoprolol - requiring Nipride gtt.   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)  H/O A.Fib/A/Flutter (on Eliquis), HTN,   P:  Cardiac Monitoring  Continue home Norvasc, Coreg, Clonidine, and Hydralazine Continue home aldactone   Wean Nipride gtt to maintain systolic goal of 109 (on arrival 239/131, patient reports baseline 160) Continue home torsemide  Lasix 40 meq x 1   RENAL A:   Hypokalemia  Chronic Renal Diease Stage 3  P:   Trend BMP Replace Electrolytes as needed  Continue home dose potassium > give one extra 20 meq now   GASTROINTESTINAL A:   GERD Nausea secondary to increased CNS pressure vs viral gastroenteritis  P:   PPI Heart Healthy diet  PRN Zofran   HEMATOLOGIC A:   Anemia of chronic diease  P:  Trend CBC Maintain Hbg >7   INFECTIOUS A:   Leukocytosis  >+Sputum production  P:   Trend Fever and WBC curve Trend Lactic Acid and Procal  Follow Culture Data  RVP Pending   ENDOCRINE A:   Hyperglycemia    P:   Trend Glucose  SSI  NEUROLOGIC A:   No issues  P:   RASS goal: 0 Monitor    FAMILY  - Updates: no family at bedside   - Inter-disciplinary family meet or Palliative Care meeting due by:  07/03/2016   CC Time: 45 minutes   Hayden Pedro, AG-ACNP [facility]  Pgr: [phone]  PCCM Pgr: [phone]

## 2016-06-27 ENCOUNTER — Inpatient Hospital Stay (HOSPITAL_COMMUNITY): Payer: Self-pay

## 2016-06-27 DIAGNOSIS — I10 Essential (primary) hypertension: Secondary | ICD-10-CM

## 2016-06-27 DIAGNOSIS — I161 Hypertensive emergency: Principal | ICD-10-CM

## 2016-06-27 DIAGNOSIS — I509 Heart failure, unspecified: Secondary | ICD-10-CM

## 2016-06-27 LAB — MAGNESIUM: MAGNESIUM: 1.8 mg/dL (ref 1.7–2.4)

## 2016-06-27 LAB — GLUCOSE, CAPILLARY
GLUCOSE-CAPILLARY: 108 mg/dL — AB (ref 65–99)
GLUCOSE-CAPILLARY: 149 mg/dL — AB (ref 65–99)
Glucose-Capillary: 125 mg/dL — ABNORMAL HIGH (ref 65–99)
Glucose-Capillary: 132 mg/dL — ABNORMAL HIGH (ref 65–99)
Glucose-Capillary: 137 mg/dL — ABNORMAL HIGH (ref 65–99)
Glucose-Capillary: 210 mg/dL — ABNORMAL HIGH (ref 65–99)

## 2016-06-27 LAB — COMPREHENSIVE METABOLIC PANEL
ALK PHOS: 87 U/L (ref 38–126)
ALT: 8 U/L — ABNORMAL LOW (ref 14–54)
ANION GAP: 7 (ref 5–15)
AST: 15 U/L (ref 15–41)
Albumin: 3.1 g/dL — ABNORMAL LOW (ref 3.5–5.0)
BUN: 25 mg/dL — ABNORMAL HIGH (ref 6–20)
CALCIUM: 8.8 mg/dL — AB (ref 8.9–10.3)
CO2: 24 mmol/L (ref 22–32)
Chloride: 108 mmol/L (ref 101–111)
Creatinine, Ser: 1.83 mg/dL — ABNORMAL HIGH (ref 0.44–1.00)
GFR calc Af Amer: 38 mL/min — ABNORMAL LOW (ref >60)
GFR calc non Af Amer: 33 mL/min — ABNORMAL LOW (ref >60)
Glucose, Bld: 122 mg/dL — ABNORMAL HIGH (ref 65–99)
Potassium: 3.5 mmol/L (ref 3.5–5.1)
SODIUM: 139 mmol/L (ref 135–145)
TOTAL PROTEIN: 8.4 g/dL — AB (ref 6.5–8.1)
Total Bilirubin: 0.8 mg/dL (ref 0.3–1.2)

## 2016-06-27 LAB — EXPECTORATED SPUTUM ASSESSMENT W GRAM STAIN, RFLX TO RESP C

## 2016-06-27 LAB — RESPIRATORY PANEL BY PCR
Adenovirus: NOT DETECTED
BORDETELLA PERTUSSIS-RVPCR: NOT DETECTED
CHLAMYDOPHILA PNEUMONIAE-RVPPCR: NOT DETECTED
Coronavirus 229E: NOT DETECTED
Coronavirus HKU1: NOT DETECTED
Coronavirus NL63: NOT DETECTED
Coronavirus OC43: NOT DETECTED
INFLUENZA A-RVPPCR: NOT DETECTED
INFLUENZA B-RVPPCR: NOT DETECTED
MYCOPLASMA PNEUMONIAE-RVPPCR: NOT DETECTED
Metapneumovirus: NOT DETECTED
PARAINFLUENZA VIRUS 4-RVPPCR: NOT DETECTED
Parainfluenza Virus 1: NOT DETECTED
Parainfluenza Virus 2: NOT DETECTED
Parainfluenza Virus 3: NOT DETECTED
RESPIRATORY SYNCYTIAL VIRUS-RVPPCR: NOT DETECTED
Rhinovirus / Enterovirus: NOT DETECTED

## 2016-06-27 LAB — CBC
HCT: 35.8 % — ABNORMAL LOW (ref 36.0–46.0)
HEMOGLOBIN: 11.9 g/dL — AB (ref 12.0–15.0)
MCH: 29.8 pg (ref 26.0–34.0)
MCHC: 33.2 g/dL (ref 30.0–36.0)
MCV: 89.7 fL (ref 78.0–100.0)
Platelets: 236 10*3/uL (ref 150–400)
RBC: 3.99 MIL/uL (ref 3.87–5.11)
RDW: 14.2 % (ref 11.5–15.5)
WBC: 13 10*3/uL — ABNORMAL HIGH (ref 4.0–10.5)

## 2016-06-27 LAB — MRSA PCR SCREENING: MRSA BY PCR: NEGATIVE

## 2016-06-27 LAB — PROCALCITONIN: Procalcitonin: 0.8 ng/mL

## 2016-06-27 LAB — ECHOCARDIOGRAM COMPLETE
Height: 70 in
Weight: 4800 oz

## 2016-06-27 LAB — TROPONIN I
Troponin I: 0.09 ng/mL (ref <0.03)
Troponin I: 0.08 ng/mL (ref <0.03)
Troponin I: 0.09 ng/mL (ref <0.03)

## 2016-06-27 LAB — HIV ANTIBODY (ROUTINE TESTING W REFLEX): HIV Screen 4th Generation wRfx: NONREACTIVE

## 2016-06-27 LAB — PHOSPHORUS: PHOSPHORUS: 2.2 mg/dL — AB (ref 2.5–4.6)

## 2016-06-27 LAB — EXPECTORATED SPUTUM ASSESSMENT W REFEX TO RESP CULTURE

## 2016-06-27 LAB — HEMOGLOBIN A1C
Hgb A1c MFr Bld: 5.7 % — ABNORMAL HIGH (ref 4.8–5.6)
Mean Plasma Glucose: 117 mg/dL

## 2016-06-27 LAB — STREP PNEUMONIAE URINARY ANTIGEN: Strep Pneumo Urinary Antigen: NEGATIVE

## 2016-06-27 MED ORDER — MORPHINE SULFATE (PF) 4 MG/ML IV SOLN
2.0000 mg | INTRAVENOUS | Status: DC | PRN
  Administered 2016-06-27 (×4): 4 mg via INTRAVENOUS
  Filled 2016-06-27 (×4): qty 1

## 2016-06-27 MED ORDER — METHYLPREDNISOLONE SODIUM SUCC 40 MG IJ SOLR
40.0000 mg | Freq: Two times a day (BID) | INTRAMUSCULAR | Status: DC
  Administered 2016-06-27 – 2016-06-28 (×2): 40 mg via INTRAVENOUS
  Filled 2016-06-27 (×2): qty 1

## 2016-06-27 MED ORDER — CARVEDILOL 12.5 MG PO TABS
12.5000 mg | ORAL_TABLET | Freq: Two times a day (BID) | ORAL | Status: DC
Start: 2016-06-27 — End: 2016-07-02
  Administered 2016-06-27 – 2016-07-02 (×10): 12.5 mg via ORAL
  Filled 2016-06-27 (×10): qty 1

## 2016-06-27 MED ORDER — CLONIDINE HCL 0.2 MG PO TABS
0.3000 mg | ORAL_TABLET | Freq: Three times a day (TID) | ORAL | Status: DC
  Administered 2016-06-27 (×2): 0.3 mg via ORAL
  Filled 2016-06-27 (×2): qty 1

## 2016-06-27 NOTE — Care Management Note (Signed)
Case Management Note Marvetta Gibbons RN, BSN Unit 2W-Case Manager-- [place] coverage [phone]  Patient Details  Name: EMARA LICHTER MRN: [id number] Date of Birth: October 15, 1970  Subjective/Objective:  Pt admitted with HTN urgency- hx of medical non-compliance                   Action/Plan: PTA pt lived at home- chart notes PCP- as [name]- pt listed as non-insured - CM to follow for d/c needs.   Expected Discharge Date:                  Expected Discharge Plan:  Home/Self Care  In-House Referral:     Discharge planning Services  CM Consult, Medication Assistance  Post Acute Care Choice:    Choice offered to:     DME Arranged:    DME Agency:     HH Arranged:    HH Agency:     Status of Service:  In process, will continue to follow  If discussed at Long Length of Stay Meetings, dates discussed:    Additional Comments:  Dawayne Patricia, RN 06/27/2016, 10:43 AM

## 2016-06-27 NOTE — Progress Notes (Signed)
eLink Physician-Brief Progress Note Patient Name: ANOKHI SHANNON DOB: 1971/02/12 MRN: [id number]   Date of Service  06/27/2016  HPI/Events of Note  Intermittent bursts of afib with RVR  eICU Interventions  Recommend primary team to assess meds or cardiology to assess     Intervention Category Evaluation Type: Other  Mahika Vanvoorhis 06/27/2016, 5:30 AM

## 2016-06-27 NOTE — Progress Notes (Signed)
Called E-link to report intermittent bursts of Afib RVR. Reported to Dr. Mortimer Fries

## 2016-06-27 NOTE — Progress Notes (Signed)
[facility] ADULT ICU REPLACEMENT PROTOCOL FOR AM LAB REPLACEMENT ONLY  The patient does not apply for the [facility] Adult ICU Electrolyte Replacment Protocol based on the criteria listed below:    Is GFR >/= 40 ml/min? No.  Patient's GFR today is 38    Abnormal electrolyte(s): K3.5  If a panic level lab has been reported, has the CCM MD in charge been notified? Yes.  .   Physician:  Corbin Ade, MD  Vear Clock 06/27/2016 6:20 AM

## 2016-06-27 NOTE — Progress Notes (Signed)
  Echocardiogram 2D Echocardiogram has been performed.  [name] M 06/27/2016, 7:54 AM

## 2016-06-27 NOTE — Consult Note (Signed)
PULMONARY / CRITICAL CARE MEDICINE   Name: [name] MRN: [id number] DOB: August 12, 1970    ADMISSION DATE:  06/26/2016 CONSULTATION DATE:  06/26/2016  REFERRING MD:  Dr. Shawn Route  CHIEF COMPLAINT:  HTN Urgency   HISTORY OF PRESENT ILLNESS:   46 year old female with PMH of anemia, gout, A.Flutter (on Eliquis), Diastolic HF, CKD Stage 3, HTN, Gout, Morbid Obesity, and Pulmonary HTN.  Managed for HTN crisis / emergency  SUBJECTIVE:  Remain son Cardene drip   VITAL SIGNS: BP (!) 215/99 (BP Location: Right Leg)   Pulse (!) 109   Temp 99.1 F (37.3 C) (Oral)   Resp (!) 29   Ht 5\' 10"  (1.778 m)   Wt 136.1 kg (300 lb)   SpO2 92%   BMI 43.05 kg/m   HEMODYNAMICS:    VENTILATOR SETTINGS:    INTAKE / OUTPUT: I/O last 3 completed shifts: In: -  Out: 2150 [Urine:2050; Emesis/NG output:100]  PHYSICAL EXAMINATION: General:awake, alert Neuro: nonfocal, alert, perrl HEENT:jvd tough to tell, obese  PULM: distant CV: s1 s2 RRT WU:JWJX, obese, nt, no r/g Extremities: edema  LABS:  BMET  Recent Labs Lab 06/26/16 1042 06/27/16 0221  NA 138 139  K 3.3* 3.5  CL 106 108  CO2 21* 24  BUN 28* 25*  CREATININE 1.85* 1.83*  GLUCOSE 151* 122*    Electrolytes  Recent Labs Lab 06/26/16 1042 06/27/16 0221  CALCIUM 9.0 8.8*  MG  --  1.8  PHOS  --  2.2*    CBC  Recent Labs Lab 06/26/16 1042 06/27/16 0221  WBC 19.3* 13.0*  HGB 12.3 11.9*  HCT 37.4 35.8*  PLT 250 236    Coag's No results for input(s): APTT, INR in the last 168 hours.  Sepsis Markers  Recent Labs Lab 06/26/16 1956 06/27/16 0221  LATICACIDVEN 0.9  --   PROCALCITON 0.66 0.80    ABG No results for input(s): PHART, PCO2ART, PO2ART in the last 168 hours.  Liver Enzymes  Recent Labs Lab 06/26/16 1042 06/27/16 0221  AST 16 15  ALT 9* 8*  ALKPHOS 101 87  BILITOT 1.2 0.8  ALBUMIN 3.4* 3.1*    Cardiac Enzymes  Recent Labs Lab 06/26/16 1956 06/27/16 0221 06/27/16 0740   TROPONINI 0.08* 0.09* 0.08*    Glucose  Recent Labs Lab 06/26/16 2114 06/26/16 2341 06/27/16 0351 06/27/16 0840 06/27/16 1230  GLUCAP 102* 106* 108* 125* 149*    Imaging No results found.   STUDIES:  CXR 5/1 > Cardiomegaly and pulmonary vascular congestion  ECHO 5/1 >> 55%, rv wnl  CULTURES: Blood 5/1 >> Sputum 5/1 >> Urine 5/1 >> Urinalysis 5/1 >> RVP 5/1 >> neg  ANTIBIOTICS: None.   SIGNIFICANT EVENTS: 5/1 > Presents to ED  5/2- remaining on cardene drip  LINES/TUBES: PIV   DISCUSSION: 46 year old female with uncontrolled HTN and Diastolic HF presents to ED with BP 239/131, non-responsive with clonidine, hydralazine, and metoprolol - requiring Nipride gtt.   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)  H/O A.Fib/A/Flutter (on Eliquis), HTN,   P:  Cardiac Monitoring  Maintain cardene to MAP goal  25% reduction =- MAP 110-120 Continue home Norvasc 10, increase Coreg, Clonidine ( re add as is rebounding from this ending), and Hydralazine ( may need reduce with tachy) Continue home aldactone   Wean Nipride and avoid with renal insuff and cyanide tox risk Continue home torsemide  Lasix 40 maintain as tolerated well Echo reviewed  RENAL A:   Hypokalemia  Chronic Renal Diease Stage 3  P:   bmet in am  Maintain diuresis kvo  GASTROINTESTINAL A:   GERD Nausea secondary to increased CNS pressure vs viral gastroenteritis  P:   PPI Heart Healthy diet  PRN Zofran   HEMATOLOGIC A:   Anemia of chronic diease  P:  Trend CBC Maintain Hbg >7  scd, add sub q hep  INFECTIOUS A:   Leukocytosis  No evidence  infection P:   Follow Culture Data  RVP neg  ENDOCRINE A:   Hyperglycemia < GOUT P:   Trend Glucose  SSI Add steroids  NEUROLOGIC A:   No headache P:   RASS goal: 0 Monitor    FAMILY  - Updates: no family at bedside , I updated pt  - Inter-disciplinary family meet or Palliative Care meeting due by:  07/03/2016   Ccm time 30 min  Lavon Paganini. Titus Mould, MD, [place] Pgr: [facility]

## 2016-06-28 ENCOUNTER — Inpatient Hospital Stay (HOSPITAL_COMMUNITY): Payer: Self-pay

## 2016-06-28 DIAGNOSIS — J810 Acute pulmonary edema: Secondary | ICD-10-CM

## 2016-06-28 LAB — HEMOGLOBIN A1C
HEMOGLOBIN A1C: 5.7 % — AB (ref 4.8–5.6)
MEAN PLASMA GLUCOSE: 117 mg/dL

## 2016-06-28 LAB — CULTURE, RESPIRATORY W GRAM STAIN

## 2016-06-28 LAB — BASIC METABOLIC PANEL
ANION GAP: 10 (ref 5–15)
ANION GAP: 8 (ref 5–15)
BUN: 25 mg/dL — ABNORMAL HIGH (ref 6–20)
BUN: 44 mg/dL — ABNORMAL HIGH (ref 6–20)
CALCIUM: 8.4 mg/dL — AB (ref 8.9–10.3)
CO2: 21 mmol/L — ABNORMAL LOW (ref 22–32)
CO2: 22 mmol/L (ref 22–32)
Calcium: 8.8 mg/dL — ABNORMAL LOW (ref 8.9–10.3)
Chloride: 105 mmol/L (ref 101–111)
Chloride: 105 mmol/L (ref 101–111)
Creatinine, Ser: 2.11 mg/dL — ABNORMAL HIGH (ref 0.44–1.00)
Creatinine, Ser: 2.99 mg/dL — ABNORMAL HIGH (ref 0.44–1.00)
GFR, EST AFRICAN AMERICAN: 32 mL/min — AB (ref >60)
GFR, EST AFRICAN AMERICAN: 21 mL/min — AB (ref >60)
GFR, EST NON AFRICAN AMERICAN: 27 mL/min — AB (ref >60)
GFR, EST NON AFRICAN AMERICAN: 18 mL/min — AB (ref >60)
Glucose, Bld: 151 mg/dL — ABNORMAL HIGH (ref 65–99)
Glucose, Bld: 161 mg/dL — ABNORMAL HIGH (ref 65–99)
POTASSIUM: 4.2 mmol/L (ref 3.5–5.1)
Potassium: 4.5 mmol/L (ref 3.5–5.1)
SODIUM: 136 mmol/L (ref 135–145)
SODIUM: 135 mmol/L (ref 135–145)

## 2016-06-28 LAB — LEGIONELLA PNEUMOPHILA SEROGP 1 UR AG: L. pneumophila Serogp 1 Ur Ag: NEGATIVE

## 2016-06-28 LAB — CULTURE, RESPIRATORY

## 2016-06-28 LAB — GLUCOSE, CAPILLARY
GLUCOSE-CAPILLARY: 155 mg/dL — AB (ref 65–99)
GLUCOSE-CAPILLARY: 158 mg/dL — AB (ref 65–99)
GLUCOSE-CAPILLARY: 119 mg/dL — AB (ref 65–99)
GLUCOSE-CAPILLARY: 132 mg/dL — AB (ref 65–99)
Glucose-Capillary: 157 mg/dL — ABNORMAL HIGH (ref 65–99)

## 2016-06-28 LAB — URINE CULTURE: CULTURE: NO GROWTH

## 2016-06-28 LAB — MAGNESIUM: Magnesium: 1.7 mg/dL (ref 1.7–2.4)

## 2016-06-28 LAB — PROCALCITONIN: PROCALCITONIN: 0.87 ng/mL

## 2016-06-28 MED ORDER — CLONIDINE HCL 0.2 MG PO TABS
0.4000 mg | ORAL_TABLET | Freq: Three times a day (TID) | ORAL | Status: DC
  Administered 2016-06-28 – 2016-06-29 (×6): 0.4 mg via ORAL
  Filled 2016-06-28 (×6): qty 2

## 2016-06-28 MED ORDER — METHYLPREDNISOLONE SODIUM SUCC 40 MG IJ SOLR
40.0000 mg | Freq: Every day | INTRAMUSCULAR | Status: DC
  Administered 2016-06-28 – 2016-06-29 (×2): 40 mg via INTRAVENOUS
  Filled 2016-06-28 (×2): qty 1

## 2016-06-28 MED ORDER — HYDRALAZINE HCL 50 MG PO TABS
100.0000 mg | ORAL_TABLET | Freq: Three times a day (TID) | ORAL | Status: DC
  Administered 2016-06-28 – 2016-07-02 (×13): 100 mg via ORAL
  Filled 2016-06-28 (×13): qty 2

## 2016-06-28 NOTE — Progress Notes (Addendum)
PULMONARY / CRITICAL CARE MEDICINE   Name: [name] MRN: [id number] DOB: [birth year]    ADMISSION DATE:  06/26/2016 CONSULTATION DATE:  06/26/2016  REFERRING MD:  Dr. Shawn Route  CHIEF COMPLAINT:  HTN Urgency   HISTORY OF PRESENT ILLNESS:   46 year old female with PMH of anemia, gout, A.Flutter (on Eliquis), Diastolic HF, CKD Stage 3, HTN, Gout, Morbid Obesity, and Pulmonary HTN.  Managed for HTN crisis / emergency  SUBJECTIVE:   Off cardene gtt at 0600 this AM.  SBP 159 now, MAP 103.  VITAL SIGNS: BP (!) 147/79   Pulse 72   Temp 98.3 F (36.8 C) (Oral)   Resp 14   Ht 5\' 10"  (1.778 m)   Wt 135.3 kg (298 lb 3.2 oz)   SpO2 91%   BMI 42.79 kg/m   HEMODYNAMICS:    VENTILATOR SETTINGS:    INTAKE / OUTPUT: I/O last 3 completed shifts: In: 706.4 [I.V.:706.4] Out: 4600 [Urine:4500; Emesis/NG output:100]  PHYSICAL EXAMINATION: General:awake, alert, sitting in chair Neuro: nonfocal HEENT:jvd tough to tell, obese  PULM: clear bilaterally CV: RRR GI: BS x 4, S/NT/ND Extremities: edema  LABS:  BMET  Recent Labs Lab 06/26/16 1042 06/27/16 0221 06/28/16 0203  NA 138 139 136  K 3.3* 3.5 4.2  CL 106 108 105  CO2 21* 24 21*  BUN 28* 25* 25*  CREATININE 1.85* 1.83* 2.11*  GLUCOSE 151* 122* 151*    Electrolytes  Recent Labs Lab 06/26/16 1042 06/27/16 0221 06/28/16 0203  CALCIUM 9.0 8.8* 8.8*  MG  --  1.8  --   PHOS  --  2.2*  --     CBC  Recent Labs Lab 06/26/16 1042 06/27/16 0221  WBC 19.3* 13.0*  HGB 12.3 11.9*  HCT 37.4 35.8*  PLT 250 236    Coag's No results for input(s): APTT, INR in the last 168 hours.  Sepsis Markers  Recent Labs Lab 06/26/16 1956 06/27/16 0221 06/28/16 0203  LATICACIDVEN 0.9  --   --   PROCALCITON 0.66 0.80 0.87    ABG No results for input(s): PHART, PCO2ART, PO2ART in the last 168 hours.  Liver Enzymes  Recent Labs Lab 06/26/16 1042 06/27/16 0221  AST 16 15  ALT 9* 8*  ALKPHOS 101 87  BILITOT  1.2 0.8  ALBUMIN 3.4* 3.1*    Cardiac Enzymes  Recent Labs Lab 06/27/16 0221 06/27/16 0740 06/27/16 1424  TROPONINI 0.09* 0.08* 0.09*    Glucose  Recent Labs Lab 06/27/16 0840 06/27/16 1230 06/27/16 1626 06/27/16 1913 06/27/16 2342 06/28/16 0352  GLUCAP 125* 149* 137* 210* 132* 155*    Imaging No results found.   STUDIES:  CXR 5/1 > Cardiomegaly and pulmonary vascular congestion  ECHO 5/1 >> 55%, rv wnl  CULTURES: Blood 5/1 >> Sputum 5/1 >> Urine 5/1 >> RVP 5/1 >> neg  ANTIBIOTICS: None.   SIGNIFICANT EVENTS: 5/1 > Presents to ED  5/2- remaining on cardene drip  LINES/TUBES: PIV   DISCUSSION: 46 year old female with uncontrolled HTN and Diastolic HF presents to ED with BP 239/131, non-responsive with clonidine, hydralazine, and metoprolol - requiring Nipride gtt.   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)  H/O A.Fib/A/Flutter (on Eliquis), HTN,   P:  Cardiac Monitoring  Maintain cardene to MAP goal 25% reduction =- MAP 110-120 Continue home Norvasc 10, increase Coreg, Clonidine (re add as is rebounding from this ending - increase from 0.3 to home dose of 0.4), aldactone, torsemide Add back home hydralazine DASH diet counseling  RENAL A:   Hypokalemia - resolved Chronic Renal Diease Stage 3  P:   Follow BMP Correct electrolytes as indicated  GASTROINTESTINAL A:   GERD Nausea secondary to increased CNS pressure vs viral gastroenteritis - resolved P:   PPI Heart Healthy diet  PRN Zofran   HEMATOLOGIC A:   Anemia of chronic diease  P:  Trend CBC Maintain Hbg >7   INFECTIOUS A:   Leukocytosis - No evidence infection, RVP neg. P:   Follow Culture Data   ENDOCRINE A:    Hyperglycemia Gout P:   Trend Glucose  SSI Drop steroids to daily dosing, wean to off in next 2 days  NEUROLOGIC A:   No headache P:   RASS goal: 0 Monitor    FAMILY  - Updates: no family at bedside , I updated pt  - Inter-disciplinary family meet or Palliative Care meeting due by:  07/03/2016   Transfer out of ICU and to tele.  Will ask TRH to assume care starting in AM 5/4.   Montey Hora, [facility] Pager: [phone]  or [phone] 06/28/2016, 9:26 AM  STAFF NOTE: I, Merrie Roof, MD FACP have personally reviewed patient's available data, including medical history, events of note, physical examination and test results as part of my evaluation. I have discussed with resident/NP and other care providers such as pharmacist, RN and RRT. In addition, I personally evaluated patient and elicited key findings of: awake, less crackels to clear anterior, obese, abdo soft, edema mmin nonpitting, pcxr with resolving edema but remains present, HTN is now off cardene, re increase clonidine to home 0.4, add back hydral as no longer brady, keep coreg at current dose and no ioncrease as just increased, follow HR response, was neg 1.7 liters crt rise, dc lasix and follow bmet again in am , remains with HTN crisis, ARF, now some concnern overdiuresis, to floor, keep tele, to triad, also with acvute gout, steroids, likely reduce in am    Lavon Paganini. Titus Mould, MD, [place] Pgr: [facility] 06/28/2016 11:19 AM

## 2016-06-28 NOTE — Progress Notes (Signed)
eLink Physician-Brief Progress Note Patient Name: [name] DOB: 06-25-1970 MRN: [id number]   Date of Service  06/28/2016  HPI/Events of Note  6 beat run of NSVT - Now in AFIB with controled ventricular rate = 68.  eICU Interventions  Will order: 1. BMP and Mg++ level STAT.     Intervention Category Major Interventions: Arrhythmia - evaluation and management  Trudie Cervantes Cornelia Copa 06/28/2016, 6:31 PM

## 2016-06-28 NOTE — Progress Notes (Signed)
Patient alarmed 6 beats v-tach.  Patient asymptomatic and sitting in chair.  Dr. Oletta Darter made aware, new orders received.  Will continue to monitor.

## 2016-06-29 ENCOUNTER — Inpatient Hospital Stay (HOSPITAL_COMMUNITY): Payer: Self-pay

## 2016-06-29 LAB — GLUCOSE, CAPILLARY
Glucose-Capillary: 104 mg/dL — ABNORMAL HIGH (ref 65–99)
Glucose-Capillary: 108 mg/dL — ABNORMAL HIGH (ref 65–99)
Glucose-Capillary: 114 mg/dL — ABNORMAL HIGH (ref 65–99)
Glucose-Capillary: 138 mg/dL — ABNORMAL HIGH (ref 65–99)
Glucose-Capillary: 153 mg/dL — ABNORMAL HIGH (ref 65–99)
Glucose-Capillary: 97 mg/dL (ref 65–99)

## 2016-06-29 MED ORDER — SODIUM CHLORIDE 0.9 % IV SOLN
250.0000 mL | INTRAVENOUS | Status: DC
  Administered 2016-06-29: 10:00:00 via INTRAVENOUS

## 2016-06-29 MED ORDER — SODIUM CHLORIDE 0.9 % IV SOLN: 250.0000 mL | INTRAVENOUS | Status: AC

## 2016-06-29 MED ORDER — MINOXIDIL 2.5 MG PO TABS
2.5000 mg | ORAL_TABLET | Freq: Every day | ORAL | Status: DC
  Administered 2016-06-29 – 2016-07-02 (×4): 2.5 mg via ORAL
  Filled 2016-06-29 (×4): qty 1

## 2016-06-29 NOTE — Progress Notes (Signed)
Patient alarmed 8 beats vtach.  Patient asymptomatic and sitting in chair.  Will continue to monitor.

## 2016-06-29 NOTE — Discharge Instructions (Addendum)
Follow with Primary MD [name], MD in 2-3 days   Get CBC, CMP, 2 view Chest X ray checked  by Primary MD or SNF MD in 2-3 days ( we routinely change or add medications that can affect your baseline labs and fluid status, therefore we recommend that you get the mentioned basic workup next visit with your PCP, your PCP may decide not to get them or add new tests based on their clinical decision)  Activity: As tolerated with Full fall precautions use walker/cane & assistance as needed  Disposition Home    Diet:   Heart Healthy - Check your Weight same time everyday, if you gain over 2 pounds, or you develop in leg swelling, experience more shortness of breath or chest pain, call your Primary MD immediately. Follow Cardiac Low Salt Diet and 1.5 lit/day fluid restriction.  On your next visit with your primary care physician please Get Medicines reviewed and adjusted.  Please request your Prim.MD to go over all Hospital Tests and Procedure/Radiological results at the follow up, please get all Hospital records sent to your Prim MD by signing hospital release before you go home.  If you experience worsening of your admission symptoms, develop shortness of breath, life threatening emergency, suicidal or homicidal thoughts you must seek medical attention immediately by calling 911 or calling your MD immediately  if symptoms less severe.  You Must read complete instructions/literature along with all the possible adverse reactions/side effects for all the Medicines you take and that have been prescribed to you. Take any new Medicines after you have completely understood and accpet all the possible adverse reactions/side effects.   Do not drive, operate heavy machinery, perform activities at heights, swimming or participation in water activities or provide baby sitting services if your were admitted for syncope or siezures until you have seen by Primary MD or a Neurologist and advised to do so again.  Do  not drive when taking Pain medications.    Do not take more than prescribed Pain, Sleep and Anxiety Medications  Special Instructions: If you have smoked or chewed Tobacco  in the last 2 yrs please stop smoking, stop any regular Alcohol  and or any Recreational drug use.  Wear Seat belts while driving.   Please note  You were cared for by a hospitalist during your hospital stay. If you have any questions about your discharge medications or the care you received while you were in the hospital after you are discharged, you can call the unit and asked to speak with the hospitalist on call if the hospitalist that took care of you is not available. Once you are discharged, your primary care physician will handle any further medical issues. Please note that NO REFILLS for any discharge medications will be authorized once you are discharged, as it is imperative that you return to your primary care physician (or establish a relationship with a primary care physician if you do not have one) for your aftercare needs so that they can reassess your need for medications and monitor your lab values.    Information on my medicine - ELIQUIS (apixaban)  This medication education was reviewed with me or my healthcare representative as part of my discharge preparation.  Why was Eliquis prescribed for you? Eliquis was prescribed for you to reduce the risk of a blood clot forming that can cause a stroke if you have a medical condition called atrial fibrillation (a type of irregular heartbeat).  What do You need  to know about Eliquis ? Take your Eliquis TWICE DAILY - one tablet in the morning and one tablet in the evening with or without food. If you have difficulty swallowing the tablet whole please discuss with your pharmacist how to take the medication safely.  Take Eliquis exactly as prescribed by your doctor and DO NOT stop taking Eliquis without talking to the doctor who prescribed the medication.   Stopping may increase your risk of developing a stroke.  Refill your prescription before you run out.  After discharge, you should have regular check-up appointments with your healthcare provider that is prescribing your Eliquis.  In the future your dose may need to be changed if your kidney function or weight changes by a significant amount or as you get older.  What do you do if you miss a dose? If you miss a dose, take it as soon as you remember on the same day and resume taking twice daily.  Do not take more than one dose of ELIQUIS at the same time to make up a missed dose.  Important Safety Information A possible side effect of Eliquis is bleeding. You should call your healthcare provider right away if you experience any of the following: ? Bleeding from an injury or your nose that does not stop. ? Unusual colored urine (red or dark [place]) or unusual colored stools (red or black). ? Unusual bruising for unknown reasons. ? A serious fall or if you hit your head (even if there is no bleeding).  Some medicines may interact with Eliquis and might increase your risk of bleeding or clotting while on Eliquis. To help avoid this, consult your healthcare provider or pharmacist prior to using any new prescription or non-prescription medications, including herbals, vitamins, non-steroidal anti-inflammatory drugs (NSAIDs) and supplements.  This website has more information on Eliquis (apixaban): http://www.eliquis.com/eliquis/home

## 2016-06-29 NOTE — Progress Notes (Signed)
PROGRESS NOTE                                                                                                                                                                                                             Patient Demographics:    [name], is a 46 y.o. female, DOB - [birth year], CNO:[id number]  Admit date - 06/26/2016   Admitting Physician [name], MD  Outpatient Primary MD for the patient is Maggie Font, MD  LOS - 3  Chief Complaint  Patient presents with  . Hypertension  . Nausea       Brief Narrative   46 year old female with PMH of anemia, gout, A.Flutter (on Eliquis), Diastolic HF, CKD Stage 3, HTN, Gout, Morbid Obesity, and Pulmonary HTN.  Managed for HTN crisis / emergency, Was admitted by ICU team and required my primary drip.   Subjective:    Joesphine Bare today has, No headache, No chest pain, No abdominal pain - No Nausea, No new weakness tingling or numbness, No Cough - SOB.     Assessment  & Plan :     1.Hypertensive crisis. Causing acute on chronic diastolic CHF EF 29% also has underlying pulmonary hypertension. Initially was in ICU and on my prior drip, blood pressure somewhat better currently on high doses of Norvasc, Coreg, clonidine and hydralazine. Blood pressure still high. We will add minoxidil with caution and monitor. Placed on salt and fluid restriction.  2. Acute to chronic diastolic CHF EF 47% due to #1 above. Has been adequately diuresed, no signs of fluid overload. Due to ARF hold further diuretics.  3. ARF once daily stage IV. Baseline creatinine close to 2, hold diuretics and nephrotoxins and monitor.  4. Anemia of chronic disease. No acute issues continue to monitor.  5. Pulmonary hypertension. Outpatient pulmonary follow-up.  6. Chronic atrial fibrillation [place] vasc 2 score of at least 3. Continue Coreg along with Eliquis.  7. Morbid obesity. Follow  with PCP for weight loss.  8. GERD. On PPI continue.    Diet : Diet Heart Room service appropriate? Yes; Fluid consistency: Thin    Family Communication  :  None  Code Status :  Full  Disposition Plan  :  Home 1-2 days  Consults  :  PCCM  Procedures  :  TTE - Left ventricle: The cavity size was normal. Wall thickness was increased in a pattern of severe LVH. Systolic function was normal. The estimated ejection fraction was in the range of 55%  to 60%. The study is not technically sufficient to allow evaluation of LV diastolic function. - Left atrium: The atrium was severely dilated. - Atrial septum: No defect or patent foramen ovale was identified. - Pericardium, extracardiac: A trivial pericardial effusion was identified.   DVT Prophylaxis  :  Eliquis  Lab Results  Component Value Date   PLT 236 06/27/2016    Inpatient Medications  Scheduled Meds: . allopurinol  100 mg Oral Daily  . amLODipine  10 mg Oral Daily  . apixaban  5 mg Oral BID  . aspirin EC  81 mg Oral Daily  . carvedilol  12.5 mg Oral BID WC  . cloNIDine  0.4 mg Oral TID  . colchicine  0.6 mg Oral Daily  . folic acid  1 mg Oral Daily  . hydrALAZINE  100 mg Oral TID  . insulin aspart  2-6 Units Subcutaneous Q4H  . minoxidil  2.5 mg Oral Daily  . pantoprazole  40 mg Oral Daily   Continuous Infusions: . sodium chloride 75 mL/hr at 06/29/16 1015   PRN Meds:.acetaminophen **OR** [DISCONTINUED] acetaminophen, bisacodyl, HYDROcodone-acetaminophen, [DISCONTINUED] ondansetron **OR** ondansetron (ZOFRAN) IV, senna-docusate  Antibiotics  :    Anti-infectives    None         Objective:   Vitals:   06/29/16 0015 06/29/16 0446 06/29/16 0548 06/29/16 0549  BP: 140/89  (!) 161/92 (!) 158/103  Pulse: 70  (!) 59   Resp: 18  18   Temp: 98 F (36.7 C)  98 F (36.7 C)   TempSrc: Oral  Oral   SpO2: 95%  98%   Weight:  135.9 kg (299 lb 8 oz)    Height:        Wt Readings from Last 3 Encounters:    06/29/16 135.9 kg (299 lb 8 oz)  05/17/16 (!) 138.2 kg (304 lb 9.6 oz)  01/12/16 136.1 kg (300 lb)     Intake/Output Summary (Last 24 hours) at 06/29/16 1107 Last data filed at 06/29/16 1029  Gross per 24 hour  Intake              460 ml  Output             1500 ml  Net            -1040 ml     Physical Exam  Awake Alert, Oriented X 3, No new F.N deficits, Normal affect [place].AT,PERRAL Supple Neck,No JVD, No cervical lymphadenopathy appriciated.  Symmetrical Chest wall movement, Good air movement bilaterally, CTAB RRR,No Gallops,Rubs or new Murmurs, No Parasternal Heave +ve B.Sounds, Abd Soft, No tenderness, No organomegaly appriciated, No rebound - guarding or rigidity. No Cyanosis, Clubbing or edema, No new Rash or bruise       Data Review:    CBC  Recent Labs Lab 06/26/16 1042 06/27/16 0221  WBC 19.3* 13.0*  HGB 12.3 11.9*  HCT 37.4 35.8*  PLT 250 236  MCV 90.8 89.7  MCH 29.9 29.8  MCHC 32.9 33.2  RDW 13.8 14.2    Chemistries   Recent Labs Lab 06/26/16 1042 06/27/16 0221 06/28/16 0203 06/28/16 1848  NA 138 139 136 135  K 3.3* 3.5 4.2 4.5  CL 106 108 105 105  CO2 21* 24 21* 22  GLUCOSE 151* 122* 151*  161*  BUN 28* 25* 25* 44*  CREATININE 1.85* 1.83* 2.11* 2.99*  CALCIUM 9.0 8.8* 8.8* 8.4*  MG  --  1.8  --  1.7  AST 16 15  --   --   ALT 9* 8*  --   --   ALKPHOS 101 87  --   --   BILITOT 1.2 0.8  --   --    ------------------------------------------------------------------------------------------------------------------ No results for input(s): CHOL, HDL, LDLCALC, TRIG, CHOLHDL, LDLDIRECT in the last 72 hours.  Lab Results  Component Value Date   HGBA1C 5.7 (H) 06/27/2016   ------------------------------------------------------------------------------------------------------------------  Recent Labs  06/26/16 1654  TSH 1.802    ------------------------------------------------------------------------------------------------------------------ No results for input(s): VITAMINB12, FOLATE, FERRITIN, TIBC, IRON, RETICCTPCT in the last 72 hours.  Coagulation profile No results for input(s): INR, PROTIME in the last 168 hours.  No results for input(s): DDIMER in the last 72 hours.  Cardiac Enzymes  Recent Labs Lab 06/27/16 0221 06/27/16 0740 06/27/16 1424  TROPONINI 0.09* 0.08* 0.09*   ------------------------------------------------------------------------------------------------------------------    Component Value Date/Time   BNP 607.5 (H) 06/26/2016 1042    Micro Results Recent Results (from the past 240 hour(s))  Respiratory Panel by PCR     Status: None   Collection Time: 06/26/16  1:00 AM  Result Value Ref Range Status   Adenovirus NOT DETECTED NOT DETECTED Final   Coronavirus 229E NOT DETECTED NOT DETECTED Final   Coronavirus HKU1 NOT DETECTED NOT DETECTED Final   Coronavirus NL63 NOT DETECTED NOT DETECTED Final   Coronavirus OC43 NOT DETECTED NOT DETECTED Final   Metapneumovirus NOT DETECTED NOT DETECTED Final   Rhinovirus / Enterovirus NOT DETECTED NOT DETECTED Final   Influenza A NOT DETECTED NOT DETECTED Final   Influenza B NOT DETECTED NOT DETECTED Final   Parainfluenza Virus 1 NOT DETECTED NOT DETECTED Final   Parainfluenza Virus 2 NOT DETECTED NOT DETECTED Final   Parainfluenza Virus 3 NOT DETECTED NOT DETECTED Final   Parainfluenza Virus 4 NOT DETECTED NOT DETECTED Final   Respiratory Syncytial Virus NOT DETECTED NOT DETECTED Final   Bordetella pertussis NOT DETECTED NOT DETECTED Final   Chlamydophila pneumoniae NOT DETECTED NOT DETECTED Final   Mycoplasma pneumoniae NOT DETECTED NOT DETECTED Final  Culture, blood (Routine X 2) w Reflex to ID Panel     Status: None (Preliminary result)   Collection Time: 06/26/16  5:07 PM  Result Value Ref Range Status   Specimen Description BLOOD  LEFT FOREARM  Final   Special Requests IN PEDIATRIC BOTTLE Blood Culture adequate volume  Final   Culture NO GROWTH 2 DAYS  Final   Report Status PENDING  Incomplete  Culture, blood (Routine X 2) w Reflex to ID Panel     Status: None (Preliminary result)   Collection Time: 06/26/16  5:22 PM  Result Value Ref Range Status   Specimen Description BLOOD RIGHT ARM  Final   Special Requests   Final    BOTTLES DRAWN AEROBIC ONLY Blood Culture adequate volume   Culture NO GROWTH 2 DAYS  Final   Report Status PENDING  Incomplete  Culture, Urine     Status: None   Collection Time: 06/26/16 10:30 PM  Result Value Ref Range Status   Specimen Description URINE, CATHETERIZED  Final   Special Requests NONE  Final   Culture NO GROWTH  Final   Report Status 06/28/2016 FINAL  Final  Culture, expectorated sputum-assessment     Status: None   Collection Time: 06/27/16  1:55 AM  Result Value Ref Range Status   Specimen Description SPUTUM  Final   Special Requests NONE  Final   Sputum evaluation THIS SPECIMEN IS ACCEPTABLE FOR SPUTUM CULTURE  Final   Report Status 06/27/2016 FINAL  Final  Culture, respiratory (NON-Expectorated)     Status: None   Collection Time: 06/27/16  1:55 AM  Result Value Ref Range Status   Specimen Description SPUTUM  Final   Special Requests NONE Reflexed from [id number]  Final   Gram Stain   Final    FEW WBC PRESENT,BOTH PMN AND MONONUCLEAR FEW GRAM POSITIVE COCCI RARE GRAM NEGATIVE RODS    Culture   Final    MODERATE HAEMOPHILUS INFLUENZAE BETA LACTAMASE NEGATIVE    Report Status 06/28/2016 FINAL  Final  MRSA PCR Screening     Status: None   Collection Time: 06/27/16  9:01 AM  Result Value Ref Range Status   MRSA by PCR NEGATIVE NEGATIVE Final    Comment:        The GeneXpert MRSA Assay (FDA approved for NASAL specimens only), is one component of a comprehensive MRSA colonization surveillance program. It is not intended to diagnose MRSA infection nor to guide  or monitor treatment for MRSA infections.     Radiology Reports Dg Chest 2 View  Result Date: 06/26/2016 CLINICAL DATA:  Nausea and vomiting this morning. EXAM: CHEST  2 VIEW COMPARISON:  PA and lateral chest 12/17/2014. FINDINGS: There is cardiomegaly and pulmonary vascular congestion. No consolidative process, pneumothorax or effusion. No focal bony abnormality. IMPRESSION: Cardiomegaly and pulmonary vascular congestion. Electronically Signed   By: Inge Rise M.D.   On: 06/26/2016 11:09   Dg Chest Port 1 View  Result Date: 06/29/2016 CLINICAL DATA:  Shortness of Breath EXAM: PORTABLE CHEST 1 VIEW COMPARISON:  Jun 28, 2016 FINDINGS: There is no edema or consolidation. Heart is mildly enlarged with pulmonary vascularity within normal limits. No adenopathy. No bone lesions. IMPRESSION: Mild cardiomegaly.  No edema or consolidation. Electronically Signed   By: Lowella Grip III M.D.   On: 06/29/2016 07:10   Dg Chest Port 1 View  Result Date: 06/28/2016 CLINICAL DATA:  Cough and leg swelling EXAM: PORTABLE CHEST 1 VIEW COMPARISON:  06/26/2016 FINDINGS: Cardiac shadow is mildly enlarged but stable. The lungs are well aerated bilaterally. Mild vascular congestion remains. No focal infiltrate or sizable effusion is noted. No bony abnormality is seen. IMPRESSION: Stable mild vascular congestion. Electronically Signed   By: Inez Catalina M.D.   On: 06/28/2016 09:59    Time Spent in minutes  30   Lala Lund M.D on 06/29/2016 at 11:07 AM  Between 7am to 7pm - Pager - [phone] ( page via [email or web address], text pages only, please mention full 10 digit call back number). After 7pm go to [email or web address] - password [email or web address]

## 2016-06-30 ENCOUNTER — Inpatient Hospital Stay (HOSPITAL_COMMUNITY): Payer: Self-pay

## 2016-06-30 LAB — GLUCOSE, CAPILLARY
GLUCOSE-CAPILLARY: 103 mg/dL — AB (ref 65–99)
GLUCOSE-CAPILLARY: 104 mg/dL — AB (ref 65–99)
GLUCOSE-CAPILLARY: 104 mg/dL — AB (ref 65–99)
GLUCOSE-CAPILLARY: 113 mg/dL — AB (ref 65–99)
GLUCOSE-CAPILLARY: 153 mg/dL — AB (ref 65–99)
Glucose-Capillary: 119 mg/dL — ABNORMAL HIGH (ref 65–99)

## 2016-06-30 LAB — CREATININE, URINE, RANDOM: CREATININE, URINE: 106.92 mg/dL

## 2016-06-30 LAB — BASIC METABOLIC PANEL
ANION GAP: 11 (ref 5–15)
BUN: 71 mg/dL — ABNORMAL HIGH (ref 6–20)
CO2: 21 mmol/L — ABNORMAL LOW (ref 22–32)
Calcium: 7.9 mg/dL — ABNORMAL LOW (ref 8.9–10.3)
Chloride: 104 mmol/L (ref 101–111)
Creatinine, Ser: 3.23 mg/dL — ABNORMAL HIGH (ref 0.44–1.00)
GFR calc Af Amer: 19 mL/min — ABNORMAL LOW (ref >60)
GFR, EST NON AFRICAN AMERICAN: 16 mL/min — AB (ref >60)
Glucose, Bld: 111 mg/dL — ABNORMAL HIGH (ref 65–99)
POTASSIUM: 4.4 mmol/L (ref 3.5–5.1)
SODIUM: 136 mmol/L (ref 135–145)

## 2016-06-30 LAB — SODIUM, URINE, RANDOM: SODIUM UR: 23 mmol/L

## 2016-06-30 MED ORDER — MAGNESIUM SULFATE IN D5W 1-5 GM/100ML-% IV SOLN
1.0000 g | Freq: Once | INTRAVENOUS | Status: AC
  Administered 2016-06-30: 1 g via INTRAVENOUS
  Filled 2016-06-30: qty 100

## 2016-06-30 MED ORDER — CLONIDINE HCL 0.3 MG PO TABS
0.3000 mg | ORAL_TABLET | Freq: Three times a day (TID) | ORAL | Status: DC
  Administered 2016-06-30 – 2016-07-02 (×7): 0.3 mg via ORAL
  Filled 2016-06-30 (×7): qty 1

## 2016-06-30 MED ORDER — SODIUM CHLORIDE 0.9 % IV SOLN
INTRAVENOUS | Status: DC
  Administered 2016-06-30: 18:00:00 via INTRAVENOUS
  Administered 2016-07-02: 75 mL/h via INTRAVENOUS

## 2016-06-30 MED ORDER — SODIUM CHLORIDE 0.9 % IV BOLUS (SEPSIS)
500.0000 mL | Freq: Once | INTRAVENOUS | Status: AC
  Administered 2016-06-30: 500 mL via INTRAVENOUS

## 2016-06-30 NOTE — Progress Notes (Signed)
Spoke with Pharmacy regarding Allopurinol with increasing Creat ok to give. Cont to monitor Creat.

## 2016-06-30 NOTE — Consult Note (Addendum)
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 year-old with long hx of significant HTN, also CKD, diastolic CHF w R HF and pulm HTN. Baseline creat is 1.5- 2.5 dating back to 2013.  Last creat's here in Aug 2017 were 1.9 and in Mar 2018 was 1.76.  Pt admitted 5 days ago with N/V for 3 days, dizziness and HTN'sive crisis.  She was admitted and treated with IV and oral medication including Nipride.  She rec'd IV lasix one dose then daily torsemide po for another 2 days and had a significant diuresis. Initial CXR showed "vasc congestion".  Down 3kg from admission, but creat is rising up from 1.83 on admission to 3.23 today.   BP's have improved significantly from 210/123 on admission to 145/ 90 now.    Pt feels a lot better. Does use nsaids at home sometime for 'gout".  She knows about her CKD, does not have kidney doctor.    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 Worked in [employer] in [place] and [place].  Single, no children, lives w/ a nephew and a sister in [place].  No tob/ etoh. Went onto disability in 2006 due to severe HTN difficult to control.    No sig PSH.  No hx MI or CVA.    ROS  denies CP  no joint pain   no HA  no blurry vision  no rash  no diarrhea  no nausea/ vomiting  no dysuria  no difficulty voiding  no change in urine color    Past Medical History  Past Medical History:  Diagnosis Date  . Anemia   . Atrial flutter ([place])   . Chronic diastolic heart failure ([place])    a. Amyloidosis work-up negative 2013  b. 12/07/2014 ECHO EF 55-60%. LA severely dilated. RV normal RA moderately dilated, Severe LVH.IVC dilated.   . CKD (chronic kidney disease) stage 3, GFR 30-59 ml/min   . Essential hypertension, benign   . Gout   . History of cardiac  catheterization    a. ectatic coronaries without obstruction 2006 - [place]  . Hypertensive heart disease   . Morbid obesity ([place])   . Persistent atrial fibrillation ([place])   . Pulmonary hypertension ([place])    Past Surgical History  Past Surgical History:  Procedure Laterality Date  . CARDIOVERSION  01/15/2012   Procedure: CARDIOVERSION;  Surgeon: Jolaine Artist, MD;  Location: [facility] ENDOSCOPY;  Service: Cardiovascular;  Laterality: N/A;  . RIGHT HEART CATHETERIZATION N/A 12/26/2011   Procedure: RIGHT HEART CATH;  Surgeon: Jolaine Artist, MD;  Location: [facility] CATH LAB;  Service: Cardiovascular;  Laterality: N/A;  . SKIN BIOPSY  12/18/2011   Procedure: BIOPSY SKIN;  Surgeon: Donato Heinz, MD;  Location: [facility] ORS;  Service: General;  Laterality: N/A;  in the minor room.  . TEE WITHOUT CARDIOVERSION  01/15/2012   Procedure: TRANSESOPHAGEAL ECHOCARDIOGRAM (TEE);  Surgeon: Jolaine Artist, MD;  Location: [facility] ENDOSCOPY;  Service: Cardiovascular;  Laterality: N/A;  cardioversion/on xarelto   Family History  Family History  Problem Relation Age of Onset  . Hypertension Mother   . Diabetes Mother    Social History  reports that she has never smoked. She has never used smokeless tobacco. She reports that she does not drink alcohol or use drugs. Allergies  Allergies  Allergen Reactions  . Penicillins Hives    Childhood allergy. Has patient had a PCN reaction causing immediate rash, facial/tongue/throat swelling, SOB or lightheadedness with hypotension: NO Has patient had a PCN reaction causing severe rash involving mucus membranes or skin necrosis: No NO Has patient had a PCN reaction that required hospitalization No NO Has patient had a PCN reaction occurring within the last 10 years: NO If all of the above answers are "NO", then may pro   Home medications Prior to Admission medications   Medication Sig Start Date End Date Taking? Authorizing Provider  acetaminophen (TYLENOL) 325 MG  tablet Take 650 mg by mouth every 6 (six) hours as needed for mild pain or headache.   Yes [provider]  allopurinol (ZYLOPRIM) 100 MG tablet Take 100 mg by mouth daily.  01/04/12  Yes Theodis Blaze, MD  amLODipine (NORVASC) 10 MG tablet Take 1 tablet (10 mg total) by mouth daily. 12/20/11  Yes Gosrani, Nimish C, MD  apixaban (ELIQUIS) 5 MG TABS tablet Take 5 mg by mouth 2 (two) times daily.   Yes [provider]  carvedilol (COREG) 6.25 MG tablet Take 1 tablet (6.25 mg total) by mouth 2 (two) times daily with a meal. 09/27/15  Yes Clegg, Amy D, NP  cloNIDine (CATAPRES) 0.2 MG tablet Take 2 tablets (0.4 mg total) by mouth 3 (three) times daily. May take an additional tab prn for SBP>180 11/01/15  Yes Tillery, Satira Mccallum, PA-C  colchicine 0.6 MG tablet Take 0.6 mg by mouth daily as needed (gouty flare).   Yes [provider]  folic acid (FOLVITE) 1 MG tablet Take 1 tablet (1 mg total) by mouth daily. 12/20/11  Yes Gosrani, Nimish C, MD  hydrALAZINE (APRESOLINE) 100 MG tablet Take 1 tablet (100 mg total) by mouth 3 (three) times daily. 12/09/15  Yes Bensimhon, Shaune Pascal, MD  loratadine (CLARITIN) 10 MG tablet Take 10 mg by mouth daily.   Yes [provider]  oxyCODONE-acetaminophen (PERCOCET/ROXICET) 5-325 MG tablet Take 1 tablet by mouth every 6 (six) hours as needed for severe pain. 01/12/16  Yes Shary Decamp, PA-C  pantoprazole (PROTONIX) 40 MG tablet Take 1 tablet (40 mg total) by mouth daily. 12/20/11  Yes Gosrani, Nimish C, MD  potassium chloride SA (K-DUR,KLOR-CON) 20 MEQ tablet Take 1 tablet (20 mEq total) by mouth daily. 12/12/14  Yes Eileen Stanford, PA-C  spironolactone (ALDACTONE) 25 MG tablet Take 1 tablet (25 mg total) by mouth at bedtime. 05/17/16 08/15/16 Yes TillerySatira Mccallum, PA-C  torsemide (DEMADEX) 20 MG tablet Take 40 mg by mouth daily. Take an additional 20 mg (1 tablet) in the afternoon for swelling in the legs or shortness of breath    Yes [provider]   Liver Function Tests  Recent Labs Lab 06/26/16 1042 06/27/16 0221  AST 16 15  ALT 9* 8*  ALKPHOS 101 87  BILITOT 1.2 0.8  PROT 9.7* 8.4*  ALBUMIN 3.4* 3.1*    Recent Labs Lab 06/26/16 1042 06/26/16 1956  LIPASE 21  --   AMYLASE  --  87   CBC  Recent Labs Lab 06/26/16 1042 06/27/16 0221  WBC 19.3* 13.0*  HGB 12.3 11.9*  HCT 37.4 35.8*  MCV 90.8 89.7  PLT 250 400   Basic Metabolic Panel  Recent Labs Lab 06/26/16 1042 06/27/16 0221 06/28/16 0203 06/28/16 1848 06/30/16 0152  NA 138 139 136 135 136  K 3.3* 3.5 4.2 4.5 4.4  CL  106 108 105 105 104  CO2 21* 24 21* 22 21*  GLUCOSE 151* 122* 151* 161* 111*  BUN 28* 25* 25* 44* 71*  CREATININE 1.85* 1.83* 2.11* 2.99* 3.23*  CALCIUM 9.0 8.8* 8.8* 8.4* 7.9*  PHOS  --  2.2*  --   --   --    Iron/TIBC/Ferritin/ %Sat    Component Value Date/Time   IRON 49 12/11/2011 0152   TIBC 353 12/11/2011 0152   FERRITIN 76 12/11/2011 0152   IRONPCTSAT 14 (L) 12/11/2011 0152    Vitals:   06/29/16 1143 06/29/16 2006 06/30/16 0419 06/30/16 1207  BP: 129/69 (!) 111/55 (!) 150/91 (!) 169/95  Pulse: 70 69 77 62  Resp: 16 18 18 20   Temp: 98.6 F (37 C) 98.2 F (36.8 C) 97.7 F (36.5 C) 97.4 F (36.3 C)  TempSrc: Oral Oral Oral Oral  SpO2: 97% 97% 98% 99%  Weight:   135 kg (297 lb 11.2 oz)   Height:       Exam Gen obese AAF not in distress No rash, cyanosis or gangrene Sclera anicteric, throat clear  No jvd or bruits Chest clear bilat to bases, no rales or wheezing RRR no MRG Abd soft ntnd no mass or ascites +bs markedly obese, no abd wall edema GU defer MS no joint effusions or deformity Ext 1+pitting LLE edema , minimal RLE edema Chronic skin changes LLE / no wounds or ulcers Neuro is alert, Ox 3 , nf, pleasant  UA > negative Renal US > ^'d echo bilat, some cortical thinning, no hydro. 10.4/ 11.6 cm.    Assessment: 1  Acute renal insufficiency - in pt with CKD 3/4 admitted  for HTN'sive crisis w N/V and diuresed x 3 days here.  Suspect she is vol depleted.  Will start IVF's. Diuretics have been stopped.   Has mostly R HF history and is below her usual wt of 305-310 (297 lbs today).  2  CKD stage 3/4 - baseline creat 1.5- 2.0.  Prob due to HTN +/- cardiorenal 3  Diast CHF/ sever pulm HTN 4  Morbid obesity 5  HTN - severe, long-standing. Controlled here on 4 home meds (clon/ hydral/ coreg/ norvasc) and minoxidil low dose just added 2 days ago here. Make sure BP not overcontrolled (keep SBP over 120 or so).    Plan - as above   Kelly Splinter MD [employer] pager [phone]   06/30/2016, 3:59 PM

## 2016-06-30 NOTE — Progress Notes (Signed)
PROGRESS NOTE                                                                                                                                                                                                             Patient Demographics:    [name], is a 46 y.o. female, DOB - Mar 30, 1970, ZOX:[id number]  Admit date - 06/26/2016   Admitting Physician [name], MD  Outpatient Primary MD for the patient is [name], MD  LOS - 4  Chief Complaint  Patient presents with  . Hypertension  . Nausea       Brief Narrative   46 year old female with PMH of anemia, gout, A.Flutter (on Eliquis), Diastolic HF, CKD Stage 3, HTN, Gout, Morbid Obesity, and Pulmonary HTN.  Managed for HTN crisis / emergency, Was admitted by ICU team and required my primary drip.   Subjective:   Patient in bed, appears comfortable, denies any headache, no fever, no chest pain or pressure, no shortness of breath , no abdominal pain. No focal weakness.   Assessment  & Plan :     1.Hypertensive crisis. Causing acute on chronic diastolic CHF EF 81% also has underlying pulmonary hypertension. Initially was in ICU and on my prior drip - Blood pressure now better uncommon addition of Norvasc, Coreg, clonidine, hydralazine and recently added minoxidil. Continue to monitor.   2. Acute to chronic diastolic CHF EF 19% due to #1 above. Has been adequately diuresed, no signs of fluid overload Lasix held due to ARF.  3. ARF once daily stage IV. Baseline creatinine close to 2, diuretics held, renal function continues to decline, renal ultrasound stable, does have proteinuria on UA, will request nephrology to evaluate.  4. Anemia of chronic disease. No acute issues continue to monitor.  5. Pulmonary hypertension. Outpatient pulmonary follow-up.  6. Chronic atrial fibrillation [place] vasc 2 score of at least 3. Continue Coreg along with  Eliquis.  7. Morbid obesity. Follow with PCP for weight loss.  8. GERD. On PPI continue.    Diet : Diet Heart Room service appropriate? Yes; Fluid consistency: Thin; Fluid restriction: 1500 mL Fluid    Family Communication  :  None  Code Status :  Full  Disposition Plan  :  Home 1-2 days  Consults  :  PCCM, nephrology  Procedures  :  TTE - Left ventricle: The cavity size was normal. Wall thickness was increased in a pattern of severe LVH. Systolic function was normal. The estimated ejection fraction was in the range of 55%  to 60%. The study is not technically sufficient to allow evaluation of LV diastolic function. - Left atrium: The atrium was severely dilated. - Atrial septum: No defect or patent foramen ovale was identified. - Pericardium, extracardiac: A trivial pericardial effusion was identified.  Renal ultrasound. Non acute    DVT Prophylaxis  :  Eliquis  Lab Results  Component Value Date   PLT 236 06/27/2016    Inpatient Medications  Scheduled Meds: . allopurinol  100 mg Oral Daily  . amLODipine  10 mg Oral Daily  . apixaban  5 mg Oral BID  . aspirin EC  81 mg Oral Daily  . carvedilol  12.5 mg Oral BID WC  . cloNIDine  0.3 mg Oral TID  . colchicine  0.6 mg Oral Daily  . folic acid  1 mg Oral Daily  . hydrALAZINE  100 mg Oral TID  . insulin aspart  2-6 Units Subcutaneous Q4H  . minoxidil  2.5 mg Oral Daily  . pantoprazole  40 mg Oral Daily   Continuous Infusions:  PRN Meds:.acetaminophen **OR** [DISCONTINUED] acetaminophen, bisacodyl, HYDROcodone-acetaminophen, [DISCONTINUED] ondansetron **OR** ondansetron (ZOFRAN) IV, senna-docusate  Antibiotics  :    Anti-infectives    None         Objective:   Vitals:   06/29/16 0549 06/29/16 1143 06/29/16 2006 06/30/16 0419  BP: (!) 158/103 129/69 (!) 111/55 (!) 150/91  Pulse:  70 69 77  Resp:  16 18 18   Temp:  98.6 F (37 C) 98.2 F (36.8 C) 97.7 F (36.5 C)  TempSrc:  Oral Oral Oral  SpO2:   97% 97% 98%  Weight:    135 kg (297 lb 11.2 oz)  Height:        Wt Readings from Last 3 Encounters:  06/30/16 135 kg (297 lb 11.2 oz)  05/17/16 (!) 138.2 kg (304 lb 9.6 oz)  01/12/16 136.1 kg (300 lb)     Intake/Output Summary (Last 24 hours) at 06/30/16 1128 Last data filed at 06/30/16 0919  Gross per 24 hour  Intake              960 ml  Output             1000 ml  Net              -40 ml     Physical Exam  Awake Alert, Oriented X 3, No new F.N deficits, Normal affect [place].AT,PERRAL Supple Neck,No JVD, No cervical lymphadenopathy appriciated.  Symmetrical Chest wall movement, Good air movement bilaterally, CTAB RRR,No Gallops,Rubs or new Murmurs, No Parasternal Heave +ve B.Sounds, Abd Soft, No tenderness, No organomegaly appriciated, No rebound - guarding or rigidity. No Cyanosis, Clubbing or edema, No new Rash or bruise    Data Review:    CBC  Recent Labs Lab 06/26/16 1042 06/27/16 0221  WBC 19.3* 13.0*  HGB 12.3 11.9*  HCT 37.4 35.8*  PLT 250 236  MCV 90.8 89.7  MCH 29.9 29.8  MCHC 32.9 33.2  RDW 13.8 14.2    Chemistries   Recent Labs Lab 06/26/16 1042 06/27/16 0221 06/28/16 0203 06/28/16 1848 06/30/16 0152  NA 138 139 136 135 136  K 3.3* 3.5 4.2 4.5 4.4  CL 106 108 105 105 104  CO2 21* 24 21* 22 21*  GLUCOSE 151* 122* 151* 161* 111*  BUN 28* 25* 25* 44* 71*  CREATININE 1.85* 1.83* 2.11* 2.99* 3.23*  CALCIUM 9.0 8.8* 8.8* 8.4* 7.9*  MG  --  1.8  --  1.7  --   AST 16 15  --   --   --   ALT 9* 8*  --   --   --   ALKPHOS 101 87  --   --   --   BILITOT 1.2 0.8  --   --   --    ------------------------------------------------------------------------------------------------------------------ No results for input(s): CHOL, HDL, LDLCALC, TRIG, CHOLHDL, LDLDIRECT in the last 72 hours.  Lab Results  Component Value Date   HGBA1C 5.7 (H) 06/27/2016    ------------------------------------------------------------------------------------------------------------------ No results for input(s): TSH, T4TOTAL, T3FREE, THYROIDAB in the last 72 hours.  Invalid input(s): FREET3 ------------------------------------------------------------------------------------------------------------------ No results for input(s): VITAMINB12, FOLATE, FERRITIN, TIBC, IRON, RETICCTPCT in the last 72 hours.  Coagulation profile No results for input(s): INR, PROTIME in the last 168 hours.  No results for input(s): DDIMER in the last 72 hours.  Cardiac Enzymes  Recent Labs Lab 06/27/16 0221 06/27/16 0740 06/27/16 1424  TROPONINI 0.09* 0.08* 0.09*   ------------------------------------------------------------------------------------------------------------------    Component Value Date/Time   BNP 607.5 (H) 06/26/2016 1042    Micro Results Recent Results (from the past 240 hour(s))  Respiratory Panel by PCR     Status: None   Collection Time: 06/26/16  1:00 AM  Result Value Ref Range Status   Adenovirus NOT DETECTED NOT DETECTED Final   Coronavirus 229E NOT DETECTED NOT DETECTED Final   Coronavirus HKU1 NOT DETECTED NOT DETECTED Final   Coronavirus NL63 NOT DETECTED NOT DETECTED Final   Coronavirus OC43 NOT DETECTED NOT DETECTED Final   Metapneumovirus NOT DETECTED NOT DETECTED Final   Rhinovirus / Enterovirus NOT DETECTED NOT DETECTED Final   Influenza A NOT DETECTED NOT DETECTED Final   Influenza B NOT DETECTED NOT DETECTED Final   Parainfluenza Virus 1 NOT DETECTED NOT DETECTED Final   Parainfluenza Virus 2 NOT DETECTED NOT DETECTED Final   Parainfluenza Virus 3 NOT DETECTED NOT DETECTED Final   Parainfluenza Virus 4 NOT DETECTED NOT DETECTED Final   Respiratory Syncytial Virus NOT DETECTED NOT DETECTED Final   Bordetella pertussis NOT DETECTED NOT DETECTED Final   Chlamydophila pneumoniae NOT DETECTED NOT DETECTED Final   Mycoplasma  pneumoniae NOT DETECTED NOT DETECTED Final  Culture, blood (Routine X 2) w Reflex to ID Panel     Status: None (Preliminary result)   Collection Time: 06/26/16  5:07 PM  Result Value Ref Range Status   Specimen Description BLOOD LEFT FOREARM  Final   Special Requests IN PEDIATRIC BOTTLE Blood Culture adequate volume  Final   Culture NO GROWTH 3 DAYS  Final   Report Status PENDING  Incomplete  Culture, blood (Routine X 2) w Reflex to ID Panel     Status: None (Preliminary result)   Collection Time: 06/26/16  5:22 PM  Result Value Ref Range Status   Specimen Description BLOOD RIGHT ARM  Final   Special Requests   Final    BOTTLES DRAWN AEROBIC ONLY Blood Culture adequate volume   Culture NO GROWTH 3 DAYS  Final   Report Status PENDING  Incomplete  Culture, Urine     Status: None   Collection Time: 06/26/16 10:30 PM  Result Value Ref Range Status   Specimen Description URINE, CATHETERIZED  Final   Special Requests NONE  Final  Culture NO GROWTH  Final   Report Status 06/28/2016 FINAL  Final  Culture, expectorated sputum-assessment     Status: None   Collection Time: 06/27/16  1:55 AM  Result Value Ref Range Status   Specimen Description SPUTUM  Final   Special Requests NONE  Final   Sputum evaluation THIS SPECIMEN IS ACCEPTABLE FOR SPUTUM CULTURE  Final   Report Status 06/27/2016 FINAL  Final  Culture, respiratory (NON-Expectorated)     Status: None   Collection Time: 06/27/16  1:55 AM  Result Value Ref Range Status   Specimen Description SPUTUM  Final   Special Requests NONE Reflexed from [id number]  Final   Gram Stain   Final    FEW WBC PRESENT,BOTH PMN AND MONONUCLEAR FEW GRAM POSITIVE COCCI RARE GRAM NEGATIVE RODS    Culture   Final    MODERATE HAEMOPHILUS INFLUENZAE BETA LACTAMASE NEGATIVE    Report Status 06/28/2016 FINAL  Final  MRSA PCR Screening     Status: None   Collection Time: 06/27/16  9:01 AM  Result Value Ref Range Status   MRSA by PCR NEGATIVE NEGATIVE Final     Comment:        The GeneXpert MRSA Assay (FDA approved for NASAL specimens only), is one component of a comprehensive MRSA colonization surveillance program. It is not intended to diagnose MRSA infection nor to guide or monitor treatment for MRSA infections.     Radiology Reports Dg Chest 2 View  Result Date: 06/26/2016 CLINICAL DATA:  Nausea and vomiting this morning. EXAM: CHEST  2 VIEW COMPARISON:  PA and lateral chest 12/17/2014. FINDINGS: There is cardiomegaly and pulmonary vascular congestion. No consolidative process, pneumothorax or effusion. No focal bony abnormality. IMPRESSION: Cardiomegaly and pulmonary vascular congestion. Electronically Signed   By: Inge Rise M.D.   On: 06/26/2016 11:09   US Renal  Result Date: 06/30/2016 CLINICAL DATA:  Acute renal failure. EXAM: RENAL / URINARY TRACT ULTRASOUND COMPLETE COMPARISON:  Ultrasound of December 14, 2011. FINDINGS: Right Kidney: Length: 11.6 cm. Increased echogenicity of renal parenchyma is noted with cortical thinning. No mass or hydronephrosis visualized. Left Kidney: Length: 10.4 cm. Increased echogenicity of renal parenchyma is noted with cortical thinning. 1.1 cm simple cyst is noted. No mass or hydronephrosis visualized. Bladder: Appears normal for degree of bladder distention. IMPRESSION: Increased echogenicity of renal parenchyma is noted bilaterally suggesting medical renal disease. No hydronephrosis or renal obstruction is noted. Electronically Signed   By: Marijo Conception, M.D.   On: 06/30/2016 09:57   Dg Chest Port 1 View  Result Date: 06/29/2016 CLINICAL DATA:  Shortness of Breath EXAM: PORTABLE CHEST 1 VIEW COMPARISON:  Jun 28, 2016 FINDINGS: There is no edema or consolidation. Heart is mildly enlarged with pulmonary vascularity within normal limits. No adenopathy. No bone lesions. IMPRESSION: Mild cardiomegaly.  No edema or consolidation. Electronically Signed   By: Lowella Grip III M.D.   On: 06/29/2016  07:10   Dg Chest Port 1 View  Result Date: 06/28/2016 CLINICAL DATA:  Cough and leg swelling EXAM: PORTABLE CHEST 1 VIEW COMPARISON:  06/26/2016 FINDINGS: Cardiac shadow is mildly enlarged but stable. The lungs are well aerated bilaterally. Mild vascular congestion remains. No focal infiltrate or sizable effusion is noted. No bony abnormality is seen. IMPRESSION: Stable mild vascular congestion. Electronically Signed   By: Inez Catalina M.D.   On: 06/28/2016 09:59    Time Spent in minutes  30   Lala Lund M.D on 06/30/2016 at 11:28  AM  Between 7am to 7pm - Pager - [phone] ( page via [email or web address], text pages only, please mention full 10 digit call back number). After 7pm go to [email or web address] - password [facility]

## 2016-07-01 LAB — BASIC METABOLIC PANEL
ANION GAP: 11 (ref 5–15)
BUN: 75 mg/dL — ABNORMAL HIGH (ref 6–20)
CALCIUM: 7.4 mg/dL — AB (ref 8.9–10.3)
CO2: 20 mmol/L — ABNORMAL LOW (ref 22–32)
Chloride: 106 mmol/L (ref 101–111)
Creatinine, Ser: 3.37 mg/dL — ABNORMAL HIGH (ref 0.44–1.00)
GFR, EST AFRICAN AMERICAN: 18 mL/min — AB (ref >60)
GFR, EST NON AFRICAN AMERICAN: 16 mL/min — AB (ref >60)
Glucose, Bld: 116 mg/dL — ABNORMAL HIGH (ref 65–99)
POTASSIUM: 3.9 mmol/L (ref 3.5–5.1)
Sodium: 137 mmol/L (ref 135–145)

## 2016-07-01 LAB — CBC
HEMATOCRIT: 32.9 % — AB (ref 36.0–46.0)
HEMOGLOBIN: 10.5 g/dL — AB (ref 12.0–15.0)
MCH: 29.1 pg (ref 26.0–34.0)
MCHC: 31.9 g/dL (ref 30.0–36.0)
MCV: 91.1 fL (ref 78.0–100.0)
Platelets: 249 10*3/uL (ref 150–400)
RBC: 3.61 MIL/uL — AB (ref 3.87–5.11)
RDW: 14.7 % (ref 11.5–15.5)
WBC: 9.7 10*3/uL (ref 4.0–10.5)

## 2016-07-01 LAB — CULTURE, BLOOD (ROUTINE X 2)
CULTURE: NO GROWTH
Culture: NO GROWTH
SPECIAL REQUESTS: ADEQUATE
Special Requests: ADEQUATE

## 2016-07-01 LAB — GLUCOSE, CAPILLARY
GLUCOSE-CAPILLARY: 100 mg/dL — AB (ref 65–99)
GLUCOSE-CAPILLARY: 107 mg/dL — AB (ref 65–99)
GLUCOSE-CAPILLARY: 79 mg/dL (ref 65–99)

## 2016-07-01 LAB — MAGNESIUM: MAGNESIUM: 2 mg/dL (ref 1.7–2.4)

## 2016-07-01 NOTE — Progress Notes (Signed)
[name] Progress Note   Subjective: no c/o, creat 3.2 > 3.3 today.  No new c/o's, up in chair and stable  Vitals:   07/01/16 0401 07/01/16 0818 07/01/16 1001 07/01/16 1216  BP: 120/79 (!) 163/86 (!) 148/84 114/76  Pulse: 64 61 83 69  Resp: 18 20 20 20   Temp: 97.6 F (36.4 C) 98.2 F (36.8 C) 98.4 F (36.9 C) 98.5 F (36.9 C)  TempSrc: Oral Oral Oral Oral  SpO2: 98% 99% 98% 94%  Weight: (!) 136.1 kg (300 lb 1.6 oz)     Height:        Inpatient medications: . allopurinol  100 mg Oral Daily  . amLODipine  10 mg Oral Daily  . apixaban  5 mg Oral BID  . aspirin EC  81 mg Oral Daily  . carvedilol  12.5 mg Oral BID WC  . cloNIDine  0.3 mg Oral TID  . colchicine  0.6 mg Oral Daily  . folic acid  1 mg Oral Daily  . hydrALAZINE  100 mg Oral TID  . minoxidil  2.5 mg Oral Daily  . pantoprazole  40 mg Oral Daily   . sodium chloride 75 mL/hr at 06/30/16 1811   acetaminophen **OR** [DISCONTINUED] acetaminophen, bisacodyl, HYDROcodone-acetaminophen, [DISCONTINUED] ondansetron **OR** ondansetron (ZOFRAN) IV, senna-docusate  Exam: Gen obese AAF, calm and pleasant No jvd or bruits Chest clear bilat to bases RRR no MRG Abd soft ntnd no mass or ascites +bs markedly obese Ext 1+pitting LLE edema , minimal RLE edema Chronic skin changes LLE / no wounds or ulcers Neuro is alert, Ox 3 , nf, pleasant  UA > negative Renal US > ^'d echo bilat, some cortical thinning, no hydro. 10.4/ 11.6 cm.    Assessment: 1  Acute renal insufficiency - in pt with CKD 3/4 admitted for HTN'sive crisis. Was diuresed here, most likely cause. Has mostly R HF history and is 10 lbs under usual home weight.  Started IVF"s yest and diuretics being held, creat leveling off today.  Cont IVF's, holding diuretics.  2  CKD stage 3/4 - baseline creat 1.5- 2.0.  Prob due to HTN +/- cardiorenal 3  Diast CHF/ severe pulm HTN  4  Morbid obesity 5  HTN - severe, long-standing. Controlled on 4 home  meds (clon/ hydral/ coreg/ norvasc) and minoxidil low dose added here.   Plan - as above   Kelly Splinter MD [name] pager [phone]   07/01/2016, 2:01 PM    Recent Labs Lab 06/27/16 0221  06/28/16 1848 06/30/16 0152 07/01/16 0344  NA 139  < > 135 136 137  K 3.5  < > 4.5 4.4 3.9  CL 108  < > 105 104 106  CO2 24  < > 22 21* 20*  GLUCOSE 122*  < > 161* 111* 116*  BUN 25*  < > 44* 71* 75*  CREATININE 1.83*  < > 2.99* 3.23* 3.37*  CALCIUM 8.8*  < > 8.4* 7.9* 7.4*  PHOS 2.2*  --   --   --   --   < > = values in this interval not displayed.  Recent Labs Lab 06/26/16 1042 06/27/16 0221  AST 16 15  ALT 9* 8*  ALKPHOS 101 87  BILITOT 1.2 0.8  PROT 9.7* 8.4*  ALBUMIN 3.4* 3.1*    Recent Labs Lab 06/26/16 1042 06/27/16 0221 07/01/16 0344  WBC 19.3* 13.0* 9.7  HGB 12.3 11.9* 10.5*  HCT 37.4 35.8* 32.9*  MCV 90.8 89.7  91.1  PLT 250 236 249   Iron/TIBC/Ferritin/ %Sat    Component Value Date/Time   IRON 49 12/11/2011 0152   TIBC 353 12/11/2011 0152   FERRITIN 76 12/11/2011 0152   IRONPCTSAT 14 (L) 12/11/2011 9753

## 2016-07-01 NOTE — Progress Notes (Signed)
PROGRESS NOTE                                                                                                                                                                                                             Patient Demographics:    [name], is a 46 y.o. female, DOB - [birth year], GEX:[id number]  Admit date - 06/26/2016   Admitting Physician [name], MD  Outpatient Primary MD for the patient is [name], MD  LOS - 5  Chief Complaint  Patient presents with  . Hypertension  . Nausea       Brief Narrative   46 year old female with PMH of anemia, gout, A.Flutter (on Eliquis), Diastolic HF, CKD Stage 3, HTN, Gout, Morbid Obesity, and Pulmonary HTN.  Managed for HTN crisis / emergency, Was admitted by ICU team and required my primary drip.   Subjective:   Patient in bed, appears comfortable, denies any headache, no fever, no chest pain or pressure, no shortness of breath , no abdominal pain. No focal weakness.   Assessment  & Plan :     1. Hypertensive crisis. Causing acute on chronic diastolic CHF EF 01% also has underlying pulmonary hypertension. Initially was in ICU and on NTG drip - Blood pressure now stable on combination of Norvasc, Coreg, clonidine, hydralazine and minoxidil. Continue to monitor  2. Acute to chronic diastolic CHF EF 02% due to #1 above. Has been diuresed, now off diuretics due to renal failure and compensated.  3. ARF once daily stage IV. Baseline creatinine close to 2, likely worse due to overdiuresis, diuretics held IV fluids continue, renal ultrasound stable and renal following.  4. Anemia of chronic disease. Stable no acute issues.  5. Pulmonary hypertension. Outpatient follow-up with pulmonary.  6. Chronic atrial fibrillation [place] vasc 2 score of at least 3. Continue Coreg along with Eliquis.  7. Morbid obesity. Follow with PCP for weight loss.  8. GERD. On  PPI.    Diet : Diet Heart Room service appropriate? Yes; Fluid consistency: Thin; Fluid restriction: 1500 mL Fluid    Family Communication  :  None  Code Status :  Full  Disposition Plan  :  Home 1-2 days  Consults  :  PCCM, nephrology  Procedures  :    TTE - Left ventricle: The cavity size  was normal. Wall thickness was increased in a pattern of severe LVH. Systolic function was normal. The estimated ejection fraction was in the range of 55%  to 60%. The study is not technically sufficient to allow evaluation of LV diastolic function. - Left atrium: The atrium was severely dilated. - Atrial septum: No defect or patent foramen ovale was identified. - Pericardium, extracardiac: A trivial pericardial effusion was identified.  Renal ultrasound. Non acute    DVT Prophylaxis  :  Eliquis  Lab Results  Component Value Date   PLT 249 07/01/2016    Inpatient Medications  Scheduled Meds: . allopurinol  100 mg Oral Daily  . amLODipine  10 mg Oral Daily  . apixaban  5 mg Oral BID  . aspirin EC  81 mg Oral Daily  . carvedilol  12.5 mg Oral BID WC  . cloNIDine  0.3 mg Oral TID  . colchicine  0.6 mg Oral Daily  . folic acid  1 mg Oral Daily  . hydrALAZINE  100 mg Oral TID  . insulin aspart  2-6 Units Subcutaneous Q4H  . minoxidil  2.5 mg Oral Daily  . pantoprazole  40 mg Oral Daily   Continuous Infusions: . sodium chloride 75 mL/hr at 06/30/16 1811   PRN Meds:.acetaminophen **OR** [DISCONTINUED] acetaminophen, bisacodyl, HYDROcodone-acetaminophen, [DISCONTINUED] ondansetron **OR** ondansetron (ZOFRAN) IV, senna-docusate  Antibiotics  :    Anti-infectives    None         Objective:   Vitals:   06/30/16 2249 07/01/16 0030 07/01/16 0401 07/01/16 0818  BP: 122/74 120/78 120/79 (!) 163/86  Pulse: 74 80 64 61  Resp: 20 20 18 20   Temp: 98.3 F (36.8 C) 98 F (36.7 C) 97.6 F (36.4 C) 98.2 F (36.8 C)  TempSrc: Oral Oral Oral Oral  SpO2: 97% 98% 98% 99%  Weight:    (!) 136.1 kg (300 lb 1.6 oz)   Height:        Wt Readings from Last 3 Encounters:  07/01/16 (!) 136.1 kg (300 lb 1.6 oz)  05/17/16 (!) 138.2 kg (304 lb 9.6 oz)  01/12/16 136.1 kg (300 lb)     Intake/Output Summary (Last 24 hours) at 07/01/16 0936 Last data filed at 07/01/16 0401  Gross per 24 hour  Intake              600 ml  Output              550 ml  Net               50 ml     Physical Exam  Awake Alert, Oriented X 3, No new F.N deficits, Normal affect [place].AT,PERRAL Supple Neck,No JVD, No cervical lymphadenopathy appriciated.  Symmetrical Chest wall movement, Good air movement bilaterally, CTAB RRR,No Gallops,Rubs or new Murmurs, No Parasternal Heave +ve B.Sounds, Abd Soft, No tenderness, No organomegaly appriciated, No rebound - guarding or rigidity. No Cyanosis, Clubbing or edema, No new Rash or bruise    Data Review:    CBC  Recent Labs Lab 06/26/16 1042 06/27/16 0221 07/01/16 0344  WBC 19.3* 13.0* 9.7  HGB 12.3 11.9* 10.5*  HCT 37.4 35.8* 32.9*  PLT 250 236 249  MCV 90.8 89.7 91.1  MCH 29.9 29.8 29.1  MCHC 32.9 33.2 31.9  RDW 13.8 14.2 14.7    Chemistries   Recent Labs Lab 06/26/16 1042 06/27/16 0221 06/28/16 0203 06/28/16 1848 06/30/16 0152 07/01/16 0344  NA 138 139 136 135 136 137  K  3.3* 3.5 4.2 4.5 4.4 3.9  CL 106 108 105 105 104 106  CO2 21* 24 21* 22 21* 20*  GLUCOSE 151* 122* 151* 161* 111* 116*  BUN 28* 25* 25* 44* 71* 75*  CREATININE 1.85* 1.83* 2.11* 2.99* 3.23* 3.37*  CALCIUM 9.0 8.8* 8.8* 8.4* 7.9* 7.4*  MG  --  1.8  --  1.7  --  2.0  AST 16 15  --   --   --   --   ALT 9* 8*  --   --   --   --   ALKPHOS 101 87  --   --   --   --   BILITOT 1.2 0.8  --   --   --   --    ------------------------------------------------------------------------------------------------------------------ No results for input(s): CHOL, HDL, LDLCALC, TRIG, CHOLHDL, LDLDIRECT in the last 72 hours.  Lab Results  Component Value Date   HGBA1C 5.7  (H) 06/27/2016   ------------------------------------------------------------------------------------------------------------------ No results for input(s): TSH, T4TOTAL, T3FREE, THYROIDAB in the last 72 hours.  Invalid input(s): FREET3 ------------------------------------------------------------------------------------------------------------------ No results for input(s): VITAMINB12, FOLATE, FERRITIN, TIBC, IRON, RETICCTPCT in the last 72 hours.  Coagulation profile No results for input(s): INR, PROTIME in the last 168 hours.  No results for input(s): DDIMER in the last 72 hours.  Cardiac Enzymes  Recent Labs Lab 06/27/16 0221 06/27/16 0740 06/27/16 1424  TROPONINI 0.09* 0.08* 0.09*   ------------------------------------------------------------------------------------------------------------------    Component Value Date/Time   BNP 607.5 (H) 06/26/2016 1042    Micro Results Recent Results (from the past 240 hour(s))  Respiratory Panel by PCR     Status: None   Collection Time: 06/26/16  1:00 AM  Result Value Ref Range Status   Adenovirus NOT DETECTED NOT DETECTED Final   Coronavirus 229E NOT DETECTED NOT DETECTED Final   Coronavirus HKU1 NOT DETECTED NOT DETECTED Final   Coronavirus NL63 NOT DETECTED NOT DETECTED Final   Coronavirus OC43 NOT DETECTED NOT DETECTED Final   Metapneumovirus NOT DETECTED NOT DETECTED Final   Rhinovirus / Enterovirus NOT DETECTED NOT DETECTED Final   Influenza A NOT DETECTED NOT DETECTED Final   Influenza B NOT DETECTED NOT DETECTED Final   Parainfluenza Virus 1 NOT DETECTED NOT DETECTED Final   Parainfluenza Virus 2 NOT DETECTED NOT DETECTED Final   Parainfluenza Virus 3 NOT DETECTED NOT DETECTED Final   Parainfluenza Virus 4 NOT DETECTED NOT DETECTED Final   Respiratory Syncytial Virus NOT DETECTED NOT DETECTED Final   Bordetella pertussis NOT DETECTED NOT DETECTED Final   Chlamydophila pneumoniae NOT DETECTED NOT DETECTED Final    Mycoplasma pneumoniae NOT DETECTED NOT DETECTED Final  Culture, blood (Routine X 2) w Reflex to ID Panel     Status: None (Preliminary result)   Collection Time: 06/26/16  5:07 PM  Result Value Ref Range Status   Specimen Description BLOOD LEFT FOREARM  Final   Special Requests IN PEDIATRIC BOTTLE Blood Culture adequate volume  Final   Culture NO GROWTH 4 DAYS  Final   Report Status PENDING  Incomplete  Culture, blood (Routine X 2) w Reflex to ID Panel     Status: None (Preliminary result)   Collection Time: 06/26/16  5:22 PM  Result Value Ref Range Status   Specimen Description BLOOD RIGHT ARM  Final   Special Requests   Final    BOTTLES DRAWN AEROBIC ONLY Blood Culture adequate volume   Culture NO GROWTH 4 DAYS  Final   Report Status PENDING  Incomplete  Culture, Urine     Status: None   Collection Time: 06/26/16 10:30 PM  Result Value Ref Range Status   Specimen Description URINE, CATHETERIZED  Final   Special Requests NONE  Final   Culture NO GROWTH  Final   Report Status 06/28/2016 FINAL  Final  Culture, expectorated sputum-assessment     Status: None   Collection Time: 06/27/16  1:55 AM  Result Value Ref Range Status   Specimen Description SPUTUM  Final   Special Requests NONE  Final   Sputum evaluation THIS SPECIMEN IS ACCEPTABLE FOR SPUTUM CULTURE  Final   Report Status 06/27/2016 FINAL  Final  Culture, respiratory (NON-Expectorated)     Status: None   Collection Time: 06/27/16  1:55 AM  Result Value Ref Range Status   Specimen Description SPUTUM  Final   Special Requests NONE Reflexed from [id number]  Final   Gram Stain   Final    FEW WBC PRESENT,BOTH PMN AND MONONUCLEAR FEW GRAM POSITIVE COCCI RARE GRAM NEGATIVE RODS    Culture   Final    MODERATE HAEMOPHILUS INFLUENZAE BETA LACTAMASE NEGATIVE    Report Status 06/28/2016 FINAL  Final  MRSA PCR Screening     Status: None   Collection Time: 06/27/16  9:01 AM  Result Value Ref Range Status   MRSA by PCR NEGATIVE  NEGATIVE Final    Comment:        The GeneXpert MRSA Assay (FDA approved for NASAL specimens only), is one component of a comprehensive MRSA colonization surveillance program. It is not intended to diagnose MRSA infection nor to guide or monitor treatment for MRSA infections.     Radiology Reports Dg Chest 2 View  Result Date: 06/26/2016 CLINICAL DATA:  Nausea and vomiting this morning. EXAM: CHEST  2 VIEW COMPARISON:  PA and lateral chest 12/17/2014. FINDINGS: There is cardiomegaly and pulmonary vascular congestion. No consolidative process, pneumothorax or effusion. No focal bony abnormality. IMPRESSION: Cardiomegaly and pulmonary vascular congestion. Electronically Signed   By: Inge Rise M.D.   On: 06/26/2016 11:09   US Renal  Result Date: 06/30/2016 CLINICAL DATA:  Acute renal failure. EXAM: RENAL / URINARY TRACT ULTRASOUND COMPLETE COMPARISON:  Ultrasound of December 14, 2011. FINDINGS: Right Kidney: Length: 11.6 cm. Increased echogenicity of renal parenchyma is noted with cortical thinning. No mass or hydronephrosis visualized. Left Kidney: Length: 10.4 cm. Increased echogenicity of renal parenchyma is noted with cortical thinning. 1.1 cm simple cyst is noted. No mass or hydronephrosis visualized. Bladder: Appears normal for degree of bladder distention. IMPRESSION: Increased echogenicity of renal parenchyma is noted bilaterally suggesting medical renal disease. No hydronephrosis or renal obstruction is noted. Electronically Signed   By: Marijo Conception, M.D.   On: 06/30/2016 09:57   Dg Chest Port 1 View  Result Date: 06/29/2016 CLINICAL DATA:  Shortness of Breath EXAM: PORTABLE CHEST 1 VIEW COMPARISON:  Jun 28, 2016 FINDINGS: There is no edema or consolidation. Heart is mildly enlarged with pulmonary vascularity within normal limits. No adenopathy. No bone lesions. IMPRESSION: Mild cardiomegaly.  No edema or consolidation. Electronically Signed   By: Lowella Grip III M.D.   On:  06/29/2016 07:10   Dg Chest Port 1 View  Result Date: 06/28/2016 CLINICAL DATA:  Cough and leg swelling EXAM: PORTABLE CHEST 1 VIEW COMPARISON:  06/26/2016 FINDINGS: Cardiac shadow is mildly enlarged but stable. The lungs are well aerated bilaterally. Mild vascular congestion remains. No focal infiltrate or sizable effusion is noted. No bony  abnormality is seen. IMPRESSION: Stable mild vascular congestion. Electronically Signed   By: Inez Catalina M.D.   On: 06/28/2016 09:59    Time Spent in minutes  30   Lala Lund M.D on 07/01/2016 at 9:36 AM  Between 7am to 7pm - Pager - [phone] ( page via [email or web address], text pages only, please mention full 10 digit call back number). After 7pm go to [email or web address] - password [facility]

## 2016-07-02 LAB — BASIC METABOLIC PANEL
ANION GAP: 8 (ref 5–15)
BUN: 67 mg/dL — AB (ref 6–20)
CALCIUM: 7.6 mg/dL — AB (ref 8.9–10.3)
CO2: 21 mmol/L — AB (ref 22–32)
Chloride: 110 mmol/L (ref 101–111)
Creatinine, Ser: 2.88 mg/dL — ABNORMAL HIGH (ref 0.44–1.00)
GFR calc Af Amer: 22 mL/min — ABNORMAL LOW (ref >60)
GFR, EST NON AFRICAN AMERICAN: 19 mL/min — AB (ref >60)
GLUCOSE: 107 mg/dL — AB (ref 65–99)
POTASSIUM: 4.2 mmol/L (ref 3.5–5.1)
Sodium: 139 mmol/L (ref 135–145)

## 2016-07-02 MED ORDER — TORSEMIDE 20 MG PO TABS: 40.0000 mg | ORAL_TABLET | Freq: Every day | ORAL | Status: DC

## 2016-07-02 MED ORDER — MINOXIDIL 2.5 MG PO TABS: 2.5000 mg | ORAL_TABLET | Freq: Every day | ORAL | 0 refills | Status: DC

## 2016-07-02 MED ORDER — POTASSIUM CHLORIDE CRYS ER 20 MEQ PO TBCR: 20.0000 meq | EXTENDED_RELEASE_TABLET | Freq: Every day | ORAL | 3 refills | Status: DC

## 2016-07-02 NOTE — Progress Notes (Signed)
Reviewed all discharge instructions with patient and she stated understanding.  She confirmed she has cell phone and clothing as only personal belongings she brought to hospital.  No voiced complaints.  Awaiting her transport ride home.

## 2016-07-02 NOTE — Discharge Summary (Signed)
[name]:[id number] DOB: 09/16/1970 DOA: 06/26/2016  PCP: [name], MD  Admit date: 06/26/2016  Discharge date: 07/02/2016  Admitted From: Home   Disposition:  Home   Recommendations for Outpatient Follow-up:   Follow up with PCP in 1-2 weeks  PCP Please obtain BMP/CBC, 2 view CXR in 1week,  (see Discharge instructions)   PCP Please follow up on the following pending results: Monitor BMP, needs outpatient renal follow-up and also may benefit from renal artery stenosis workup   Home Health: None Equipment/Devices: None  Consultations: Renal, PC CM Discharge Condition: Stable   CODE STATUS: Full   Diet Recommendation:  Heart Healthy with 1.5 L daily fluid restriction   Chief Complaint  Patient presents with  . Hypertension  . Nausea     Brief history of present illness from the day of admission and additional interim summary    46 year old female with PMH of anemia, gout, A.Flutter (on Eliquis), Diastolic HF, CKD Stage 3, HTN, Gout, Morbid Obesity, and Pulmonary HTN. Managed for HTN crisis / emergency, Was admitted by ICU team and required my primary drip.                                                                 Hospital Course   1. Hypertensive crisis. Causing acute on chronic diastolic CHF EF 50% also has underlying pulmonary hypertension. Initially was in ICU and on NTG drip - Blood pressure now stable on combination of Norvasc, Coreg, clonidine, hydralazineAnd newly added minoxidil, request PCP to continue to monitor blood pressure and adjust medications as needed, consider outpatient renal artery stenosis workup.  2. Acute to chronic diastolic CHF EF 03% due to #1 above. Was diuresed and now compensated.  3. ARF once CKD IV. Baseline creatinine close to 2, likely worse due to  overdiuresis, diuretics were held, she was hydrated with IV fluids, renal ultrasound was stable, she was seen by nephrology and cleared for home discharge today with instruction to start her diuretics and potassium tomorrow but continue to hold Aldactone, request PCP to check BMP in 3-4 days and arrange for outpatient nephrology follow-up as well for CK D4.  4. Anemia of chronic disease. Stable no acute issues.  5. Pulmonary hypertension. Outpatient follow-up with pulmonary.  6. Chronic atrial fibrillation [place] vasc 2 score of at least 3. Continue Coreg along with Eliquis.  7. Morbid obesity. Follow with PCP for weight loss.  8. GERD. On PPI.     Discharge diagnosis     Active Problems:   Accelerated hypertension   Atrial fibrillation with controlled ventricular response (HCC)   Morbid obesity ([place])   Pulmonary hypertension (HCC)   Morbid obesity with BMI of 50.0-59.9, adult (HCC)   Anemia   Chronic diastolic heart failure ([place])  HTN (hypertension)   CKD (chronic kidney disease) stage 3, GFR 30-59 ml/min   Hypertensive emergency   Hypertensive urgency   Acute on chronic congestive heart failure ([place])   Acute pulmonary edema (HCC)    Discharge instructions    Discharge Instructions    Diet - low sodium heart healthy    Complete by:  As directed    Discharge instructions    Complete by:  As directed    Follow with Primary MD [name], MD in 2-3 days   Get CBC, CMP, 2 view Chest X ray checked  by Primary MD or SNF MD in 2-3 days ( we routinely change or add medications that can affect your baseline labs and fluid status, therefore we recommend that you get the mentioned basic workup next visit with your PCP, your PCP may decide not to get them or add new tests based on their clinical decision)  Activity: As tolerated with Full fall precautions use walker/cane & assistance as needed  Disposition Home    Diet:   Heart Healthy - Check your Weight same time  everyday, if you gain over 2 pounds, or you develop in leg swelling, experience more shortness of breath or chest pain, call your Primary MD immediately. Follow Cardiac Low Salt Diet and 1.5 lit/day fluid restriction.  On your next visit with your primary care physician please Get Medicines reviewed and adjusted.  Please request your Prim.MD to go over all Hospital Tests and Procedure/Radiological results at the follow up, please get all Hospital records sent to your Prim MD by signing hospital release before you go home.  If you experience worsening of your admission symptoms, develop shortness of breath, life threatening emergency, suicidal or homicidal thoughts you must seek medical attention immediately by calling 911 or calling your MD immediately  if symptoms less severe.  You Must read complete instructions/literature along with all the possible adverse reactions/side effects for all the Medicines you take and that have been prescribed to you. Take any new Medicines after you have completely understood and accpet all the possible adverse reactions/side effects.   Do not drive, operate heavy machinery, perform activities at heights, swimming or participation in water activities or provide baby sitting services if your were admitted for syncope or siezures until you have seen by Primary MD or a Neurologist and advised to do so again.  Do not drive when taking Pain medications.    Do not take more than prescribed Pain, Sleep and Anxiety Medications  Special Instructions: If you have smoked or chewed Tobacco  in the last 2 yrs please stop smoking, stop any regular Alcohol  and or any Recreational drug use.  Wear Seat belts while driving.   Please note  You were cared for by a hospitalist during your hospital stay. If you have any questions about your discharge medications or the care you received while you were in the hospital after you are discharged, you can call the unit and asked to  speak with the hospitalist on call if the hospitalist that took care of you is not available. Once you are discharged, your primary care physician will handle any further medical issues. Please note that NO REFILLS for any discharge medications will be authorized once you are discharged, as it is imperative that you return to your primary care physician (or establish a relationship with a primary care physician if you do not have one) for your aftercare needs so that they can reassess your need  for medications and monitor your lab values.   Increase activity slowly    Complete by:  As directed       Discharge Medications   Allergies as of 07/02/2016      Reactions   Penicillins Hives   Childhood allergy. Has patient had a PCN reaction causing immediate rash, facial/tongue/throat swelling, SOB or lightheadedness with hypotension: NO Has patient had a PCN reaction causing severe rash involving mucus membranes or skin necrosis: No NO Has patient had a PCN reaction that required hospitalization No NO Has patient had a PCN reaction occurring within the last 10 years: NO If all of the above answers are "NO", then may pro      Medication List    STOP taking these medications   carvedilol 6.25 MG tablet Commonly known as:  COREG   spironolactone 25 MG tablet Commonly known as:  ALDACTONE     TAKE these medications   acetaminophen 325 MG tablet Commonly known as:  TYLENOL Take 650 mg by mouth every 6 (six) hours as needed for mild pain or headache.   allopurinol 100 MG tablet Commonly known as:  ZYLOPRIM Take 100 mg by mouth daily.   amLODipine 10 MG tablet Commonly known as:  NORVASC Take 1 tablet (10 mg total) by mouth daily.   cloNIDine 0.2 MG tablet Commonly known as:  CATAPRES Take 2 tablets (0.4 mg total) by mouth 3 (three) times daily. May take an additional tab prn for SBP>180   colchicine 0.6 MG tablet Take 0.6 mg by mouth daily as needed (gouty flare).   ELIQUIS 5 MG  Tabs tablet Generic drug:  apixaban Take 5 mg by mouth 2 (two) times daily.   folic acid 1 MG tablet Commonly known as:  FOLVITE Take 1 tablet (1 mg total) by mouth daily.   hydrALAZINE 100 MG tablet Commonly known as:  APRESOLINE Take 1 tablet (100 mg total) by mouth 3 (three) times daily.   loratadine 10 MG tablet Commonly known as:  CLARITIN Take 10 mg by mouth daily.   minoxidil 2.5 MG tablet Commonly known as:  LONITEN Take 1 tablet (2.5 mg total) by mouth daily.   oxyCODONE-acetaminophen 5-325 MG tablet Commonly known as:  PERCOCET/ROXICET Take 1 tablet by mouth every 6 (six) hours as needed for severe pain.   pantoprazole 40 MG tablet Commonly known as:  PROTONIX Take 1 tablet (40 mg total) by mouth daily.   potassium chloride SA 20 MEQ tablet Commonly known as:  K-DUR,KLOR-CON Take 1 tablet (20 mEq total) by mouth daily. Start taking on:  07/03/2016   torsemide 20 MG tablet Commonly known as:  DEMADEX Take 2 tablets (40 mg total) by mouth daily. Take an additional 20 mg (1 tablet) in the afternoon for swelling in the legs or shortness of breath Start taking on:  07/03/2016       Follow-up Information    [name], MD. Schedule an appointment as soon as possible for a visit in 3 day(s).   Specialty:  Family Medicine Contact information: [address] [place] [address] [phone]           Major procedures and Radiology Reports - PLEASE review detailed and final reports thoroughly  -      TTE - Left ventricle: The cavity size was normal. Wall thickness wasincreased in a pattern of severe LVH. Systolic function wasnormal. The estimated ejection fraction was in the range of 55%to 60%. The study is not  technically sufficient to allowevaluation of LV diastolic function. - Left atrium: The atrium was severely dilated. - Atrial septum: No defect or patent foramen ovale was identified. - Pericardium, extracardiac: A trivial pericardial  effusion wasidentified.  Renal ultrasound. Non acute  Dg Chest 2 View  Result Date: 06/26/2016 CLINICAL DATA:  Nausea and vomiting this morning. EXAM: CHEST  2 VIEW COMPARISON:  PA and lateral chest 12/17/2014. FINDINGS: There is cardiomegaly and pulmonary vascular congestion. No consolidative process, pneumothorax or effusion. No focal bony abnormality. IMPRESSION: Cardiomegaly and pulmonary vascular congestion. Electronically Signed   By: Inge Rise M.D.   On: 06/26/2016 11:09   US Renal  Result Date: 06/30/2016 CLINICAL DATA:  Acute renal failure. EXAM: RENAL / URINARY TRACT ULTRASOUND COMPLETE COMPARISON:  Ultrasound of December 14, 2011. FINDINGS: Right Kidney: Length: 11.6 cm. Increased echogenicity of renal parenchyma is noted with cortical thinning. No mass or hydronephrosis visualized. Left Kidney: Length: 10.4 cm. Increased echogenicity of renal parenchyma is noted with cortical thinning. 1.1 cm simple cyst is noted. No mass or hydronephrosis visualized. Bladder: Appears normal for degree of bladder distention. IMPRESSION: Increased echogenicity of renal parenchyma is noted bilaterally suggesting medical renal disease. No hydronephrosis or renal obstruction is noted. Electronically Signed   By: Marijo Conception, M.D.   On: 06/30/2016 09:57   Dg Chest Port 1 View  Result Date: 06/29/2016 CLINICAL DATA:  Shortness of Breath EXAM: PORTABLE CHEST 1 VIEW COMPARISON:  Jun 28, 2016 FINDINGS: There is no edema or consolidation. Heart is mildly enlarged with pulmonary vascularity within normal limits. No adenopathy. No bone lesions. IMPRESSION: Mild cardiomegaly.  No edema or consolidation. Electronically Signed   By: Lowella Grip III M.D.   On: 06/29/2016 07:10   Dg Chest Port 1 View  Result Date: 06/28/2016 CLINICAL DATA:  Cough and leg swelling EXAM: PORTABLE CHEST 1 VIEW COMPARISON:  06/26/2016 FINDINGS: Cardiac shadow is mildly enlarged but stable. The lungs are well aerated  bilaterally. Mild vascular congestion remains. No focal infiltrate or sizable effusion is noted. No bony abnormality is seen. IMPRESSION: Stable mild vascular congestion. Electronically Signed   By: Inez Catalina M.D.   On: 06/28/2016 09:59    Micro Results     Recent Results (from the past 240 hour(s))  Respiratory Panel by PCR     Status: None   Collection Time: 06/26/16  1:00 AM  Result Value Ref Range Status   Adenovirus NOT DETECTED NOT DETECTED Final   Coronavirus 229E NOT DETECTED NOT DETECTED Final   Coronavirus HKU1 NOT DETECTED NOT DETECTED Final   Coronavirus NL63 NOT DETECTED NOT DETECTED Final   Coronavirus OC43 NOT DETECTED NOT DETECTED Final   Metapneumovirus NOT DETECTED NOT DETECTED Final   Rhinovirus / Enterovirus NOT DETECTED NOT DETECTED Final   Influenza A NOT DETECTED NOT DETECTED Final   Influenza B NOT DETECTED NOT DETECTED Final   Parainfluenza Virus 1 NOT DETECTED NOT DETECTED Final   Parainfluenza Virus 2 NOT DETECTED NOT DETECTED Final   Parainfluenza Virus 3 NOT DETECTED NOT DETECTED Final   Parainfluenza Virus 4 NOT DETECTED NOT DETECTED Final   Respiratory Syncytial Virus NOT DETECTED NOT DETECTED Final   Bordetella pertussis NOT DETECTED NOT DETECTED Final   Chlamydophila pneumoniae NOT DETECTED NOT DETECTED Final   Mycoplasma pneumoniae NOT DETECTED NOT DETECTED Final  Culture, blood (Routine X 2) w Reflex to ID Panel     Status: None   Collection Time: 06/26/16  5:07 PM  Result  Value Ref Range Status   Specimen Description BLOOD LEFT FOREARM  Final   Special Requests IN PEDIATRIC BOTTLE Blood Culture adequate volume  Final   Culture NO GROWTH 5 DAYS  Final   Report Status 07/01/2016 FINAL  Final  Culture, blood (Routine X 2) w Reflex to ID Panel     Status: None   Collection Time: 06/26/16  5:22 PM  Result Value Ref Range Status   Specimen Description BLOOD RIGHT ARM  Final   Special Requests   Final    BOTTLES DRAWN AEROBIC ONLY Blood Culture  adequate volume   Culture NO GROWTH 5 DAYS  Final   Report Status 07/01/2016 FINAL  Final  Culture, Urine     Status: None   Collection Time: 06/26/16 10:30 PM  Result Value Ref Range Status   Specimen Description URINE, CATHETERIZED  Final   Special Requests NONE  Final   Culture NO GROWTH  Final   Report Status 06/28/2016 FINAL  Final  Culture, expectorated sputum-assessment     Status: None   Collection Time: 06/27/16  1:55 AM  Result Value Ref Range Status   Specimen Description SPUTUM  Final   Special Requests NONE  Final   Sputum evaluation THIS SPECIMEN IS ACCEPTABLE FOR SPUTUM CULTURE  Final   Report Status 06/27/2016 FINAL  Final  Culture, respiratory (NON-Expectorated)     Status: None   Collection Time: 06/27/16  1:55 AM  Result Value Ref Range Status   Specimen Description SPUTUM  Final   Special Requests NONE Reflexed from [id number]  Final   Gram Stain   Final    FEW WBC PRESENT,BOTH PMN AND MONONUCLEAR FEW GRAM POSITIVE COCCI RARE GRAM NEGATIVE RODS    Culture   Final    MODERATE HAEMOPHILUS INFLUENZAE BETA LACTAMASE NEGATIVE    Report Status 06/28/2016 FINAL  Final  MRSA PCR Screening     Status: None   Collection Time: 06/27/16  9:01 AM  Result Value Ref Range Status   MRSA by PCR NEGATIVE NEGATIVE Final    Comment:        The GeneXpert MRSA Assay (FDA approved for NASAL specimens only), is one component of a comprehensive MRSA colonization surveillance program. It is not intended to diagnose MRSA infection nor to guide or monitor treatment for MRSA infections.     Today   Subjective    [name] today has no headache,no chest abdominal pain,no new weakness tingling or numbness, feels much better wants to go home today.     Objective   Blood pressure 155/90, pulse 64, temperature 98.2 F (36.8 C), temperature source Oral, resp. rate 17, height 5\' 10"  (1.778 m), weight (!) 136.8 kg (301 lb 8 oz), SpO2 93 %.   Intake/Output Summary (Last 24  hours) at 07/02/16 0846 Last data filed at 07/02/16 0727  Gross per 24 hour  Intake              555 ml  Output              900 ml  Net             -345 ml    Exam Awake Alert, Oriented x 3, No new F.N deficits, Normal affect [place].AT,PERRAL Supple Neck,No JVD, No cervical lymphadenopathy appriciated.  Symmetrical Chest wall movement, Good air movement bilaterally, CTAB RRR,No Gallops,Rubs or new Murmurs, No Parasternal Heave +ve B.Sounds, Abd Soft, Non tender, No organomegaly appriciated, No rebound -guarding or rigidity. No  Cyanosis, Clubbing or edema, No new Rash or bruise   Data Review   CBC w Diff:  Lab Results  Component Value Date   WBC 9.7 07/01/2016   HGB 10.5 (L) 07/01/2016   HCT 32.9 (L) 07/01/2016   PLT 249 07/01/2016   LYMPHOPCT 14 12/06/2014   MONOPCT 7 12/06/2014   EOSPCT 2 12/06/2014   BASOPCT 0 12/06/2014    CMP:  Lab Results  Component Value Date   NA 139 07/02/2016   K 4.2 07/02/2016   CL 110 07/02/2016   CO2 21 (L) 07/02/2016   BUN 67 (H) 07/02/2016   CREATININE 2.88 (H) 07/02/2016   PROT 8.4 (H) 06/27/2016   ALBUMIN 3.1 (L) 06/27/2016   BILITOT 0.8 06/27/2016   ALKPHOS 87 06/27/2016   AST 15 06/27/2016   ALT 8 (L) 06/27/2016  .   Total Time in preparing paper work, data evaluation and todays exam - 15 minutes  Lala Lund M.D on 07/02/2016 at 8:46 AM  [facility]   Office  [phone]

## 2016-07-02 NOTE — Progress Notes (Signed)
S: No new CO.  Wants to go home O:BP (!) 161/90 (BP Location: Left Arm)   Pulse 64   Temp 98.2 F (36.8 C) (Oral)   Resp 17   Ht 5\' 10"  (1.778 m)   Wt (!) 136.8 kg (301 lb 8 oz) Comment: c scale  SpO2 93%   BMI 43.26 kg/m   Intake/Output Summary (Last 24 hours) at 07/02/16 0717 Last data filed at 07/02/16 0700  Gross per 24 hour  Intake              720 ml  Output                0 ml  Net              720 ml   Weight change: 0.635 kg (1 lb 6.4 oz) SFK:CLEXN and alert CVS:RRR Resp: Scattered rhonchi Abd:+ BS NTND Ext: tr-1+ edema NEURO:CNI, Ox3 no asterixis   . allopurinol  100 mg Oral Daily  . amLODipine  10 mg Oral Daily  . apixaban  5 mg Oral BID  . aspirin EC  81 mg Oral Daily  . carvedilol  12.5 mg Oral BID WC  . cloNIDine  0.3 mg Oral TID  . colchicine  0.6 mg Oral Daily  . folic acid  1 mg Oral Daily  . hydrALAZINE  100 mg Oral TID  . minoxidil  2.5 mg Oral Daily  . pantoprazole  40 mg Oral Daily   US Renal  Result Date: 06/30/2016 CLINICAL DATA:  Acute renal failure. EXAM: RENAL / URINARY TRACT ULTRASOUND COMPLETE COMPARISON:  Ultrasound of December 14, 2011. FINDINGS: Right Kidney: Length: 11.6 cm. Increased echogenicity of renal parenchyma is noted with cortical thinning. No mass or hydronephrosis visualized. Left Kidney: Length: 10.4 cm. Increased echogenicity of renal parenchyma is noted with cortical thinning. 1.1 cm simple cyst is noted. No mass or hydronephrosis visualized. Bladder: Appears normal for degree of bladder distention. IMPRESSION: Increased echogenicity of renal parenchyma is noted bilaterally suggesting medical renal disease. No hydronephrosis or renal obstruction is noted. Electronically Signed   By: Marijo Conception, M.D.   On: 06/30/2016 09:57   BMET    Component Value Date/Time   NA 139 07/02/2016 0330   K 4.2 07/02/2016 0330   CL 110 07/02/2016 0330   CO2 21 (L) 07/02/2016 0330   GLUCOSE 107 (H) 07/02/2016 0330   BUN 67 (H) 07/02/2016  0330   CREATININE 2.88 (H) 07/02/2016 0330   CALCIUM 7.6 (L) 07/02/2016 0330   GFRNONAA 19 (L) 07/02/2016 0330   GFRAA 22 (L) 07/02/2016 0330   CBC    Component Value Date/Time   WBC 9.7 07/01/2016 0344   RBC 3.61 (L) 07/01/2016 0344   HGB 10.5 (L) 07/01/2016 0344   HCT 32.9 (L) 07/01/2016 0344   PLT 249 07/01/2016 0344   MCV 91.1 07/01/2016 0344   MCH 29.1 07/01/2016 0344   MCHC 31.9 07/01/2016 0344   RDW 14.7 07/01/2016 0344   LYMPHSABS 1.1 12/06/2014 2218   MONOABS 0.6 12/06/2014 2218   EOSABS 0.1 12/06/2014 2218   BASOSABS 0.0 12/06/2014 2218     Assessment:  1. Acute on CKD 3 most likely due to hemodynamic changes from BP control and diuresis.  No UO recorded yest?? But Scr trending down 2. HTN 3. Pulm HTN  Plan: 1.  She can go from renal standpoint.  Will arrange repeat labs later this week at [facility] in [place].  She says she  was seen at [facility] in the past and will arrange for FU.  She should resume her oral torsemide tomorrow.  Leave off aldactone for now.   [name]

## 2016-07-16 ENCOUNTER — Inpatient Hospital Stay (HOSPITAL_COMMUNITY): Admit: 2016-07-16 | Payer: Self-pay

## 2016-07-25 ENCOUNTER — Telehealth: Payer: Self-pay | Admitting: Licensed Clinical Social Worker

## 2016-07-25 NOTE — Telephone Encounter (Signed)
CSW left message for patient to return call regarding support due to no show appointments. [name], [place], [place]

## 2016-11-07 ENCOUNTER — Ambulatory Visit (HOSPITAL_COMMUNITY)
Admission: RE | Admit: 2016-11-07 | Discharge: 2016-11-07 | Disposition: A | Discharge status: Home / Self Care | Payer: Self-pay | Source: Ambulatory Visit | Attending: Cardiology | Admitting: Cardiology

## 2016-11-07 ENCOUNTER — Encounter (HOSPITAL_COMMUNITY): Payer: Self-pay

## 2016-11-07 VITALS — BP 188/120 | HR 89 | Wt 315.0 lb

## 2016-11-07 DIAGNOSIS — N183 Chronic kidney disease, stage 3 unspecified: Secondary | ICD-10-CM

## 2016-11-07 DIAGNOSIS — Z6841 Body Mass Index (BMI) 40.0 and over, adult: Secondary | ICD-10-CM | POA: Insufficient documentation

## 2016-11-07 DIAGNOSIS — Z79899 Other long term (current) drug therapy: Secondary | ICD-10-CM | POA: Insufficient documentation

## 2016-11-07 DIAGNOSIS — I160 Hypertensive urgency: Secondary | ICD-10-CM

## 2016-11-07 DIAGNOSIS — I5033 Acute on chronic diastolic (congestive) heart failure: Secondary | ICD-10-CM | POA: Insufficient documentation

## 2016-11-07 DIAGNOSIS — I130 Hypertensive heart and chronic kidney disease with heart failure and stage 1 through stage 4 chronic kidney disease, or unspecified chronic kidney disease: Secondary | ICD-10-CM | POA: Insufficient documentation

## 2016-11-07 DIAGNOSIS — Z7901 Long term (current) use of anticoagulants: Secondary | ICD-10-CM | POA: Insufficient documentation

## 2016-11-07 DIAGNOSIS — I5032 Chronic diastolic (congestive) heart failure: Secondary | ICD-10-CM

## 2016-11-07 LAB — CBC
HCT: 29.4 % — ABNORMAL LOW (ref 36.0–46.0)
Hemoglobin: 8.9 g/dL — ABNORMAL LOW (ref 12.0–15.0)
MCH: 29.3 pg (ref 26.0–34.0)
MCHC: 30.3 g/dL (ref 30.0–36.0)
MCV: 96.7 fL (ref 78.0–100.0)
PLATELETS: 202 10*3/uL (ref 150–400)
RBC: 3.04 MIL/uL — ABNORMAL LOW (ref 3.87–5.11)
RDW: 15.8 % — AB (ref 11.5–15.5)
WBC: 6.2 10*3/uL (ref 4.0–10.5)

## 2016-11-07 LAB — BRAIN NATRIURETIC PEPTIDE: B NATRIURETIC PEPTIDE 5: 779 pg/mL — AB (ref 0.0–100.0)

## 2016-11-07 LAB — COMPREHENSIVE METABOLIC PANEL
ALT: 8 U/L — ABNORMAL LOW (ref 14–54)
ANION GAP: 8 (ref 5–15)
AST: 16 U/L (ref 15–41)
Albumin: 3.1 g/dL — ABNORMAL LOW (ref 3.5–5.0)
Alkaline Phosphatase: 93 U/L (ref 38–126)
BUN: 31 mg/dL — ABNORMAL HIGH (ref 6–20)
CALCIUM: 7.8 mg/dL — AB (ref 8.9–10.3)
CHLORIDE: 106 mmol/L (ref 101–111)
CO2: 27 mmol/L (ref 22–32)
Creatinine, Ser: 3.17 mg/dL — ABNORMAL HIGH (ref 0.44–1.00)
GFR calc non Af Amer: 17 mL/min — ABNORMAL LOW (ref >60)
GFR, EST AFRICAN AMERICAN: 19 mL/min — AB (ref >60)
Glucose, Bld: 93 mg/dL (ref 65–99)
Potassium: 3.5 mmol/L (ref 3.5–5.1)
SODIUM: 141 mmol/L (ref 135–145)
Total Bilirubin: 0.8 mg/dL (ref 0.3–1.2)
Total Protein: 7.5 g/dL (ref 6.5–8.1)

## 2016-11-07 MED ORDER — HYDRALAZINE HCL 50 MG PO TABS
100.0000 mg | ORAL_TABLET | Freq: Once | ORAL | Status: AC
  Administered 2016-11-07: 100 mg via ORAL
  Filled 2016-11-07: qty 2

## 2016-11-07 MED ORDER — CLONIDINE HCL 0.2 MG PO TABS
0.4000 mg | ORAL_TABLET | Freq: Once | ORAL | Status: AC
  Administered 2016-11-07: 0.4 mg via ORAL
  Filled 2016-11-07: qty 2

## 2016-11-07 MED ORDER — TORSEMIDE 20 MG PO TABS: 40.0000 mg | ORAL_TABLET | Freq: Every day | ORAL | 3 refills | Status: DC

## 2016-11-07 NOTE — Patient Instructions (Addendum)
Torsemide 40 mg (2 tab), twice a day for 3 days 9/12-14  Then take torsemide 40 mg (2 tab) daily  Labs today  Your physician recommends that you schedule a follow-up appointment in: 2 weeks

## 2016-11-07 NOTE — Progress Notes (Signed)
Patient ID: [name], female   DOB: Oct 14, 1970, 46 y.o.   MRN: [id number]     Advanced Heart Failure Clinic Note   PCP: [name] HF: [name]   HPI: Ms [name] is a 46 yo AAF with history of HTN, gout, morbid obesity, chronic atrial fibrillation -on eliquis (had DC-CV 96/75/91), and diastolic heart failure.   63/8/46 Echo: diastolic heart failure with ejection fraction of 55-60%, severe LVH, Mildly dilated RV. Mildly to mod dilated RA. moderate pericardial effusion and echocardiographic changes consistent with a possible infiltrative disease. PAPP 72 mmHg   Milrinone stopped 10/30 after RHC c/w high output HF.  RA = 16  RV = 81/14/20  PA = 78/32 (52)  PCW = 21  Fick cardiac output/index = 11.9/4.6  Thermo CO/CI = 9.8/3.8  PVR = 2.6 Woods (Fick)  FA sat = 91%  PA sat = 65%, 72%  SVC sat = 73%  RA sat = 66%  Admitted to [facility] on 12/10/11 for massive volume overload. Transferred to [facility] on 10/26 due to worsening renal function, weakness and volume overload. SPEP/UPEP/fat pad biopsy/bone marrow negative for amyloid. Echo findings thought to be combination of severe HTN and PAH related to obesity-hypoventilation syndrome. Bubble study +. Suspect opening of PFO in setting of PAH. Diuresed ~100 pounds while in house. Cr seems to be new baseline ~2.5. Discharged from [name] 01/04/12 to [name]. Discharged from [name]  01/25/12. D/C  weight was 229 pounds.   Admitted to [name] from [facility] with marked volume overload. Diuresed with lasix drip and transitioned to torsemide 40 mg daily. Discharge weight 310 pounds.    Today she returns for HF follow up. Overall feeling ok. SOB with ADLs. Denies PND/Orthopnea. She has not been weighing daily. She had been out medications for for 2 months but restarted taking on Sunday. Says she was unable to pay for her medications but now has Medicaid. She did not take medications this morning.  Increased leg edema. Requires assistance with ADLS.  Requires assistance with transportation.   Review of systems complete and found to be negative unless listed in HPI.    Past Medical History:  Diagnosis Date  . Anemia   . Atrial flutter ([name])   . Chronic diastolic heart failure ([place])    a. Amyloidosis work-up negative 2013  b. 12/07/2014 ECHO EF 55-60%. LA severely dilated. RV normal RA moderately dilated, Severe LVH.IVC dilated.   . CKD (chronic kidney disease) stage 3, GFR 30-59 ml/min   . Essential hypertension, benign   . Gout   . History of cardiac catheterization    a. ectatic coronaries without obstruction 2006 - [place]  . Hypertensive heart disease   . Morbid obesity ([place])   . Persistent atrial fibrillation ([name])   . Pulmonary hypertension ([name])     Current Outpatient Prescriptions  Medication Sig Dispense Refill  . allopurinol (ZYLOPRIM) 100 MG tablet Take 100 mg by mouth daily.     Marland Kitchen amLODipine (NORVASC) 10 MG tablet Take 1 tablet (10 mg total) by mouth daily. 30 tablet 0  . apixaban (ELIQUIS) 5 MG TABS tablet Take 5 mg by mouth 2 (two) times daily.    . cloNIDine (CATAPRES) 0.2 MG tablet Take 2 tablets (0.4 mg total) by mouth 3 (three) times daily. May take an additional tab prn for SBP>180 200 tablet 6  . colchicine 0.6 MG tablet Take 0.6 mg by mouth daily as needed (gouty flare).    . folic acid (FOLVITE)  1 MG tablet Take 1 tablet (1 mg total) by mouth daily. 30 tablet 0  . hydrALAZINE (APRESOLINE) 100 MG tablet Take 1 tablet (100 mg total) by mouth 3 (three) times daily. 90 tablet 3  . loratadine (CLARITIN) 10 MG tablet Take 10 mg by mouth daily.    . minoxidil (LONITEN) 2.5 MG tablet Take 1 tablet (2.5 mg total) by mouth daily. 30 tablet 0  . pantoprazole (PROTONIX) 40 MG tablet Take 1 tablet (40 mg total) by mouth daily. 30 tablet 0  . potassium chloride SA (K-DUR,KLOR-CON) 20 MEQ tablet Take 1 tablet (20 mEq total) by mouth daily. 30 tablet 3  . torsemide (DEMADEX) 20 MG tablet Take 2 tablets (40 mg total) by  mouth daily. Take an additional 20 mg (1 tablet) in the afternoon for swelling in the legs or shortness of breath    . acetaminophen (TYLENOL) 325 MG tablet Take 650 mg by mouth every 6 (six) hours as needed for mild pain or headache.     No current facility-administered medications for this encounter.     Vitals:   11/07/16 0900  BP: (!) 188/120  Pulse: 89  SpO2: 97%  Weight: (!) 315 lb (142.9 kg)     Wt Readings from Last 3 Encounters:  11/07/16 (!) 315 lb (142.9 kg)  07/02/16 (!) 301 lb 8 oz (136.8 kg)  05/17/16 (!) 304 lb 9.6 oz (138.2 kg)    PHYSICAL EXAM: General:  Well appearing. No resp difficulty. Walked slowly in the clinic.  HEENT: normal Neck: supple. JVP to jaw . Carotids 2+ bilat; no bruits. No lymphadenopathy or thryomegaly appreciated. Cor: PMI nondisplaced. Regular rate & rhythm. No rubs, gallops or murmurs. Lungs: clear Abdomen: soft, nontender, nondistended. No hepatosplenomegaly. No bruits or masses. Good bowel sounds. Extremities: no cyanosis, clubbing, rash, R and LLE 3+ edema Neuro: alert & orientedx3, cranial nerves grossly intact. moves all 4 extremities w/o difficulty. Affect pleasant  ASSESSMENT & PLAN:  1) Acute/Chronic diastolic HF- ECHO 10/6043 EF 55-60% NYHA III. Volume status elevated. Increase torsemide to 40 mg twice a day. Offered IV lasix however she declined.  - Check BMET and BNP We discussed at length: 1) Weigh yourself in the morning before breakfast. Write it down and keep it in a log. 2) Take your medicines as prescribed 3) Eat low Beverly foods-Limit Beverly (sodium) to 2000 mg per day.  4) Stay as active as you can everyday 5) Limit all fluids for the day to less than 2 liters .    2) Afib- chronic -EKG today with A fib 79 bpm. Rate controlled.  -Continue  eliquis twice a day.  - CBC today  3) HTN Urgency  - Elefvated in the setting of medication noncompliance.  - She was given 100 mg hydralazine and 0.4 mg clonidine in the clinic.  She will continue HTN meds as ordered.  - Continue hydralazine 100 mg TID - Continue Norvasc 10 mg daily.  - Continue clonidine 0.4 mg TID - Continue coreg 6.25 mg BID - Renal US 11/29/15 with no RA.  - 4) CKD Stage III-   - BMT today.   5) Obesity- Body mass index is 45.2 kg/m. Discussed portion control.   She is at high risk for readmit due to poor insight. Greater than 50% of the (total minutes 26) visit spent in counseling/coordination of care regarding medication compliance, volume overload, and the importance of blood pressure management.   Follow up in 4 weeks.   Amy  Clegg NP-C  10:37 AM

## 2016-11-21 ENCOUNTER — Emergency Department (HOSPITAL_COMMUNITY)
Admission: EM | Admit: 2016-11-21 | Discharge: 2016-11-21 | Disposition: A | Discharge status: Home / Self Care | Payer: Self-pay | Attending: Emergency Medicine | Admitting: Emergency Medicine

## 2016-11-21 ENCOUNTER — Encounter (HOSPITAL_COMMUNITY): Payer: Self-pay

## 2016-11-21 ENCOUNTER — Encounter (HOSPITAL_COMMUNITY): Payer: Medicaid Other

## 2016-11-21 ENCOUNTER — Emergency Department (HOSPITAL_COMMUNITY): Payer: Self-pay

## 2016-11-21 DIAGNOSIS — Z7901 Long term (current) use of anticoagulants: Secondary | ICD-10-CM | POA: Insufficient documentation

## 2016-11-21 DIAGNOSIS — N183 Chronic kidney disease, stage 3 (moderate): Secondary | ICD-10-CM | POA: Insufficient documentation

## 2016-11-21 DIAGNOSIS — I481 Persistent atrial fibrillation: Secondary | ICD-10-CM | POA: Insufficient documentation

## 2016-11-21 DIAGNOSIS — I272 Pulmonary hypertension, unspecified: Secondary | ICD-10-CM | POA: Insufficient documentation

## 2016-11-21 DIAGNOSIS — Z79899 Other long term (current) drug therapy: Secondary | ICD-10-CM | POA: Insufficient documentation

## 2016-11-21 DIAGNOSIS — I5033 Acute on chronic diastolic (congestive) heart failure: Secondary | ICD-10-CM | POA: Insufficient documentation

## 2016-11-21 DIAGNOSIS — I130 Hypertensive heart and chronic kidney disease with heart failure and stage 1 through stage 4 chronic kidney disease, or unspecified chronic kidney disease: Secondary | ICD-10-CM | POA: Insufficient documentation

## 2016-11-21 DIAGNOSIS — I10 Essential (primary) hypertension: Secondary | ICD-10-CM

## 2016-11-21 DIAGNOSIS — I4892 Unspecified atrial flutter: Secondary | ICD-10-CM | POA: Insufficient documentation

## 2016-11-21 HISTORY — DX: Cellulitis, unspecified: L03.90

## 2016-11-21 LAB — COMPREHENSIVE METABOLIC PANEL
ALBUMIN: 3.2 g/dL — AB (ref 3.5–5.0)
ALT: 9 U/L — AB (ref 14–54)
AST: 16 U/L (ref 15–41)
Alkaline Phosphatase: 82 U/L (ref 38–126)
Anion gap: 7 (ref 5–15)
BUN: 32 mg/dL — ABNORMAL HIGH (ref 6–20)
CALCIUM: 8.4 mg/dL — AB (ref 8.9–10.3)
CO2: 23 mmol/L (ref 22–32)
Chloride: 109 mmol/L (ref 101–111)
Creatinine, Ser: 2.66 mg/dL — ABNORMAL HIGH (ref 0.44–1.00)
GFR calc non Af Amer: 21 mL/min — ABNORMAL LOW (ref >60)
GFR, EST AFRICAN AMERICAN: 24 mL/min — AB (ref >60)
Glucose, Bld: 99 mg/dL (ref 65–99)
Potassium: 3.6 mmol/L (ref 3.5–5.1)
Sodium: 139 mmol/L (ref 135–145)
Total Bilirubin: 1.4 mg/dL — ABNORMAL HIGH (ref 0.3–1.2)
Total Protein: 8.3 g/dL — ABNORMAL HIGH (ref 6.5–8.1)

## 2016-11-21 LAB — CBC WITH DIFFERENTIAL/PLATELET
BASOS ABS: 0 10*3/uL (ref 0.0–0.1)
BASOS PCT: 1 %
Eosinophils Absolute: 0.2 10*3/uL (ref 0.0–0.7)
Eosinophils Relative: 4 %
HEMATOCRIT: 27.3 % — AB (ref 36.0–46.0)
Hemoglobin: 8.5 g/dL — ABNORMAL LOW (ref 12.0–15.0)
Lymphocytes Relative: 9 %
Lymphs Abs: 0.5 10*3/uL — ABNORMAL LOW (ref 0.7–4.0)
MCH: 29.8 pg (ref 26.0–34.0)
MCHC: 31.1 g/dL (ref 30.0–36.0)
MCV: 95.8 fL (ref 78.0–100.0)
MONO ABS: 0.4 10*3/uL (ref 0.1–1.0)
Monocytes Relative: 8 %
NEUTROS PCT: 78 %
Neutro Abs: 4 10*3/uL (ref 1.7–7.7)
Platelets: 184 10*3/uL (ref 150–400)
RBC: 2.85 MIL/uL — AB (ref 3.87–5.11)
RDW: 16.2 % — AB (ref 11.5–15.5)
WBC: 5.2 10*3/uL (ref 4.0–10.5)

## 2016-11-21 LAB — I-STAT BETA HCG BLOOD, ED (MC, WL, AP ONLY)

## 2016-11-21 LAB — BRAIN NATRIURETIC PEPTIDE: B Natriuretic Peptide: 959.3 pg/mL — ABNORMAL HIGH (ref 0.0–100.0)

## 2016-11-21 LAB — I-STAT TROPONIN, ED: Troponin i, poc: 0.05 ng/mL (ref 0.00–0.08)

## 2016-11-21 MED ORDER — CLONIDINE HCL 0.2 MG PO TABS
0.2000 mg | ORAL_TABLET | Freq: Once | ORAL | Status: AC
Start: 2016-11-21 — End: 2016-11-21
  Administered 2016-11-21: 0.2 mg via ORAL
  Filled 2016-11-21: qty 1

## 2016-11-21 MED ORDER — FUROSEMIDE 10 MG/ML IJ SOLN
40.0000 mg | Freq: Once | INTRAMUSCULAR | Status: AC
  Administered 2016-11-21: 40 mg via INTRAVENOUS
  Filled 2016-11-21: qty 4

## 2016-11-21 MED ORDER — CLONIDINE HCL 0.2 MG PO TABS: 0.4000 mg | ORAL_TABLET | Freq: Three times a day (TID) | ORAL | 6 refills | Status: DC

## 2016-11-21 MED ORDER — HYDRALAZINE HCL 20 MG/ML IJ SOLN
10.0000 mg | Freq: Once | INTRAMUSCULAR | Status: AC
  Administered 2016-11-21: 10 mg via INTRAVENOUS
  Filled 2016-11-21: qty 1

## 2016-11-21 MED ORDER — HYDRALAZINE HCL 100 MG PO TABS: 100.0000 mg | ORAL_TABLET | Freq: Three times a day (TID) | ORAL | 3 refills | Status: DC

## 2016-11-21 MED ORDER — CARVEDILOL 12.5 MG PO TABS
6.2500 mg | ORAL_TABLET | Freq: Once | ORAL | Status: AC
  Administered 2016-11-21: 6.25 mg via ORAL
  Filled 2016-11-21: qty 1

## 2016-11-21 MED ORDER — AMLODIPINE BESYLATE 10 MG PO TABS: 10.0000 mg | ORAL_TABLET | Freq: Every day | ORAL | 0 refills | Status: DC

## 2016-11-21 MED ORDER — AMLODIPINE BESYLATE 5 MG PO TABS
10.0000 mg | ORAL_TABLET | Freq: Once | ORAL | Status: AC
  Administered 2016-11-21: 10 mg via ORAL
  Filled 2016-11-21: qty 2

## 2016-11-21 NOTE — ED Provider Notes (Signed)
[facility] DEPT Provider Note   CSN: [id number] Arrival date & time: 11/21/16  1741     History   Chief Complaint Chief Complaint  Patient presents with  . Shortness of Breath    HPI [name] is a 46 y.o. female history of a flutter on a liquids, chronic diastolic heart failure, CK D, who presenting with shortness of breath, leg swelling. Patient states that she has been having worsening bilateral leg swelling for the last 2 weeks. She saw cardiology 2 weeks ago and her torsemide was increased to 40 mg twice daily for 3 days and her labs at that time showed a creatinine of 3.3 and BNP 800 (slightly worse than previous). She states that her shortness of breath initially improved but today short of breath got worse. She also has some cough with yellow sputum as well but denies any fevers. EMS was called and she was thought to have some wheezing so as given albuterol, Solu-Medrol. Was admitted recently for hypertensive urgency, CHF exacerbation. Patient denies chest pain.   The history is provided by the patient.    Past Medical History:  Diagnosis Date  . Anemia   . Atrial flutter ([place])   . Cellulitis   . Chronic diastolic heart failure ([place])    a. Amyloidosis work-up negative 2013  b. 12/07/2014 ECHO EF 55-60%. LA severely dilated. RV normal RA moderately dilated, Severe LVH.IVC dilated.   . CKD (chronic kidney disease) stage 3, GFR 30-59 ml/min   . Essential hypertension, benign   . Gout   . Gout   . History of cardiac catheterization    a. ectatic coronaries without obstruction 2006 - [place]  . Hypertensive heart disease   . Morbid obesity ([place])   . Persistent atrial fibrillation ([place])   . Pulmonary hypertension [facility])     Patient Active Problem List   Diagnosis Date Noted  . Acute pulmonary edema (HCC)   . Acute on chronic congestive heart failure ([place])   . Hypertensive emergency 06/26/2016  . Hypertensive urgency 06/26/2016  . Acute diastolic CHF (congestive  heart failure) ([place])   . CKD (chronic kidney disease) stage 3, GFR 30-59 ml/min   . Chronic diastolic heart failure ([place]) 02/06/2012  . HTN (hypertension) 02/06/2012  . Anemia 12/27/2011  . Morbid obesity with BMI of 50.0-59.9, adult ([place]) 12/24/2011  . Pulmonary hypertension ([place]) 12/15/2011  . Cellulitis of left leg 12/15/2011  . Anasarca 12/10/2011  . Accelerated hypertension 12/10/2011  . Elevated serum creatinine 12/10/2011  . Hyperbilirubinemia 12/10/2011  . Gout attack 12/10/2011  . Atrial fibrillation with controlled ventricular response ([place]) 12/10/2011  . Morbid obesity ([place]) 12/10/2011  . Acquired trigger finger 12/12/2010  . LATERAL EPICONDYLITIS 06/06/2009  . BURSITIS, OLECRANON 12/10/2007    Past Surgical History:  Procedure Laterality Date  . CARDIOVERSION  01/15/2012   Procedure: CARDIOVERSION;  Surgeon: Jolaine Artist, MD;  Location: [facility] ENDOSCOPY;  Service: Cardiovascular;  Laterality: N/A;  . RIGHT HEART CATHETERIZATION N/A 12/26/2011   Procedure: RIGHT HEART CATH;  Surgeon: Jolaine Artist, MD;  Location: [facility] CATH LAB;  Service: Cardiovascular;  Laterality: N/A;  . SKIN BIOPSY  12/18/2011   Procedure: BIOPSY SKIN;  Surgeon: Donato Heinz, MD;  Location: [facility] ORS;  Service: General;  Laterality: N/A;  in the minor room.  . TEE WITHOUT CARDIOVERSION  01/15/2012   Procedure: TRANSESOPHAGEAL ECHOCARDIOGRAM (TEE);  Surgeon: Jolaine Artist, MD;  Location[facility] ENDOSCOPY;  Service: Cardiovascular;  Laterality: N/A;  cardioversion/on xarelto  OB History    Gravida Para Term Preterm AB Living             0   SAB TAB Ectopic Multiple Live Births                   Home Medications    Prior to Admission medications   Medication Sig Start Date End Date Taking? Authorizing Provider  acetaminophen (TYLENOL) 325 MG tablet Take 650 mg by mouth every 6 (six) hours as needed for mild pain or headache.   Yes [provider]  allopurinol (ZYLOPRIM) 100  MG tablet Take 100 mg by mouth daily.  01/04/12  Yes Theodis Blaze, MD  apixaban (ELIQUIS) 5 MG TABS tablet Take 5 mg by mouth 2 (two) times daily.   Yes [provider]  colchicine 0.6 MG tablet Take 0.6 mg by mouth daily as needed (gouty flare).   Yes [provider]  folic acid (FOLVITE) 1 MG tablet Take 1 tablet (1 mg total) by mouth daily. 12/20/11  Yes Gosrani, Nimish C, MD  loratadine (CLARITIN) 10 MG tablet Take 10 mg by mouth daily.   Yes [provider]  minoxidil (LONITEN) 2.5 MG tablet Take 1 tablet (2.5 mg total) by mouth daily. 07/02/16  Yes Thurnell Lose, MD  pantoprazole (PROTONIX) 40 MG tablet Take 1 tablet (40 mg total) by mouth daily. 12/20/11  Yes Gosrani, Nimish C, MD  potassium chloride SA (K-DUR,KLOR-CON) 20 MEQ tablet Take 1 tablet (20 mEq total) by mouth daily. 07/03/16  Yes Thurnell Lose, MD  torsemide (DEMADEX) 20 MG tablet Take 2 tablets (40 mg total) by mouth daily. 11/07/16  Yes Larey Dresser, MD  amLODipine (NORVASC) 10 MG tablet Take 1 tablet (10 mg total) by mouth daily. 11/21/16   Drenda Freeze, MD  cloNIDine (CATAPRES) 0.2 MG tablet Take 2 tablets (0.4 mg total) by mouth 3 (three) times daily. May take an additional tab prn for SBP>180 11/21/16   Drenda Freeze, MD  hydrALAZINE (APRESOLINE) 100 MG tablet Take 1 tablet (100 mg total) by mouth 3 (three) times daily. 11/21/16   Drenda Freeze, MD    Family History Family History  Problem Relation Age of Onset  . Hypertension Mother   . Diabetes Mother     Social History Social History  Substance Use Topics  . Smoking status: Never Smoker  . Smokeless tobacco: Never Used  . Alcohol use No     Allergies   Penicillins   Review of Systems Review of Systems  Respiratory: Positive for shortness of breath.   All other systems reviewed and are negative.    Physical Exam Updated Vital Signs BP (!) 166/105   Pulse 87   Temp 98.5 F (36.9 C) (Oral)   Resp  20   Ht 5\' 11"  (1.803 m)   Wt (!) 142 kg (313 lb)   LMP 10/31/2016   SpO2 98%   BMI 43.65 kg/m   Physical Exam  Constitutional: She is oriented to person, place, and time.  Chronically ill   HENT:  Head: Normocephalic.  Mouth/Throat: Oropharynx is clear and moist.  Eyes: Pupils are equal, round, and reactive to light. Conjunctivae and EOM are normal.  Neck: Normal range of motion. Neck supple.  Cardiovascular: Normal rate, regular rhythm and normal heart sounds.   Pulmonary/Chest:  + crackles bilateral bases   Abdominal: Soft. Bowel sounds are normal. She exhibits no distension. There is no tenderness. There  is no guarding.  Musculoskeletal:  2+ edema bilateral legs   Neurological: She is alert and oriented to person, place, and time. No cranial nerve deficit. Coordination normal.  Skin: Skin is warm.  Psychiatric: She has a normal mood and affect.  Nursing note and vitals reviewed.    ED Treatments / Results  Labs (all labs ordered are listed, but only abnormal results are displayed) Labs Reviewed  CBC WITH DIFFERENTIAL/PLATELET - Abnormal; Notable for the following:       Result Value   RBC 2.85 (*)    Hemoglobin 8.5 (*)    HCT 27.3 (*)    RDW 16.2 (*)    Lymphs Abs 0.5 (*)    All other components within normal limits  COMPREHENSIVE METABOLIC PANEL - Abnormal; Notable for the following:    BUN 32 (*)    Creatinine, Ser 2.66 (*)    Calcium 8.4 (*)    Total Protein 8.3 (*)    Albumin 3.2 (*)    ALT 9 (*)    Total Bilirubin 1.4 (*)    GFR calc non Af Amer 21 (*)    GFR calc Af Amer 24 (*)    All other components within normal limits  BRAIN NATRIURETIC PEPTIDE - Abnormal; Notable for the following:    B Natriuretic Peptide 959.3 (*)    All other components within normal limits  I-STAT BETA HCG BLOOD, ED ([facility], [facility], [facility] ONLY)  I-STAT TROPONIN, ED    EKG  EKG Interpretation None       Radiology Dg Chest 2 View  Result Date: 11/21/2016 CLINICAL DATA:  Pt  arrives EMS from home with c/o shob x 2 weeks. Pt ambulates to truck at residence. Pt has hx of CHF and afib. Pt given albuterol neb in route which lead to productive cough with yellow phlegm. EXAM: CHEST  2 VIEW COMPARISON:  06/29/2016 FINDINGS: Moderate enlargement of the cardiopericardial silhouette. No mediastinal or hilar masses. No convincing adenopathy. Prominent vascular markings are noted bilaterally similar to the prior exam. Mild thickening of the fissures also stable. No lung consolidation. No pleural effusion or pneumothorax. Skeletal structures are intact. IMPRESSION: 1. No acute cardiopulmonary disease. 2. Stable cardiomegaly. Chronically prominent vascular markings in lungs similar to multiple prior studies with no convincing acute edema. Electronically Signed   By: Lajean Manes M.D.   On: 11/21/2016 19:09    Procedures Procedures (including critical care time)  Medications Ordered in ED Medications  hydrALAZINE (APRESOLINE) injection 10 mg (10 mg Intravenous Given 11/21/16 1822)  amLODipine (NORVASC) tablet 10 mg (10 mg Oral Given 11/21/16 1822)  cloNIDine (CATAPRES) tablet 0.2 mg (0.2 mg Oral Given 11/21/16 1822)  carvedilol (COREG) tablet 6.25 mg (6.25 mg Oral Given 11/21/16 1928)  furosemide (LASIX) injection 40 mg (40 mg Intravenous Given 11/21/16 1954)     Initial Impression / Assessment and Plan / ED Course  I have reviewed the triage vital signs and the nursing notes.  Pertinent labs & imaging results that were available during my care of the patient were reviewed by me and considered in my medical decision making (see chart for details).     [name] is a 46 y.o. female here with SOB. Hx of diastolic CHF and appears in CHF exacerbation. Her weight was increased since recent admission. She is also hypertensive but has hx of hypertension on multiple meds. Will get labs, BNP, CXR. Will give BP meds and likely needs diuresis.    10:07 PM  BP down to 160s from 200  after given BP meds. Cr 2.6, stable. BNp increased to 950. I called case management since she has issues with her medicaid and she was given a voucher for her meds. I called Dr. Elson Areas from cardiology, who agrees with increase demedex to 40 mg BID. She has appointment at [place] [facility] in 2 days.   Final Clinical Impressions(s) / ED Diagnoses   Final diagnoses:  Pulmonary hypertension [facility])    New Prescriptions Current Discharge Medication List       Drenda Freeze, MD 11/21/16 2207

## 2016-11-21 NOTE — ED Triage Notes (Signed)
Pt arrives EMS from home with c/o shob x 2 weeks. Pt ambulates to truck at residence. Pt has hx of CHF and afib. Pt given albuterol neb in route which lead to productive cough with yellow phlegm. Given Solumedroil 125 mg iv. Afib  Noted in route.with rate 97bpm.

## 2016-11-21 NOTE — Care Management (Signed)
ED CM consulted concerning medication assistance. Patient is uninsured was receiving [employer]. Patient is being followed by heart Failure Clinic. Patient states she has run out of her medication . CM discussed MATCH program and guidelines patient is agreeable to terms. Enrolled patient letter printed and given to patient  with instructions. CM will sent message to HF Clinic Navigator to follow up with patient.

## 2016-11-21 NOTE — ED Notes (Signed)
Patients O2 sat was 99% while ambulating. Patient had no complaints while ambulating.

## 2016-11-21 NOTE — Discharge Instructions (Signed)
Your heart failure got worse.   Increase demedex to 40 mg twice daily until you see your heart doctor in 2 days   Continue taking your blood pressure medicines.   See your heart failure clinic in 2 days   Return to ER if you have worse shortness of breath, leg swelling, trouble breathing, chest pain

## 2016-11-22 ENCOUNTER — Telehealth (HOSPITAL_COMMUNITY): Payer: Self-pay

## 2016-11-22 ENCOUNTER — Encounter (HOSPITAL_COMMUNITY): Payer: Self-pay | Admitting: *Deleted

## 2016-11-22 NOTE — Telephone Encounter (Signed)
CHF Clinic appointment reminder call placed to patient for upcoming appointment tomorrow at 9:30 am.  Does understand purpose of this appointment and where CHF Clinic is located? Yes  How is patient feeling? SOB and fatigued, was seen in the Emergency Room last night for [facility] CHF. Reports issues obtaining meds, was put on [facility] program with CM in ED. Will make providers aware and forward to CHF SW Kennyth Lose and CHF pharmacist [place].  Does patient have all of their medications since their recent discharge? Yes, set up with [facility] program during ED visit last night  Patient also reminded to take all medications as prescribed on the day of his/her appointment and to bring all medications to this appointment.  Advised to call our office for tardiness or cancellations/rescheduling needs.  Leory Plowman, Guinevere Ferrari

## 2016-11-22 NOTE — Progress Notes (Signed)
Received medical records request from [facility] on 11/19/2016 CASE # [id number]   Requested records faxed today to [phone]  Original request will be scanned to patient's electronic medical record.

## 2016-11-23 ENCOUNTER — Encounter (HOSPITAL_COMMUNITY): Payer: Self-pay

## 2016-11-23 ENCOUNTER — Encounter (HOSPITAL_COMMUNITY): Payer: Self-pay | Admitting: Pharmacist

## 2016-11-23 ENCOUNTER — Ambulatory Visit (HOSPITAL_COMMUNITY)
Admission: RE | Admit: 2016-11-23 | Discharge: 2016-11-23 | Disposition: A | Discharge status: Home / Self Care | Payer: Self-pay | Source: Ambulatory Visit | Attending: Cardiology | Admitting: Cardiology

## 2016-11-23 ENCOUNTER — Encounter (HOSPITAL_COMMUNITY): Payer: Medicaid Other

## 2016-11-23 VITALS — BP 202/120 | HR 85 | Wt 330.0 lb

## 2016-11-23 DIAGNOSIS — I130 Hypertensive heart and chronic kidney disease with heart failure and stage 1 through stage 4 chronic kidney disease, or unspecified chronic kidney disease: Secondary | ICD-10-CM | POA: Insufficient documentation

## 2016-11-23 DIAGNOSIS — I4892 Unspecified atrial flutter: Secondary | ICD-10-CM | POA: Insufficient documentation

## 2016-11-23 DIAGNOSIS — Z7902 Long term (current) use of antithrombotics/antiplatelets: Secondary | ICD-10-CM | POA: Insufficient documentation

## 2016-11-23 DIAGNOSIS — I5032 Chronic diastolic (congestive) heart failure: Secondary | ICD-10-CM | POA: Insufficient documentation

## 2016-11-23 DIAGNOSIS — Z6841 Body Mass Index (BMI) 40.0 and over, adult: Secondary | ICD-10-CM | POA: Insufficient documentation

## 2016-11-23 DIAGNOSIS — Z9114 Patient's other noncompliance with medication regimen: Secondary | ICD-10-CM | POA: Insufficient documentation

## 2016-11-23 DIAGNOSIS — I481 Persistent atrial fibrillation: Secondary | ICD-10-CM | POA: Insufficient documentation

## 2016-11-23 DIAGNOSIS — I272 Pulmonary hypertension, unspecified: Secondary | ICD-10-CM | POA: Insufficient documentation

## 2016-11-23 DIAGNOSIS — N183 Chronic kidney disease, stage 3 (moderate): Secondary | ICD-10-CM | POA: Insufficient documentation

## 2016-11-23 DIAGNOSIS — I160 Hypertensive urgency: Secondary | ICD-10-CM

## 2016-11-23 DIAGNOSIS — M109 Gout, unspecified: Secondary | ICD-10-CM | POA: Insufficient documentation

## 2016-11-23 DIAGNOSIS — E8770 Fluid overload, unspecified: Secondary | ICD-10-CM | POA: Insufficient documentation

## 2016-11-23 DIAGNOSIS — I482 Chronic atrial fibrillation: Secondary | ICD-10-CM | POA: Insufficient documentation

## 2016-11-23 LAB — BASIC METABOLIC PANEL
Anion gap: 7 (ref 5–15)
BUN: 38 mg/dL — AB (ref 6–20)
CO2: 24 mmol/L (ref 22–32)
CREATININE: 2.74 mg/dL — AB (ref 0.44–1.00)
Calcium: 8.3 mg/dL — ABNORMAL LOW (ref 8.9–10.3)
Chloride: 110 mmol/L (ref 101–111)
GFR, EST AFRICAN AMERICAN: 23 mL/min — AB (ref >60)
GFR, EST NON AFRICAN AMERICAN: 20 mL/min — AB (ref >60)
Glucose, Bld: 93 mg/dL (ref 65–99)
POTASSIUM: 3.5 mmol/L (ref 3.5–5.1)
Sodium: 141 mmol/L (ref 135–145)

## 2016-11-23 MED ORDER — TORSEMIDE 20 MG PO TABS: 40.0000 mg | ORAL_TABLET | Freq: Two times a day (BID) | ORAL | 2 refills | Status: DC

## 2016-11-23 MED ORDER — AMLODIPINE BESYLATE 10 MG PO TABS: 10.0000 mg | ORAL_TABLET | Freq: Every day | ORAL | 2 refills | Status: DC

## 2016-11-23 MED ORDER — POTASSIUM CHLORIDE CRYS ER 20 MEQ PO TBCR: 20.0000 meq | EXTENDED_RELEASE_TABLET | Freq: Two times a day (BID) | ORAL | 2 refills | Status: DC

## 2016-11-23 MED ORDER — APIXABAN 5 MG PO TABS
5.0000 mg | ORAL_TABLET | Freq: Two times a day (BID) | ORAL | 3 refills | Status: DC
Start: 2016-11-23 — End: 2016-11-23

## 2016-11-23 MED ORDER — APIXABAN 5 MG PO TABS: 5.0000 mg | ORAL_TABLET | Freq: Two times a day (BID) | ORAL | 3 refills | Status: AC

## 2016-11-23 MED ORDER — CLONIDINE HCL 0.3 MG PO TABS: 0.3000 mg | ORAL_TABLET | Freq: Two times a day (BID) | ORAL | 2 refills | Status: DC

## 2016-11-23 MED FILL — TORSEMIDE 20 MG TABLET: 20 | 25 days supply | Qty: 100 | Fill #0

## 2016-11-23 MED FILL — AMLODIPINE BESYLATE 10 MG T: 10 | 34 days supply | Qty: 34 | Fill #0

## 2016-11-23 MED FILL — POTASSIUM CL ER 20 MEQ TABL: 20 | 34 days supply | Qty: 68 | Fill #0

## 2016-11-23 MED FILL — cloNIDine HCL 0.3 MG TABS: 0.3 | 30 days supply | Qty: 60 | Fill #0

## 2016-11-23 NOTE — Progress Notes (Signed)
Patient ID: [name], female   DOB: 01/04/1971, 46 y.o.   MRN: [id number]     Advanced Heart Failure Clinic Note   PCP: Dr Berdine Addison HF: Dr. Haroldine Laws   HPI: [name] is a 46 yo AAF with history of HTN, gout, morbid obesity, chronic atrial fibrillation -on eliquis (had DC-CV 47/09/62), and diastolic heart failure.   83/6/62 Echo: diastolic heart failure with ejection fraction of 55-60%, severe LVH, Mildly dilated RV. Mildly to mod dilated RA. moderate pericardial effusion and echocardiographic changes consistent with a possible infiltrative disease. PAPP 72 mmHg   Milrinone stopped 10/30 after RHC c/w high output HF.  RA = 16  RV = 81/14/20  PA = 78/32 (52)  PCW = 21  Fick cardiac output/index = 11.9/4.6  Thermo CO/CI = 9.8/3.8  PVR = 2.6 Woods (Fick)  FA sat = 91%  PA sat = 65%, 72%  SVC sat = 73%  RA sat = 66%  Admitted to [facility] on 12/10/11 for massive volume overload. Transferred to [facility] on 10/26 due to worsening renal function, weakness and volume overload. SPEP/UPEP/fat pad biopsy/bone marrow negative for amyloid. Echo findings thought to be combination of severe HTN and PAH related to obesity-hypoventilation syndrome. Bubble study +. Suspect opening of PFO in setting of PAH. Diuresed ~100 pounds while in house. Cr seems to be new baseline ~2.5. Discharged from [facility] 01/04/12 to [place]. Discharged from [facility]  01/25/12. D/C  weight was 229 pounds.   [name] returns today for HF follow up. [name] was seen in the clinic two weeks ago, BP was 188/120 and [name] was given po clonidine and hydralazine. [name] went to the ED 2 days ago for hypertensive urgency, SBP in the 200's had not taken any medications. Today, her BP is again high at 202/120. [name] says that [name] knows that it is high as [name] has not had any medications; [name] was given a voucher for a 30 day supply of medications but has not picked them up as [name] thinks [name] will have to pay a co pay. Her weight today is up about 15  pounds. [name] is SOB with walking into clinic. Denies chest pain, palpitations.   Review of systems complete and found to be negative unless listed in HPI.    Past Medical History:  Diagnosis Date  . Anemia   . Atrial flutter ([place])   . Cellulitis   . Chronic diastolic heart failure ([place])    a. Amyloidosis work-up negative 2013  b. 12/07/2014 ECHO EF 55-60%. LA severely dilated. RV normal RA moderately dilated, Severe LVH.IVC dilated.   . CKD (chronic kidney disease) stage 3, GFR 30-59 ml/min   . Essential hypertension, benign   . Gout   . Gout   . History of cardiac catheterization    a. ectatic coronaries without obstruction 2006 - [place]  . Hypertensive heart disease   . Morbid obesity ([place])   . Persistent atrial fibrillation ([place])   . Pulmonary hypertension ([place])     Current Outpatient Prescriptions  Medication Sig Dispense Refill  . acetaminophen (TYLENOL) 325 MG tablet Take 650 mg by mouth every 6 (six) hours as needed for mild pain or headache.    . allopurinol (ZYLOPRIM) 100 MG tablet Take 100 mg by mouth daily.     Marland Kitchen amLODipine (NORVASC) 10 MG tablet Take 1 tablet (10 mg total) by mouth daily. 30 tablet 0  . apixaban (ELIQUIS) 5 MG TABS tablet Take 5 mg by mouth 2 (  two) times daily.    . cloNIDine (CATAPRES) 0.2 MG tablet Take 2 tablets (0.4 mg total) by mouth 3 (three) times daily. May take an additional tab prn for SBP>180 20 tablet 6  . colchicine 0.6 MG tablet Take 0.6 mg by mouth daily as needed (gouty flare).    . folic acid (FOLVITE) 1 MG tablet Take 1 tablet (1 mg total) by mouth daily. 30 tablet 0  . hydrALAZINE (APRESOLINE) 100 MG tablet Take 1 tablet (100 mg total) by mouth 3 (three) times daily. 90 tablet 3  . loratadine (CLARITIN) 10 MG tablet Take 10 mg by mouth daily.    . minoxidil (LONITEN) 2.5 MG tablet Take 1 tablet (2.5 mg total) by mouth daily. 30 tablet 0  . pantoprazole (PROTONIX) 40 MG tablet Take 1 tablet (40 mg total) by mouth daily. 30 tablet 0  .  potassium chloride SA (K-DUR,KLOR-CON) 20 MEQ tablet Take 1 tablet (20 mEq total) by mouth daily. 30 tablet 3  . torsemide (DEMADEX) 20 MG tablet Take 2 tablets (40 mg total) by mouth daily. 70 tablet 3   No current facility-administered medications for this encounter.     Vitals:   11/23/16 0923  BP: (!) 202/120  Pulse: 85  SpO2: (!) 86%  Weight: (!) 330 lb (149.7 kg)     Wt Readings from Last 3 Encounters:  11/23/16 (!) 330 lb (149.7 kg)  11/21/16 (!) 313 lb (142 kg)  11/07/16 (!) 315 lb (142.9 kg)    PHYSICAL EXAM: General: Obese, fatigued appearing. HEENT: Normal Neck: Supple, thick. JVP to jaw. Carotids 2+ bilat; no bruits. No thyromegaly or nodule noted. Cor: PMI nondisplaced. Irregular, No M/G/R noted Lungs: Diminished in bilateral lower lobes. Clear in upper lobes.  Abdomen: Obese, soft, non-tender, non-distended, no HSM. No bruits or masses. +BS  Extremities: No cyanosis, clubbing, rash, 3+ edema to knees bilaterally.  Neuro: Alert & orientedx3, cranial nerves grossly intact. moves all 4 extremities w/o difficulty. Affect pleasant   ASSESSMENT & PLAN:  1) Chronic diastolic HF- ECHO 03/7251 EF 55-60%, Echo 06/2016 EF 55-60%.  - NYHA III - Volume elevated on exam, weight up 17 pounds from 2 weeks ago, but overall probably up many more pounds than that especially with discharge weight in May 2018 was 300 pounds. When [name] takes torsemide [name] has a good response. Will try to avoid hospitalization and provide her with her meds today. Start torsemide 40 mg BID. Also give KCl 63mEq daily.  - BMET today.  - Advised her to limit her fluid to less than 2L a day.   2) Afib- chronic - Rate controlled.  - Continue Eliquis.   3) HTN Urgency  - In the setting of medication noncompliance/limited access to meds. - Per Dr. Haroldine Laws, need to simplify medication regimen, especially since [name] has intermittent compliance with current regimen.  - Continue amlodipine 10 mg daily  -  Start clonidine 0.3 mg TID.  - Stop other anti-hypertensives and can add back when we see her next week.   4) CKD Stage III-   - BMET today.   5) Obesity - Body mass index is 46.03 kg/m. - Encouraged portion control and daily exercise.   Follow up next week. Tenuous and at high risk for admission. Meds provided to her today prior to discharge.    Arbutus Leas NP-C  9:43 AM  Patient seen and examined with Jettie Booze, NP. We discussed all aspects of the encounter. I agree with the  assessment and plan as stated above.   [name] is markedly volume overload and hypertensive in setting of running out of her medications due to loss of insurance. [name] says [name] still responds to torsemide but [name] just doesn't have her meds. Respiratory status stable with R >> L HF symptoms. We had long talk about possible need for admission versus management as outpatient. [name] prefers outpatient management. We were able to get her free medications from our HF fund at the outpatient pharmacy and we provided these to her prior to leaving clinic. We will see her next week to reassess. We have done our best to keep her regimen as simple as possible. [name] knows to come to ER this weekend if [name] is not improving.  Glori Bickers, MD  9:32 PM

## 2016-11-23 NOTE — Patient Instructions (Addendum)
Take ONLY the medications listed on your updated med list attached. Your medications have been provided to you free of charge under the [facility].  Eliquis 5 mg (1 tab) twice daily. You will be enrolled in the [facility] for financial assistance for this medication.  Clonidine 0.3 mg tablet twice daily.  Torsemide 40 mg (2 tabs) twice daily.  Potassium 20 meq (1 tab) twice daily.  Amlodipine (Norvasc) 10 mg tablet once daily.  You will be referred to [name] CHF social worker to see how we can better assist you with insurance, medications, finances, compliance, etc.  Routine lab work today. Will notify you of abnormal results, otherwise no news is good news!  Follow up next week.  Take all medication as prescribed the day of your appointment. Bring all medications with you to your appointment.  Do the following things EVERYDAY: 1) Weigh yourself in the morning before breakfast. Write it down and keep it in a log. 2) Take your medicines as prescribed 3) Eat low salt foods-Limit salt (sodium) to 2000 mg per day.  4) Stay as active as you can everyday 5) Limit all fluids for the day to less than 2 liters

## 2016-11-29 ENCOUNTER — Telehealth (HOSPITAL_COMMUNITY): Payer: Self-pay

## 2016-11-29 ENCOUNTER — Telehealth (HOSPITAL_COMMUNITY): Payer: Self-pay | Admitting: Pharmacist

## 2016-11-29 ENCOUNTER — Encounter (HOSPITAL_COMMUNITY): Payer: Medicaid Other

## 2016-11-29 NOTE — Telephone Encounter (Signed)
CHF Clinic appointment reminder call placed to patient for upcoming appointment.  Does understand purpose of this appointment and where CHF Clinic is located? Yes  How is patient feeling? Well, no complaints  Does patient have all of their medications? Yes  Patient also reminded to take all medications as prescribed on the day of his/her appointment and to bring all medications to this appointment.  Advised to call our office for tardiness or cancellations/rescheduling needs.  .Lalla Laham Genevea  

## 2016-11-29 NOTE — Telephone Encounter (Signed)
[employer] patient assistance approved for Eliquis 5 mg BID through 11/28/17.   Ruta Hinds. Velva Harman, PharmD, BCPS, CPP Clinical Pharmacist Pager: [phone] Phone: [phone] 11/29/2016 11:41 AM

## 2016-11-30 ENCOUNTER — Ambulatory Visit (HOSPITAL_COMMUNITY)
Admission: RE | Admit: 2016-11-30 | Discharge: 2016-11-30 | Disposition: A | Discharge status: Home / Self Care | Payer: Self-pay | Source: Ambulatory Visit | Attending: Internal Medicine | Admitting: Internal Medicine

## 2016-11-30 ENCOUNTER — Encounter (HOSPITAL_COMMUNITY): Payer: Self-pay

## 2016-11-30 VITALS — BP 190/98 | HR 85 | Wt 292.4 lb

## 2016-11-30 DIAGNOSIS — Z7901 Long term (current) use of anticoagulants: Secondary | ICD-10-CM | POA: Insufficient documentation

## 2016-11-30 DIAGNOSIS — I481 Persistent atrial fibrillation: Secondary | ICD-10-CM | POA: Insufficient documentation

## 2016-11-30 DIAGNOSIS — Z6841 Body Mass Index (BMI) 40.0 and over, adult: Secondary | ICD-10-CM | POA: Insufficient documentation

## 2016-11-30 DIAGNOSIS — I130 Hypertensive heart and chronic kidney disease with heart failure and stage 1 through stage 4 chronic kidney disease, or unspecified chronic kidney disease: Secondary | ICD-10-CM | POA: Insufficient documentation

## 2016-11-30 DIAGNOSIS — N183 Chronic kidney disease, stage 3 unspecified: Secondary | ICD-10-CM

## 2016-11-30 DIAGNOSIS — I4891 Unspecified atrial fibrillation: Secondary | ICD-10-CM

## 2016-11-30 DIAGNOSIS — Z79899 Other long term (current) drug therapy: Secondary | ICD-10-CM | POA: Insufficient documentation

## 2016-11-30 DIAGNOSIS — M109 Gout, unspecified: Secondary | ICD-10-CM | POA: Insufficient documentation

## 2016-11-30 DIAGNOSIS — I5032 Chronic diastolic (congestive) heart failure: Secondary | ICD-10-CM

## 2016-11-30 DIAGNOSIS — I159 Secondary hypertension, unspecified: Secondary | ICD-10-CM

## 2016-11-30 LAB — BASIC METABOLIC PANEL
Anion gap: 10 (ref 5–15)
BUN: 49 mg/dL — AB (ref 6–20)
CALCIUM: 8.3 mg/dL — AB (ref 8.9–10.3)
CO2: 27 mmol/L (ref 22–32)
CREATININE: 2.77 mg/dL — AB (ref 0.44–1.00)
Chloride: 103 mmol/L (ref 101–111)
GFR calc Af Amer: 23 mL/min — ABNORMAL LOW (ref >60)
GFR, EST NON AFRICAN AMERICAN: 20 mL/min — AB (ref >60)
GLUCOSE: 103 mg/dL — AB (ref 65–99)
Potassium: 3.7 mmol/L (ref 3.5–5.1)
SODIUM: 140 mmol/L (ref 135–145)

## 2016-11-30 MED ORDER — HYDRALAZINE HCL 25 MG PO TABS: 25.0000 mg | ORAL_TABLET | Freq: Three times a day (TID) | ORAL | 2 refills | Status: DC

## 2016-11-30 MED ORDER — DOXYCYCLINE HYCLATE 100 MG PO TABS: 100.0000 mg | ORAL_TABLET | Freq: Two times a day (BID) | ORAL | 0 refills | Status: DC

## 2016-11-30 MED FILL — hydrALAZINE HCL 25 MG TABS: 25 | 30 days supply | Qty: 90 | Fill #0

## 2016-11-30 NOTE — Patient Instructions (Signed)
START Hydralazine 25 mg tablet three times daily (once every 8 hours). Sent to [place] under the [facility].  START Doxycycline 100 mg tablet twice daily for 7 days (until bottle empty). Sent to [facility] in [place], use good Rx card.  Routine lab work today. Will notify you of abnormal results, otherwise no news is good news!  Follow up 1 week with CHF clinical pharmacist Ileene Patrick.  _________________________________________________________________ [name] Code: 8000  Follow up 1 month.  _________________________________________________________________ [name] Code: 8001  Take all medication as prescribed the day of your appointment. Bring all medications with you to your appointment.  Do the following things EVERYDAY: 1) Weigh yourself in the morning before breakfast. Write it down and keep it in a log. 2) Take your medicines as prescribed 3) Eat low salt foods-Limit salt (sodium) to 2000 mg per day.  4) Stay as active as you can everyday 5) Limit all fluids for the day to less than 2 liters

## 2016-11-30 NOTE — Progress Notes (Signed)
Patient ID: [name], female   DOB: [birth year], 46 y.o.   MRN: [id number]     Advanced Heart Failure Clinic Note   PCP: Dr Berdine Addison HF: Dr. Haroldine Laws   HPI: [name] is a 46 yo AAF with history of HTN, gout, morbid obesity, chronic atrial fibrillation -on eliquis (had DC-CV 25/36/64), and diastolic heart failure.   40/3/47 Echo: diastolic heart failure with ejection fraction of 55-60%, severe LVH, Mildly dilated RV. Mildly to mod dilated RA. moderate pericardial effusion and echocardiographic changes consistent with a possible infiltrative disease. PAPP 72 mmHg   Milrinone stopped 10/30 after RHC c/w high output HF.  RA = 16  RV = 81/14/20  PA = 78/32 (52)  PCW = 21  Fick cardiac output/index = 11.9/4.6  Thermo CO/CI = 9.8/3.8  PVR = 2.6 Woods (Fick)  FA sat = 91%  PA sat = 65%, 72%  SVC sat = 73%  RA sat = 66%  Admitted to [facility] on 12/10/11 for massive volume overload. Transferred to [facility] on 10/26 due to worsening renal function, weakness and volume overload. SPEP/UPEP/fat pad biopsy/bone marrow negative for amyloid. Echo findings thought to be combination of severe HTN and PAH related to obesity-hypoventilation syndrome. Bubble study +. Suspect opening of PFO in setting of PAH. Diuresed ~100 pounds while in house. Cr seems to be new baseline ~2.5. Discharged from [facility] 01/04/12 to [place]. Discharged from [facility]  01/25/12. D/C  weight was 229 pounds.   Returns today for HF follow up. Weight down about 30 pounds since starting torsemide (previously has not been able to get meds). Denies SOB, has more energy. Denies orthopnea, PND. Has persistent productive cough with yellow sputum. Denies fever and chills. Cough has been going on for a month, has tried OTC mucinex without relief.   Review of systems complete and found to be negative unless listed in HPI.    Past Medical History:  Diagnosis Date  . Anemia   . Atrial flutter ([place])   . Cellulitis   . Chronic  diastolic heart failure ([place])    a. Amyloidosis work-up negative 2013  b. 12/07/2014 ECHO EF 55-60%. LA severely dilated. RV normal RA moderately dilated, Severe LVH.IVC dilated.   . CKD (chronic kidney disease) stage 3, GFR 30-59 ml/min   . Essential hypertension, benign   . Gout   . Gout   . History of cardiac catheterization    a. ectatic coronaries without obstruction 2006 - [place]  . Hypertensive heart disease   . Morbid obesity ([place])   . Persistent atrial fibrillation ([place])   . Pulmonary hypertension ([place])     Current Outpatient Prescriptions  Medication Sig Dispense Refill  . amLODipine (NORVASC) 10 MG tablet Take 1 tablet (10 mg total) by mouth daily. 30 tablet 2  . apixaban (ELIQUIS) 5 MG TABS tablet Take 1 tablet (5 mg total) by mouth 2 (two) times daily. 180 tablet 3  . cloNIDine (CATAPRES) 0.3 MG tablet Take 1 tablet (0.3 mg total) by mouth 2 (two) times daily. May take an additional tab prn for SBP>180 60 tablet 2  . potassium chloride SA (K-DUR,KLOR-CON) 20 MEQ tablet Take 1 tablet (20 mEq total) by mouth 2 (two) times daily. 60 tablet 2  . torsemide (DEMADEX) 20 MG tablet Take 2 tablets (40 mg total) by mouth 2 (two) times daily. 120 tablet 2   No current facility-administered medications for this encounter.     Vitals:   11/30/16 1346  BP: Marland Kitchen)  190/98  Pulse: 85  SpO2: 99%  Weight: 292 lb 6 oz (132.6 kg)     Wt Readings from Last 3 Encounters:  11/30/16 292 lb 6 oz (132.6 kg)  11/23/16 (!) 330 lb (149.7 kg)  11/21/16 (!) 313 lb (142 kg)    PHYSICAL EXAM: General: Obese female, NAD.  No resp difficulty. HEENT: Normal Neck: Supple. JVP hard to assess, does not appear elevated. . Carotids 2+ bilat; no bruits. No thyromegaly or nodule noted. Cor: PMI nondisplaced. RRR, No M/G/R noted Lungs: CTAB, normal effort. Abdomen: Soft, non-tender, non-distended, no HSM. No bruits or masses. +BS  Extremities: No cyanosis, clubbing, rash. 1+ pedal edema bilaterally.    Neuro: Alert & orientedx3, cranial nerves grossly intact. moves all 4 extremities w/o difficulty. Affect pleasant    ASSESSMENT & PLAN:  1) Chronic diastolic HF- ECHO 16/0737 EF 55-60%, Echo 06/2016 EF 55-60%.  - NYHA II - Volume status much improved. Continue torsemide 40 mg BID.  - Continue KCl 11mEq daily.  - Reinforced fluid restriction and dietary restrictions.  - BMET today.   2) Afib- chronic - Rate controlled - Continue Eliquis.   3) HTN Urgency  - BP remains elevated, but improved.  - Continue amlodipine 10 mg daily - Continue clonidine 0.3 mg BID.  - Start hydralazine 25 mg TID.  - Return to clinic in one week with HF PharmD for further med titration.   4) CKD Stage III-   - BMET today.   5) Obesity - Body mass index is 40.78 kg/m. - Encouraged her to limit portion size.   Follow up in one month. Follow up with HF PharmD as above.     Arbutus Leas NP-C  2:16 PM

## 2016-12-05 ENCOUNTER — Ambulatory Visit (HOSPITAL_COMMUNITY): Payer: Self-pay

## 2016-12-10 ENCOUNTER — Ambulatory Visit (HOSPITAL_COMMUNITY): Payer: Self-pay

## 2016-12-17 ENCOUNTER — Ambulatory Visit (HOSPITAL_COMMUNITY): Payer: Self-pay

## 2016-12-20 ENCOUNTER — Other Ambulatory Visit (HOSPITAL_COMMUNITY): Payer: Self-pay | Admitting: *Deleted

## 2016-12-20 DIAGNOSIS — I272 Pulmonary hypertension, unspecified: Secondary | ICD-10-CM

## 2016-12-20 MED ORDER — AMLODIPINE BESYLATE 10 MG PO TABS: 10.0000 mg | ORAL_TABLET | Freq: Every day | ORAL | 2 refills | Status: DC

## 2016-12-20 MED ORDER — CLONIDINE HCL 0.3 MG PO TABS: 0.3000 mg | ORAL_TABLET | Freq: Two times a day (BID) | ORAL | 2 refills | Status: DC

## 2016-12-20 MED ORDER — POTASSIUM CHLORIDE CRYS ER 20 MEQ PO TBCR: 20.0000 meq | EXTENDED_RELEASE_TABLET | Freq: Two times a day (BID) | ORAL | 2 refills | Status: DC

## 2016-12-20 MED ORDER — TORSEMIDE 20 MG PO TABS: 40.0000 mg | ORAL_TABLET | Freq: Two times a day (BID) | ORAL | 2 refills | Status: DC

## 2016-12-20 MED FILL — cloNIDine HCL 0.3 MG TABS: 0.3 | 30 days supply | Qty: 60 | Fill #0

## 2016-12-20 MED FILL — AMLODIPINE BESYLATE 10 MG T: 10 | 30 days supply | Qty: 30 | Fill #0

## 2016-12-20 MED FILL — TORSEMIDE 20 MG TABLET: 20 | 30 days supply | Qty: 120 | Fill #0

## 2016-12-20 MED FILL — POTASSIUM CL ER 20 MEQ TABL: 20 | 30 days supply | Qty: 60 | Fill #0

## 2016-12-26 ENCOUNTER — Telehealth (HOSPITAL_COMMUNITY): Payer: Self-pay

## 2016-12-26 NOTE — Telephone Encounter (Signed)
CHF Clinic appointment reminder call placed to patient for upcoming post-hospital follow up.  Does understand purpose of this appointment and where CHF Clinic is located? Yes  How is patient feeling? Well, no complaints (per niece who answered call)  Does patient have all of their medications since their recent discharge? Yes  Patient also reminded to take all medications as prescribed on the day of his/her appointment and to bring all medications to this appointment.  Advised to call our office for tardiness or cancellations/rescheduling needs.  Leory Plowman, Guinevere Ferrari

## 2016-12-27 ENCOUNTER — Inpatient Hospital Stay (HOSPITAL_COMMUNITY): Admission: RE | Admit: 2016-12-27 | Payer: Self-pay | Source: Ambulatory Visit

## 2017-01-03 ENCOUNTER — Encounter (HOSPITAL_COMMUNITY): Payer: Self-pay

## 2017-01-03 ENCOUNTER — Telehealth: Payer: Self-pay | Admitting: Licensed Clinical Social Worker

## 2017-01-03 NOTE — Telephone Encounter (Signed)
CSW received referral to follow up with patient regarding multiple same day cancellations. CSW attempted to reach patient with no success. Unable to leave message. Please contact CSW if patient returns to clinic. [name], [place], [place]

## 2017-01-09 ENCOUNTER — Telehealth (HOSPITAL_COMMUNITY): Payer: Self-pay

## 2017-01-09 NOTE — Telephone Encounter (Signed)
CHF Clinic appointment reminder call placed to patient for upcoming post-hospital follow up.  LVMTCB to confirm apt.  Patient also reminded to take all medications as prescribed on the day of his/her appointment and to bring all medications to this appointment.  Advised to call our office for tardiness or cancellations/rescheduling needs.  .[name] Genevea  

## 2017-01-10 ENCOUNTER — Encounter (HOSPITAL_COMMUNITY): Payer: Self-pay

## 2017-01-30 ENCOUNTER — Other Ambulatory Visit (HOSPITAL_COMMUNITY): Payer: Self-pay | Admitting: *Deleted

## 2017-01-30 DIAGNOSIS — I272 Pulmonary hypertension, unspecified: Secondary | ICD-10-CM

## 2017-01-30 MED ORDER — HYDRALAZINE HCL 25 MG PO TABS: 25.0000 mg | ORAL_TABLET | Freq: Three times a day (TID) | ORAL | 2 refills | Status: DC

## 2017-01-30 MED ORDER — TORSEMIDE 20 MG PO TABS
40.0000 mg | ORAL_TABLET | Freq: Two times a day (BID) | ORAL | 2 refills | Status: DC
Start: 2017-01-30 — End: 2017-03-26

## 2017-01-30 MED ORDER — AMLODIPINE BESYLATE 10 MG PO TABS
10.0000 mg | ORAL_TABLET | Freq: Every day | ORAL | 2 refills | Status: DC
Start: 2017-01-30 — End: 2017-03-26

## 2017-01-30 MED ORDER — POTASSIUM CHLORIDE CRYS ER 20 MEQ PO TBCR: 20.0000 meq | EXTENDED_RELEASE_TABLET | Freq: Two times a day (BID) | ORAL | 2 refills | Status: DC

## 2017-01-30 MED ORDER — CLONIDINE HCL 0.3 MG PO TABS: 0.3000 mg | ORAL_TABLET | Freq: Two times a day (BID) | ORAL | 2 refills | Status: DC

## 2017-01-30 MED FILL — AMLODIPINE BESYLATE 10 MG T: 10 | 30 days supply | Qty: 30 | Fill #0

## 2017-01-30 MED FILL — cloNIDine HCL 0.3 MG TABS: 0.3 | 30 days supply | Qty: 60 | Fill #0

## 2017-01-30 MED FILL — TORSEMIDE 20 MG TABLET: 20 | 30 days supply | Qty: 120 | Fill #0

## 2017-01-30 MED FILL — POTASSIUM CL ER 20 MEQ TABL: 20 | 30 days supply | Qty: 60 | Fill #0

## 2017-01-30 MED FILL — hydrALAZINE HCL 25 MG TABS: 25 | 30 days supply | Qty: 90 | Fill #0

## 2017-02-06 ENCOUNTER — Ambulatory Visit (HOSPITAL_COMMUNITY)
Admission: RE | Admit: 2017-02-06 | Discharge: 2017-02-06 | Disposition: A | Discharge status: Home / Self Care | Payer: Self-pay | Source: Ambulatory Visit | Attending: Internal Medicine | Admitting: Internal Medicine

## 2017-02-06 ENCOUNTER — Encounter (HOSPITAL_COMMUNITY): Payer: Self-pay

## 2017-02-06 ENCOUNTER — Encounter (HOSPITAL_COMMUNITY): Payer: Self-pay | Admitting: Student

## 2017-02-06 VITALS — BP 182/114 | HR 74 | Wt 270.0 lb

## 2017-02-06 DIAGNOSIS — I4891 Unspecified atrial fibrillation: Secondary | ICD-10-CM

## 2017-02-06 DIAGNOSIS — N183 Chronic kidney disease, stage 3 unspecified: Secondary | ICD-10-CM

## 2017-02-06 DIAGNOSIS — I5032 Chronic diastolic (congestive) heart failure: Secondary | ICD-10-CM | POA: Insufficient documentation

## 2017-02-06 DIAGNOSIS — Z6841 Body Mass Index (BMI) 40.0 and over, adult: Secondary | ICD-10-CM

## 2017-02-06 DIAGNOSIS — I160 Hypertensive urgency: Secondary | ICD-10-CM

## 2017-02-06 LAB — BASIC METABOLIC PANEL
Anion gap: 7 (ref 5–15)
BUN: 55 mg/dL — AB (ref 6–20)
CHLORIDE: 107 mmol/L (ref 101–111)
CO2: 24 mmol/L (ref 22–32)
Calcium: 8.2 mg/dL — ABNORMAL LOW (ref 8.9–10.3)
Creatinine, Ser: 2.43 mg/dL — ABNORMAL HIGH (ref 0.44–1.00)
GFR calc Af Amer: 27 mL/min — ABNORMAL LOW (ref >60)
GFR calc non Af Amer: 23 mL/min — ABNORMAL LOW (ref >60)
GLUCOSE: 124 mg/dL — AB (ref 65–99)
POTASSIUM: 4.2 mmol/L (ref 3.5–5.1)
Sodium: 138 mmol/L (ref 135–145)

## 2017-02-06 MED ORDER — HYDRALAZINE HCL 50 MG PO TABS: 50.0000 mg | ORAL_TABLET | Freq: Three times a day (TID) | ORAL | 11 refills | Status: DC

## 2017-02-06 MED ORDER — PREDNISONE 20 MG PO TABS: 40.0000 mg | ORAL_TABLET | Freq: Every day | ORAL | 0 refills | Status: DC

## 2017-02-06 MED ORDER — COLCHICINE 0.6 MG PO TABS: ORAL_TABLET | ORAL | 0 refills | Status: DC

## 2017-02-06 NOTE — H&P (View-Only) (Signed)
Patient ID: [name], female   DOB: [birth year], 46 y.o.   MRN: [id number]     Advanced Heart Failure Clinic Note   PCP: Dr Berdine Addison HF: Dr. Haroldine Laws   HPI: [name] is a 46 y.o. female AAF with history of HTN, gout, morbid obesity, chronic atrial fibrillation -on eliquis (had DC-CV 73/53/29), and diastolic heart failure.   92/4/26 Echo: diastolic heart failure with ejection fraction of 55-60%, severe LVH, Mildly dilated RV. Mildly to mod dilated RA. moderate pericardial effusion and echocardiographic changes consistent with a possible infiltrative disease. PAPP 72 mmHg   Milrinone stopped 12/26/2011 after RHC c/w high output HF.  RA = 16  RV = 81/14/20  PA = 78/32 (52)  PCW = 21  Fick cardiac output/index = 11.9/4.6  Thermo CO/CI = 9.8/3.8  PVR = 2.6 Woods (Fick)  FA sat = 91%  PA sat = 65%, 72%  SVC sat = 73%  RA sat = 66%  Admitted to [facility] on 12/10/11 for massive volume overload. Transferred to [facility] on 10/26 due to worsening renal function, weakness and volume overload. SPEP/UPEP/fat pad biopsy/bone marrow negative for amyloid. Echo findings thought to be combination of severe HTN and PAH related to obesity-hypoventilation syndrome. Bubble study +. Suspect opening of PFO in setting of PAH. Diuresed ~100 pounds while in house. Cr seems to be new baseline ~2.5. Discharged from [facility] 01/04/12 to [place]. Discharged from [facility]  01/25/12. D/C  weight was 229 pounds.   She presents today for regular follow up.  Weight down another 22 lbs since last visit.  BP high this am. She states she took her hydralazine about an hour prior to her arrival. She is feeling good overall. Denies SOB or DOE around the house. Mild DOE with moderate exertion. Energy improving.  Denies orthopnea or PND. Cough has improved. Denies fever and chills. She is working on getting disability and medicaid, and needs documentation declaring her abilities. No bleeding with Eliquis   Review of  systems complete and found to be negative unless listed in HPI.    Past Medical History:  Diagnosis Date  . Anemia   . Atrial flutter ([place])   . Cellulitis   . Chronic diastolic heart failure ([place])    a. Amyloidosis work-up negative 2013  b. 12/07/2014 ECHO EF 55-60%. LA severely dilated. RV normal RA moderately dilated, Severe LVH.IVC dilated.   . CKD (chronic kidney disease) stage 3, GFR 30-59 ml/min (H[facility])   . Essential hypertension, benign   . Gout   . Gout   . History of cardiac catheterization    a. ectatic coronaries without obstruction 2006 - [place]  . Hypertensive heart disease   . Morbid obesity ([place])   . Persistent atrial fibrillation ([place])   . Pulmonary hypertension ([place])     Current Outpatient Medications  Medication Sig Dispense Refill  . amLODipine (NORVASC) 10 MG tablet Take 1 tablet (10 mg total) by mouth daily. 30 tablet 2  . apixaban (ELIQUIS) 5 MG TABS tablet Take 1 tablet (5 mg total) by mouth 2 (two) times daily. 180 tablet 3  . cloNIDine (CATAPRES) 0.3 MG tablet Take 1 tablet (0.3 mg total) by mouth 2 (two) times daily. May take an additional tab prn for SBP>180 60 tablet 2  . hydrALAZINE (APRESOLINE) 25 MG tablet Take 1 tablet (25 mg total) by mouth 3 (three) times daily. 90 tablet 2  . potassium chloride SA (K-DUR,KLOR-CON) 20 MEQ tablet Take 1 tablet (20  mEq total) by mouth 2 (two) times daily. 60 tablet 2  . torsemide (DEMADEX) 20 MG tablet Take 2 tablets (40 mg total) by mouth 2 (two) times daily. 120 tablet 2   No current facility-administered medications for this encounter.    Vitals:   02/06/17 1337  BP: (!) 182/114  Pulse: 74  SpO2: 100%  Weight: 270 lb (122.5 kg)    Wt Readings from Last 3 Encounters:  02/06/17 270 lb (122.5 kg)  11/30/16 292 lb 6 oz (132.6 kg)  11/23/16 (!) 330 lb (149.7 kg)    PHYSICAL EXAM: General: Well appearing. No resp difficulty. HEENT: Normal Neck: Supple. JVP difficult to assess with body habitus. Doesn't seem  elevated  Carotids 2+ bilat; no bruits. No thyromegaly or nodule noted. Cor: PMI nondisplaced. RRR, No M/G/R noted prominent P2 Lungs: CTAB, normal effort. Abdomen: obese, soft, non-tender, non-distended, no HSM. No bruits or masses. +BS  Extremities: No cyanosis, clubbing, or rash. 1+ edema LLE, Trace to 1+ RLE.  Neuro: Alert & orientedx3, cranial nerves grossly intact. moves all 4 extremities w/o difficulty. Affect pleasant   ASSESSMENT & PLAN:  1) Chronic diastolic HF - Echo 0/9470 EF 55-60%.  - NYHA III symptoms.  - Volume status stable.  - Continue torsemide 40 mg BID.  - Continue KCl 60mEq daily.  - Last RHC 11/2011. With continued high pa pressures on Echo in May, will plan repeat.  - With LVH and refractory HTN, will also check Tc-PYP scan to r/o amyloid.  - Reinforced fluid restriction to < 2 L daily, sodium restriction to less than 2000 mg daily, and the importance of daily weights.    2) Afib- chronic - Rate controlled.  - Continue Eliquis. Denies bleeding.   3) HTN urgency - Continue amlodipine 10 mg daily - Continue clonidine 0.3 mg BID.  - Increase hydralazine to 50 mg TID.  - RTC 2-4 weeks for continued management.  4) CKD Stage III-   - Pre-cath labs today.   5) Obesity - Body mass index is 37.66 kg/m. - Encouraged her to limit portion size.   6) Gout - Cant' afford colchicine ($60) so will give prednisone 40 mg x 3 days. Will need to watch fluid. Pt aware.   Plan for RHC next week and PYP scan pending availability.  Meds adjusted as above.   Shirley Friar, PA-C  1:48 PM   Patient seen and examined with the above-signed Advanced Practice Provider and/or Housestaff. I personally reviewed laboratory data, imaging studies and relevant notes. I independently examined the patient and formulated the important aspects of the plan. I have edited the note to reflect any of my changes or salient points. I have personally discussed the plan with the patient  and/or family.  She is improved. Volume status seems stable despite weight gain but PA pressures remain quite high on previous echo. Will plan repeat RHC to reassess. If pressures remain high will need to consider if she is a candidate for selective pulmonary vasodilators. Will very thick LV will get PYP scan to assess for amyloid. Unable to get MRI due to size.   Glori Bickers, MD  9:36 AM

## 2017-02-06 NOTE — Progress Notes (Signed)
Patient ID: [name], female   DOB: 06-14-1970, 46 y.o.   MRN: [id number]     Advanced Heart Failure Clinic Note   PCP: Dr Berdine Addison HF: Dr. Haroldine Laws   HPI: [name] is a 46 y.o. female AAF with history of HTN, gout, morbid obesity, chronic atrial fibrillation -on eliquis (had DC-CV 40/98/11), and diastolic heart failure.   91/4/78 Echo: diastolic heart failure with ejection fraction of 55-60%, severe LVH, Mildly dilated RV. Mildly to mod dilated RA. moderate pericardial effusion and echocardiographic changes consistent with a possible infiltrative disease. PAPP 72 mmHg   Milrinone stopped 12/26/2011 after RHC c/w high output HF.  RA = 16  RV = 81/14/20  PA = 78/32 (52)  PCW = 21  Fick cardiac output/index = 11.9/4.6  Thermo CO/CI = 9.8/3.8  PVR = 2.6 Woods (Fick)  FA sat = 91%  PA sat = 65%, 72%  SVC sat = 73%  RA sat = 66%  Admitted to [facility] on 12/10/11 for massive volume overload. Transferred to [facility] on 10/26 due to worsening renal function, weakness and volume overload. SPEP/UPEP/fat pad biopsy/bone marrow negative for amyloid. Echo findings thought to be combination of severe HTN and PAH related to obesity-hypoventilation syndrome. Bubble study +. Suspect opening of PFO in setting of PAH. Diuresed ~100 pounds while in house. Cr seems to be new baseline ~2.5. Discharged from [facility] 01/04/12 to [place]. Discharged from [facility]  01/25/12. D/C  weight was 229 pounds.   She presents today for regular follow up.  Weight down another 22 lbs since last visit.  BP high this am. She states she took her hydralazine about an hour prior to her arrival. She is feeling good overall. Denies SOB or DOE around the house. Mild DOE with moderate exertion. Energy improving.  Denies orthopnea or PND. Cough has improved. Denies fever and chills. She is working on getting disability and medicaid, and needs documentation declaring her abilities. No bleeding with Eliquis   Review of  systems complete and found to be negative unless listed in HPI.    Past Medical History:  Diagnosis Date  . Anemia   . Atrial flutter ([place])   . Cellulitis   . Chronic diastolic heart failure ([place])    a. Amyloidosis work-up negative 2013  b. 12/07/2014 ECHO EF 55-60%. LA severely dilated. RV normal RA moderately dilated, Severe LVH.IVC dilated.   . CKD (chronic kidney disease) stage 3, GFR 30-59 ml/min (HCC)   . Essential hypertension, benign   . Gout   . Gout   . History of cardiac catheterization    a. ectatic coronaries without obstruction 2006 - [place]  . Hypertensive heart disease   . Morbid obesity ([place])   . Persistent atrial fibrillation ([place])   . Pulmonary hypertension ([place])     Current Outpatient Medications  Medication Sig Dispense Refill  . amLODipine (NORVASC) 10 MG tablet Take 1 tablet (10 mg total) by mouth daily. 30 tablet 2  . apixaban (ELIQUIS) 5 MG TABS tablet Take 1 tablet (5 mg total) by mouth 2 (two) times daily. 180 tablet 3  . cloNIDine (CATAPRES) 0.3 MG tablet Take 1 tablet (0.3 mg total) by mouth 2 (two) times daily. May take an additional tab prn for SBP>180 60 tablet 2  . hydrALAZINE (APRESOLINE) 25 MG tablet Take 1 tablet (25 mg total) by mouth 3 (three) times daily. 90 tablet 2  . potassium chloride SA (K-DUR,KLOR-CON) 20 MEQ tablet Take 1 tablet (20  mEq total) by mouth 2 (two) times daily. 60 tablet 2  . torsemide (DEMADEX) 20 MG tablet Take 2 tablets (40 mg total) by mouth 2 (two) times daily. 120 tablet 2   No current facility-administered medications for this encounter.    Vitals:   02/06/17 1337  BP: (!) 182/114  Pulse: 74  SpO2: 100%  Weight: 270 lb (122.5 kg)    Wt Readings from Last 3 Encounters:  02/06/17 270 lb (122.5 kg)  11/30/16 292 lb 6 oz (132.6 kg)  11/23/16 (!) 330 lb (149.7 kg)    PHYSICAL EXAM: General: Well appearing. No resp difficulty. HEENT: Normal Neck: Supple. JVP difficult to assess with body habitus. Doesn't seem  elevated  Carotids 2+ bilat; no bruits. No thyromegaly or nodule noted. Cor: PMI nondisplaced. RRR, No M/G/R noted prominent P2 Lungs: CTAB, normal effort. Abdomen: obese, soft, non-tender, non-distended, no HSM. No bruits or masses. +BS  Extremities: No cyanosis, clubbing, or rash. 1+ edema LLE, Trace to 1+ RLE.  Neuro: Alert & orientedx3, cranial nerves grossly intact. moves all 4 extremities w/o difficulty. Affect pleasant   ASSESSMENT & PLAN:  1) Chronic diastolic HF - Echo 09/996 EF 55-60%.  - NYHA III symptoms.  - Volume status stable.  - Continue torsemide 40 mg BID.  - Continue KCl 33mEq daily.  - Last RHC 11/2011. With continued high pa pressures on Echo in May, will plan repeat.  - With LVH and refractory HTN, will also check Tc-PYP scan to r/o amyloid.  - Reinforced fluid restriction to < 2 L daily, sodium restriction to less than 2000 mg daily, and the importance of daily weights.    2) Afib- chronic - Rate controlled.  - Continue Eliquis. Denies bleeding.   3) HTN urgency - Continue amlodipine 10 mg daily - Continue clonidine 0.3 mg BID.  - Increase hydralazine to 50 mg TID.  - RTC 2-4 weeks for continued management.  4) CKD Stage III-   - Pre-cath labs today.   5) Obesity - Body mass index is 37.66 kg/m. - Encouraged her to limit portion size.   6) Gout - Cant' afford colchicine ($60) so will give prednisone 40 mg x 3 days. Will need to watch fluid. Pt aware.   Plan for RHC next week and PYP scan pending availability.  Meds adjusted as above.   Shirley Friar, PA-C  1:48 PM   Patient seen and examined with the above-signed Advanced Practice Provider and/or Housestaff. I personally reviewed laboratory data, imaging studies and relevant notes. I independently examined the patient and formulated the important aspects of the plan. I have edited the note to reflect any of my changes or salient points. I have personally discussed the plan with the patient  and/or family.  She is improved. Volume status seems stable despite weight gain but PA pressures remain quite high on previous echo. Will plan repeat RHC to reassess. If pressures remain high will need to consider if she is a candidate for selective pulmonary vasodilators. Will very thick LV will get PYP scan to assess for amyloid. Unable to get MRI due to size.   Glori Bickers, MD  9:36 AM

## 2017-02-06 NOTE — Patient Instructions (Addendum)
Routine lab work today. Will notify you of abnormal results, otherwise no news is good news!  INCREASE Hydralazine to 50 mg three times daily. Can "double up" on current 25 mg tablets you have at home (Take 2 tabs three times daily). New Rx has been sent to pharmacy for 50 mg tablets (Take 1 tab three times daily). Prescription has been sent to [facility] under the [place]. Address: [address] [place], [place] [address]  Phone: [phone] Located next to [facility].  Take prednisone 40 mg (2 tabs) once every morning for three days (until bottle empty).  You have been scheduled for a heart catheterization Wednesday December 19th. Please see instruction sheet for additional details.  Nuc Med test has been ordered for Thursday December 20th at 12 noon. Arrive to main entrance of [facility] at 11:30 for registration.  Follow up 4-6 weeks.  ___________________________________________________________________ [name] Code: 7412  Take all medication as prescribed the day of your appointment. Bring all medications with you to your appointment.  Do the following things EVERYDAY: 1) Weigh yourself in the morning before breakfast. Write it down and keep it in a log. 2) Take your medicines as prescribed 3) Eat low salt foods-Limit salt (sodium) to 2000 mg per day.  4) Stay as active as you can everyday 5) Limit all fluids for the day to less than 2 liters

## 2017-02-08 ENCOUNTER — Other Ambulatory Visit (HOSPITAL_COMMUNITY): Payer: Self-pay

## 2017-02-08 DIAGNOSIS — R0600 Dyspnea, unspecified: Secondary | ICD-10-CM

## 2017-02-13 ENCOUNTER — Encounter (HOSPITAL_COMMUNITY): Payer: Self-pay | Admitting: *Deleted

## 2017-02-13 ENCOUNTER — Other Ambulatory Visit (HOSPITAL_COMMUNITY): Payer: Self-pay | Admitting: *Deleted

## 2017-02-13 ENCOUNTER — Encounter (HOSPITAL_COMMUNITY)
Admission: RE | Disposition: A | Discharge status: Home / Self Care | Payer: Self-pay | Source: Ambulatory Visit | Attending: Internal Medicine

## 2017-02-13 ENCOUNTER — Encounter (HOSPITAL_COMMUNITY): Payer: Self-pay | Admitting: Internal Medicine

## 2017-02-13 ENCOUNTER — Ambulatory Visit (HOSPITAL_COMMUNITY)
Admission: RE | Admit: 2017-02-13 | Discharge: 2017-02-13 | Disposition: A | Discharge status: Home / Self Care | Payer: Medicaid Other | Source: Ambulatory Visit | Attending: Internal Medicine | Admitting: Internal Medicine

## 2017-02-13 DIAGNOSIS — I482 Chronic atrial fibrillation: Secondary | ICD-10-CM | POA: Insufficient documentation

## 2017-02-13 DIAGNOSIS — I2721 Secondary pulmonary arterial hypertension: Secondary | ICD-10-CM

## 2017-02-13 DIAGNOSIS — I160 Hypertensive urgency: Secondary | ICD-10-CM | POA: Insufficient documentation

## 2017-02-13 DIAGNOSIS — M109 Gout, unspecified: Secondary | ICD-10-CM | POA: Insufficient documentation

## 2017-02-13 DIAGNOSIS — Z79899 Other long term (current) drug therapy: Secondary | ICD-10-CM | POA: Insufficient documentation

## 2017-02-13 DIAGNOSIS — Z7901 Long term (current) use of anticoagulants: Secondary | ICD-10-CM | POA: Insufficient documentation

## 2017-02-13 DIAGNOSIS — R0600 Dyspnea, unspecified: Secondary | ICD-10-CM

## 2017-02-13 DIAGNOSIS — I481 Persistent atrial fibrillation: Secondary | ICD-10-CM | POA: Insufficient documentation

## 2017-02-13 DIAGNOSIS — N183 Chronic kidney disease, stage 3 (moderate): Secondary | ICD-10-CM | POA: Insufficient documentation

## 2017-02-13 DIAGNOSIS — I272 Pulmonary hypertension, unspecified: Secondary | ICD-10-CM | POA: Insufficient documentation

## 2017-02-13 DIAGNOSIS — Z7952 Long term (current) use of systemic steroids: Secondary | ICD-10-CM | POA: Insufficient documentation

## 2017-02-13 DIAGNOSIS — I5032 Chronic diastolic (congestive) heart failure: Secondary | ICD-10-CM | POA: Insufficient documentation

## 2017-02-13 DIAGNOSIS — Z6836 Body mass index (BMI) 36.0-36.9, adult: Secondary | ICD-10-CM | POA: Insufficient documentation

## 2017-02-13 DIAGNOSIS — I130 Hypertensive heart and chronic kidney disease with heart failure and stage 1 through stage 4 chronic kidney disease, or unspecified chronic kidney disease: Secondary | ICD-10-CM | POA: Insufficient documentation

## 2017-02-13 HISTORY — PX: RIGHT HEART CATH: CATH118263

## 2017-02-13 LAB — POCT I-STAT 3, VENOUS BLOOD GAS (G3P V)
ACID-BASE DEFICIT: 1 mmol/L (ref 0.0–2.0)
Acid-base deficit: 3 mmol/L — ABNORMAL HIGH (ref 0.0–2.0)
Acid-base deficit: 4 mmol/L — ABNORMAL HIGH (ref 0.0–2.0)
BICARBONATE: 22.9 mmol/L (ref 20.0–28.0)
Bicarbonate: 21.3 mmol/L (ref 20.0–28.0)
Bicarbonate: 21.3 mmol/L (ref 20.0–28.0)
O2 SAT: 65 %
O2 Saturation: 68 %
O2 Saturation: 71 %
PCO2 VEN: 35.4 mmHg — AB (ref 44.0–60.0)
PCO2 VEN: 36.1 mmHg — AB (ref 44.0–60.0)
PCO2 VEN: 37.2 mmHg — AB (ref 44.0–60.0)
PH VEN: 7.388 (ref 7.250–7.430)
PH VEN: 7.411 (ref 7.250–7.430)
PO2 VEN: 35 mmHg (ref 32.0–45.0)
PO2 VEN: 37 mmHg (ref 32.0–45.0)
TCO2: 22 mmol/L (ref 22–32)
TCO2: 22 mmol/L (ref 22–32)
TCO2: 24 mmol/L (ref 22–32)
pH, Ven: 7.365 (ref 7.250–7.430)
pO2, Ven: 35 mmHg (ref 32.0–45.0)

## 2017-02-13 LAB — CBC
HCT: 32.5 % — ABNORMAL LOW (ref 36.0–46.0)
Hemoglobin: 10.3 g/dL — ABNORMAL LOW (ref 12.0–15.0)
MCH: 30.2 pg (ref 26.0–34.0)
MCHC: 31.7 g/dL (ref 30.0–36.0)
MCV: 95.3 fL (ref 78.0–100.0)
PLATELETS: 258 10*3/uL (ref 150–400)
RBC: 3.41 MIL/uL — AB (ref 3.87–5.11)
RDW: 16.5 % — AB (ref 11.5–15.5)
WBC: 10.1 10*3/uL (ref 4.0–10.5)

## 2017-02-13 LAB — PREGNANCY, URINE: PREG TEST UR: NEGATIVE

## 2017-02-13 SURGERY — RIGHT HEART CATH
Anesthesia: LOCAL

## 2017-02-13 MED ORDER — HEPARIN (PORCINE) IN NACL 2-0.9 UNIT/ML-% IJ SOLN
INTRAMUSCULAR | Status: AC
  Filled 2017-02-13: qty 500

## 2017-02-13 MED ORDER — ACETAMINOPHEN 325 MG PO TABS: 650.0000 mg | ORAL_TABLET | ORAL | Status: DC | PRN

## 2017-02-13 MED ORDER — LIDOCAINE HCL (PF) 1 % IJ SOLN
INTRAMUSCULAR | Status: AC
  Filled 2017-02-13: qty 30

## 2017-02-13 MED ORDER — SODIUM CHLORIDE 0.9 % IV SOLN: 250.0000 mL | INTRAVENOUS | Status: DC | PRN

## 2017-02-13 MED ORDER — ONDANSETRON HCL 4 MG/2ML IJ SOLN: 4.0000 mg | Freq: Four times a day (QID) | INTRAMUSCULAR | Status: DC | PRN

## 2017-02-13 MED ORDER — FENTANYL CITRATE (PF) 100 MCG/2ML IJ SOLN
INTRAMUSCULAR | Status: AC
  Filled 2017-02-13: qty 2

## 2017-02-13 MED ORDER — SODIUM CHLORIDE 0.9% FLUSH: 3.0000 mL | Freq: Two times a day (BID) | INTRAVENOUS | Status: DC

## 2017-02-13 MED ORDER — HEPARIN (PORCINE) IN NACL 2-0.9 UNIT/ML-% IJ SOLN
INTRAMUSCULAR | Status: AC | PRN
  Administered 2017-02-13: 500 mL

## 2017-02-13 MED ORDER — LIDOCAINE HCL (PF) 1 % IJ SOLN
INTRAMUSCULAR | Status: DC | PRN
  Administered 2017-02-13: 2 mL

## 2017-02-13 MED ORDER — ASPIRIN 81 MG PO CHEW
CHEWABLE_TABLET | ORAL | Status: AC
  Filled 2017-02-13: qty 1

## 2017-02-13 MED ORDER — SODIUM CHLORIDE 0.9 % IV SOLN
INTRAVENOUS | Status: DC
  Administered 2017-02-13: 10:00:00 via INTRAVENOUS

## 2017-02-13 MED ORDER — SODIUM CHLORIDE 0.9% FLUSH: 3.0000 mL | INTRAVENOUS | Status: DC | PRN

## 2017-02-13 MED ORDER — FENTANYL CITRATE (PF) 100 MCG/2ML IJ SOLN
INTRAMUSCULAR | Status: DC | PRN
  Administered 2017-02-13: 25 ug via INTRAVENOUS

## 2017-02-13 MED ORDER — MIDAZOLAM HCL 2 MG/2ML IJ SOLN
INTRAMUSCULAR | Status: DC | PRN
  Administered 2017-02-13: 1 mg via INTRAVENOUS

## 2017-02-13 MED ORDER — MIDAZOLAM HCL 2 MG/2ML IJ SOLN
INTRAMUSCULAR | Status: AC
  Filled 2017-02-13: qty 2

## 2017-02-13 MED ORDER — ASPIRIN 81 MG PO CHEW
81.0000 mg | CHEWABLE_TABLET | ORAL | Status: AC
  Administered 2017-02-13: 81 mg via ORAL

## 2017-02-13 SURGICAL SUPPLY — 8 items
CATH BALLN WEDGE 5F 110CM (CATHETERS) ×1 IMPLANT
PACK CARDIAC CATHETERIZATION (CUSTOM PROCEDURE TRAY) ×2 IMPLANT
PROTECTION STATION PRESSURIZED (MISCELLANEOUS) ×2
SHEATH GLIDE SLENDER 4/5FR (SHEATH) ×1 IMPLANT
STATION PROTECTION PRESSURIZED (MISCELLANEOUS) IMPLANT
TRANSDUCER W/STOPCOCK (MISCELLANEOUS) ×2 IMPLANT
TUBING ART PRESS 72  MALE/FEM (TUBING) ×1
TUBING ART PRESS 72 MALE/FEM (TUBING) IMPLANT

## 2017-02-13 NOTE — Progress Notes (Signed)
Received medical records request from [facility] on 02-13-17 CASE # [id number]  Requested records faxed today to [phone].  Original request will be scanned to patient's electronic medical record.

## 2017-02-13 NOTE — Interval H&P Note (Signed)
History and Physical Interval Note:  02/13/2017 11:07 AM  [name]  has presented today for surgery, with the diagnosis of PAH  The various methods of treatment have been discussed with the patient and family. After consideration of risks, benefits and other options for treatment, the patient has consented to  Procedure(s): RIGHT HEART CATH (N/A) as a surgical intervention .  The patient's history has been reviewed, patient examined, no change in status, stable for surgery.  I have reviewed the patient's chart and labs.  Questions were answered to the patient's satisfaction.     Daniel Bensimhon

## 2017-02-13 NOTE — Discharge Instructions (Signed)
This sheet gives you information about how to care for yourself after your procedure. Your health care provider may also give you more specific instructions. If you have problems or questions, contact your health care provider. What can I expect after the procedure? After the procedure, it is common to have:  Bruising or mild discomfort in the area where the IV was inserted (insertion site).  Follow these instructions at home: Eating and drinking  Follow instructions from your health care provider about eating or drinking restrictions.  Drink a lot of fluids for the first several days after the procedure, as directed by your health care provider. This helps to wash (flush) the contrast out of your body. Examples of healthy fluids include water or low-calorie drinks. General instructions  Check your IV insertion area every day for signs of infection. Check for: ? Redness, swelling, or pain. ? Fluid or blood. ? Warmth. ? Pus or a bad smell.  Take over-the-counter and prescription medicines only as told by your health care provider.  Rest and return to your normal activities as told by your health care provider. Ask your health care provider what activities are safe for you.  Do not drive for 24 hours if you were given a medicine to help you relax (sedative), or until your health care provider approves.  Keep all follow-up visits as told by your health care provider. This is important. Contact a health care provider if:  Your skin becomes itchy or you develop a rash or hives.  You have a fever that does not get better with medicine.  You feel nauseous.  You vomit.  You have redness, swelling, or pain around the insertion site.  You have fluid or blood coming from the insertion site.  Your insertion area feels warm to the touch.  You have pus or a bad smell coming from the insertion site. Get help right away if:  You have difficulty breathing or shortness of breath.  You  develop chest pain.  You faint.  You feel very dizzy. These symptoms may represent a serious problem that is an emergency. Do not wait to see if the symptoms will go away. Get medical help right away. Call your local emergency services (911 in the [place] Do not drive yourself to the hospital. Summary  After your procedure, it is common to have bruising or mild discomfort in the area where the IV was inserted.  You should check your IV insertion area every day for signs of infection.  Take over-the-counter and prescription medicines only as told by your health care provider.  You should drink a lot of fluids for the first several days after the procedure to help flush the contrast from your body. This information is not intended to replace advice given to you by your health care provider. Make sure you discuss any questions you have with your health care provider. Document Released: 12/03/2012 Document Revised: 01/07/2016 Document Reviewed: 01/07/2016 Elsevier Interactive Patient Education  2017 [employer].

## 2017-02-14 ENCOUNTER — Encounter (HOSPITAL_COMMUNITY): Payer: Self-pay

## 2017-02-15 ENCOUNTER — Encounter (HOSPITAL_COMMUNITY): Payer: Medicaid Other

## 2017-02-15 ENCOUNTER — Ambulatory Visit (HOSPITAL_COMMUNITY): Payer: Self-pay

## 2017-02-15 ENCOUNTER — Encounter (HOSPITAL_COMMUNITY): Payer: Self-pay

## 2017-03-01 ENCOUNTER — Encounter (HOSPITAL_COMMUNITY): Admission: RE | Admit: 2017-03-01 | Payer: Self-pay | Source: Ambulatory Visit

## 2017-03-01 ENCOUNTER — Encounter (HOSPITAL_COMMUNITY): Payer: Self-pay

## 2017-03-01 ENCOUNTER — Encounter (HOSPITAL_COMMUNITY)
Admission: RE | Admit: 2017-03-01 | Discharge: 2017-03-01 | Disposition: A | Discharge status: Home / Self Care | Payer: Self-pay | Source: Ambulatory Visit | Attending: Student | Admitting: Student

## 2017-03-01 DIAGNOSIS — I5032 Chronic diastolic (congestive) heart failure: Secondary | ICD-10-CM | POA: Insufficient documentation

## 2017-03-01 MED ORDER — TECHNETIUM TC 99M PYROPHOSPHATE
20.0000 | Freq: Once | INTRAVENOUS | Status: DC
  Filled 2017-03-01: qty 20

## 2017-03-01 MED ORDER — TECHNETIUM TC 99M PYROPHOSPHATE
20.0000 | Freq: Once | INTRAVENOUS | Status: AC
  Administered 2017-03-01: 20 via INTRAVENOUS
  Filled 2017-03-01: qty 20

## 2017-03-05 ENCOUNTER — Telehealth: Payer: Self-pay | Admitting: Licensed Clinical Social Worker

## 2017-03-05 NOTE — Telephone Encounter (Signed)
CSW referred to assist patient with transportation to tomorrow's follow up appointment. Patient has no options and therefore CSW contacted [employer] to arrange transportation. Patient verbalizes understanding and appreciative of support. [name], [place], [place]

## 2017-03-06 ENCOUNTER — Encounter (HOSPITAL_COMMUNITY): Payer: Self-pay | Admitting: Internal Medicine

## 2017-03-06 ENCOUNTER — Ambulatory Visit (HOSPITAL_COMMUNITY)
Admission: RE | Admit: 2017-03-06 | Discharge: 2017-03-06 | Disposition: A | Discharge status: Home / Self Care | Payer: Medicaid Other | Source: Ambulatory Visit | Attending: Internal Medicine | Admitting: Internal Medicine

## 2017-03-06 VITALS — BP 163/89 | HR 100 | Wt 268.8 lb

## 2017-03-06 DIAGNOSIS — Z0189 Encounter for other specified special examinations: Secondary | ICD-10-CM | POA: Insufficient documentation

## 2017-03-06 DIAGNOSIS — I160 Hypertensive urgency: Secondary | ICD-10-CM

## 2017-03-06 DIAGNOSIS — I482 Chronic atrial fibrillation: Secondary | ICD-10-CM | POA: Insufficient documentation

## 2017-03-06 DIAGNOSIS — N183 Chronic kidney disease, stage 3 (moderate): Secondary | ICD-10-CM | POA: Insufficient documentation

## 2017-03-06 DIAGNOSIS — I5032 Chronic diastolic (congestive) heart failure: Secondary | ICD-10-CM | POA: Insufficient documentation

## 2017-03-06 DIAGNOSIS — M109 Gout, unspecified: Secondary | ICD-10-CM | POA: Insufficient documentation

## 2017-03-06 DIAGNOSIS — Z6837 Body mass index (BMI) 37.0-37.9, adult: Secondary | ICD-10-CM | POA: Insufficient documentation

## 2017-03-06 DIAGNOSIS — I4892 Unspecified atrial flutter: Secondary | ICD-10-CM | POA: Insufficient documentation

## 2017-03-06 DIAGNOSIS — Z7901 Long term (current) use of anticoagulants: Secondary | ICD-10-CM | POA: Insufficient documentation

## 2017-03-06 DIAGNOSIS — I272 Pulmonary hypertension, unspecified: Secondary | ICD-10-CM

## 2017-03-06 DIAGNOSIS — E8770 Fluid overload, unspecified: Secondary | ICD-10-CM | POA: Insufficient documentation

## 2017-03-06 DIAGNOSIS — I313 Pericardial effusion (noninflammatory): Secondary | ICD-10-CM | POA: Insufficient documentation

## 2017-03-06 DIAGNOSIS — I130 Hypertensive heart and chronic kidney disease with heart failure and stage 1 through stage 4 chronic kidney disease, or unspecified chronic kidney disease: Secondary | ICD-10-CM | POA: Insufficient documentation

## 2017-03-06 DIAGNOSIS — R531 Weakness: Secondary | ICD-10-CM | POA: Insufficient documentation

## 2017-03-06 DIAGNOSIS — I270 Primary pulmonary hypertension: Secondary | ICD-10-CM

## 2017-03-06 DIAGNOSIS — I4891 Unspecified atrial fibrillation: Secondary | ICD-10-CM

## 2017-03-06 LAB — BASIC METABOLIC PANEL
Anion gap: 8 (ref 5–15)
BUN: 43 mg/dL — ABNORMAL HIGH (ref 6–20)
CALCIUM: 8.9 mg/dL (ref 8.9–10.3)
CO2: 23 mmol/L (ref 22–32)
CREATININE: 2.62 mg/dL — AB (ref 0.44–1.00)
Chloride: 108 mmol/L (ref 101–111)
GFR calc Af Amer: 24 mL/min — ABNORMAL LOW (ref >60)
GFR, EST NON AFRICAN AMERICAN: 21 mL/min — AB (ref >60)
Glucose, Bld: 111 mg/dL — ABNORMAL HIGH (ref 65–99)
Potassium: 3.6 mmol/L (ref 3.5–5.1)
SODIUM: 139 mmol/L (ref 135–145)

## 2017-03-06 MED ORDER — HYDRALAZINE HCL 50 MG PO TABS: 75.0000 mg | ORAL_TABLET | Freq: Three times a day (TID) | ORAL | 11 refills | Status: DC

## 2017-03-06 MED ORDER — SPIRONOLACTONE 25 MG PO TABS: 12.5000 mg | ORAL_TABLET | Freq: Every day | ORAL | 3 refills | Status: DC

## 2017-03-06 MED FILL — hydrALAZINE HCL 50 MG TABS: 50 | 34 days supply | Qty: 153 | Fill #0

## 2017-03-06 MED FILL — SPIRONOLACTONE 25 MG TABLET: 25 | 34 days supply | Qty: 17 | Fill #0

## 2017-03-06 NOTE — Progress Notes (Addendum)
Patient ID: [name], female   DOB: Apr 28, 1970, 47 y.o.   MRN: [id number]     Advanced Heart Failure Clinic Note   PCP: Dr Berdine Addison HF: Dr. Haroldine Laws   HPI: [name] is a 47 y.o. female AAF with history of HTN, gout, morbid obesity, chronic atrial fibrillation -on eliquis (had DC-CV 28/31/51), and diastolic heart failure.   76/1/60 Echo: diastolic heart failure with ejection fraction of 55-60%, severe LVH, Mildly dilated RV. Mildly to mod dilated RA. moderate pericardial effusion and echocardiographic changes consistent with a possible infiltrative disease. PAPP 72 mmHg   Milrinone stopped 12/26/2011 after RHC c/w high output HF.  RA = 16  RV = 81/14/20  PA = 78/32 (52)  PCW = 21  Fick cardiac output/index = 11.9/4.6  Thermo CO/CI = 9.8/3.8  PVR = 2.6 Woods (Fick)  FA sat = 91%  PA sat = 65%, 72%  SVC sat = 73%  RA sat = 66%  Admitted to [facility] on 12/10/11 for massive volume overload. Transferred to [facility] on 10/26 due to worsening renal function, weakness and volume overload. SPEP/UPEP/fat pad biopsy/bone marrow negative for amyloid. Echo findings thought to be combination of severe HTN and PAH related to obesity-hypoventilation syndrome. Bubble study +. Suspect opening of PFO in setting of PAH. Diuresed ~100 pounds while in house. Cr seems to be new baseline ~2.5. Discharged from [facility] 01/04/12 to [place]. Discharged from [facility]  01/25/12. D/C  weight was 229 pounds.   She presents today for regular follow up.  Breathing better. Keeping weight down. Taking torsemide 20 bid. RHC as below with moderate to severe PAH. Snores a bunch at night. Weight stable. Unable to perform PYP scan due to claustrophobia. Edema controlled. No orthopnea or PND. No dizziness. No bleeding with Eliquis,    RHC 02/13/17  RA = 9 RV = 82/6 PA = 80/31 (47) PCW = 18 Fick cardiac output/index = 7.2/3.1 PVR = 3.5 WU Ao sat = 96% PA sat = 68%, 70% SVC sat = 65%    Review of  systems complete and found to be negative unless listed in HPI.    Past Medical History:  Diagnosis Date  . Anemia   . Atrial flutter ([place])   . Cellulitis   . Chronic diastolic heart failure ([place])    a. Amyloidosis work-up negative 2013  b. 12/07/2014 ECHO EF 55-60%. LA severely dilated. RV normal RA moderately dilated, Severe LVH.IVC dilated.   . CKD (chronic kidney disease) stage 3, GFR 30-59 ml/min (HCC)   . Essential hypertension, benign   . Gout   . Gout   . History of cardiac catheterization    a. ectatic coronaries without obstruction 2006 - [place]  . Hypertensive heart disease   . Morbid obesity ([place])   . Persistent atrial fibrillation ([place])   . Pulmonary hypertension ([place])     Current Outpatient Medications  Medication Sig Dispense Refill  . amLODipine (NORVASC) 10 MG tablet Take 1 tablet (10 mg total) by mouth daily. 30 tablet 2  . apixaban (ELIQUIS) 5 MG TABS tablet Take 1 tablet (5 mg total) by mouth 2 (two) times daily. 180 tablet 3  . cloNIDine (CATAPRES) 0.3 MG tablet Take 1 tablet (0.3 mg total) by mouth 2 (two) times daily. May take an additional tab prn for SBP>180 60 tablet 2  . hydrALAZINE (APRESOLINE) 50 MG tablet Take 1 tablet (50 mg total) by mouth 3 (three) times daily. 90 tablet 11  .  potassium chloride SA (K-DUR,KLOR-CON) 20 MEQ tablet Take 1 tablet (20 mEq total) by mouth 2 (two) times daily. 60 tablet 2  . torsemide (DEMADEX) 20 MG tablet Take 2 tablets (40 mg total) by mouth 2 (two) times daily. 120 tablet 2  . colchicine 0.6 MG tablet Take 1.2mg  (2 tabs) once today, then another 0.6mg  (1 tab) 1 HR LATER. Then take 0.6mg  (1 tab) once daily for 6 days (until empty). (Patient not taking: Reported on 02/08/2017) 9 tablet 0  . predniSONE (DELTASONE) 20 MG tablet Take 2 tablets (40 mg total) by mouth daily with breakfast. (Patient not taking: Reported on 03/06/2017) 6 tablet 0   No current facility-administered medications for this encounter.     Facility-Administered Medications Ordered in Other Encounters  Medication Dose Route Frequency Provider Last Rate Last Dose  . technetium pyrophosphate Tc 17m injection 20 millicurie  20 millicurie Intravenous Once Richardean Sale, MD       Vitals:   03/06/17 0912  BP: (!) 163/89  Pulse: 100  Weight: 268 lb 12.8 oz (121.9 kg)     Wt Readings from Last 3 Encounters:  03/06/17 268 lb 12.8 oz (121.9 kg)  02/13/17 260 lb (117.9 kg)  02/06/17 270 lb (122.5 kg)    PHYSICAL EXAM: General:  Well appearing. No resp difficulty HEENT: normal Neck: supple. no JVD. Keloid on left neck Carotids 2+ bilat; no bruits. No lymphadenopathy or thryomegaly appreciated. Cor: PMI nondisplaced. Irregular . 2/6 TR  P2 prominent  Lungs: clear with mildly decreased BS  Abdomen: obese soft, nontender, nondistended. No hepatosplenomegaly. No bruits or masses. Good bowel sounds. Extremities: no cyanosis, clubbing, rash, trace to 1 + edema Neuro: alert & orientedx3, cranial nerves grossly intact. moves all 4 extremities w/o difficulty. Affect pleasant   ASSESSMENT & PLAN:  1) Chronic diastolic HF - Echo 06/1698 EF 55-60%.  - Improved. NYHA II symptoms.  - Volume status looks good - Continue torsemide 40 mg BID.  - Continue KCl 13mEq daily.  - Check BMET today - [place] 12/18  with continued moderate PAH. Wedge ok. Will proceed with w/u. May need pulmonary vasodilators  - With LVH and refractory HTN, we orderd Tc-PYP scan to r/o amyloid but she was to claustrophobic. Also can't get MRI. Will reconsider PYP down th road  - Reinforced fluid restriction to < 2 L daily, sodium restriction to less than 2000 mg daily, and the importance of daily weights.    2) Afib- chronic - Rate controlled.  - Continue Eliquis. No bleeding  3) PAH - Check VQ, sleep study and PFTs - Consider Pulmonary vasodilators  4) HTN urgency  - Improving but still high. Increase hydral to 75 tid and add spiro 12.5 daily.  - Check  BMET 1-2 weeks  - CT abdomen without adrenal tumors in 11/13  5) CKD Stage III-   - Check BMET today  6) Obesity - Body mass index is 37.49 kg/m. - Encouraged her to limit portion size.   6) Gout - Stable   Glori Bickers, MD  9:28 AM

## 2017-03-06 NOTE — Progress Notes (Signed)
Heart Failure Clinic Social Work Assessment  ALETA MANTERNACH  presents today in association with an [facility] Appointment.  Patient seen today by CSW for follow up/assistance on Financial difficulties and  Transportation   Social Determinants impacting successful heart failure regimen:  Housing: patient lives with family members. Food: Do you have enough food? Yes  Do you know and understand healthy eating and how that affects your heart failure diagnosis? Yes  Do you follow a low salt diet?  Yes  Utilities: Do you have gas and/or electricity on in your home? Yes  Income: What is your source of income? Pending SSI Insurance: pending medicaid Transportation: Do you have transportation to your medical appointments?No  Patient unable to get transportation today to her appointment although states her sister is often available and anticipates would be able to bring in 2 weeks for follow up testing. Patient has a pending medicaid which would provide transportation in the future once approved.     Daily Health Needs: Do you have a scale and weigh each day?  Yes  Do you have your medications? Yes - currently using the HF fund Are you able to adhere to your medication regimen? Yes   Do you ever take medications differently than prescribed? No  Do you know the zones of Heart Failure?  Yes  Do you know how to contact the HF Clinic appropriately with worsening symptoms or weight increases?  Yes    Do you have an Advanced Directive?  No  If not, would you like to complete? Will discuss further in the future  Do you have any identified obstacles / challenges for adherence to current treatment plan? Yes  Transportation, insurance and finances.  Patient appears to be coping with multiple struggles with transportation and financial issues. Patient appears to be resourceful and following up on needed items to complete applications.   CSW assisted patient with [employer] and  Supportive Counseling with the goal to Increase healthy adjustment to current life circumstances   Heart Failure Clinic Social Worker will continue to coordinate and monitor patient's treatment plan as needed.  CSW continues to be available for identified needs. [name], [place], [place]

## 2017-03-06 NOTE — Patient Instructions (Signed)
Start Spironolactone 12.5 mg (1/2 tab) daily  Increase Hydralazine to 75 mg (1 & 1/2 tabs) Three times a day   Labs today  Labs in 2 weeks  VQ Scan and chest x-ray in 2 weeks  Your physician has recommended that you have a pulmonary function test. Pulmonary Function Tests are a group of tests that measure how well air moves in and out of your lungs.  IN 2 WEEKS  Your physician has recommended that you have a sleep study. This test records several body functions during sleep, including: brain activity, eye movement, oxygen and carbon dioxide blood levels, heart rate and rhythm, breathing rate and rhythm, the flow of air through your mouth and nose, snoring, body muscle movements, and chest and belly movement.  AT [facility]  Your physician recommends that you schedule a follow-up appointment in: 2 months

## 2017-03-20 ENCOUNTER — Encounter (HOSPITAL_COMMUNITY): Payer: Medicaid Other

## 2017-03-20 ENCOUNTER — Other Ambulatory Visit (HOSPITAL_COMMUNITY): Payer: Medicaid Other

## 2017-03-20 ENCOUNTER — Ambulatory Visit (HOSPITAL_COMMUNITY): Payer: Medicaid Other

## 2017-03-25 ENCOUNTER — Telehealth (HOSPITAL_COMMUNITY): Payer: Self-pay

## 2017-03-25 NOTE — Telephone Encounter (Addendum)
Advanced Heart Failure Triage Encounter  Patient Name: [name]  Date of Call: 03/25/17  Problem:  Pt called wanting refills on meds. Pt wants items through HF Fund. Pt has Medicaid and can not get them through HF fund.   Plan:  Pt did not answer when called to tell her HF fund was not avail ible due to having Medicaid. Will tell pt to make pharmacy aware she can not afford for further assistance.   HF Rx faxed 02/27/17 signed by Darrick Grinder NP   Shirley Muscat, RN

## 2017-03-26 ENCOUNTER — Other Ambulatory Visit (HOSPITAL_COMMUNITY): Payer: Self-pay | Admitting: *Deleted

## 2017-03-26 DIAGNOSIS — I272 Pulmonary hypertension, unspecified: Secondary | ICD-10-CM

## 2017-03-26 MED ORDER — AMLODIPINE BESYLATE 10 MG PO TABS: 10.0000 mg | ORAL_TABLET | Freq: Every day | ORAL | 2 refills | Status: DC

## 2017-03-26 MED ORDER — TORSEMIDE 20 MG PO TABS: 40.0000 mg | ORAL_TABLET | Freq: Two times a day (BID) | ORAL | 2 refills | Status: DC

## 2017-03-26 MED ORDER — POTASSIUM CHLORIDE CRYS ER 20 MEQ PO TBCR: 20.0000 meq | EXTENDED_RELEASE_TABLET | Freq: Two times a day (BID) | ORAL | 2 refills | Status: DC

## 2017-03-26 MED ORDER — CLONIDINE HCL 0.3 MG PO TABS: 0.3000 mg | ORAL_TABLET | Freq: Two times a day (BID) | ORAL | 2 refills | Status: DC

## 2017-03-26 MED FILL — TORSEMIDE 20 MG TABLET: 20 | 30 days supply | Qty: 120 | Fill #0

## 2017-03-26 MED FILL — cloNIDine HCL 0.3 MG TABS: 0.3 | 30 days supply | Qty: 60 | Fill #0

## 2017-03-26 MED FILL — AMLODIPINE BESYLATE 10 MG T: 10 | 30 days supply | Qty: 30 | Fill #0

## 2017-03-26 MED FILL — POTASSIUM CL ER 20 MEQ TABL: 20 | 30 days supply | Qty: 60 | Fill #0

## 2017-03-28 ENCOUNTER — Ambulatory Visit (HOSPITAL_COMMUNITY): Payer: Medicaid Other

## 2017-03-28 ENCOUNTER — Other Ambulatory Visit (HOSPITAL_COMMUNITY): Payer: Medicaid Other

## 2017-03-28 ENCOUNTER — Encounter (HOSPITAL_COMMUNITY): Payer: Medicaid Other

## 2017-04-04 ENCOUNTER — Ambulatory Visit (HOSPITAL_COMMUNITY)
Admission: RE | Admit: 2017-04-04 | Discharge: 2017-04-04 | Disposition: A | Discharge status: Home / Self Care | Payer: Self-pay | Source: Ambulatory Visit | Attending: Internal Medicine | Admitting: Internal Medicine

## 2017-04-04 ENCOUNTER — Ambulatory Visit (HOSPITAL_COMMUNITY)
Admission: RE | Admit: 2017-04-04 | Discharge: 2017-04-04 | Disposition: A | Discharge status: Home / Self Care | Payer: Medicaid Other | Source: Ambulatory Visit | Attending: Internal Medicine | Admitting: Internal Medicine

## 2017-04-04 DIAGNOSIS — I517 Cardiomegaly: Secondary | ICD-10-CM | POA: Insufficient documentation

## 2017-04-04 DIAGNOSIS — I5032 Chronic diastolic (congestive) heart failure: Secondary | ICD-10-CM

## 2017-04-04 DIAGNOSIS — I270 Primary pulmonary hypertension: Secondary | ICD-10-CM

## 2017-04-04 DIAGNOSIS — I272 Pulmonary hypertension, unspecified: Secondary | ICD-10-CM | POA: Insufficient documentation

## 2017-04-04 LAB — BASIC METABOLIC PANEL
Anion gap: 15 (ref 5–15)
BUN: 46 mg/dL — ABNORMAL HIGH (ref 6–20)
CHLORIDE: 105 mmol/L (ref 101–111)
CO2: 19 mmol/L — ABNORMAL LOW (ref 22–32)
CREATININE: 2.5 mg/dL — AB (ref 0.44–1.00)
Calcium: 9 mg/dL (ref 8.9–10.3)
GFR calc non Af Amer: 22 mL/min — ABNORMAL LOW (ref >60)
GFR, EST AFRICAN AMERICAN: 26 mL/min — AB (ref >60)
Glucose, Bld: 91 mg/dL (ref 65–99)
POTASSIUM: 4.5 mmol/L (ref 3.5–5.1)
SODIUM: 139 mmol/L (ref 135–145)

## 2017-04-04 LAB — PULMONARY FUNCTION TEST
DL/VA % PRED: 78 %
DL/VA: 4.31 ml/min/mmHg/L
DLCO UNC % PRED: 52 %
DLCO UNC: 17.62 ml/min/mmHg
FEF 25-75 Post: 1.75 L/sec
FEF 25-75 Pre: 1.2 L/sec
FEF2575-%Change-Post: 45 %
FEF2575-%Pred-Post: 55 %
FEF2575-%Pred-Pre: 37 %
FEV1-%Change-Post: 11 %
FEV1-%Pred-Post: 73 %
FEV1-%Pred-Pre: 65 %
FEV1-Post: 2.27 L
FEV1-Pre: 2.03 L
FEV1FVC-%Change-Post: 5 %
FEV1FVC-%Pred-Pre: 82 %
FEV6-%Change-Post: 6 %
FEV6-%PRED-PRE: 79 %
FEV6-%Pred-Post: 84 %
FEV6-POST: 3.16 L
FEV6-Pre: 2.98 L
FEV6FVC-%Change-Post: 0 %
FEV6FVC-%PRED-POST: 100 %
FEV6FVC-%Pred-Pre: 100 %
FVC-%Change-Post: 5 %
FVC-%PRED-PRE: 79 %
FVC-%Pred-Post: 83 %
FVC-POST: 3.2 L
FVC-PRE: 3.02 L
PRE FEV1/FVC RATIO: 67 %
Post FEV1/FVC ratio: 71 %
Post FEV6/FVC ratio: 99 %
Pre FEV6/FVC Ratio: 99 %

## 2017-04-04 MED ORDER — ALBUTEROL SULFATE (2.5 MG/3ML) 0.083% IN NEBU
2.5000 mg | INHALATION_SOLUTION | Freq: Once | RESPIRATORY_TRACT | Status: AC
  Administered 2017-04-04: 2.5 mg via RESPIRATORY_TRACT

## 2017-05-06 ENCOUNTER — Encounter (HOSPITAL_COMMUNITY): Payer: Medicaid Other

## 2017-05-13 ENCOUNTER — Other Ambulatory Visit (HOSPITAL_COMMUNITY): Payer: Self-pay | Admitting: *Deleted

## 2017-05-13 DIAGNOSIS — I272 Pulmonary hypertension, unspecified: Secondary | ICD-10-CM

## 2017-05-13 MED ORDER — HYDRALAZINE HCL 50 MG PO TABS: 75.0000 mg | ORAL_TABLET | Freq: Three times a day (TID) | ORAL | 3 refills | Status: DC

## 2017-05-13 MED ORDER — AMLODIPINE BESYLATE 10 MG PO TABS: 10.0000 mg | ORAL_TABLET | Freq: Every day | ORAL | 2 refills | Status: DC

## 2017-05-13 MED ORDER — CLONIDINE HCL 0.3 MG PO TABS: 0.3000 mg | ORAL_TABLET | Freq: Two times a day (BID) | ORAL | 2 refills | Status: DC

## 2017-05-13 MED ORDER — TORSEMIDE 20 MG PO TABS: 40.0000 mg | ORAL_TABLET | Freq: Two times a day (BID) | ORAL | 2 refills | Status: DC

## 2017-05-13 MED ORDER — POTASSIUM CHLORIDE CRYS ER 20 MEQ PO TBCR: 20.0000 meq | EXTENDED_RELEASE_TABLET | Freq: Two times a day (BID) | ORAL | 2 refills | Status: DC

## 2017-05-13 MED ORDER — SPIRONOLACTONE 25 MG PO TABS: 12.5000 mg | ORAL_TABLET | Freq: Every day | ORAL | 3 refills | Status: AC

## 2017-05-13 MED FILL — cloNIDine HCL 0.3 MG TABS: 0.3 | 30 days supply | Qty: 60 | Fill #0

## 2017-05-13 MED FILL — POTASSIUM CL ER 20 MEQ TABL: 20 | 30 days supply | Qty: 60 | Fill #0

## 2017-05-13 MED FILL — hydrALAZINE HCL 50 MG TABS: 50 | 30 days supply | Qty: 135 | Fill #0

## 2017-05-13 MED FILL — AMLODIPINE BESYLATE 10 MG T: 10 | 30 days supply | Qty: 30 | Fill #0

## 2017-05-13 MED FILL — SPIRONOLACTONE 25 MG TABLET: 25 | 30 days supply | Qty: 15 | Fill #0

## 2017-05-13 MED FILL — TORSEMIDE 20 MG TABLET: 20 | 30 days supply | Qty: 120 | Fill #0

## 2017-05-16 ENCOUNTER — Encounter (HOSPITAL_COMMUNITY): Payer: Self-pay

## 2017-05-16 ENCOUNTER — Ambulatory Visit (HOSPITAL_COMMUNITY)
Admission: RE | Admit: 2017-05-16 | Discharge: 2017-05-16 | Disposition: A | Discharge status: Home / Self Care | Payer: Self-pay | Source: Ambulatory Visit | Attending: Cardiology | Admitting: Cardiology

## 2017-05-16 VITALS — BP 166/94 | HR 92 | Wt 283.0 lb

## 2017-05-16 DIAGNOSIS — Z6841 Body Mass Index (BMI) 40.0 and over, adult: Secondary | ICD-10-CM

## 2017-05-16 DIAGNOSIS — D649 Anemia, unspecified: Secondary | ICD-10-CM

## 2017-05-16 DIAGNOSIS — N183 Chronic kidney disease, stage 3 unspecified: Secondary | ICD-10-CM

## 2017-05-16 DIAGNOSIS — F40240 Claustrophobia: Secondary | ICD-10-CM | POA: Insufficient documentation

## 2017-05-16 DIAGNOSIS — I130 Hypertensive heart and chronic kidney disease with heart failure and stage 1 through stage 4 chronic kidney disease, or unspecified chronic kidney disease: Secondary | ICD-10-CM | POA: Insufficient documentation

## 2017-05-16 DIAGNOSIS — Z7901 Long term (current) use of anticoagulants: Secondary | ICD-10-CM | POA: Insufficient documentation

## 2017-05-16 DIAGNOSIS — I482 Chronic atrial fibrillation: Secondary | ICD-10-CM | POA: Insufficient documentation

## 2017-05-16 DIAGNOSIS — I272 Pulmonary hypertension, unspecified: Secondary | ICD-10-CM | POA: Insufficient documentation

## 2017-05-16 DIAGNOSIS — I5032 Chronic diastolic (congestive) heart failure: Secondary | ICD-10-CM | POA: Insufficient documentation

## 2017-05-16 DIAGNOSIS — Z6839 Body mass index (BMI) 39.0-39.9, adult: Secondary | ICD-10-CM | POA: Insufficient documentation

## 2017-05-16 DIAGNOSIS — M109 Gout, unspecified: Secondary | ICD-10-CM | POA: Insufficient documentation

## 2017-05-16 DIAGNOSIS — I4892 Unspecified atrial flutter: Secondary | ICD-10-CM | POA: Insufficient documentation

## 2017-05-16 DIAGNOSIS — I160 Hypertensive urgency: Secondary | ICD-10-CM | POA: Insufficient documentation

## 2017-05-16 DIAGNOSIS — Z79899 Other long term (current) drug therapy: Secondary | ICD-10-CM | POA: Insufficient documentation

## 2017-05-16 DIAGNOSIS — I270 Primary pulmonary hypertension: Secondary | ICD-10-CM

## 2017-05-16 LAB — CBC
HCT: 29.4 % — ABNORMAL LOW (ref 36.0–46.0)
HEMOGLOBIN: 9.3 g/dL — AB (ref 12.0–15.0)
MCH: 30.6 pg (ref 26.0–34.0)
MCHC: 31.6 g/dL (ref 30.0–36.0)
MCV: 96.7 fL (ref 78.0–100.0)
PLATELETS: 242 10*3/uL (ref 150–400)
RBC: 3.04 MIL/uL — AB (ref 3.87–5.11)
RDW: 16.7 % — ABNORMAL HIGH (ref 11.5–15.5)
WBC: 6.5 10*3/uL (ref 4.0–10.5)

## 2017-05-16 LAB — BASIC METABOLIC PANEL
ANION GAP: 7 (ref 5–15)
BUN: 44 mg/dL — ABNORMAL HIGH (ref 6–20)
CALCIUM: 8.5 mg/dL — AB (ref 8.9–10.3)
CO2: 20 mmol/L — ABNORMAL LOW (ref 22–32)
CREATININE: 2.65 mg/dL — AB (ref 0.44–1.00)
Chloride: 112 mmol/L — ABNORMAL HIGH (ref 101–111)
GFR, EST AFRICAN AMERICAN: 24 mL/min — AB (ref >60)
GFR, EST NON AFRICAN AMERICAN: 21 mL/min — AB (ref >60)
Glucose, Bld: 96 mg/dL (ref 65–99)
Potassium: 4.1 mmol/L (ref 3.5–5.1)
SODIUM: 139 mmol/L (ref 135–145)

## 2017-05-16 LAB — FERRITIN: FERRITIN: 92 ng/mL (ref 11–307)

## 2017-05-16 LAB — IRON AND TIBC
IRON: 33 ug/dL (ref 28–170)
SATURATION RATIOS: 13 % (ref 10.4–31.8)
TIBC: 263 ug/dL (ref 250–450)
UIBC: 230 ug/dL

## 2017-05-16 LAB — VITAMIN B12: VITAMIN B 12: 267 pg/mL (ref 180–914)

## 2017-05-16 LAB — FOLATE: FOLATE: 4.4 ng/mL — AB (ref >5.9)

## 2017-05-16 MED ORDER — HYDRALAZINE HCL 100 MG PO TABS: 100.0000 mg | ORAL_TABLET | Freq: Three times a day (TID) | ORAL | 11 refills | Status: AC

## 2017-05-16 MED ORDER — ALPRAZOLAM 0.5 MG PO TABS: 0.5000 mg | ORAL_TABLET | ORAL | 0 refills | Status: DC | PRN

## 2017-05-16 NOTE — Progress Notes (Signed)
Research Encounter  Patient does not meet inclusion criteria for IV Iron Study.   Leroy Libman, PharmD Pharmacy Resident Pager: [phone]

## 2017-05-16 NOTE — Progress Notes (Signed)
Patient consented for IV iron study. QOL survey completed. Unable to complete a 6MWT.   Ruta Hinds. Velva Harman, PharmD, BCPS, CPP Clinical Pharmacist Phone: [phone] 05/16/2017 10:04 AM

## 2017-05-16 NOTE — Progress Notes (Signed)
Patient ID: [name], female   DOB: [birth year], 47 y.o.   MRN: [id number]     Advanced Heart Failure Clinic Note   PCP: Dr Berdine Addison HF: Dr. Haroldine Laws   HPI: [name] is a 47 y.o. female AAF with history of HTN, gout, morbid obesity, chronic atrial fibrillation -on eliquis (had DC-CV 08/26/14), and diastolic heart failure.   01/0/93 Echo: diastolic heart failure with ejection fraction of 55-60%, severe LVH, Mildly dilated RV. Mildly to mod dilated RA. moderate pericardial effusion and echocardiographic changes consistent with a possible infiltrative disease. PAPP 72 mmHg   Milrinone stopped 12/26/2011 after RHC c/w high output HF.  RA = 16  RV = 81/14/20  PA = 78/32 (52)  PCW = 21  Fick cardiac output/index = 11.9/4.6  Thermo CO/CI = 9.8/3.8  PVR = 2.6 Woods (Fick)  FA sat = 91%  PA sat = 65%, 72%  SVC sat = 73%  RA sat = 66%  Admitted to [facility] on 12/10/11 for massive volume overload. Transferred to [facility] on 10/26 due to worsening renal function, weakness and volume overload. SPEP/UPEP/fat pad biopsy/bone marrow negative for amyloid. Echo findings thought to be combination of severe HTN and PAH related to obesity-hypoventilation syndrome. Bubble study +. Suspect opening of PFO in setting of PAH. Diuresed ~100 pounds while in house. Cr seems to be new baseline ~2.5. Discharged from [facility] 01/04/12 to [place]. Discharged from [facility]  01/25/12. D/C  weight was 229 pounds.   She presents today for follow up. She states her breathing is stable. Weight back up 15 lbs from visit in January. She denies fatigue and SOB, thinks its just weight gain. Needs to reschedule Sleep Study and VQ scan. She forgot, and was too claustrophobic, respectively. She has occasional mild peripheral edema, she is taking torsemide 40 mg BID. Denies bleeding on Eliquis, just easy bruising. She denies orthopnea or palpitations.   RHC 02/13/17  RA = 9 RV = 82/6 PA = 80/31 (47) PCW = 18 Fick  cardiac output/index = 7.2/3.1 PVR = 3.5 WU Ao sat = 96% PA sat = 68%, 70% SVC sat = 65%  PFTs 04/04/17 FVC 3.02 (79%) FEV1 2.03 (65%) DLCO 17.62 (52%)  Review of systems complete and found to be negative unless listed in HPI.    Past Medical History:  Diagnosis Date  . Anemia   . Atrial flutter ([place])   . Cellulitis   . Chronic diastolic heart failure ([place])    a. Amyloidosis work-up negative 2013  b. 12/07/2014 ECHO EF 55-60%. LA severely dilated. RV normal RA moderately dilated, Severe LVH.IVC dilated.   . CKD (chronic kidney disease) stage 3, GFR 30-59 ml/min (HCC)   . Essential hypertension, benign   . Gout   . Gout   . History of cardiac catheterization    a. ectatic coronaries without obstruction 2006 - [place]  . Hypertensive heart disease   . Morbid obesity ([place])   . Persistent atrial fibrillation ([place])   . Pulmonary hypertension ([place])     Current Outpatient Medications  Medication Sig Dispense Refill  . amLODipine (NORVASC) 10 MG tablet Take 1 tablet (10 mg total) by mouth daily. 30 tablet 2  . apixaban (ELIQUIS) 5 MG TABS tablet Take 1 tablet (5 mg total) by mouth 2 (two) times daily. 180 tablet 3  . cloNIDine (CATAPRES) 0.3 MG tablet Take 1 tablet (0.3 mg total) by mouth 2 (two) times daily. May take an additional tab prn for  SBP>180 60 tablet 2  . colchicine 0.6 MG tablet Take 1.2mg  (2 tabs) once today, then another 0.6mg  (1 tab) 1 HR LATER. Then take 0.6mg  (1 tab) once daily for 6 days (until empty). 9 tablet 0  . hydrALAZINE (APRESOLINE) 50 MG tablet Take 1.5 tablets (75 mg total) by mouth 3 (three) times daily. 135 tablet 3  . potassium chloride SA (K-DUR,KLOR-CON) 20 MEQ tablet Take 1 tablet (20 mEq total) by mouth 2 (two) times daily. 60 tablet 2  . spironolactone (ALDACTONE) 25 MG tablet Take 0.5 tablets (12.5 mg total) by mouth daily. 15 tablet 3  . torsemide (DEMADEX) 20 MG tablet Take 2 tablets (40 mg total) by mouth 2 (two) times daily. 120 tablet 2   No  current facility-administered medications for this encounter.    Vitals:   05/16/17 0934  BP: (!) 166/94  Pulse: 92  SpO2: 98%  Weight: 283 lb (128.4 kg)    Wt Readings from Last 3 Encounters:  05/16/17 283 lb (128.4 kg)  03/06/17 268 lb 12.8 oz (121.9 kg)  02/13/17 260 lb (117.9 kg)    PHYSICAL EXAM: General: Well appearing. No resp difficulty. HEENT: Normal Neck: Supple. No JVD. Keloid on left neck. Carotids 2+ bilat; no bruits. No thyromegaly or nodule noted. Cor: PMI nondisplaced. Irregularly irregular. 2/6 TR. P2 Prominent.  Lungs: CTAB, normal effort. Abdomen: Soft, non-tender, non-distended, no HSM. No bruits or masses. +BS  Extremities: No cyanosis, clubbing, or rash. Trace edema.  Neuro: Alert & orientedx3, cranial nerves grossly intact. moves all 4 extremities w/o difficulty. Affect pleasant   ASSESSMENT & PLAN:  1) Chronic diastolic HF - Echo 02/9620 EF 55-60%.  - NYHA II symptoms.  - Volume status stable on torsemide 40 mg BID. BMET today.  - Continue KCl 1mEq daily.  - RHC 12/18  with continued moderate PAH. Wedge ok. Will proceed with w/u. May need pulmonary vasodilators  - With LVH and refractory HTN, we orderd Tc-PYP scan to r/o amyloid but she was to claustrophobic. Also can't get MRI. Will reconsider PYP down th road  - Reinforced fluid restriction to < 2 L daily, sodium restriction to less than 2000 mg daily, and the importance of daily weights.    2) Afib- chronic - Rate controlled.   - Continue Eliquis. Denies bleeding.   3) PAH - Check VQ. Will give 0.5 mg Xanax to use prn prior to procedures. 5 tablets, no refills.  - Needs to schedule sleep study.  - Needs to go for VQ scan.  - PFTs with relatively normal spirometry, but decreased DLCO.  - Consider Pulmonary vasodilators  4) HTN urgency  - Improving but remains elevated.  - Increase hydral to 100 mg TID  - Continue spiro 12.5 mg daily. BMET today.  - CT abdomen without adrenal tumors in  11/13  5) CKD Stage III - BMET study.   6) Obesity - Body mass index is 39.47 kg/m. - Encouraged to continue weight loss.   6) Gout - Stable.   Meds and labs as above. RTC 6 weeks.   Shirley Friar, PA-C  9:48 AM   Greater than 50% of the 25 minute visit was spent in counseling/coordination of care regarding disease state education, salt/fluid restriction, sliding scale diuretics, and medication compliance.

## 2017-05-16 NOTE — Patient Instructions (Addendum)
Routine lab work today. Will notify you of abnormal results, otherwise no news is good news!  INCREASE Hydralazine to 100 mg three times daily. May double up on current 50 mg tabs (Take 2 tabs three times daily). New Rx has been sent to your pharmacy for 100 mg tabs (Take 1 tab three times daily). Prescription has been sent to [facility]. Address: [address] [place], [place] [address]  Phone: [phone] Located next to [facility].  Xanax 0.5 mg tablet once daily as needed for anxiety prior to procedures.  Will schedule you for sleep study at [facility]. Address: [place], [place], [place] [address] Phone: [phone] Their office will call you to schedule.  Will schedule you for VQ scan at [place] at [facility] (Entrance A on [address])-Free [place] parking available. Check in at front desk once you enter through sliding doors.  Follow up 4-6 weeks with Oda Kilts PA-C.  Take all medication as prescribed the day of your appointment. Bring all medications with you to your appointment.  Do the following things EVERYDAY: 1) Weigh yourself in the morning before breakfast. Write it down and keep it in a log. 2) Take your medicines as prescribed 3) Eat low salt foods-Limit salt (sodium) to 2000 mg per day.  4) Stay as active as you can everyday 5) Limit all fluids for the day to less than 2 liters

## 2017-05-16 NOTE — Addendum Note (Signed)
Encounter addended by: Effie Berkshire, RN on: 05/16/2017 10:16 AM  Actions taken: Order list changed, Diagnosis association updated

## 2017-05-21 ENCOUNTER — Telehealth: Payer: Self-pay | Admitting: Licensed Clinical Social Worker

## 2017-05-21 NOTE — Telephone Encounter (Signed)
CSW contacted patient per her request. Patient states she was denied Disability and wanted to let this CSW know that she obtained a lawyer for the appeal process. CSW encouraged patient to call if further assistance needed. Patient verbalizes understanding.[name], [place], [place]

## 2017-05-23 ENCOUNTER — Ambulatory Visit (HOSPITAL_COMMUNITY): Payer: Medicaid Other | Attending: Student

## 2017-05-23 ENCOUNTER — Ambulatory Visit (HOSPITAL_COMMUNITY): Admission: RE | Admit: 2017-05-23 | Payer: Medicaid Other | Source: Ambulatory Visit

## 2017-05-27 ENCOUNTER — Ambulatory Visit: Payer: Medicaid Other | Attending: Student | Admitting: Cardiology

## 2017-05-27 DIAGNOSIS — R0683 Snoring: Secondary | ICD-10-CM | POA: Insufficient documentation

## 2017-05-27 DIAGNOSIS — I4891 Unspecified atrial fibrillation: Secondary | ICD-10-CM | POA: Insufficient documentation

## 2017-05-28 ENCOUNTER — Telehealth: Payer: Self-pay | Admitting: Licensed Clinical Social Worker

## 2017-05-28 NOTE — Telephone Encounter (Signed)
CSW received call from patient requesting assistance with payment of her electric bill. CSW assisted through the patient assistance fund. Patient states her sister will help her financially going forward until she completes the appeal process for disability. CSW available as needed. [name], [name], [name]

## 2017-05-29 NOTE — Progress Notes (Signed)
Thank you.   Please let pt know no sleep Apnea on sleep study

## 2017-05-29 NOTE — Procedures (Signed)
   NAME: [name] DATE OF BIRTH:  1970/05/20 MEDICAL RECORD NUMBER [id number]  LOCATION: [facility]  PHYSICIAN: Kieanna Rollo  DATE OF STUDY: 05/27/2017  SLEEP STUDY TYPE: Nocturnal Polysomnogram               REFERRING PHYSICIAN: Shirley Friar*   Gender: Female  D.O.B: 03/10/1970  Age (years): 46  Referring Provider: Barrington Ellison PA-C  Height (inches): 34  Interpreting Physician: Fransico Him MD, ABSM  Weight (lbs): 302  RPSGT: Rosebud Poles  BMI: 45  MRN: [id number]  Neck Size: 16.50   CLINICAL INFORMATION  Sleep Study Type: NPSG Indication for sleep study: N/A Epworth Sleepiness Score: 2  SLEEP STUDY TECHNIQUE  As per the AASM Manual for the Scoring of Sleep and Associated Events v2.3 (April 2016) with a hypopnea requiring 4% desaturations. The channels recorded and monitored were frontal, central and occipital EEG, electrooculogram (EOG), submentalis EMG (chin), nasal and oral airflow, thoracic and abdominal wall motion, anterior tibialis EMG, snore microphone, electrocardiogram, and pulse oximetry.  MEDICATIONS   Medications self-administered by patient taken the night of the study : N/A  SLEEP ARCHITECTURE  The study was initiated at 10:24:40 PM and ended at 5:11:14 AM. Sleep onset time was 66.2 minutes and the sleep efficiency was 35.0%%. The total sleep time was 142.5 minutes. Stage REM latency was N/A minutes. The patient spent 13.3%% of the night in stage N1 sleep, 86.7%% in stage N2 sleep, 0.0%% in stage N3 and 0.00% in REM. Alpha intrusion was absent. Supine sleep was 43.51%.  RESPIRATORY PARAMETERS  The overall apnea/hypopnea index (AHI) was 0.4 per hour. There were 1 total apneas, including 1 obstructive, 0 central and 0 mixed apneas. There were 0 hypopneas and 0 RERAs. The AHI during Stage REM sleep was N/A per hour. AHI while supine was 1.0 per hour. The mean oxygen saturation was 95.6%. The minimum SpO2 during  sleep was 92.0%. moderate snoring was noted during this study.  CARDIAC DATA  The 2 lead EKG demonstrated atrial fibrillation. The mean heart rate was N/A beats per minute.   LEG MOVEMENT DATA  The total PLMS were 0 with a resulting PLMS index of 0.0. Associated arousal with leg movement index was 1.3   IMPRESSIONS  - No significant obstructive sleep apnea occurred during this study (AHI = 0.4/h) but poor sleep efficiency. - No significant central sleep apnea occurred during this study (CAI = 0.0/h).  - The patient had minimal or no oxygen desaturation during the study (Min O2 = 92.0%) - The patient snored with moderate snoring volume.  - EKG findings include atrial fibrillation. - Clinically significant periodic limb movements did not occur during sleep. No significant associated arousals.  DIAGNOSIS  - Atrial Fibrillation  RECOMMENDATIONS  - Avoid alcohol, sedatives and other CNS depressants that may worsen sleep apnea and disrupt normal sleep architecture. - Sleep hygiene should be reviewed to assess factors that may improve sleep quality.  - Weight management and regular exercise should be initiated or continued if appropriate. - Recommend repeat sleep study with sleep aide as total sleep time was only 1.5 hours.    [place], American Board of Sleep Medicine  ELECTRONICALLY SIGNED ON:  05/29/2017, 10:52 AM [place] PH: [phone]   FX: [phone] [place]

## 2017-06-11 MED FILL — cloNIDine HCL 0.3 MG TABS: 0.3 | 30 days supply | Qty: 60 | Fill #1

## 2017-06-11 MED FILL — TORSEMIDE 20 MG TABLET: 20 | 30 days supply | Qty: 120 | Fill #1

## 2017-06-11 MED FILL — POTASSIUM CL ER 20 MEQ TABL: 20 | 30 days supply | Qty: 60 | Fill #1

## 2017-06-11 MED FILL — hydrALAZINE HCL 100 MG TABS: 100 | 30 days supply | Qty: 90 | Fill #0

## 2017-06-11 MED FILL — AMLODIPINE BESYLATE 10 MG T: 10 | 30 days supply | Qty: 30 | Fill #1

## 2017-06-11 MED FILL — SPIRONOLACTONE 25 MG TABLET: 25 | 30 days supply | Qty: 15 | Fill #1

## 2017-06-18 ENCOUNTER — Telehealth: Payer: Self-pay | Admitting: *Deleted

## 2017-06-18 DIAGNOSIS — I4891 Unspecified atrial fibrillation: Secondary | ICD-10-CM

## 2017-06-18 MED ORDER — ESZOPICLONE 2 MG PO TABS: 2.0000 mg | ORAL_TABLET | Freq: Every evening | ORAL | 0 refills | Status: DC | PRN

## 2017-06-18 NOTE — Telephone Encounter (Signed)
-----   Message from Sueanne Margarita, MD sent at 05/29/2017 10:55 AM EDT ----- Patient had no obvious sleep apnea but unfortunately only had 1.5 hours of sleep which is insufficient to make a diagnosis of sleep apnea versus normal study.  Commend repeat PSG with Lunesta 2 mg p.o. to be taken on arrival in the sleep lab.

## 2017-06-18 NOTE — Telephone Encounter (Signed)
Informed patient of sleep study results and patient understanding was verbalized. Patient understands her sleep study showed no obvious sleep apnea but unfortunately only had 1.5 hours of sleep which is insufficient to make a diagnosis of sleep apnea versus normal study. She is agreeable to retesting at [facility] with lunesta as a sleep aid. Will send RX to [facility] upon patients request. Will resubmit to precert.

## 2017-06-18 NOTE — Addendum Note (Signed)
Addended by: Freada Bergeron on: 06/18/2017 09:05 AM   Modules accepted: Orders

## 2017-06-18 NOTE — Telephone Encounter (Signed)
Called results lmtcb. 

## 2017-06-19 ENCOUNTER — Telehealth (HOSPITAL_COMMUNITY): Payer: Self-pay

## 2017-06-19 NOTE — Telephone Encounter (Signed)
Sleep study results discussed with patient. Per Oda Kilts PA-C no sleep apnea noted, no changes to recommend at this time. Patient does report receiving a call yesterday to repeat the study as she only slept 1.5 hrs. Advised to wait until seen in CHF clinic next week to discuss with provider before rescheduling. Patient aware and agreeable.  Renee Pain, RN

## 2017-06-26 NOTE — Progress Notes (Signed)
Patient ID: [name], female   DOB: 03-Apr-1970, 47 y.o.   MRN: [id number]     Advanced Heart Failure Clinic Note   PCP: Dr Berdine Addison HF: Dr. Haroldine Laws   HPI: [name] is a 47 y.o. female AAF with history of HTN, gout, morbid obesity, chronic atrial fibrillation -on eliquis (had DC-CV 38/75/64), and diastolic heart failure.   33/2/95 Echo: diastolic heart failure with ejection fraction of 55-60%, severe LVH, Mildly dilated RV. Mildly to mod dilated RA. moderate pericardial effusion and echocardiographic changes consistent with a possible infiltrative disease. PAPP 72 mmHg   Milrinone stopped 12/26/2011 after RHC c/w high output HF.  RA = 16  RV = 81/14/20  PA = 78/32 (52)  PCW = 21  Fick cardiac output/index = 11.9/4.6  Thermo CO/CI = 9.8/3.8  PVR = 2.6 Woods (Fick)  FA sat = 91%  PA sat = 65%, 72%  SVC sat = 73%  RA sat = 66%  Admitted to [facility] on 12/10/11 for massive volume overload. Transferred to [facility] on 10/26 due to worsening renal function, weakness and volume overload. SPEP/UPEP/fat pad biopsy/bone marrow negative for amyloid. Echo findings thought to be combination of severe HTN and PAH related to obesity-hypoventilation syndrome. Bubble study +. Suspect opening of PFO in setting of PAH. Diuresed ~100 pounds while in house. Cr seems to be new baseline ~2.5. Discharged from [facility] 01/04/12 to [place]. Discharged from [facility]  01/25/12. D/C  weight was 229 pounds.   She presents today for follow up. Last visit sent for VQ scan and sleep study. Too claustrophobic for VQ scan. Sleep study negative (Though poor sleep efficiency with only around 1.5 hrs of sleep). Weight is down 6 lbs from last visit. She has been taking Dimetapp for the past several weeks due to cough/cold/allergies. She states she has been taking all of her heart medications as directed.  She is complaining of a wound on her R ankle. She hit her foot several weeks ago, and it has gotten progressively  worse. She denies fevers or chills. She denies discharge, but there is serosanguinous drainage on the bandage, as seen below.   RHC 02/13/17  RA = 9 RV = 82/6 PA = 80/31 (47) PCW = 18 Fick cardiac output/index = 7.2/3.1 PVR = 3.5 WU Ao sat = 96% PA sat = 68%, 70% SVC sat = 65%  PFTs 04/04/17 FVC 3.02 (79%) FEV1 2.03 (65%) DLCO 17.62 (52%)  Review of systems complete and found to be negative unless listed in HPI.    Past Medical History:  Diagnosis Date  . Anemia   . Atrial flutter ([place])   . Cellulitis   . Chronic diastolic heart failure ([place])    a. Amyloidosis work-up negative 2013  b. 12/07/2014 ECHO EF 55-60%. LA severely dilated. RV normal RA moderately dilated, Severe LVH.IVC dilated.   . CKD (chronic kidney disease) stage 3, GFR 30-59 ml/min (HCC)   . Essential hypertension, benign   . Gout   . Gout   . History of cardiac catheterization    a. ectatic coronaries without obstruction 2006 - [place]  . Hypertensive heart disease   . Morbid obesity ([place])   . Persistent atrial fibrillation ([place])   . Pulmonary hypertension ([place])     Current Outpatient Medications  Medication Sig Dispense Refill  . ALPRAZolam (XANAX) 0.5 MG tablet Take 1 tablet (0.5 mg total) by mouth as needed for anxiety (For procedures.). 5 tablet 0  . amLODipine (NORVASC)  10 MG tablet Take 1 tablet (10 mg total) by mouth daily. 30 tablet 2  . apixaban (ELIQUIS) 5 MG TABS tablet Take 1 tablet (5 mg total) by mouth 2 (two) times daily. 180 tablet 3  . cloNIDine (CATAPRES) 0.3 MG tablet Take 1 tablet (0.3 mg total) by mouth 2 (two) times daily. May take an additional tab prn for SBP>180 60 tablet 2  . colchicine 0.6 MG tablet Take 1.2mg  (2 tabs) once today, then another 0.6mg  (1 tab) 1 HR LATER. Then take 0.6mg  (1 tab) once daily for 6 days (until empty). 9 tablet 0  . eszopiclone (LUNESTA) 2 MG TABS tablet Take 1 tablet (2 mg total) by mouth at bedtime as needed for sleep. Take immediately before bedtime 1  tablet 0  . hydrALAZINE (APRESOLINE) 100 MG tablet Take 1 tablet (100 mg total) by mouth 3 (three) times daily. 90 tablet 11  . potassium chloride SA (K-DUR,KLOR-CON) 20 MEQ tablet Take 1 tablet (20 mEq total) by mouth 2 (two) times daily. 60 tablet 2  . spironolactone (ALDACTONE) 25 MG tablet Take 0.5 tablets (12.5 mg total) by mouth daily. 15 tablet 3  . torsemide (DEMADEX) 20 MG tablet Take 2 tablets (40 mg total) by mouth 2 (two) times daily. 120 tablet 2   No current facility-administered medications for this visit.    Vitals:   06/27/17 0933  BP: (!) 182/100  Pulse: 88  SpO2: 98%  Weight: 277 lb 9.6 oz (125.9 kg)   Wt Readings from Last 3 Encounters:  06/27/17 277 lb 9.6 oz (125.9 kg)  05/16/17 283 lb (128.4 kg)  03/06/17 268 lb 12.8 oz (121.9 kg)   PHYSICAL EXAM: General: Well appearing. No resp difficulty. HEENT: Normal Neck: Supple. JVP 5-6. Carotids 2+ bilat; no bruits. No thyromegaly or nodule noted. Cor: PMI nondisplaced. Irregularly irregular. 2/6 TR. Prominent P2.  Lungs: CTAB, normal effort. Abdomen: Soft, non-tender, non-distended, no HSM. No bruits or masses. +BS  Extremities: No cyanosis or clubbing. Trace to 1+ ankle edema. RLE ankle with un-stageable ulcer just superior to her medial malleolus. Neuro: Alert & orientedx3, cranial nerves grossly intact. moves all 4 extremities w/o difficulty. Affect pleasant       ASSESSMENT & PLAN:  1) Chronic diastolic HF - Echo 04/2949 EF 55-60%.  - NYHA III chronincally - Volume status looks OK on exam.  - Continue torsemide 40 mg BID. Will not do labs today as sending patient to ED.  - Continue KCl 60mEq daily.  - RHC 12/18  with continued moderate PAH. Wedge ok. Will proceed with w/u. May need pulmonary vasodilators  - With LVH and refractory HTN, we orderd Tc-PYP scan to r/o amyloid but she was too claustrophobic. Also can't get MRI. Will reconsider PYP down the road.  - Reinforced fluid restriction to < 2 L  daily, sodium restriction to less than 2000 mg daily, and the importance of daily weights.    2) Afib- chronic - Rate controlled.    - No bleeding on Eliquis.   3) PAH - Sleep study negative for OSA.  - Still has not attended VQ scan.  - PFTs with relatively normal spirometry, but decreased DLCO.  - Consider Pulmonary vasodilators  4) HTN urgency  - Educated on medications NOT to take for cough with CHF/HTN. Given list of appropriate meds. She has been taking Dimetapp (w/ Phenylephrine) - Continue hydral 100 mg TID  - Continue spiro 12.5 mg daily. BMET today.  - CT abdomen without adrenal  tumors in 11/13  5) CKD Stage III - BMET study.    6) Obesity - Body mass index is 38.72 kg/m. - Encouraged to continue weight loss.   6) Gout - Stable.   7) RLE wound - RLE ankle with un-stageable ulcer just superior to her medial malleolus. - She has no insurance so has not been seen by any other provider. She has been covering it with bandaids. No salves or ointments.  - Due to lack of insurance, only present option is to go to ED for treatment.  - Will speak with our social worker about options for primary care or insurance for her.   RTC 4 weeks with APP, 2 months with MD. Will send to ED for further evaluation of her ulcer/abscess. May need I&D.  Shirley Friar, PA-C  2:42 PM   Greater than 50% of the 25 minute visit was spent in counseling/coordination of care regarding disease state education, salt/fluid restriction, sliding scale diuretics, and medication compliance.

## 2017-06-27 ENCOUNTER — Emergency Department (HOSPITAL_COMMUNITY): Payer: Self-pay

## 2017-06-27 ENCOUNTER — Encounter (HOSPITAL_COMMUNITY): Payer: Self-pay

## 2017-06-27 ENCOUNTER — Encounter (HOSPITAL_COMMUNITY): Payer: Self-pay | Admitting: Emergency Medicine

## 2017-06-27 ENCOUNTER — Emergency Department (HOSPITAL_COMMUNITY)
Admission: EM | Admit: 2017-06-27 | Discharge: 2017-06-27 | Disposition: A | Discharge status: Home / Self Care | Payer: Self-pay | Attending: Emergency Medicine | Admitting: Emergency Medicine

## 2017-06-27 ENCOUNTER — Ambulatory Visit (HOSPITAL_COMMUNITY)
Admission: RE | Admit: 2017-06-27 | Discharge: 2017-06-27 | Disposition: A | Discharge status: Home / Self Care | Payer: Self-pay | Source: Ambulatory Visit | Attending: Internal Medicine | Admitting: Internal Medicine

## 2017-06-27 ENCOUNTER — Other Ambulatory Visit: Payer: Self-pay

## 2017-06-27 VITALS — BP 182/100 | HR 88 | Wt 277.6 lb

## 2017-06-27 DIAGNOSIS — I160 Hypertensive urgency: Secondary | ICD-10-CM | POA: Insufficient documentation

## 2017-06-27 DIAGNOSIS — Z7901 Long term (current) use of anticoagulants: Secondary | ICD-10-CM | POA: Insufficient documentation

## 2017-06-27 DIAGNOSIS — I5032 Chronic diastolic (congestive) heart failure: Secondary | ICD-10-CM | POA: Insufficient documentation

## 2017-06-27 DIAGNOSIS — I481 Persistent atrial fibrillation: Secondary | ICD-10-CM | POA: Insufficient documentation

## 2017-06-27 DIAGNOSIS — I4892 Unspecified atrial flutter: Secondary | ICD-10-CM | POA: Insufficient documentation

## 2017-06-27 DIAGNOSIS — L89510 Pressure ulcer of right ankle, unstageable: Secondary | ICD-10-CM

## 2017-06-27 DIAGNOSIS — I482 Chronic atrial fibrillation: Secondary | ICD-10-CM | POA: Insufficient documentation

## 2017-06-27 DIAGNOSIS — I130 Hypertensive heart and chronic kidney disease with heart failure and stage 1 through stage 4 chronic kidney disease, or unspecified chronic kidney disease: Secondary | ICD-10-CM | POA: Insufficient documentation

## 2017-06-27 DIAGNOSIS — M109 Gout, unspecified: Secondary | ICD-10-CM | POA: Insufficient documentation

## 2017-06-27 DIAGNOSIS — Z79899 Other long term (current) drug therapy: Secondary | ICD-10-CM | POA: Insufficient documentation

## 2017-06-27 DIAGNOSIS — Z6838 Body mass index (BMI) 38.0-38.9, adult: Secondary | ICD-10-CM | POA: Insufficient documentation

## 2017-06-27 DIAGNOSIS — I2721 Secondary pulmonary arterial hypertension: Secondary | ICD-10-CM | POA: Insufficient documentation

## 2017-06-27 DIAGNOSIS — L97319 Non-pressure chronic ulcer of right ankle with unspecified severity: Secondary | ICD-10-CM | POA: Insufficient documentation

## 2017-06-27 DIAGNOSIS — I4891 Unspecified atrial fibrillation: Secondary | ICD-10-CM

## 2017-06-27 DIAGNOSIS — I10 Essential (primary) hypertension: Secondary | ICD-10-CM

## 2017-06-27 DIAGNOSIS — N183 Chronic kidney disease, stage 3 (moderate): Secondary | ICD-10-CM | POA: Insufficient documentation

## 2017-06-27 DIAGNOSIS — R0600 Dyspnea, unspecified: Secondary | ICD-10-CM

## 2017-06-27 DIAGNOSIS — Z6841 Body Mass Index (BMI) 40.0 and over, adult: Secondary | ICD-10-CM

## 2017-06-27 LAB — CBC WITH DIFFERENTIAL/PLATELET
Basophils Absolute: 0.1 10*3/uL (ref 0.0–0.1)
Basophils Relative: 0 %
Eosinophils Absolute: 0.1 10*3/uL (ref 0.0–0.7)
Eosinophils Relative: 1 %
HEMATOCRIT: 35.9 % — AB (ref 36.0–46.0)
HEMOGLOBIN: 11.1 g/dL — AB (ref 12.0–15.0)
LYMPHS ABS: 1.7 10*3/uL (ref 0.7–4.0)
LYMPHS PCT: 15 %
MCH: 29.4 pg (ref 26.0–34.0)
MCHC: 30.9 g/dL (ref 30.0–36.0)
MCV: 95 fL (ref 78.0–100.0)
MONO ABS: 0.9 10*3/uL (ref 0.1–1.0)
MONOS PCT: 8 %
NEUTROS PCT: 76 %
Neutro Abs: 8.5 10*3/uL — ABNORMAL HIGH (ref 1.7–7.7)
Platelets: 281 10*3/uL (ref 150–400)
RBC: 3.78 MIL/uL — ABNORMAL LOW (ref 3.87–5.11)
RDW: 15.2 % (ref 11.5–15.5)
WBC: 11.2 10*3/uL — ABNORMAL HIGH (ref 4.0–10.5)

## 2017-06-27 LAB — BASIC METABOLIC PANEL
Anion gap: 11 (ref 5–15)
BUN: 64 mg/dL — ABNORMAL HIGH (ref 6–20)
CALCIUM: 9.2 mg/dL (ref 8.9–10.3)
CHLORIDE: 115 mmol/L — AB (ref 101–111)
CO2: 19 mmol/L — AB (ref 22–32)
CREATININE: 2.62 mg/dL — AB (ref 0.44–1.00)
GFR calc Af Amer: 24 mL/min — ABNORMAL LOW (ref >60)
GFR calc non Af Amer: 21 mL/min — ABNORMAL LOW (ref >60)
GLUCOSE: 92 mg/dL (ref 65–99)
Potassium: 5 mmol/L (ref 3.5–5.1)
Sodium: 145 mmol/L (ref 135–145)

## 2017-06-27 MED ORDER — TRAMADOL HCL 50 MG PO TABS
50.0000 mg | ORAL_TABLET | Freq: Once | ORAL | Status: AC
Start: 2017-06-27 — End: 2017-06-27
  Administered 2017-06-27: 50 mg via ORAL
  Filled 2017-06-27: qty 1

## 2017-06-27 MED ORDER — HYDROCODONE-ACETAMINOPHEN 5-325 MG PO TABS
1.0000 | ORAL_TABLET | Freq: Once | ORAL | Status: AC
  Administered 2017-06-27: 1 via ORAL
  Filled 2017-06-27: qty 1

## 2017-06-27 MED ORDER — SULFAMETHOXAZOLE-TRIMETHOPRIM 800-160 MG PO TABS
1.0000 | ORAL_TABLET | Freq: Once | ORAL | Status: AC
  Administered 2017-06-27: 1 via ORAL
  Filled 2017-06-27: qty 1

## 2017-06-27 MED ORDER — SULFAMETHOXAZOLE-TRIMETHOPRIM 800-160 MG PO TABS: 1.0000 | ORAL_TABLET | Freq: Two times a day (BID) | ORAL | 0 refills | Status: AC

## 2017-06-27 MED ORDER — TRAMADOL HCL 50 MG PO TABS: 50.0000 mg | ORAL_TABLET | Freq: Four times a day (QID) | ORAL | 0 refills | Status: DC | PRN

## 2017-06-27 NOTE — ED Provider Notes (Signed)
[facility] EMERGENCY DEPARTMENT Provider Note   CSN: [id number] Arrival date & time: 06/27/17  1534     History   Chief Complaint Chief Complaint  Patient presents with  . Foot Pain    HPI [name] is a 47 y.o. female with a history of chf, chronic kidney disease, htn and a fib on Eliquis presenting after her cardiology office visit at his recommendation for further evaluation of a wound on her right medial ankle. She reports having a small laceration at this site about 3 weeks ago when she struck on the bottom wrung of a barstool.  She washed the injury site using peroxide and since has been using soap and water wash daily but has an enlarging wound with purulent drainage. She does endorse the site is actually smaller than it was a week ago as it is not as deep as it was but is its maximum diameter today.  She reports intermittent pain, othewise it does not bother her.  She denies fevers, chills, n/v or other complaint.  She is not a diabetic.  HPI  Past Medical History:  Diagnosis Date  . Anemia   . Atrial flutter ([place])   . Cellulitis   . Chronic diastolic heart failure ([place])    a. Amyloidosis work-up negative 2013  b. 12/07/2014 ECHO EF 55-60%. LA severely dilated. RV normal RA moderately dilated, Severe LVH.IVC dilated.   . CKD (chronic kidney disease) stage 3, GFR 30-59 ml/min (HCC)   . Essential hypertension, benign   . Gout   . Gout   . History of cardiac catheterization    a. ectatic coronaries without obstruction 2006 - [place]  . Hypertensive heart disease   . Morbid obesity ([place])   . Persistent atrial fibrillation ([place])   . Pulmonary hypertension [facility])     Patient Active Problem List   Diagnosis Date Noted  . Acute pulmonary edema (HCC)   . Hypertensive emergency 06/26/2016  . Hypertensive urgency 06/26/2016  . CKD (chronic kidney disease) stage 3, GFR 30-59 ml/min (HCC)   . Chronic diastolic heart failure ([place]) 02/06/2012  . HTN (hypertension)  02/06/2012  . Anemia 12/27/2011  . Pulmonary hypertension ([place]) 12/15/2011  . Cellulitis of left leg 12/15/2011  . Anasarca 12/10/2011  . Accelerated hypertension 12/10/2011  . Elevated serum creatinine 12/10/2011  . Hyperbilirubinemia 12/10/2011  . Gout attack 12/10/2011  . Atrial fibrillation with controlled ventricular response ([place]) 12/10/2011  . Morbid obesity ([place]) 12/10/2011  . Acquired trigger finger 12/12/2010  . LATERAL EPICONDYLITIS 06/06/2009  . BURSITIS, OLECRANON 12/10/2007    Past Surgical History:  Procedure Laterality Date  . CARDIOVERSION  01/15/2012   Procedure: CARDIOVERSION;  Surgeon: Jolaine Artist, MD;  Location: [place];  Service: Cardiovascular;  Laterality: N/A;  . RIGHT HEART CATH N/A 02/13/2017   Procedure: RIGHT HEART CATH;  Surgeon: Jolaine Artist, MD;  Location: [place] CV LAB;  Service: Cardiovascular;  Laterality: N/A;  . RIGHT HEART CATHETERIZATION N/A 12/26/2011   Procedure: RIGHT HEART CATH;  Surgeon: Jolaine Artist, MD;  Location: [facility] CATH LAB;  Service: Cardiovascular;  Laterality: N/A;  . SKIN BIOPSY  12/18/2011   Procedure: BIOPSY SKIN;  Surgeon: Donato Heinz, MD;  Location: [facility];  Service: General;  Laterality: N/A;  in the minor room.  . TEE WITHOUT CARDIOVERSION  01/15/2012   Procedure: TRANSESOPHAGEAL ECHOCARDIOGRAM (TEE);  Surgeon: Jolaine Artist, MD;  Location: [facility] ENDOSCOPY;  Service: Cardiovascular;  Laterality: N/A;  cardioversion/on xarelto  OB History    Gravida      Para      Term      Preterm      AB      Living  0     SAB      TAB      Ectopic      Multiple      Live Births               Home Medications    Prior to Admission medications   Medication Sig Start Date End Date Taking? Authorizing Provider  ALPRAZolam Duanne Moron) 0.5 MG tablet Take 1 tablet (0.5 mg total) by mouth as needed for anxiety (For procedures.). 05/16/17   Shirley Friar, PA-C  amLODipine  (NORVASC) 10 MG tablet Take 1 tablet (10 mg total) by mouth daily. 05/13/17   Bensimhon, Shaune Pascal, MD  apixaban (ELIQUIS) 5 MG TABS tablet Take 1 tablet (5 mg total) by mouth 2 (two) times daily. 11/23/16   Arbutus Leas, NP  cloNIDine (CATAPRES) 0.3 MG tablet Take 1 tablet (0.3 mg total) by mouth 2 (two) times daily. May take an additional tab prn for SBP>180 05/13/17   Bensimhon, Shaune Pascal, MD  hydrALAZINE (APRESOLINE) 100 MG tablet Take 1 tablet (100 mg total) by mouth 3 (three) times daily. 05/16/17   Shirley Friar, PA-C  potassium chloride SA (K-DUR,KLOR-CON) 20 MEQ tablet Take 1 tablet (20 mEq total) by mouth 2 (two) times daily. 05/13/17   Bensimhon, Shaune Pascal, MD  spironolactone (ALDACTONE) 25 MG tablet Take 0.5 tablets (12.5 mg total) by mouth daily. 05/13/17   Bensimhon, Shaune Pascal, MD  sulfamethoxazole-trimethoprim (BACTRIM DS,SEPTRA DS) 800-160 MG tablet Take 1 tablet by mouth 2 (two) times daily for 10 days. 06/27/17 07/07/17  Evalee Jefferson, PA-C  torsemide (DEMADEX) 20 MG tablet Take 2 tablets (40 mg total) by mouth 2 (two) times daily. 05/13/17   Bensimhon, Shaune Pascal, MD  traMADol (ULTRAM) 50 MG tablet Take 1 tablet (50 mg total) by mouth every 6 (six) hours as needed. 06/27/17   Evalee Jefferson, PA-C    Family History Family History  Problem Relation Age of Onset  . Hypertension Mother   . Diabetes Mother     Social History Social History   Tobacco Use  . Smoking status: Never Smoker  . Smokeless tobacco: Never Used  Substance Use Topics  . Alcohol use: No  . Drug use: No     Allergies   Penicillins   Review of Systems Review of Systems  Constitutional: Negative for chills and fever.  HENT: Negative.   Eyes: Negative for visual disturbance.  Respiratory: Negative for shortness of breath and wheezing.   Cardiovascular: Negative for chest pain.  Skin: Positive for wound.  Neurological: Negative for numbness and headaches.     Physical Exam Updated Vital Signs BP (!)  186/116 (BP Location: Right Arm)   Pulse 66   Temp 97.9 F (36.6 C) (Oral)   Resp 18   Ht 5\' 11"  (1.803 m)   Wt 125.6 kg (277 lb)   SpO2 100%   BMI 38.63 kg/m   Physical Exam  Constitutional: She is oriented to person, place, and time. She appears well-developed and well-nourished.  HENT:  Head: Normocephalic.  Cardiovascular: Normal rate.  Pulses:      Dorsalis pedis pulses are 2+ on the right side, and 2+ on the left side.  hypertensive  Pulmonary/Chest: Effort normal.  Musculoskeletal: She exhibits tenderness.  Neurological: She is alert and oriented to person, place, and time. No sensory deficit.  Skin:  Deep ulceration right medial ankle with white adherent exudate, erythematous skin around the wound edges.  No active drainage.  Wound measures 5 x 3.5 cm         ED Treatments / Results  Labs (all labs ordered are listed, but only abnormal results are displayed) Labs Reviewed  CBC WITH DIFFERENTIAL/PLATELET - Abnormal; Notable for the following components:      Result Value   WBC 11.2 (*)    RBC 3.78 (*)    Hemoglobin 11.1 (*)    HCT 35.9 (*)    Neutro Abs 8.5 (*)    All other components within normal limits  BASIC METABOLIC PANEL - Abnormal; Notable for the following components:   Chloride 115 (*)    CO2 19 (*)    BUN 64 (*)    Creatinine, Ser 2.62 (*)    GFR calc non Af Amer 21 (*)    GFR calc Af Amer 24 (*)    All other components within normal limits    EKG None  Radiology Dg Ankle Complete Right  Result Date: 06/27/2017 CLINICAL DATA:  Deep wound involving the medial right ankle. Assessment for osteomyelitis. EXAM: RIGHT ANKLE - COMPLETE 3+ VIEW COMPARISON:  None. FINDINGS: There is prominent soft tissue ulceration overlying the medial malleolus without definite underlying osseous erosion or dissecting soft tissue emphysema. No acute fracture or dislocation is identified. Bulky dorsal midfoot spurring is noted. There are also posterior and plantar  calcaneal enthesophytes. Atherosclerotic vascular calcifications are age advanced. IMPRESSION: No radiographic evidence of active osteomyelitis. Electronically Signed   By: Logan Bores M.D.   On: 06/27/2017 16:50    Procedures Procedures (including critical care time)  Medications Ordered in ED Medications  HYDROcodone-acetaminophen (NORCO/VICODIN) 5-325 MG per tablet 1 tablet (1 tablet Oral Given 06/27/17 1707)  sulfamethoxazole-trimethoprim (BACTRIM DS,SEPTRA DS) 800-160 MG per tablet 1 tablet (1 tablet Oral Given 06/27/17 1821)  traMADol (ULTRAM) tablet 50 mg (50 mg Oral Given 06/27/17 1821)     Initial Impression / Assessment and Plan / ED Course  I have reviewed the triage vital signs and the nursing notes.  Pertinent labs & imaging results that were available during my care of the patient were reviewed by me and considered in my medical decision making (see chart for details).     Labs and imaging reviewed with patient.  She was placed on bactrim, discussed wound care including soapy water wash bid, followed by application of a wet to dry dressing.  This was placed for her, with instructions given, supplies for reapplication of dressings. She was [facility] to [facility] for a recheck of her wound within the next 5 days - she is aware of plan and will call for appt.  BP elevated today as it was at her cardiology visit.  Pt has been taking phenylephrine product for uri sx, felt to be cause of bp elevation, she was advised to dc at her cards visit today. BP meds were not changed at that visit.  No sx or labwork today suggesting end organ damage.    Final Clinical Impressions(s) / ED Diagnoses   Final diagnoses:  Lower limb ulcer, ankle, right, with unspecified severity ([place])  Hypertension, unspecified type    ED Discharge Orders        Ordered    sulfamethoxazole-trimethoprim (BACTRIM DS,SEPTRA DS) 800-160 MG tablet  2 times daily  06/27/17 1802    traMADol (ULTRAM) 50 MG  tablet  Every 6 hours PRN     06/27/17 1802       Evalee Jefferson, Hershal Coria 06/28/17 0046    Virgel Manifold, MD 06/28/17 [phone]

## 2017-06-27 NOTE — ED Triage Notes (Signed)
Pt hit foot on bar stool x3 weeks ago.  C/o of pain and wound since.

## 2017-06-27 NOTE — Discharge Instructions (Addendum)
Apply the wet to dry dressing daily to start getting this wound debrided (the white exudate) so it can start healing and filling in.  Take the entire course of the antibiotics prescribed.  The antibiotic written is inexpensive but a good antibiotic for this kind of infection. Get rechecked here sooner if you develop any new or worsening symptoms.  [facility]  [address] [place], [place] [address] [phone]  Services [facility] offers a variety of basic health services.  Services include but are not limited to: Blood pressure checks  Heart rate checks  Blood sugar checks  Urine analysis  Rapid strep tests  Pregnancy tests.  Health education and referrals  People needing more complex services will be directed to a physician online. Using these virtual visits, doctors can evaluate and prescribe medicine and treatments. There will be no medication on-site, though [facility] will help patients fill their prescriptions at little to no cost.   For More information please go to: [email or web address]

## 2017-06-27 NOTE — Patient Instructions (Signed)
You have been instructed to go to the Emergency Room for wound check.   Follow up in clinic in 4 weeks.  Follow up with Dr. Haroldine Laws in 2 Months.

## 2017-07-10 DIAGNOSIS — Z139 Encounter for screening, unspecified: Secondary | ICD-10-CM

## 2017-07-10 LAB — GLUCOSE, POCT (MANUAL RESULT ENTRY): POC GLUCOSE: 89 mg/dL (ref 70–99)

## 2017-07-10 NOTE — Congregational Nurse Program (Signed)
Congregational Nurse Program Note  Date of Encounter: 07/10/2017  Past Medical History: Past Medical History:  Diagnosis Date  . Anemia   . Atrial flutter ([place])   . Cellulitis   . Chronic diastolic heart failure ([place])    a. Amyloidosis work-up negative 2013  b. 12/07/2014 ECHO EF 55-60%. LA severely dilated. RV normal RA moderately dilated, Severe LVH.IVC dilated.   . CKD (chronic kidney disease) stage 3, GFR 30-59 ml/min (HCC)   . Essential hypertension, benign   . Gout   . Gout   . History of cardiac catheterization    a. ectatic coronaries without obstruction 2006 - [place]  . Hypertensive heart disease   . Morbid obesity ([place])   . Persistent atrial fibrillation ([place])   . Pulmonary hypertension ([place])     Encounter Details: CNP Questionnaire - 07/10/17 1539      Questionnaire   Medical Provider  No       Addendum. RN did attempt to get a same day appointment with [facility] on 0515[facility]19 however client states she cannot go this afternoon due to borrowing her sisters car. Appointment therefore made for earliest next appointment of 05.16/19 at [facility]

## 2017-07-10 NOTE — Congregational Nurse Program (Signed)
Congregational Nurse Program Note  Date of Encounter: 07/10/2017  Past Medical History: Past Medical History:  Diagnosis Date  . Anemia   . Atrial flutter ([place])   . Cellulitis   . Chronic diastolic heart failure ([place])    a. Amyloidosis work-up negative 2013  b. 12/07/2014 ECHO EF 55-60%. LA severely dilated. RV normal RA moderately dilated, Severe LVH.IVC dilated.   . CKD (chronic kidney disease) stage 3, GFR 30-59 ml/min (HCC)   . Essential hypertension, benign   . Gout   . Gout   . History of cardiac catheterization    a. ectatic coronaries without obstruction 2006 - [place]  . Hypertensive heart disease   . Morbid obesity ([place])   . Persistent atrial fibrillation ([place])   . Pulmonary hypertension ([place])     Encounter Details: CNP Questionnaire - 07/10/17 1100      Questionnaire   Patient Status  Not Applicable    Race  Black or African American    Location Patient Served At  [facility]  Not Applicable    Uninsured  Uninsured (NEW 1x/quarter)    Food  No food insecurities    Housing/Utilities  Yes, have permanent housing    Transportation  No transportation needs    Interpersonal Safety  Yes, feel physically and emotionally safe where you currently live    Medication  No medication insecurities    Referrals  [facility];Primary Care Provider/Clinic    ED Visit Averted  Not Applicable    Life-Saving Intervention Made  Not Applicable      New client to [facility]. Client was seen in [facility] ER for a wound on her right ankle see picture in note of 06/27/17 at [facility]. Client sent here for wound check and for referral to Primary Care provider. Explained to client that wound care is not part of our scope of practice, however we would be able to discuss with her options for care without insurance and currently no income. Client is in the process of appealing her disability. She has been denied medicaid. She gets support from her sister. She  is a patient of the CHF clinic in [place] and was last seen 06/27/17. Dr. Haroldine Laws is her cardiologist. The CHF clinic manages all her cardiac and blood pressure medications and assists with those medications per client.  Client wishes to have a general primary care provider to have for general care. She had been seen by Dr. Berdine Addison however has not been seen there in a year since she cannot pay out of pocket.  See list of medications up to date in Epic ER did prescribe Bactrim DS 20 tablets to take twice by mouth daily and Tramadol as needed for pain.  * client states she has taken her antibiotics as directed and only uses pain medicine when necessary.  Allergies: PCN: Immediate Rash  Medical History see Epic list  Client alert and oriented to person place and time. Answers questions appropriately and very involved and knowledgeable of her care and medications. Client states she has had no fevers and no chills. Her right ankle wound is dressed cleanly and she states she has been following directions of cleaning with soapy water and drying then applying wet to dry dressing as shown in ER and wrapped in [place], ER provided her with supplies. Client states it appears to be getting smaller. RN cannot take down dressing today due to no available supplies to redress for  wound management and care. Client understands and is agreeable. Client states there is no drainage from wound. Bilateral lower extremities with darkened pigment and left leg is larger than right. Client reports that she had cellulitis in the past in her left leg. She also has a history of anasarca. Gout, CHF, Hypertension, renal failure stage 3?, ? sleep apnea. Today client denies chest pains or shortness of breath. She does report allergies from pollen. She has a list of medications she is allowed to take now and states she follows that list. She did not weigh herself before coming this morning but states she does. She has not taken her  medications due to she doesn't usually take them until she eats and at 10 am. RN discussed taking her medications as soon as she gets home. She states her blood pressure has been staying up and they are looking into possible reason such as sleep apnea. She had a recent study, however she reports that it may not have recorded enough of her sleep time and may be rescheduled. Today client is positive for murmur her blood pressure is 181/103 and pulse 85. Client denies pain in her foot at present. RN discussed staying off foot as much as possible and elevating foot when at home. Client denies headache, vision changes difficulty with speech. No weakness of extremities and states all symptoms to call 911. Lungs sounds are clear to auscultation.  RN discussed options for referral to primary care without insurance Client states [facility] would be closer for her with borrowing her sisters car. We discussed that Dr. Haroldine Laws and the CHF clinic would continue to manage all her blood pressure and heart medications and care. She understands and is agreeable. Discussed with client about applying for the [facility] application and Yehuda Savannah was notified and will mail application. RN informed client that [facility] will probably also give her an application and to make sure she only fills out one application and take back to [facility] for approval. Client states understanding. Referral made and asked for an ASAP appointment due to concern for wound healing Appointment secured for 07/11/17 at 0945 am. Explained to client what the [facility] assistance would provide if approved and she states understanding. Appointment card with contact information given to client for free Clinic also a summary of visit today with recommendations. No further needs per client at this time.   Will plan to follow up with client after her initial visit with [facility]. Client agreeable to plan of care discussed today. Client  reminded that if she experiences any fever or chills, drainage or redness of wound or increased swelling to seek emergency treatment. Client states understanding.  [place] [phone]

## 2017-07-11 ENCOUNTER — Ambulatory Visit: Payer: Medicaid Other | Admitting: Physician Assistant

## 2017-07-11 ENCOUNTER — Encounter: Payer: Self-pay | Admitting: Physician Assistant

## 2017-07-11 VITALS — BP 160/90 | HR 92 | Temp 98.1°F | Ht 69.25 in | Wt 287.5 lb

## 2017-07-11 DIAGNOSIS — Z7689 Persons encountering health services in other specified circumstances: Secondary | ICD-10-CM

## 2017-07-11 DIAGNOSIS — M109 Gout, unspecified: Secondary | ICD-10-CM

## 2017-07-11 DIAGNOSIS — I10 Essential (primary) hypertension: Secondary | ICD-10-CM

## 2017-07-11 DIAGNOSIS — N189 Chronic kidney disease, unspecified: Secondary | ICD-10-CM

## 2017-07-11 DIAGNOSIS — I5032 Chronic diastolic (congestive) heart failure: Secondary | ICD-10-CM

## 2017-07-11 DIAGNOSIS — R601 Generalized edema: Secondary | ICD-10-CM

## 2017-07-11 DIAGNOSIS — Z7901 Long term (current) use of anticoagulants: Secondary | ICD-10-CM

## 2017-07-11 DIAGNOSIS — Z13220 Encounter for screening for lipoid disorders: Secondary | ICD-10-CM

## 2017-07-11 DIAGNOSIS — I272 Pulmonary hypertension, unspecified: Secondary | ICD-10-CM

## 2017-07-11 DIAGNOSIS — I4891 Unspecified atrial fibrillation: Secondary | ICD-10-CM

## 2017-07-11 DIAGNOSIS — D649 Anemia, unspecified: Secondary | ICD-10-CM

## 2017-07-11 DIAGNOSIS — L97919 Non-pressure chronic ulcer of unspecified part of right lower leg with unspecified severity: Secondary | ICD-10-CM

## 2017-07-11 DIAGNOSIS — Z1239 Encounter for other screening for malignant neoplasm of breast: Secondary | ICD-10-CM

## 2017-07-11 MED ORDER — DICLOFENAC SODIUM 75 MG PO TBEC: 75.0000 mg | DELAYED_RELEASE_TABLET | Freq: Two times a day (BID) | ORAL | 0 refills | Status: DC | PRN

## 2017-07-11 NOTE — Progress Notes (Signed)
BP (!) 160/90 (BP Location: Left Arm, Patient Position: Sitting, Cuff Size: Normal)   Pulse 92   Temp 98.1 F (36.7 C)   Ht 5' 9.25" (1.759 m)   Wt 287 lb 8 oz (130.4 kg)   SpO2 99%   BMI 42.15 kg/m    Subjective:    Patient ID: [name], female    DOB: 1971/01/01, 47 y.o.   MRN: [id number]  HPI: [name] is a 47 y.o. female presenting on 07/11/2017 for New Patient (Initial Visit) (pt is here with boyfriend [place]. pt states she use to go to Dr. Berdine [place]. pt can no longer afford to go.); Ulcer (on R inner ankle. pt states she went to [facility] and was given bactrim. pt states she is almost finished with it.); and Foot Swelling (bilateral)   HPI  Chief Complaint  Patient presents with  . New Patient (Initial Visit)    pt is here with boyfriend [place]. pt states she use to go to Dr. Berdine [place]. pt can no longer afford to go.  Marland Kitchen Ulcer    on R inner ankle. pt states she went to [facility] and was given bactrim. pt states she is almost finished with it.  . Foot Swelling    bilateral    Pleasant lady with complex medical history presents today to establish care.  Pt used to go to Dr Berdine [facility] in [place] but she hasn't been there since september due to no insurance.  Unfortunately she says she has been denied for medicaid.  She says her Last mammogram was at age 6.  And her Last PAP was a long time ago, more than 5 years.  It Was normal.  Pt has history of gout but she says she has had No gout flare in long time.    Pt has chronic atrial fibrillation and is on eliquis.  She also has diastolic heart failure.  She also has HTN and PAH.  She sees cardiology regularly.  She also has CKD but says she has never seen a nephrologist except when she was in the hospital.    Pt states that she is in her usual state of health and her current breathing today is good for her.    Relevant past medical, surgical, family and social history reviewed and updated as indicated. Interim medical history since our last  visit reviewed. Allergies and medications reviewed and updated.   Current Outpatient Medications:  .  amLODipine (NORVASC) 10 MG tablet, Take 1 tablet (10 mg total) by mouth daily., Disp: 30 tablet, Rfl: 2 .  apixaban (ELIQUIS) 5 MG TABS tablet, Take 1 tablet (5 mg total) by mouth 2 (two) times daily., Disp: 180 tablet, Rfl: 3 .  cloNIDine (CATAPRES) 0.3 MG tablet, Take 1 tablet (0.3 mg total) by mouth 2 (two) times daily. May take an additional tab prn for SBP>180, Disp: 60 tablet, Rfl: 2 .  hydrALAZINE (APRESOLINE) 100 MG tablet, Take 1 tablet (100 mg total) by mouth 3 (three) times daily., Disp: 90 tablet, Rfl: 11 .  potassium chloride SA (K-DUR,KLOR-CON) 20 MEQ tablet, Take 1 tablet (20 mEq total) by mouth 2 (two) times daily., Disp: 60 tablet, Rfl: 2 .  spironolactone (ALDACTONE) 25 MG tablet, Take 0.5 tablets (12.5 mg total) by mouth daily., Disp: 15 tablet, Rfl: 3 .  sulfamethoxazole-trimethoprim (BACTRIM DS,SEPTRA DS) 800-160 MG tablet, Take 1 tablet by mouth 2 (two) times daily. For 10 days, Disp: , Rfl:  .  torsemide (DEMADEX) 20 MG  tablet, Take 2 tablets (40 mg total) by mouth 2 (two) times daily., Disp: 120 tablet, Rfl: 2  Review of Systems  Constitutional: Negative for appetite change, chills, diaphoresis, fatigue, fever and unexpected weight change.  HENT: Negative for congestion, dental problem, drooling, ear pain, facial swelling, hearing loss, mouth sores, sneezing, sore throat, trouble swallowing and voice change.   Eyes: Negative for pain, discharge, redness, itching and visual disturbance.  Respiratory: Negative for cough, choking, shortness of breath and wheezing.   Cardiovascular: Positive for leg swelling. Negative for chest pain and palpitations.  Gastrointestinal: Negative for abdominal pain, blood in stool, constipation, diarrhea and vomiting.  Endocrine: Negative for cold intolerance, heat intolerance and polydipsia.  Genitourinary: Negative for decreased urine  volume, dysuria and hematuria.  Musculoskeletal: Negative for arthralgias, back pain and gait problem.  Skin: Negative for rash.  Allergic/Immunologic: Negative for environmental allergies.  Neurological: Negative for seizures, syncope, light-headedness and headaches.  Hematological: Negative for adenopathy.  Psychiatric/Behavioral: Negative for agitation, dysphoric mood and suicidal ideas. The patient is not nervous/anxious.     Per HPI unless specifically indicated above     Objective:    BP (!) 160/90 (BP Location: Left Arm, Patient Position: Sitting, Cuff Size: Normal)   Pulse 92   Temp 98.1 F (36.7 C)   Ht 5' 9.25" (1.759 m)   Wt 287 lb 8 oz (130.4 kg)   SpO2 99%   BMI 42.15 kg/m   Wt Readings from Last 3 Encounters:  07/11/17 287 lb 8 oz (130.4 kg)  07/10/17 285 lb 12.8 oz (129.6 kg)  06/27/17 277 lb (125.6 kg)    Physical Exam  Constitutional: She is oriented to person, place, and time. She appears well-developed and well-nourished.  HENT:  Head: Normocephalic and atraumatic.  Mouth/Throat: Oropharynx is clear and moist. No oropharyngeal exudate.  Eyes: Pupils are equal, round, and reactive to light. Conjunctivae and EOM are normal.  Neck: Neck supple. No thyromegaly present.  Cardiovascular: Normal rate and regular rhythm. Exam reveals no decreased pulses.  Pulmonary/Chest: Effort normal. No respiratory distress. She has no wheezes. She has rales (occassional, soft, dependent).  Abdominal: Soft. Bowel sounds are normal. She exhibits no mass. There is no hepatosplenomegaly. There is no tenderness.  Musculoskeletal: She exhibits edema.       Feet:  Gouty tophi joints of the hands. Approximately 5 cm diameter ulceration R ankle approximately 1 cm in depth.  It appears much as it does in the picture (Mr Maryanna Shape on 06/27/17).  There is some odor. No drainage.  No surrounding redness.   + pulses R foot  Lymphadenopathy:    She has no cervical adenopathy.    Neurological: She is alert and oriented to person, place, and time. Gait normal.  Skin: Skin is warm and dry.  Psychiatric: She has a normal mood and affect. Her behavior is normal.  Vitals reviewed.          Assessment & Plan:   Encounter Diagnoses  Name Primary?  . Encounter to establish care Yes  . Ulcer of right lower extremity, unspecified ulcer stage ([place])   . Screening for breast cancer   . Chronic kidney disease, unspecified CKD stage   . Atrial fibrillation with controlled ventricular response ([place])   . Accelerated hypertension   . Pulmonary hypertension ([place])   . Chronic diastolic heart failure ([place])   . Morbid obesity ([place])   . Anasarca   . Anemia, unspecified type   . Gout, unspecified cause,  unspecified chronicity, unspecified site   . Screening cholesterol level   . Anticoagulant long-term use      Ulcer RLE No improvement with antibiotics and saline dressings.  Referred to wound clinic.  Nurse called and was told that pt will be seen next week.  Wound clinic will call pt to schedule.  Pt to continue current dressings and antibiotics.  Pt requests pain medication.  Pt was given rx for 2 weeks of diclofenac.  Discussed with pt that it can increase her risk of bleeding due to the eliquis.  She is counseled to watch for signs of bleeding.  Pt states understanding.  She is to elevate the extremity while seated.   Pt was given [facility].   Health care maintenance: Ordered screening Mammogram Will update PAP at next OV Will check fasting lipids   CKD- stage 4 Refer to nephrology  Anemia- likely of chronic disease.  Will monitor  Chronic anticoagulation PAH AFib HTN Chronic Diastolic heart failure- discussed with pt that there is a fine balance of medication with her significant medical conditions and that adjustment of these medications would be deferred to her cardiologist who has been treating her for some time.  She is in agreement  with this plan  Pt to follow up in 3-4 weeks.  RTO sooner prn

## 2017-07-15 ENCOUNTER — Encounter (HOSPITAL_COMMUNITY): Payer: Self-pay | Admitting: *Deleted

## 2017-07-15 ENCOUNTER — Other Ambulatory Visit: Payer: Self-pay | Admitting: Physician Assistant

## 2017-07-15 ENCOUNTER — Telehealth: Payer: Self-pay

## 2017-07-15 DIAGNOSIS — Z1231 Encounter for screening mammogram for malignant neoplasm of breast: Secondary | ICD-10-CM

## 2017-07-15 MED FILL — hydrALAZINE HCL 100 MG TABS: 100 | 30 days supply | Qty: 90 | Fill #1

## 2017-07-15 MED FILL — SPIRONOLACTONE 25 MG TABLET: 25 | 30 days supply | Qty: 15 | Fill #2

## 2017-07-15 MED FILL — AMLODIPINE BESYLATE 10 MG T: 10 | 30 days supply | Qty: 30 | Fill #2

## 2017-07-15 MED FILL — cloNIDine HCL 0.3 MG TABS: 0.3 | 30 days supply | Qty: 60 | Fill #2

## 2017-07-15 MED FILL — POTASSIUM CL ER 20 MEQ TABL: 20 | 30 days supply | Qty: 60 | Fill #2

## 2017-07-15 MED FILL — TORSEMIDE 20 MG TABLET: 20 | 30 days supply | Qty: 120 | Fill #2

## 2017-07-15 NOTE — Telephone Encounter (Signed)
Pt was seen at the [facility] on 07/10/17 after going to the ER for a wound on her right ankle. Pt has a complex medical history. RN expalined to the patient that wound care is not part of [name]'s scope of practice, however was able to secure a PCP [facility]17 at 0945 am.   Follow-up call was made to patient today on 06/2017. No answer. Left voicemail.   [name] R. Hope, LPN [phone]

## 2017-07-15 NOTE — Progress Notes (Signed)
Received medical records request from [facility], CASE # [id number].  Requested records faxed today to [phone].  Original request will be scanned to patient's electronic medical record.

## 2017-07-18 ENCOUNTER — Other Ambulatory Visit
Admission: RE | Admit: 2017-07-18 | Discharge: 2017-07-18 | Disposition: A | Discharge status: Home / Self Care | Payer: Medicaid Other | Source: Other Acute Inpatient Hospital | Attending: Nurse Practitioner | Admitting: Nurse Practitioner

## 2017-07-18 ENCOUNTER — Encounter: Payer: Medicaid Other | Attending: Nurse Practitioner | Admitting: Nurse Practitioner

## 2017-07-18 DIAGNOSIS — X58XXXA Exposure to other specified factors, initial encounter: Secondary | ICD-10-CM | POA: Insufficient documentation

## 2017-07-18 DIAGNOSIS — L97312 Non-pressure chronic ulcer of right ankle with fat layer exposed: Secondary | ICD-10-CM | POA: Insufficient documentation

## 2017-07-18 DIAGNOSIS — I890 Lymphedema, not elsewhere classified: Secondary | ICD-10-CM | POA: Insufficient documentation

## 2017-07-18 DIAGNOSIS — I509 Heart failure, unspecified: Secondary | ICD-10-CM | POA: Insufficient documentation

## 2017-07-18 DIAGNOSIS — S80822A Blister (nonthermal), left lower leg, initial encounter: Secondary | ICD-10-CM | POA: Insufficient documentation

## 2017-07-18 DIAGNOSIS — S80821A Blister (nonthermal), right lower leg, initial encounter: Secondary | ICD-10-CM | POA: Insufficient documentation

## 2017-07-18 DIAGNOSIS — I130 Hypertensive heart and chronic kidney disease with heart failure and stage 1 through stage 4 chronic kidney disease, or unspecified chronic kidney disease: Secondary | ICD-10-CM | POA: Insufficient documentation

## 2017-07-18 DIAGNOSIS — N189 Chronic kidney disease, unspecified: Secondary | ICD-10-CM | POA: Insufficient documentation

## 2017-07-18 DIAGNOSIS — B999 Unspecified infectious disease: Secondary | ICD-10-CM | POA: Insufficient documentation

## 2017-07-20 NOTE — Progress Notes (Signed)
[name], [name] ([id number]) Visit Report for 07/18/2017 Allergy List Details Patient Name: [name], [name]. Date of Service: 07/18/2017 8:00 AM Medical Record Number: [id number] Patient Account Number: [id number] Date of Birth/Sex: [birth year] (47 y.o. Female) Treating [name]: [name] Primary Care Lemmie Steinhaus: Soyla Dryer Other Clinician: Referring Dorothea Yow: Soyla Dryer Treating Taraji Mungo/Extender: [name] Weeks in Treatment: 0 Allergies Active Allergies penicillin Allergy Notes Electronic Signature(s) Signed: 07/18/2017 5:31:29 PM By: [name] Entered By: [name] on 07/18/2017 08:17:43 [name], [name] ([id number]) -------------------------------------------------------------------------------- Arrival Information Details Patient Name: [name], [name]. Date of Service: 07/18/2017 8:00 AM Medical Record Number: [id number] Patient Account Number: [id number] Date of Birth/Sex: [birth year] (47 y.o. Female) Treating [name]: [name] Primary Care Oretha Weismann: Soyla Dryer Other Clinician: Referring Candie Gintz: Soyla Dryer Treating Macala Baldonado/Extender: [name] in Treatment: 0 Visit Information Patient Arrived: Ambulatory Arrival Time: 08:14 Accompanied By: spouse Transfer Assistance: None Patient Identification Verified: Yes Secondary Verification Process Yes Completed: Patient Has Alerts: Yes Patient Alerts: Patient on Blood Thinner Eliquis ABI [place] BILATERAL >220 Electronic Signature(s) Signed: 07/18/2017 5:31:29 PM By: [name] Entered By: [name] on 07/18/2017 08:45:39 [name], [name] [name] [name] ([id number]) -------------------------------------------------------------------------------- Encounter Discharge Information Details Patient Name: [name], [name]. Date of Service: 07/18/2017 8:00 AM Medical Record Number: [id number] Patient Account Number: [id number] Date of Birth/Sex: 1970/12/26 (48 y.o. Female) Treating [name]: [name] [name] Primary Care Terez Freimark: Soyla Dryer Other Clinician: Referring Luccas Towell: Soyla Dryer Treating [name] [name]/Extender: [name] in Treatment: 0 Encounter Discharge Information Items Discharge Condition: Stable Ambulatory Status: Ambulatory Discharge Destination: Home Transportation: Private Auto Accompanied By: self Schedule Follow-up Appointment: Yes Clinical Summary of Care: Electronic Signature(s) Signed: 07/18/2017 5:02:07 PM By: [name], BSN, [name], [name], Kim [name], [name] Entered By: [name] on 07/18/2017 09:31:17 [name], [name] ([id number]) -------------------------------------------------------------------------------- Lower Extremity Assessment Details Patient Name: [name], [name]. Date of Service: 07/18/2017 8:00 AM Medical Record Number: [id number] Patient Account Number: [id number] Date of Birth/Sex: 1970-04-08 (47 y.o. Female) Treating [name]: [name] Primary Care Christopherjame Carnell: Soyla Dryer Other Clinician: Referring Vegas Fritze: Soyla Dryer Treating [name] [name]/Extender: [name] in Treatment: 0 Edema Assessment Assessed: [Left: No] [Right: No] Edema: [Left: Yes] [Right: Yes] Calf Left: Right: Point of Measurement: 38 cm From Medial Instep 51.3 cm 45 cm Ankle Left: Right: Point of Measurement: 12 cm From Medial Instep 33.4 cm 28.7 cm Vascular Assessment Pulses: Dorsalis Pedis Palpable: [Left:Yes] [Right:Yes] Doppler Audible: [Left:Yes] [Right:Yes] Posterior Tibial Palpable: [Left:No] [Right:No] Doppler Audible: [Left:Yes] Extremity colors, hair growth, and conditions: Extremity Color: [Left:Normal] [Right:Normal] Hair Growth on Extremity: [Left:No] [Right:No] Temperature of Extremity: [Left:Warm] [Right:Warm] Capillary Refill: [Left:< 3 seconds] [Right:< 3 seconds] Toe Nail Assessment Left: Right: Thick: Yes Yes Discolored: Yes Yes Deformed: No No Improper Length and Hygiene: Yes Yes Notes ABI [place] BILATERAL  >220 Electronic Signature(s) Signed: 07/18/2017 5:31:29 PM By: [name] Entered By: [name] on 07/18/2017 08:45:22 [name], [name] R. ([id number]) -------------------------------------------------------------------------------- Multi Wound Chart Details Patient Name: [name]. Date of Service: 07/18/2017 8:00 AM Medical Record Number: [id number] Patient Account Number: [id number] Date of Birth/Sex: October 16, 1970 (47 y.o. 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 Leg  Ulcer] [2:Venous Leg Ulcer] [N/A:N/A] Comorbid History: [1:Arrhythmia, Congestive Heart Failure, Hypertension, Gout] [2:Arrhythmia, Congestive Heart Failure, Hypertension, Gout] [N/A:N/A] Date Acquired: [1:05/13/2017] [2:07/06/2017] [N/A:N/A] Weeks of Treatment: [1:0] [2:0] [N/A:N/A] Wound Status: [1:Open] [2:Open] [N/A:N/A] Clustered Wound: [1:No] [2:Yes] [N/A:N/A] Clustered Quantity: [1:N/A] [2:3] [N/A:N/A] Measurements L x W x D [1:5.2x4.1x0.6] [2:6.3x8.2x0.1] [N/A:N/A] (cm) Area (cm) : [1:16.745] [2:40.574] [N/A:N/A] Volume (cm) : [1:10.047] [2:4.057] [N/A:N/A] % Reduction in Area: [1:N/A] [2:0.00%] [N/A:N/A] % Reduction in Volume: [1:N/A] [2:0.00%] [N/A:N/A] Classification: [1:Full Thickness Without Exposed Support Structures] [2:Partial Thickness] [N/A:N/A] Exudate Amount: [1:Large] [2:Large] [N/A:N/A] Exudate Type: [1:Serous] [2:Serous] [N/A:N/A] Exudate Color:  [1:amber] [2:amber] [N/A:N/A] Foul Odor After Cleansing: [1:Yes] [2:Yes] [N/A:N/A] Odor Anticipated Due to [1:No] [2:No] [N/A:N/A] Product Use: Wound Margin: [1:Flat and Intact] [2:Flat and Intact] [N/A:N/A] Granulation Amount: [1:None Present (0%)] [2:Large (67-100%)] [N/A:N/A] Granulation Quality: [1:N/A] [2:Pink] [N/A:N/A] Necrotic Amount: [1:Large (67-100%)] [2:None Present (0%)] [N/A:N/A] Necrotic Tissue: [1:Eschar, Adherent Slough] [2:N/A] [N/A:N/A] Exposed Structures: [1:Fat Layer (Subcutaneous Tissue) Exposed: Yes Fascia: No Tendon: No Muscle: No Joint: No Bone: No] [2:Fascia: No Fat Layer (Subcutaneous Tissue) Exposed: No Tendon: No Muscle: No Joint: No Bone: No] [N/A:N/A] Epithelialization: None None N/A Debridement: Debridement - Excisional N/A N/A Pre-procedure 08:54 N/A N/A Verification/Time Out Taken: Pain Control: Other N/A N/A Tissue Debrided: Subcutaneous, Slough N/A N/A Level: Skin/Subcutaneous Tissue N/A N/A Debridement Area (sq cm): 21.32 N/A N/A Instrument: Curette N/A N/A Bleeding: Minimum N/A N/A Hemostasis Achieved: Pressure N/A N/A Procedural Pain: 0 N/A N/A Post Procedural Pain: 0 N/A N/A Debridement Treatment Procedure was tolerated well N/A N/A Response: Post Debridement 5.2x4.1x0.6 N/A N/A Measurements L x W x D (cm) Post Debridement Volume: 10.047 N/A N/A (cm) Periwound Skin Texture: Excoriation: Yes Excoriation: Yes N/A Induration: No Induration: No Callus: No Callus: No Crepitus: No Crepitus: No Rash: No Rash: No Scarring: No Scarring: No Periwound Skin Moisture: Maceration: Yes Maceration: No N/A Dry/Scaly: No Dry/Scaly: No Periwound Skin Color: Atrophie Blanche: No Atrophie Blanche: No N/A Cyanosis: No Cyanosis: No Ecchymosis: No Ecchymosis: No Erythema: No Erythema: No Hemosiderin Staining: No Hemosiderin Staining: No Mottled: No Mottled: No Pallor: No Pallor: No Rubor: No Rubor: No Temperature: No Abnormality No  Abnormality N/A Tenderness on Palpation: Yes Yes N/A Wound Preparation: Ulcer Cleansing: Ulcer Cleansing: N/A Rinsed/Irrigated with Saline Rinsed/Irrigated with Saline Topical Anesthetic Applied: Topical Anesthetic Applied: Other: lidocaine 4% None Procedures Performed: Debridement N/A N/A Treatment Notes Wound #1 (Right, Medial Malleolus) 1. Cleansed with: Clean wound with Normal Saline 2. Anesthetic Topical Lidocaine 4% cream to wound bed prior to debridement 4. Dressing Applied: Other dressing (specify in notes) 5. Secondary Dressing Applied ABD [place] [address] Alanya R. ([id number]) 7. Secured with Other (specify in notes) Notes Dakins on right, Telfa and ace wraps to secure bilateral Wound #2 (Left, Medial Lower Leg) 1. Cleansed with: Clean wound with Normal Saline 2. Anesthetic Topical Lidocaine 4% cream to wound bed prior to debridement 4. Dressing Applied: Other dressing (specify in notes) 5. Secondary Dressing Applied ABD [place] 7. Secured with Other (specify in notes) Notes Dakins on right, Telfa and ace wraps to secure bilateral Electronic Signature(s) Signed: 07/18/2017 9:40:11 AM By: [name] Entered By: [name] on 07/18/2017 09:40:11 [name], SCHEUERMANN ([id number]) -------------------------------------------------------------------------------- [place] Details Patient Name: [name], [name]. Date of Service: 07/18/2017 8:00 AM Medical Record Number: [id number] Patient Account Number: [id number] Date of Birth/Sex: Nov 18, 1970 (47 y.o. Female) Treating [name]: [name] Primary Care Anwen Cannedy: Soyla Dryer Other Clinician: Referring Aasia Peavler: Soyla Dryer  Treating Tishara Pizano/Extender: [name] Weeks in Treatment: 0 Active Inactive ` Orientation to the Wound Care Program Nursing Diagnoses: Knowledge deficit related to the wound healing center program Goals: Patient/caregiver will verbalize  understanding of the [facility] Date Initiated: 07/18/2017 Target Resolution Date: 08/08/2017 Goal Status: Active Interventions: Provide education on orientation to the wound center Notes: ` Wound/Skin Impairment Nursing Diagnoses: Impaired tissue integrity Goals: Patient/caregiver will verbalize understanding of skin care regimen Date Initiated: 07/18/2017 Target Resolution Date: 08/09/2017 Goal Status: Active Ulcer/skin breakdown will have a volume reduction of 30% by week 4 Date Initiated: 07/18/2017 Target Resolution Date: 08/09/2017 Goal Status: Active Interventions: Assess patient/caregiver ability to obtain necessary supplies Assess patient/caregiver ability to perform ulcer/skin care regimen upon admission and as needed Assess ulceration(s) every visit Treatment Activities: Skin care regimen initiated : 07/18/2017 Notes: Electronic Signature(s) Signed: 07/18/2017 4:33:19 PM By: Milda Smart, Sharion R. ([id number]) Entered By: [name] on 07/18/2017 08:53:46 Janney, Leigh R. ([id number]) -------------------------------------------------------------------------------- Non-Wound Condition Assessment Details Patient Name: [name]. Date of Service: 07/18/2017 8:00 AM Medical Record Number: [id number] Patient Account Number: [id number] Date of Birth/Sex: [birth year] (47 y.o. Female) Treating [name]: [name] Primary Care Dyna Figuereo: Soyla Dryer Other Clinician: Referring Mellonie Guess: Soyla Dryer Treating Yariah Selvey/Extender: [name] in Treatment: 0 Non-Wound Condition: Condition: Other Dermatologic Condition Location: Leg Side: Bilateral Periwound Skin Texture Texture Color No Abnormalities Noted: No No Abnormalities Noted: No Moisture No Abnormalities Noted: No Notes patient with multiple blisters on BLE Electronic Signature(s) Signed: 07/18/2017 5:31:29 PM By: [name] Entered By: [name] on  07/18/2017 08:27:54 Guerrier, Rhilynn R. ([id number]) -------------------------------------------------------------------------------- Pain Assessment Details Patient Name: [name], [name]. Date of Service: 07/18/2017 8:00 AM Medical Record Number: [id number] Patient Account Number: [id number] Date of Birth/Sex: 27-Jul-1970 (47 y.o. Female) Treating [name]: [name] Primary Care Undrea Shipes: Soyla Dryer Other Clinician: Referring Fatiha Guzy: Soyla Dryer Treating Kristofor Michalowski/Extender: [name] in Treatment: 0 Active Problems Location of Pain Severity and Description of Pain Patient Has Paino Yes Site Locations Pain Location: Pain in Ulcers With Dressing Change: Yes Duration of the Pain. Constant / Intermittento Intermittent Pain Management and Medication Current Pain Management: Electronic Signature(s) Signed: 07/18/2017 5:31:29 PM By: [name] Entered By: [name] on 07/18/2017 08:16:04 [name], [name] ([id number]) -------------------------------------------------------------------------------- Patient/Caregiver Education Details Patient Name: [name]. Date of Service: 07/18/2017 8:00 AM Medical Record Number: [id number] Patient Account Number: [id number] Date of Birth/Gender: 02/13/1971 (47 y.o. Female) Treating [name]: [name] [name] Primary Care Physician: Soyla Dryer Other Clinician: Referring Physician: Soyla Dryer Treating Physician/Extender: [name] in Treatment: 0 Education Assessment Education Provided To: Patient Education Topics Provided Welcome To [facility]: Handouts: Welcome To [facility] Methods: Demonstration, Explain/Verbal Responses: State content correctly Wound/Skin Impairment: Handouts: Caring for Your Ulcer Methods: Demonstration, Explain/Verbal Responses: State content correctly Electronic Signature(s) Signed: 07/18/2017 5:02:07 PM By: [name], BSN, [name], [name], Kim [name], [name] Entered By:  [name] on 07/18/2017 09:31:45 ATHEA, HALEY ([id number]) -------------------------------------------------------------------------------- Wound Assessment Details Patient Name: [name], [name]. Date of Service: 07/18/2017 8:00 AM Medical Record Number: [id number] Patient Account Number: [id number] Date of Birth/Sex: [birth year] (47 y.o. Female) Treating [name]: [name] Primary Care Adah Stoneberg: Soyla Dryer Other Clinician: Referring Raziyah Vanvleck: Soyla Dryer Treating Dorthia Tout/Extender: [name] in Treatment: 0 Wound Status Wound Number: 1 Primary Venous Leg Ulcer Etiology: Wound Location: Right Malleolus - Medial Wound Status: Open Wounding Event: Trauma Comorbid Arrhythmia, Congestive Heart Failure, Date Acquired: 05/13/2017 History: Hypertension,  Gout Weeks Of Treatment: 0 Clustered Wound: No Photos Photo Uploaded By: [name] on 07/18/2017 14:11:54 Wound Measurements Length: (cm) 5.2 Width: (cm) 4.1 Depth: (cm) 0.6 Area: (cm) 16.745 Volume: (cm) 10.047 % Reduction in Area: % Reduction in Volume: Epithelialization: None Tunneling: No Undermining: No Wound Description Full Thickness Without Exposed Support Classification: Structures Wound Margin: Flat and Intact Exudate Large Amount: Exudate Type: Serous Exudate Color: amber Foul Odor After Cleansing: Yes Due to Product Use: No Slough/Fibrino Yes Wound Bed Granulation Amount: None Present (0%) Exposed Structure Necrotic Amount: Large (67-100%) Fascia Exposed: No Necrotic Quality: Eschar, Adherent Slough Fat Layer (Subcutaneous Tissue) Exposed: Yes Tendon Exposed: No Muscle Exposed: No Joint Exposed: No Bone Exposed: No Stairs, Itzelle R. ([id number]) Periwound Skin Texture Texture Color No Abnormalities Noted: No No Abnormalities Noted: No Callus: No Atrophie Blanche: No Crepitus: No Cyanosis: No Excoriation: Yes Ecchymosis: No Induration: No Erythema:  No Rash: No Hemosiderin Staining: No Scarring: No Mottled: No Pallor: No Moisture Rubor: No No Abnormalities Noted: No Dry / Scaly: No Temperature / Pain Maceration: Yes Temperature: No Abnormality Tenderness on Palpation: Yes Wound Preparation Ulcer Cleansing: Rinsed/Irrigated with Saline Topical Anesthetic Applied: Other: lidocaine 4%, Treatment Notes Wound #1 (Right, Medial Malleolus) 1. Cleansed with: Clean wound with Normal Saline 2. Anesthetic Topical Lidocaine 4% cream to wound bed prior to debridement 4. Dressing Applied: Other dressing (specify in notes) 5. Secondary Dressing Applied ABD [place] 7. Secured with Other (specify in notes) Notes Dakins on right, Telfa and ace wraps to secure bilateral Electronic Signature(s) Signed: 07/18/2017 5:31:29 PM By: [name] Entered By: [name] on 07/18/2017 08:32:33 [name], [name] ([id number]) -------------------------------------------------------------------------------- Wound Assessment Details Patient Name: [name], [name]. Date of Service: 07/18/2017 8:00 AM Medical Record Number: [id number] Patient Account Number: [id number] Date of Birth/Sex: 10/20/1970 (47 y.o. Female) Treating [name]: [name] Primary Care Nahum Sherrer: Soyla Dryer Other Clinician: Referring Johnny Latu: Soyla Dryer Treating Casmere Hollenbeck/Extender: [name] in Treatment: 0 Wound Status Wound Number: 2 Primary Venous Leg Ulcer Etiology: Wound Location: Left Lower Leg - Medial Wound Status: Open Wounding Event: Blister Comorbid Arrhythmia, Congestive Heart Failure, Date Acquired: 07/06/2017 History: Hypertension, Gout Weeks Of Treatment: 0 Clustered Wound: Yes Photos Photo Uploaded By: [name] on 07/18/2017 14:11:55 Wound Measurements Length: (cm) 6.3 Width: (cm) 8.2 Depth: (cm) 0.1 Clustered Quantity: 3 Area: (cm) 40.574 Volume: (cm) 4.057 % Reduction in Area: 0% % Reduction in  Volume: 0% Epithelialization: None Tunneling: No Undermining: No Wound Description Classification: Partial Thickness Wound Margin: Flat and Intact Exudate Amount: Large Exudate Type: Serous Exudate Color: amber Foul Odor After Cleansing: Yes Due to Product Use: No Slough/Fibrino No Wound Bed Granulation Amount: Large (67-100%) Exposed Structure Granulation Quality: Pink Fascia Exposed: No Necrotic Amount: None Present (0%) Fat Layer (Subcutaneous Tissue) Exposed: No Tendon Exposed: No Muscle Exposed: No Joint Exposed: No Bone Exposed: No Periwound Skin Texture Kressin, Rameen R. ([id number]) Texture Color No Abnormalities Noted: No No Abnormalities Noted: No Callus: No Atrophie Blanche: No Crepitus: No Cyanosis: No Excoriation: Yes Ecchymosis: No Induration: No Erythema: No Rash: No Hemosiderin Staining: No Scarring: No Mottled: No Pallor: No Moisture Rubor: No No Abnormalities Noted: No Dry / Scaly: No Temperature / Pain Maceration: No Temperature: No Abnormality Tenderness on Palpation: Yes Wound Preparation Ulcer Cleansing: Rinsed/Irrigated with Saline Topical Anesthetic Applied: None Treatment Notes Wound #2 (Left, Medial Lower Leg) 1. Cleansed with: Clean wound with Normal Saline 2. Anesthetic Topical Lidocaine 4% cream to wound bed prior to debridement  4. Dressing Applied: Other dressing (specify in notes) 5. Secondary Dressing Applied ABD [place] 7. Secured with Other (specify in notes) Notes Dakins on right, Telfa and ace wraps to secure bilateral Electronic Signature(s) Signed: 07/18/2017 5:31:29 PM By: [name] Entered By: [name] on 07/18/2017 08:34:10 KYMBERLEE, VIGER ([id number]) -------------------------------------------------------------------------------- [place] Details Patient Name: [name]. Date of Service: 07/18/2017 8:00 AM Medical Record Number: [id number] Patient Account Number: [id number] Date  of Birth/Sex: 04/18/1970 (47 y.o. Female) Treating [name]: [name] Primary Care Raziel Koenigs: Soyla Dryer Other Clinician: Referring Emran Molzahn: Soyla Dryer Treating Fallynn Gravett/Extender: [name] in Treatment: 0 Vital Signs Time Taken: 08:16 Temperature (F): 98.1 Height (in): 70 Pulse (bpm): 74 Source: Measured Respiratory Rate (breaths/min): 18 Weight (lbs): 287 Blood Pressure (mmHg): 156/99 Source: Measured Reference Range: 80 - 120 mg / dl Body Mass Index (BMI): 41.2 Electronic Signature(s) Signed: 07/18/2017 5:31:29 PM By: [name] Entered By: [name] on 07/18/2017 08:17:19

## 2017-07-20 NOTE — Progress Notes (Signed)
NONA, GRACEY ([id number]) Visit Report for 07/18/2017 Abuse/Suicide Risk Screen Details Patient Name: [name], [name]. Date of Service: 07/18/2017 8:00 AM Medical Record Number: [id number] Patient Account Number: [id number] Date of Birth/Sex: [birth year] (47 y.o. Female) Treating RN: Montey Hora Primary Care Clarinda Obi: Soyla Dryer Other Clinician: Referring Lashonne Shull: Soyla Dryer Treating Zeno Hickel/Extender: Cathie Olden in Treatment: 0 Abuse/Suicide Risk Screen Items Answer ABUSE/SUICIDE RISK SCREEN: Has anyone close to you tried to hurt or harm you recentlyo No Do you feel uncomfortable with anyone in your familyo No Has anyone forced you do things that you didnot want to doo No Do you have any thoughts of harming yourselfo No Patient displays signs or symptoms of abuse and/or neglect. No Electronic Signature(s) Signed: 07/18/2017 5:31:29 PM By: Montey Hora Entered By: Montey Hora on 07/18/2017 08:18:09 [name], [name] ([id number]) -------------------------------------------------------------------------------- Activities of Daily Living Details Patient Name: [name], [name]. Date of Service: 07/18/2017 8:00 AM Medical Record Number: [id number] Patient Account Number: [id number] Date of Birth/Sex: Oct 26, 1970 (47 y.o. Female) Treating RN: Montey Hora Primary Care Cashae Weich: Soyla Dryer Other Clinician: Referring Aleira Deiter: Soyla Dryer Treating Salvatore Poe/Extender: Cathie Olden in Treatment: 0 Activities of Daily Living Items Answer Activities of Daily Living (Please select one for each item) Drive Automobile Completely Able Take Medications Completely Able Use Telephone Completely Able Care for Appearance Completely Able Use Toilet Completely Able Bath / Shower Completely Able Dress Self Completely Able Feed Self Completely Able Walk Completely Able Get In / Out Bed Completely Able Housework Completely Able Prepare Meals  Completely [place] for Self Completely Able Electronic Signature(s) Signed: 07/18/2017 5:31:29 PM By: Montey Hora Entered By: Montey Hora on 07/18/2017 08:18:27 [name], [name] ([id number]) -------------------------------------------------------------------------------- Education Assessment Details Patient Name: [name]. Date of Service: 07/18/2017 8:00 AM Medical Record Number: [id number] Patient Account Number: [id number] Date of Birth/Sex: Jul 05, 1970 (47 y.o. Female) Treating RN: Montey Hora Primary Care Kerri-Anne Haeberle: Soyla Dryer Other Clinician: Referring Susan Arana: Soyla Dryer Treating [employer] Mcjunkins/Extender: Cathie Olden in Treatment: 0 Primary Learner Assessed: Patient Learning Preferences/Education Level/Primary Language Learning Preference: Explanation, Demonstration Highest Education Level: High School Preferred Language: English Cognitive Barrier Assessment/Beliefs Language Barrier: No Translator Needed: No Memory Deficit: No Emotional Barrier: No Cultural/Religious Beliefs Affecting Medical Care: No Physical Barrier Assessment Impaired Vision: No Impaired Hearing: No Decreased Hand dexterity: No Knowledge/Comprehension Assessment Knowledge Level: Medium Comprehension Level: Medium Ability to understand written Medium instructions: Ability to understand verbal Medium instructions: Motivation Assessment Anxiety Level: Calm Cooperation: Cooperative Education Importance: Acknowledges Need Interest in Health Problems: Asks Questions Perception: Coherent Willingness to Engage in Self- Medium Management Activities: Readiness to Engage in Self- Medium Management Activities: Electronic Signature(s) Signed: 07/18/2017 5:31:29 PM By: Montey Hora Entered By: Montey Hora on 07/18/2017 08:18:47 [name], [name]  ([id number]) -------------------------------------------------------------------------------- Fall Risk Assessment Details Patient Name: [name]. Date of Service: 07/18/2017 8:00 AM Medical Record Number: [id number] Patient Account Number: [id number] Date of Birth/Sex: 21-Nov-1970 (47 y.o. Female) Treating RN: Montey Hora Primary Care Kaylor Maiers: Soyla Dryer Other Clinician: Referring Tatia Petrucci: Soyla Dryer Treating Diera Wirkkala/Extender: Cathie Olden in Treatment: 0 Fall Risk Assessment Items Have you had 2 or more falls in the last 12 monthso 0 No Have you had any fall that resulted in injury in the last 12 monthso 0 No FALL RISK ASSESSMENT: History of falling - immediate or within 3 months 0 No Secondary diagnosis 0 No Ambulatory aid None/bed rest/wheelchair/nurse 0 Yes Crutches/cane/walker 0 No Furniture 0 No IV Access/Saline Lock  0 No Gait/Training Normal/bed rest/immobile 0 No Weak 10 Yes Impaired 0 No Mental Status Oriented to own ability 0 Yes Electronic Signature(s) Signed: 07/18/2017 5:31:29 PM By: Montey Hora Entered By: Montey Hora on 07/18/2017 08:18:57 [name], [name] ([id number]) -------------------------------------------------------------------------------- Foot Assessment Details Patient Name: [name], [name]. Date of Service: 07/18/2017 8:00 AM Medical Record Number: [id number] Patient Account Number: [id number] Date of Birth/Sex: 1970/04/21 (47 y.o. Female) Treating RN: Montey Hora Primary Care Abdiaziz Klahn: Soyla Dryer Other Clinician: Referring Niomi Valent: Soyla Dryer Treating Margarie Mcguirt/Extender: Cathie Olden in Treatment: 0 Foot Assessment Items Site Locations + = Sensation present, - = Sensation absent, C = Callus, U = Ulcer R = Redness, W = Warmth, M = Maceration, PU = Pre-ulcerative lesion F = Fissure, S = Swelling, D = Dryness Assessment Right: Left: Other Deformity: No No Prior Foot Ulcer: No No Prior  Amputation: No No Charcot Joint: No No Ambulatory Status: Ambulatory Without Help Gait: Steady Electronic Signature(s) Signed: 07/18/2017 5:31:29 PM By: Montey Hora Entered By: Montey Hora on 07/18/2017 08:19:23 [name], [name] ([id number]) -------------------------------------------------------------------------------- Nutrition Risk Assessment Details Patient Name: [name]. Date of Service: 07/18/2017 8:00 AM Medical Record Number: [id number] Patient Account Number: [id number] Date of Birth/Sex: Dec 01, 1970 (47 y.o. Female) Treating RN: Montey Hora Primary Care Terek Bee: Soyla Dryer Other Clinician: Referring Bard Haupert: Soyla Dryer Treating Chealsea Paske/Extender: Cathie Olden in Treatment: 0 Height (in): 70 Weight (lbs): 287 Body Mass Index (BMI): 41.2 Nutrition Risk Assessment Items NUTRITION RISK SCREEN: I have an illness or condition that made me change the kind and/or amount of 0 No food I eat I eat fewer than two meals per day 0 No I eat few fruits and vegetables, or milk products 0 No I have three or more drinks of beer, liquor or wine almost every day 0 No I have tooth or mouth problems that make it hard for me to eat 0 No I don't always have enough money to buy the food I need 0 No I eat alone most of the time 0 No I take three or more different prescribed or over-the-counter drugs a day 1 Yes Without wanting to, I have lost or gained 10 pounds in the last six months 0 No I am not always physically able to shop, cook and/or feed myself 0 No Nutrition Protocols Good Risk Protocol 0 No interventions needed Moderate Risk Protocol Electronic Signature(s) Signed: 07/18/2017 5:31:29 PM By: Montey Hora Entered By: Montey Hora on 07/18/2017 08:19:03

## 2017-07-20 NOTE — Progress Notes (Signed)
MADDYSON, KEIL ([id number]) Visit Report for 07/18/2017 Chief Complaint Document Details Patient Name: [name], [name]. Date of Service: 07/18/2017 8:00 AM Medical Record Number: [id number] Patient Account Number: [id number] Date of Birth/Sex: Dec 27, 1970 (47 y.o. Female) Treating RN: Roger Shelter Primary Care Provider: Soyla Dryer Other Clinician: Referring Provider: Soyla Dryer Treating Provider/Extender: Cathie Olden in Treatment: 0 Information Obtained from: Patient Chief Complaint She is here for right medial malleolus ulcer and BLE edema with blisters Electronic Signature(s) Signed: 07/18/2017 9:41:14 AM By: Lawanda Cousins Entered By: Lawanda Cousins on 07/18/2017 09:41:14 Caamano, Elliemae Alfonso Patten ([id number]) -------------------------------------------------------------------------------- Debridement Details Patient Name: [name]. Date of Service: 07/18/2017 8:00 AM Medical Record Number: [id number] Patient Account Number: [id number] Date of Birth/Sex: [birth year] (47 y.o. Female) Treating RN: Roger Shelter Primary Care Provider: Soyla Dryer Other Clinician: Referring Provider: Soyla Dryer Treating Provider/Extender: Cathie Olden in Treatment: 0 Debridement Performed for Wound #1 Right,Medial Malleolus Assessment: Performed By: Physician Lawanda Cousins, NP Debridement Type: Debridement Severity of Tissue Pre Fat layer exposed Debridement: Pre-procedure Verification/Time Yes - 08:54 Out Taken: Start Time: 08:54 Pain Control: Other : lidocaine 4% Total Area Debrided (L x W): 5.2 (cm) x 4.1 (cm) = 21.32 (cm) Tissue and other material Non-Viable, Slough, Subcutaneous, Slough debrided: Level: Skin/Subcutaneous Tissue Debridement Description: Excisional Instrument: Curette Bleeding: Minimum Hemostasis Achieved: Pressure End Time: 08:55 Procedural Pain: 0 Post Procedural Pain: 0 Response to Treatment: Procedure was tolerated  well Level of Consciousness: Awake and Alert Post Procedure Vitals: Temperature: 98.1 Pulse: 74 Respiratory Rate: 18 Blood Pressure: Systolic Blood Pressure: 203 Diastolic Blood Pressure: 99 Post Debridement Measurements of Total Wound Length: (cm) 5.2 Width: (cm) 4.1 Depth: (cm) 0.6 Volume: (cm) 10.047 Character of Wound/Ulcer Post Debridement: Stable Severity of Tissue Post Debridement: Fat layer exposed Post Procedure Diagnosis Same as Pre-procedure Electronic Signature(s) Signed: 07/18/2017 9:40:48 AM By: Lawanda Cousins Signed: 07/18/2017 4:33:19 PM By: Milda Smart, Shinita R. ([id number]) Entered By: Lawanda Cousins on 07/18/2017 09:40:48 SALAM, CHESTERFIELD ([id number]) -------------------------------------------------------------------------------- HPI Details Patient Name: [name]. Date of Service: 07/18/2017 8:00 AM Medical Record Number: [id number] Patient Account Number: [id number] Date of Birth/Sex: 09-05-1970 (47 y.o. Female) Treating RN: Roger Shelter Primary Care Provider: Soyla Dryer Other Clinician: Referring Provider: Soyla Dryer Treating Provider/Extender: Cathie Olden in Treatment: 0 History of Present Illness HPI Description: 07/18/17-She is here for initial evaluation of a right medial malleolus ulcer and bilateral lower extremity edema with blisters. She is nondiabetic, she denies remote or present history of smoking. She does see cardiology for congestive heart failure. She has chronic bilateral lower extremity edema, left greater than right. She states the ulcer to the right medial malleolus has been present for approximately 2-3 months, she has gone to the free health clinic in [place] and seen her cardiologist. She was on Bactrim o1 dose and has completed; has been applying saline wet-to-dry dressings to the medial malleolus ulcer. She has not worn compression therapy and states she has never been instructed to  wear compression therapy. It appears she has a component of lymphedema. She is uninsured and obtaining prescriptions and products will be difficult with the circumstances. She was instructed to contact her primary regarding pain medication. she last saw her cardiologist on 5/2, at which time she was advised to go to the emergency department. She had a plain film x-ray of her right ankle which was negative for osteomyelitis at that time. Electronic Signature(s) Signed: 07/18/2017 9:58:29 AM By: Lawanda Cousins Previous Signature: 07/18/2017 9:47:33 AM  Version By: Lawanda Cousins Entered By: Lawanda Cousins on 07/18/2017 09:58:29 [name] ([id number]) -------------------------------------------------------------------------------- Physical Exam Details Patient Name: KEWANA, SANON. Date of Service: 07/18/2017 8:00 AM Medical Record Number: [id number] Patient Account Number: [id number] Date of Birth/Sex: 1971/02/13 (47 y.o. Female) Treating RN: Roger Shelter Primary Care Provider: Soyla Dryer Other Clinician: Referring Provider: Soyla Dryer Treating Provider/Extender: Cathie Olden in Treatment: 0 Respiratory respirations are even and unlabored. clear throughout. Cardiovascular s1 s2 regular rate and rhythm. BLE-palpable DP. BLE- lymphedema, fibrotic skin changes, scattered blisters with partial thickness tissue loss. Musculoskeletal ambulates with no assistive devices. Psychiatric appears to have poor insight and/or judgement. oriented x4. calm, cooperative. Electronic Signature(s) Signed: 07/18/2017 9:54:37 AM By: Lawanda Cousins Entered By: Lawanda Cousins on 07/18/2017 09:54:36 JOLYNN, BAJOREK ([id number]) -------------------------------------------------------------------------------- Physician Orders Details Patient Name: CARISSA, MUSICK. Date of Service: 07/18/2017 8:00 AM Medical Record Number: [id number] Patient Account Number: [id number] Date of Birth/Sex:  1970/11/26 (47 y.o. Female) Treating RN: Roger Shelter Primary Care Provider: Soyla Dryer Other Clinician: Referring Provider: Soyla Dryer Treating Provider/Extender: Cathie Olden in Treatment: 0 Verbal / Phone Orders: No Diagnosis Coding Wound Cleansing Wound #1 Right,Medial Malleolus o Clean wound with Normal Saline. Wound #2 Left,Medial Lower Leg o Clean wound with Normal Saline. Anesthetic (add to Medication List) Wound #2 Left,Medial Lower Leg o Topical Lidocaine 4% cream applied to wound bed prior to debridement (In Clinic Only). Primary Wound Dressing Wound #1 Right,Medial Malleolus o Other: - dakins soaked gauze with abd and ace wrap Wound #2 Left,Medial Lower Leg o Other: - Vaseline gauze Secondary Dressing Wound #1 Right,Medial Malleolus o ABD pad Wound #2 Left,Medial Lower Leg o ABD pad Dressing Change Frequency Wound #1 Right,Medial Malleolus o Change dressing every day. Wound #2 Left,Medial Lower Leg o Change dressing every day. Follow-up Appointments Wound #1 Right,Medial Malleolus o Return Appointment in 1 week. Wound #2 Left,Medial Lower Leg o Return Appointment in 1 week. Additional Orders / Instructions Wound #1 Right,Medial Malleolus o Vitamin A; Vitamin C, Zinc Hajduk, Francille R. ([id number]) o Increase protein intake. Wound #2 Left,Medial Lower Leg o Vitamin A; Vitamin C, Zinc o Increase protein intake. Laboratory o Bacteria identified in Wound by Culture (MICRO) - right malleolus oooo LOINC Code: 6063-0 oooo Convenience Name: Wound culture routine Patient Medications Allergies: penicillin Notifications Medication Indication Start End Dakin's Solution 07/19/2017 DOSE miscellaneous 0.25 % solution - solution miscellaneous Electronic Signature(s) Signed: 07/18/2017 12:15:14 PM By: Lawanda Cousins Previous Signature: 07/18/2017 9:06:59 AM Version By: Lawanda Cousins Entered By: Lawanda Cousins on  07/18/2017 09:55:16 Heinrichs, Tani Alfonso Patten ([id number]) -------------------------------------------------------------------------------- Problem List Details Patient Name: LOUINE, TENPENNY. Date of Service: 07/18/2017 8:00 AM Medical Record Number: [id number] Patient Account Number: [id number] Date of Birth/Sex: Jul 13, 1970 (47 y.o. 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 Date of Service: 07/18/2017 8:00 AM Medical Record Number: [id number] Patient Account  Number: [id number] Date of Birth/Sex: 1970/07/20 (47 y.o. Female) Treating RN: Roger Shelter Primary Care Provider: Soyla Dryer Other Clinician: Referring Provider: Soyla Dryer Treating Provider/Extender: Cathie Olden in Treatment: 0 Subjective Chief Complaint Information obtained from Patient She is here for right medial malleolus ulcer and BLE edema with blisters History of Present Illness (HPI) 07/18/17-She is here for initial evaluation of a right medial malleolus ulcer and bilateral lower extremity  edema with blisters. She is nondiabetic, she denies remote or present history of smoking. She does see cardiology for congestive heart failure. She has chronic bilateral lower extremity edema, left greater than right. She states the ulcer to the right medial malleolus has been present for approximately 2-3 months, she has gone to the free health clinic in [place] and seen her cardiologist. She was on Bactrim o1 dose and has completed; has been applying saline wet-to-dry dressings to the medial malleolus ulcer. She has not worn compression therapy and states she has never been instructed to wear compression therapy. It appears she has a component of lymphedema. She is uninsured and obtaining prescriptions and products will be difficult with the circumstances. She was instructed to contact her primary regarding pain medication. she last saw her cardiologist on 5/2, at which time she was advised to go to the emergency department. She had a plain film x-ray of her right ankle which was negative for osteomyelitis at that time. Wound History Patient presents with 1 open wound that has been present for approximately 2 months. Patient has been treating wound in the following manner: wet to dry. Laboratory tests have not been performed in the last month. Patient reportedly has not tested positive for an antibiotic resistant organism. Patient reportedly has not tested positive for osteomyelitis. Patient reportedly has not had testing performed to evaluate circulation in the legs. Patient History Information obtained from Patient. Allergies penicillin Family History Cancer - Siblings, Diabetes - Mother, Heart Disease - Mother, Hypertension - Mother, Stroke - Mother, No family history of Hereditary Spherocytosis, Kidney Disease, Lung Disease, Seizures, Thyroid Problems, Tuberculosis. Social History Never smoker, Marital Status - Single, Alcohol Use - Never, Drug Use - No History, Caffeine Use -  Rarely. Medical History Eyes Denies history of Cataracts, Glaucoma, Optic Neuritis Ear/Nose/Mouth/Throat Denies history of Chronic sinus problems/congestion, Middle ear problems Hematologic/Lymphatic Denies history of Anemia, Hemophilia, Human Immunodeficiency Virus, Lymphedema, Sickle Cell Disease Respiratory Denies history of Aspiration, Asthma, Chronic Obstructive Pulmonary Disease (COPD), Pneumothorax, Sleep Apnea, Shadden, Jalayah R. ([id number]) Tuberculosis Cardiovascular Patient has history of Arrhythmia - a fib, Congestive Heart Failure, Hypertension Denies history of Angina, Coronary Artery Disease, Deep Vein Thrombosis, Hypotension, Myocardial Infarction, Peripheral Arterial Disease, Peripheral Venous Disease, Phlebitis, Vasculitis Gastrointestinal Denies history of Cirrhosis , Colitis, Crohn s, Hepatitis A, Hepatitis B, Hepatitis C Endocrine Denies history of Type I Diabetes, Type II Diabetes Genitourinary Denies history of End Stage Renal Disease Immunological Denies history of Lupus Erythematosus, Raynaud s, Scleroderma Integumentary (Skin) Denies history of History of Burn, History of pressure wounds Musculoskeletal Patient has history of Gout Denies history of Rheumatoid Arthritis, Osteoarthritis, Osteomyelitis Neurologic Denies history of Dementia, Neuropathy, Quadriplegia, Paraplegia, Seizure Disorder Oncologic Denies history of Received Chemotherapy, Received Radiation Psychiatric Denies history of Anorexia/bulimia, Confinement Anxiety Medical And Surgical History Notes Genitourinary CKD Review of Systems (ROS) Constitutional Symptoms (General Health) The patient has no complaints or symptoms. Eyes The patient has no complaints or symptoms. Ear/Nose/Mouth/Throat Denies complaints or symptoms of Difficult clearing ears, Sinusitis. Hematologic/Lymphatic The patient has no complaints or  symptoms. Respiratory The patient has no complaints or  symptoms. Cardiovascular Complains or has symptoms of LE edema. Denies complaints or symptoms of Chest pain. Gastrointestinal The patient has no complaints or symptoms. Endocrine The patient has no complaints or symptoms. Genitourinary Denies complaints or symptoms of Kidney failure/ Dialysis, Incontinence/dribbling. Immunological Denies complaints or symptoms of Hives, Itching. Integumentary (Skin) Complains or has symptoms of Wounds. Denies complaints or symptoms of Bleeding or bruising tendency, Breakdown, Swelling. Musculoskeletal Denies complaints or symptoms of Muscle Pain, Muscle Weakness. Neurologic Denies complaints or symptoms of Numbness/parasthesias, Focal/Weakness. Psychiatric Denies complaints or symptoms of Anxiety, Claustrophobia. THEOLA, CUELLAR ([id number]) Objective Constitutional Vitals Time Taken: 8:16 AM, Height: 70 in, Source: Measured, Weight: 287 lbs, Source: Measured, BMI: 41.2, Temperature: 98.1 F, Pulse: 74 bpm, Respiratory Rate: 18 breaths/min, Blood Pressure: 156/99 mmHg. Respiratory respirations are even and unlabored. clear throughout. Cardiovascular s1 s2 regular rate and rhythm. BLE-palpable DP. BLE- lymphedema, fibrotic skin changes, scattered blisters with partial thickness tissue loss. Musculoskeletal ambulates with no assistive devices. Psychiatric appears to have poor insight and/or judgement. oriented x4. calm, cooperative. Integumentary (Hair, Skin) Wound #1 status is Open. Original cause of wound was Trauma. The wound is located on the Right,Medial Malleolus. The wound measures 5.2cm length x 4.1cm width x 0.6cm depth; 16.745cm^2 area and 10.047cm^3 volume. There is Fat Layer (Subcutaneous Tissue) Exposed exposed. There is no tunneling or undermining noted. There is a large amount of serous drainage noted. Foul odor after cleansing was noted. The wound margin is flat and intact. There is no granulation within the wound bed. There is  a large (67-100%) amount of necrotic tissue within the wound bed including Eschar and Adherent Slough. The periwound skin appearance exhibited: Excoriation, Maceration. The periwound skin appearance did not exhibit: Callus, Crepitus, Induration, Rash, Scarring, Dry/Scaly, Atrophie Blanche, Cyanosis, Ecchymosis, Hemosiderin Staining, Mottled, Pallor, Rubor, Erythema. Periwound temperature was noted as No Abnormality. The periwound has tenderness on palpation. Wound #2 status is Open. Original cause of wound was Blister. The wound is located on the Left,Medial Lower Leg. The wound measures 6.3cm length x 8.2cm width x 0.1cm depth; 40.574cm^2 area and 4.057cm^3 volume. There is no tunneling or undermining noted. There is a large amount of serous drainage noted. Foul odor after cleansing was noted. The wound margin is flat and intact. There is large (67-100%) pink granulation within the wound bed. There is no necrotic tissue within the wound bed. The periwound skin appearance exhibited: Excoriation. The periwound skin appearance did not exhibit: Callus, Crepitus, Induration, Rash, Scarring, Dry/Scaly, Maceration, Atrophie Blanche, Cyanosis, Ecchymosis, Hemosiderin Staining, Mottled, Pallor, Rubor, Erythema. Periwound temperature was noted as No Abnormality. The periwound has tenderness on palpation. 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 S80.822S - Blister (nonthermal), left lower leg, sequela S80.821S - Blister (nonthermal), right lower leg, sequela -she has been advised to keep BLE elevated throughout the day/all times when seated -we discussed the possible financial burden and difficulty of obtaining supplies, medications, etc -I expect that withint the next 1-2 weeks we will be able to apply compression weekly to the LLE -she states she has  family/friend asistance and will be able to perform dressing changes Procedures Wound #1 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 There was a Excisional Skin/Subcutaneous Tissue Debridement with a total  area of 21.32 sq cm performed by Lawanda Cousins, NP. With the following instrument(s): Curette to remove Non-Viable tissue/material. Material removed includes Subcutaneous Tissue and Slough and after achieving pain control using Other (lidocaine 4%). No specimens were taken. A time out was conducted at 08:54, prior to the start of the procedure. A Minimum amount of bleeding was controlled with Pressure. The procedure was tolerated well with a pain level of 0 throughout and a pain level of 0 following the procedure. Patient s Level of Consciousness post procedure was recorded as Awake and Alert and post- procedure vitals were taken including Temperature: 98.1 F, Pulse: 74 bpm, Respiratory Rate: 18 breaths/min, Blood Pressure: (156)/(99) mmHg. Post Debridement Measurements: 5.2cm length x 4.1cm width x 0.6cm depth; 10.047cm^3 volume. Character of Wound/Ulcer Post Debridement is stable. Severity of Tissue Post Debridement is: Fat layer exposed. Post procedure Diagnosis Wound #1: Same as Pre-Procedure Plan Wound Cleansing: Wound #1 Right,Medial Malleolus: Clean wound with Normal Saline. Wound #2 Left,Medial Lower Leg: Clean wound with Normal Saline. Anesthetic (add to Medication List): Wound #2 Left,Medial Lower Leg: Topical Lidocaine 4% cream applied to wound bed prior to debridement (In Clinic Only). Primary Wound Dressing: Wound #1 Right,Medial Malleolus: Other: - dakins soaked gauze with abd and ace wrap Wound #2 Left,Medial Lower Leg: Other: - Vaseline gauze Secondary Dressing: Wound #1 Right,Medial Malleolus: Twaddell, Nigel R. ([id number]) ABD pad Wound #2 Left,Medial  Lower Leg: ABD pad Dressing Change Frequency: Wound #1 Right,Medial Malleolus: Change dressing every day. Wound #2 Left,Medial Lower Leg: Change dressing every day. Follow-up Appointments: Wound #1 Right,Medial Malleolus: Return Appointment in 1 week. Wound #2 Left,Medial Lower Leg: Return Appointment in 1 week. Additional Orders / Instructions: Wound #1 Right,Medial Malleolus: Vitamin A; Vitamin C, Zinc Increase protein intake. Wound #2 Left,Medial Lower Leg: Vitamin A; Vitamin C, Zinc Increase protein intake. Laboratory ordered were: Wound culture routine - right malleolus The following medication(s) was prescribed: Dakin's Solution miscellaneous 0.25 % solution solution miscellaneous starting 07/19/2017 Electronic Signature(s) Signed: 07/18/2017 9:58:44 AM By: Lawanda Cousins Previous Signature: 07/18/2017 9:57:35 AM Version By: Lawanda Cousins Entered By: Lawanda Cousins on 07/18/2017 09:58:44 Bayliss, Shakeitha Alfonso Patten ([id number]) -------------------------------------------------------------------------------- ROS/PFSH Details Patient Name: [name]. Date of Service: 07/18/2017 8:00 AM Medical Record Number: [id number] Patient Account Number: [id number] Date of Birth/Sex: 28-Feb-1970 (47 y.o. Female) Treating RN: Montey Hora Primary Care Provider: Soyla Dryer Other Clinician: Referring Provider: Soyla Dryer Treating Provider/Extender: Cathie Olden in Treatment: 0 Information Obtained From Patient Wound History Do you currently have one or more open woundso Yes How many open wounds do you currently haveo 1 Approximately how long have you had your woundso 2 months How have you been treating your wound(s) until nowo wet to dry Has your wound(s) ever healed and then re-openedo No Have you had any lab work done in the past montho No Have you tested positive for an antibiotic resistant organism (MRSA, VRE)o No Have you tested positive for osteomyelitis (bone  infection)o No Have you had any tests for circulation on your legso No Constitutional Symptoms (General Health) Complaints and Symptoms: No Complaints or Symptoms Complaints and Symptoms: Negative for: Fatigue; Fever; Chills; Marked Weight Change Eyes Complaints and Symptoms: No Complaints or Symptoms Complaints and Symptoms: Negative for: Dry Eyes; Vision Changes Medical History: Negative for: Cataracts; Glaucoma; Optic Neuritis Ear/Nose/Mouth/Throat Complaints and Symptoms: Negative for: Difficult clearing ears; Sinusitis Medical History: Negative for: Chronic sinus problems/congestion; Middle ear problems Hematologic/Lymphatic Complaints and Symptoms: No Complaints or Symptoms Complaints and Symptoms:  Negative for: Bleeding / Clotting Disorders; Human Immunodeficiency Virus Medical History: MARKAYLA, REICHART ([id number]) Negative for: Anemia; Hemophilia; Human Immunodeficiency Virus; Lymphedema; Sickle Cell Disease Respiratory Complaints and Symptoms: No Complaints or Symptoms Complaints and Symptoms: Negative for: Chronic or frequent coughs; Shortness of Breath Medical History: Negative for: Aspiration; Asthma; Chronic Obstructive Pulmonary Disease (COPD); Pneumothorax; Sleep Apnea; Tuberculosis Cardiovascular Complaints and Symptoms: Positive for: LE edema Negative for: Chest pain Medical History: Positive for: Arrhythmia - a fib; Congestive Heart Failure; Hypertension Negative for: Angina; Coronary Artery Disease; Deep Vein Thrombosis; Hypotension; Myocardial Infarction; Peripheral Arterial Disease; Peripheral Venous Disease; Phlebitis; Vasculitis Gastrointestinal Complaints and Symptoms: No Complaints or Symptoms Complaints and Symptoms: Negative for: Frequent diarrhea; Nausea; Vomiting Medical History: Negative for: Cirrhosis ; Colitis; Crohnos; Hepatitis A; Hepatitis B; Hepatitis C Endocrine Complaints and Symptoms: No Complaints or Symptoms Complaints and  Symptoms: Negative for: Hepatitis; Thyroid disease; Polydypsia (Excessive Thirst) Medical History: Negative for: Type I Diabetes; Type II Diabetes Genitourinary Complaints and Symptoms: Negative for: Kidney failure/ Dialysis; Incontinence/dribbling Medical History: Negative for: End Stage Renal Disease Past Medical History Notes: CKD Immunological NETHA, DAFOE. ([id number]) Complaints and Symptoms: Negative for: Hives; Itching Medical History: Negative for: Lupus Erythematosus; Raynaudos; Scleroderma Integumentary (Skin) Complaints and Symptoms: Positive for: Wounds Negative for: Bleeding or bruising tendency; Breakdown; Swelling Medical History: Negative for: History of Burn; History of pressure wounds Musculoskeletal Complaints and Symptoms: Negative for: Muscle Pain; Muscle Weakness Medical History: Positive for: Gout Negative for: Rheumatoid Arthritis; Osteoarthritis; Osteomyelitis Neurologic Complaints and Symptoms: Negative for: Numbness/parasthesias; Focal/Weakness Medical History: Negative for: Dementia; Neuropathy; Quadriplegia; Paraplegia; Seizure Disorder Psychiatric Complaints and Symptoms: Negative for: Anxiety; Claustrophobia Medical History: Negative for: Anorexia/bulimia; Confinement Anxiety Oncologic Medical History: Negative for: Received Chemotherapy; Received Radiation Immunizations Pneumococcal Vaccine: Received Pneumococcal Vaccination: No Implantable Devices Family and Social History Cancer: Yes - Siblings; Diabetes: Yes - Mother; Heart Disease: Yes - Mother; Hereditary Spherocytosis: No; Hypertension: Yes - Mother; Kidney Disease: No; Lung Disease: No; Seizures: No; Stroke: Yes - Mother; Thyroid Problems: No; Tuberculosis: No; Never smoker; Marital Status - Single; Alcohol Use: Never; Drug Use: No History; Caffeine Use: Rarely; Financial Concerns: No; Food, Clothing or Shelter Needs: No; Support System Lacking: No; Transportation Concerns:  No; Advanced Directives: No; Patient does not want information on [place] ([id number]) Electronic Signature(s) Signed: 07/18/2017 12:15:14 PM By: Lawanda Cousins Signed: 07/18/2017 5:31:29 PM By: Montey Hora Entered By: Montey Hora on 07/18/2017 08:23:20 DIOSELINA, BRUMBAUGH ([id number]) -------------------------------------------------------------------------------- SuperBill Details Patient Name: AMARII, AMY. Date of Service: 07/18/2017 Medical Record Number: [id number] Patient Account Number: [id number] Date of Birth/Sex: [birth year] (47 y.o. 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 Modifier: muscle Quantity: 1 CPT4 Code: 26378588 Description: 11045 - DEB SUBQ TISS EA ADDL 20CM ICD-10 Diagnosis Description L97.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 Modifier: muscle Quantity: 1 Physician Procedures CPT4 Code: 5027741 Description: WC PHYS LEVEL 3 o NEW PT ICD-10 Diagnosis Description L97.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 I89.0 Lymphedema, not elsewhere  classified S80.822S Blister (nonthermal), left lower leg, sequela S80.821S Blister  (nonthermal), right lower leg, sequela Modifier: muscle Quantity: 1 CPT4 Code: 6962952 Description: 84132 - WC PHYS SUBQ TISS 20 SQ CM ICD-10 Diagnosis  Description G40.102 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 ([id number])

## 2017-07-21 LAB — AEROBIC CULTURE W GRAM STAIN (SUPERFICIAL SPECIMEN)

## 2017-07-21 LAB — AEROBIC CULTURE  (SUPERFICIAL SPECIMEN)

## 2017-07-25 ENCOUNTER — Ambulatory Visit: Payer: Medicaid Other | Admitting: Nurse Practitioner

## 2017-07-26 DIAGNOSIS — Z736 Limitation of activities due to disability: Secondary | ICD-10-CM

## 2017-07-30 ENCOUNTER — Ambulatory Visit: Payer: Self-pay | Admitting: Physician Assistant

## 2017-07-30 NOTE — Progress Notes (Signed)
Patient ID: [name], female   DOB: May 05, 1970, 47 y.o.   MRN: [id number]     Advanced Heart Failure Clinic Note   PCP: Dr Berdine Addison HF: Dr. Haroldine Laws   HPI: [name] is a 47 y.o. female AAF with history of HTN, gout, morbid obesity, chronic atrial fibrillation -on eliquis (had DC-CV 64/40/34), and diastolic heart failure.  Admitted to [facility] on 12/10/11 for massive volume overload. Transferred to [facility] on 10/26 due to worsening renal function, weakness and volume overload. SPEP/UPEP/fat pad biopsy/bone marrow negative for amyloid. Echo findings thought to be combination of severe HTN and PAH related to obesity-hypoventilation syndrome. Bubble study +. Suspect opening of PFO in setting of PAH. Diuresed ~100 pounds while in house. Cr seems to be new baseline ~2.5. Discharged from [facility] 01/04/12 to [place]. Discharged from [facility]  01/25/12. D/C  weight was 229 pounds.   Today she returns for Hf follow up. Overall feeling fine. Denies SOB/PND/Orthopnea. Appetite ok. No fever or chills. Weight at home  pounds. Taking all medications    RHC 02/13/17 RA = 9 RV = 82/6 PA = 80/31 (47) PCW = 18 Fick cardiac output/index = 7.2/3.1 PVR = 3.5 WU Ao sat = 96% PA sat = 68%, 70% SVC sat = 65%  PFTs 04/04/17 FVC 3.02 (79%) FEV1 2.03 (65%) DLCO 17.62 (52%)  ECHO 06/2016: EF 55-60% Severe LVH  74/2/59 Echo: diastolic heart failure with ejection fraction of 55-60%, severe LVH, Mildly dilated RV. Mildly to mod dilated RA. moderate pericardial effusion and echocardiographic changes consistent with a possible infiltrative disease. PAPP 72 mmHg   Milrinone stopped 12/26/2011 after RHC c/w high output HF.  RA = 16  RV = 81/14/20  PA = 78/32 (52)  PCW = 21  Fick cardiac output/index = 11.9/4.6  Thermo CO/CI = 9.8/3.8  PVR = 2.6 [place] (Fick)  FA sat = 91%  PA sat = 65%, 72%  SVC sat = 73%  RA sat = 66%    Review of systems complete and found to be negative unless listed in  HPI.    Past Medical History:  Diagnosis Date  . Anemia   . Atrial flutter ([place])   . Cellulitis   . CHF (congestive heart failure) ([place])   . Chronic diastolic heart failure ([place])    a. Amyloidosis work-up negative 2013  b. 12/07/2014 ECHO EF 55-60%. LA severely dilated. RV normal RA moderately dilated, Severe LVH.IVC dilated.   . CKD (chronic kidney disease) stage 3, GFR 30-59 ml/min (H[facility])   . Essential hypertension, benign   . Gout   . Gout   . History of cardiac catheterization    a. ectatic coronaries without obstruction 2006 - [place]  . Hypertensive heart disease   . Morbid obesity ([place])   . Persistent atrial fibrillation ([place])   . Pulmonary hypertension ([place])     Current Outpatient Medications  Medication Sig Dispense Refill  . amLODipine (NORVASC) 10 MG tablet Take 1 tablet (10 mg total) by mouth daily. 30 tablet 2  . apixaban (ELIQUIS) 5 MG TABS tablet Take 1 tablet (5 mg total) by mouth 2 (two) times daily. 180 tablet 3  . cloNIDine (CATAPRES) 0.3 MG tablet Take 1 tablet (0.3 mg total) by mouth 2 (two) times daily. May take an additional tab prn for SBP>180 60 tablet 2  . hydrALAZINE (APRESOLINE) 100 MG tablet Take 1 tablet (100 mg total) by mouth 3 (three) times daily. 90 tablet 11  . potassium  chloride SA (K-DUR,KLOR-CON) 20 MEQ tablet Take 1 tablet (20 mEq total) by mouth 2 (two) times daily. 60 tablet 2  . spironolactone (ALDACTONE) 25 MG tablet Take 0.5 tablets (12.5 mg total) by mouth daily. 15 tablet 3  . torsemide (DEMADEX) 20 MG tablet Take 2 tablets (40 mg total) by mouth 2 (two) times daily. 120 tablet 2   No current facility-administered medications for this encounter.    Vitals:   07/31/17 0847  BP: 110/68  Pulse: (!) 103  SpO2: 99%  Weight: 287 lb (130.2 kg)   Wt Readings from Last 3 Encounters:  07/31/17 287 lb (130.2 kg)  07/11/17 287 lb 8 oz (130.4 kg)  07/10/17 285 lb 12.8 oz (129.6 kg)   PHYSICAL EXAM: General:   No resp difficulty HEENT:  normal Neck: supple. JVP 6-7. Carotids 2+ bilat; no bruits. No lymphadenopathy or thryomegaly appreciated. Cor: PMI nondisplaced. Regular rate & rhythm. No rubs, gallops or murmurs. Lungs: coarse throughout  Abdomen: soft, nontender, nondistended. No hepatosplenomegaly. No bruits or masses. Good bowel sounds. Extremities: no cyanosis, clubbing, rash, R Foot/ankle wrapped. LLE compression wrap.  Neuro: alert & orientedx3, cranial nerves grossly intact. moves all 4 extremities w/o difficulty. Affect pleasant   ASSESSMENT & PLAN:  1) Chronic diastolic HF - Echo 03/9796 EF 55-60%.  - [place] 12/18  with continued moderate PAH. Wedge ok. Will proceed with w/u. May need pulmonary vasodilators  - With LVH and refractory HTN, we orderd Tc-PYP scan to r/o amyloid but she was too claustrophobic. Also can't get MRI. Will reconsider PYP down the road.  - Volume status stable. Continue torsemide 40 mg twice a day with an extra 20 mg for 3 pound weight gain.   2) Afib- chronic - Rate controlled.  - No bleeding on Eliquis.   3) PAH - Sleep study negative for OSA.  - Still has not attended VQ scan.  - PFTs with relatively normal spirometry, but decreased DLCO.  - Consider Pulmonary vasodilators  4) HTN urgency  -  CT abdomen without adrenal tumors in 11/13 - BP controlled -Continue current regimen.   5) CKD Stage III -I reviewed  BMET on 5/2. Stable.  She has Nephrology appointment 08/15/2017   6) Obesity - Body mass index is 42.08 kg/m. -Discussed portion contol.   6) Gout - Stable.   7) RLE wound, traumatic wound Followed at the [facility].    Follow up in July with Dr Haroldine Laws.   Darrick Grinder, NP  8:51 AM

## 2017-07-31 ENCOUNTER — Ambulatory Visit (HOSPITAL_COMMUNITY)
Admission: RE | Admit: 2017-07-31 | Discharge: 2017-07-31 | Disposition: A | Discharge status: Home / Self Care | Payer: Self-pay | Source: Ambulatory Visit | Attending: Internal Medicine | Admitting: Internal Medicine

## 2017-07-31 VITALS — BP 110/68 | HR 103 | Wt 287.0 lb

## 2017-07-31 DIAGNOSIS — N183 Chronic kidney disease, stage 3 unspecified: Secondary | ICD-10-CM

## 2017-07-31 DIAGNOSIS — Z7901 Long term (current) use of anticoagulants: Secondary | ICD-10-CM | POA: Insufficient documentation

## 2017-07-31 DIAGNOSIS — M109 Gout, unspecified: Secondary | ICD-10-CM | POA: Insufficient documentation

## 2017-07-31 DIAGNOSIS — I130 Hypertensive heart and chronic kidney disease with heart failure and stage 1 through stage 4 chronic kidney disease, or unspecified chronic kidney disease: Secondary | ICD-10-CM | POA: Insufficient documentation

## 2017-07-31 DIAGNOSIS — I10 Essential (primary) hypertension: Secondary | ICD-10-CM

## 2017-07-31 DIAGNOSIS — I313 Pericardial effusion (noninflammatory): Secondary | ICD-10-CM | POA: Insufficient documentation

## 2017-07-31 DIAGNOSIS — I160 Hypertensive urgency: Secondary | ICD-10-CM | POA: Insufficient documentation

## 2017-07-31 DIAGNOSIS — I272 Pulmonary hypertension, unspecified: Secondary | ICD-10-CM | POA: Insufficient documentation

## 2017-07-31 DIAGNOSIS — I4892 Unspecified atrial flutter: Secondary | ICD-10-CM | POA: Insufficient documentation

## 2017-07-31 DIAGNOSIS — Z79899 Other long term (current) drug therapy: Secondary | ICD-10-CM | POA: Insufficient documentation

## 2017-07-31 DIAGNOSIS — I5032 Chronic diastolic (congestive) heart failure: Secondary | ICD-10-CM | POA: Insufficient documentation

## 2017-07-31 DIAGNOSIS — Z6841 Body Mass Index (BMI) 40.0 and over, adult: Secondary | ICD-10-CM | POA: Insufficient documentation

## 2017-07-31 DIAGNOSIS — I481 Persistent atrial fibrillation: Secondary | ICD-10-CM | POA: Insufficient documentation

## 2017-07-31 DIAGNOSIS — I482 Chronic atrial fibrillation: Secondary | ICD-10-CM | POA: Insufficient documentation

## 2017-07-31 DIAGNOSIS — S81801A Unspecified open wound, right lower leg, initial encounter: Secondary | ICD-10-CM | POA: Insufficient documentation

## 2017-07-31 MED ORDER — TORSEMIDE 20 MG PO TABS: 40.0000 mg | ORAL_TABLET | Freq: Two times a day (BID) | ORAL | 11 refills | Status: AC

## 2017-07-31 NOTE — Patient Instructions (Signed)
Continue taking Torsemide 40 mg (2 tabs) twice daily. May take 1 extra 20 mg tablet once daily AS NEEDED for 3 lb weight gain.  Follow up as scheduled with Dr. Haroldine Laws.  Take all medication as prescribed the day of your appointment. Bring all medications with you to your appointment.  Do the following things EVERYDAY: 1) Weigh yourself in the morning before breakfast. Write it down and keep it in a log. 2) Take your medicines as prescribed 3) Eat low salt foods-Limit salt (sodium) to 2000 mg per day.  4) Stay as active as you can everyday 5) Limit all fluids for the day to less than 2 liters

## 2017-08-01 ENCOUNTER — Ambulatory Visit: Payer: Self-pay | Admitting: Physician Assistant

## 2017-08-01 ENCOUNTER — Encounter: Payer: Medicaid Other | Attending: Nurse Practitioner | Admitting: Nurse Practitioner

## 2017-08-01 DIAGNOSIS — N189 Chronic kidney disease, unspecified: Secondary | ICD-10-CM | POA: Insufficient documentation

## 2017-08-01 DIAGNOSIS — I890 Lymphedema, not elsewhere classified: Secondary | ICD-10-CM | POA: Insufficient documentation

## 2017-08-01 DIAGNOSIS — L97319 Non-pressure chronic ulcer of right ankle with unspecified severity: Secondary | ICD-10-CM | POA: Insufficient documentation

## 2017-08-01 DIAGNOSIS — Z880 Allergy status to penicillin: Secondary | ICD-10-CM | POA: Insufficient documentation

## 2017-08-01 DIAGNOSIS — I4891 Unspecified atrial fibrillation: Secondary | ICD-10-CM | POA: Insufficient documentation

## 2017-08-01 DIAGNOSIS — I509 Heart failure, unspecified: Secondary | ICD-10-CM | POA: Insufficient documentation

## 2017-08-01 DIAGNOSIS — I130 Hypertensive heart and chronic kidney disease with heart failure and stage 1 through stage 4 chronic kidney disease, or unspecified chronic kidney disease: Secondary | ICD-10-CM | POA: Insufficient documentation

## 2017-08-01 DIAGNOSIS — M109 Gout, unspecified: Secondary | ICD-10-CM | POA: Insufficient documentation

## 2017-08-02 NOTE — Progress Notes (Signed)
[name] ([id number]) Visit Report for 08/01/2017 Arrival Information Details Patient Name: [name], [name]. Date of Service: 08/01/2017 8:45 AM Medical Record Number: [id number] Patient Account Number: [id number] Date of Birth/Sex: 1970/10/04 (47 y.o. F) Treating RN: [name] Primary Care [name]: [name] Other Clinician: Referring [name]: [name] Treating [name]/Extender: [name] in Treatment: 2 Visit Information History Since Last Visit All ordered tests and consults were completed: No Patient Arrived: Ambulatory Added or deleted any medications: No Arrival Time: 08:36 Any new allergies or adverse reactions: No Accompanied By: boyfriend Had a fall or experienced change in No Transfer Assistance: None activities of daily living that may affect Patient Has Alerts: Yes risk of falls: Patient Alerts: Patient on Blood Thinner Signs or symptoms of abuse/neglect since last visito No Eliquis Hospitalized since last visit: No ABI [name] BILATERAL >220 Implantable device outside of the clinic excluding No cellular tissue based products placed in the center since last visit: Pain Present Now: No Electronic Signature(s) Signed: 08/01/2017 10:44:30 AM By: [name] Entered By: [name] on 08/01/2017 08:40:26 [name] ([id number]) -------------------------------------------------------------------------------- Clinic Level of Care Assessment Details Patient Name: [name], [name]. Date of Service: 08/01/2017 8:45 AM Medical Record Number: [id number] Patient Account Number: [id number] Date of Birth/Sex: [birth year] (47 y.o. F) Treating RN: [name] Primary Care [name]: [name] Other Clinician: Referring [name]: [name] Treating [name]/Extender: [name] in Treatment: 2 Clinic Level of Care Assessment Items TOOL 4 Quantity Score X - Use when only an EandM is performed on  FOLLOW-UP visit 1 0 ASSESSMENTS - Nursing Assessment / Reassessment X - Reassessment of Co-morbidities (includes updates in patient status) 1 10 X- 1 5 Reassessment of Adherence to Treatment Plan ASSESSMENTS - Wound and Skin Assessment / Reassessment []  - Simple Wound Assessment / Reassessment - one wound 0 X- 4 5 Complex Wound Assessment / Reassessment - multiple wounds []  - 0 Dermatologic / Skin Assessment (not related to wound area) ASSESSMENTS - Focused Assessment []  - Circumferential Edema Measurements - multi extremities 0 []  - 0 Nutritional Assessment / Counseling / Intervention []  - 0 Lower Extremity Assessment (monofilament, tuning fork, pulses) []  - 0 Peripheral Arterial Disease Assessment (using hand held doppler) ASSESSMENTS - Ostomy and/or Continence Assessment and Care []  - Incontinence Assessment and Management 0 []  - 0 Ostomy Care Assessment and Management (repouching, etc.) PROCESS - Coordination of Care X - Simple Patient / Family Education for ongoing care 1 15 []  - 0 Complex (extensive) Patient / Family Education for ongoing care []  - 0 Staff obtains Programmer, systems, Records, Test Results / Process Orders []  - 0 Staff telephones HHA, Nursing Homes / Clarify orders / etc []  - 0 Routine Transfer to another Facility (non-emergent condition) []  - 0 Routine Hospital Admission (non-emergent condition) []  - 0 New Admissions / Biomedical engineer / Ordering NPWT, Apligraf, etc. []  - 0 Emergency Hospital Admission (emergent condition) X- 1 10 Simple Discharge Coordination [name], [name] ([id number]) []  - 0 Complex (extensive) Discharge Coordination PROCESS - Special Needs []  - Pediatric / Minor Patient Management 0 []  - 0 Isolation Patient Management []  - 0 Hearing / Language / Visual special needs []  - 0 Assessment of Community assistance (transportation, D/C planning, etc.) []  - 0 Additional assistance / Altered mentation []  - 0 Support Surface(s)  Assessment (bed, cushion, seat, etc.) INTERVENTIONS - Wound Cleansing / Measurement []  - Simple Wound Cleansing - one wound 0 X- 4 5 Complex Wound Cleansing - multiple wounds X-  1 5 Wound Imaging (photographs - any number of wounds) []  - 0 Wound Tracing (instead of photographs) []  - 0 Simple Wound Measurement - one wound X- 4 5 Complex Wound Measurement - multiple wounds INTERVENTIONS - Wound Dressings []  - Small Wound Dressing one or multiple wounds 0 X- 2 15 Medium Wound Dressing one or multiple wounds []  - 0 Large Wound Dressing one or multiple wounds X- 1 5 Application of Medications - topical []  - 0 Application of Medications - injection INTERVENTIONS - Miscellaneous []  - External ear exam 0 []  - 0 Specimen Collection (cultures, biopsies, blood, body fluids, etc.) []  - 0 Specimen(s) / Culture(s) sent or taken to Lab for analysis []  - 0 Patient Transfer (multiple staff / Civil Service fast streamer / Similar devices) []  - 0 Simple Staple / Suture removal (25 or less) []  - 0 Complex Staple / Suture removal (26 or more) []  - 0 Hypo / Hyperglycemic Management (close monitor of Blood Glucose) []  - 0 Ankle / Brachial Index (ABI) - do not check if billed separately X- 1 5 Vital Signs [name], [name] ([id number]) Has the patient been seen at the hospital within the last three years: Yes Total Score: 145 Level Of Care: New/Established - Level 4 Electronic Signature(s) Signed: 08/01/2017 2:43:37 PM By: [name] Entered By: [name] on 08/01/2017 10:21:19 [name] ([id number]) -------------------------------------------------------------------------------- Lower Extremity Assessment Details Patient Name: [name]. Date of Service: 08/01/2017 8:45 AM Medical Record Number: [id number] Patient Account Number: [id number] Date of Birth/Sex: January 17, 1971 (47 y.o. F) Treating RN: [name] Primary Care [name]: [name] Other Clinician: Referring  [name]: [name] Treating [name]/Extender: [name] in Treatment: 2 Edema Assessment Assessed: [Left: No] [Right: No] [Left: Edema] [Right: :] Calf Left: Right: Point of Measurement: 38 cm From Medial Instep 49.5 cm 46 cm Ankle Left: Right: Point of Measurement: 12 cm From Medial Instep 33.2 cm 29 cm Vascular Assessment Pulses: Dorsalis Pedis Palpable: [Left:No] [Right:No] Doppler Audible: [Left:Yes] [Right:Yes] Posterior Tibial Extremity colors, hair growth, and conditions: Extremity Color: [Left:Hyperpigmented] [Right:Hyperpigmented] Hair Growth on Extremity: [Left:No] [Right:No] Temperature of Extremity: [Left:Warm] [Right:Warm] Capillary Refill: [Left:> 3 seconds] [Right:> 3 seconds] Toe Nail Assessment Left: Right: Thick: Yes Yes Discolored: No No Deformed: No No Improper Length and Hygiene: No No Electronic Signature(s) Signed: 08/01/2017 10:44:30 AM By: [name] Entered By: [name] on 08/01/2017 09:00:21 [place], Kaleen [name] ([id number]) -------------------------------------------------------------------------------- Multi Wound Chart Details Patient Name: [name]. Date of Service: 08/01/2017 8:45 AM Medical Record Number: [id number] Patient Account Number: [id number] Date of Birth/Sex: 03/31/1970 (47 y.o. F) Treating RN: [name] Primary Care Nikhil Osei: [name] Other Clinician: Referring Ellyce Lafevers: [name] Treating Saeed Toren/Extender: [name] in Treatment: 2 Vital Signs Height(in): 70 Pulse(bpm): 72 Weight(lbs): 287 Blood Pressure(mmHg): 172/100 Body Mass Index(BMI): 41 Temperature(F): 97.9 Respiratory Rate 18 (breaths/min): Photos: [1:No Photos] [2:No Photos] [3:No Photos] Wound Location: [1:Right, Medial Malleolus] [2:Left, Medial Lower Leg] [3:Left Lower Leg - Medial] Wounding Event: [1:Trauma] [2:Blister] [3:Gradually Appeared] Primary Etiology: [1:Venous Leg Ulcer]  [2:Venous Leg Ulcer] [3:Lymphedema] Comorbid History: [1:N/A] [2:N/A] [3:Arrhythmia, Congestive Heart Failure, Hypertension, Gout] Date Acquired: [1:05/13/2017] [2:07/06/2017] [3:08/01/2017] Weeks of Treatment: [1:2] [2:2] [3:0] Wound Status: [1:Open] [2:Open] [3:Open] Clustered Wound: [1:No] [2:Yes] [3:Yes] Measurements L x W x D [1:4.6x4x1.2] [2:5x3x0.1] [3:9.5x7.5x0.1] (cm) Area (cm) : [1:14.451] [2:11.781] [3:55.96] Volume (cm) : [1:17.342] [2:1.178] [3:5.596] % Reduction in Area: [1:13.70%] [2:71.00%] [3:N/A] % Reduction in Volume: [1:-72.60%] [2:71.00%] [3:N/A] Classification: [1:Full Thickness Without Exposed Support Structures] [2:Partial  Thickness] [3:Full Thickness Without Exposed Support Structures] Exudate Amount: [1:N/A] [2:N/A] [3:Large] Exudate Type: [1:N/A] [2:N/A] [3:Serous] Exudate Color: [1:N/A] [2:N/A] [3:amber] Wound Margin: [1:N/A] [2:N/A] [3:Indistinct, nonvisible] Granulation Amount: [1:N/A] [2:N/A] [3:Large (67-100%)] Granulation Quality: [1:N/A] [2:N/A] [3:Red] Necrotic Amount: [1:N/A] [2:N/A] [3:Small (1-33%)] Epithelialization: [1:N/A] [2:N/A] [3:None] Periwound Skin Texture: [1:No Abnormalities Noted] [2:No Abnormalities Noted] [3:Excoriation: No Induration: No Callus: No Crepitus: No Rash: No Scarring: No] Periwound Skin Moisture: [1:No Abnormalities Noted] [2:No Abnormalities Noted] [3:Maceration: No Dry/Scaly: No] Periwound Skin Color: [1:No Abnormalities Noted] [2:No Abnormalities Noted] [3:Atrophie Blanche: No Cyanosis: No Ecchymosis: No] Erythema: No Hemosiderin Staining: No Mottled: No Pallor: No Rubor: No Temperature: N/A N/A No Abnormality Tenderness on Palpation: No No No Wound Preparation: N/A N/A Ulcer Cleansing: Rinsed/Irrigated with Saline Topical Anesthetic Applied: None Wound Number: 4 N/A N/A Photos: No Photos N/A N/A Wound Location: Left Lower Leg - Midline, N/A N/A Proximal Wounding Event: Gradually Appeared N/A N/A Primary  Etiology: Lymphedema N/A N/A Comorbid History: Arrhythmia, Congestive Heart N/A N/A Failure, Hypertension, Gout Date Acquired: 08/01/2017 N/A N/A Weeks of Treatment: 0 N/A N/A Wound Status: Open N/A N/A Clustered Wound: Yes N/A N/A Measurements L x W x D 6x5x0.1 N/A N/A (cm) Area (cm) : 23.562 N/A N/A Volume (cm) : 2.356 N/A N/A % Reduction in Area: N/A N/A N/A % Reduction in Volume: N/A N/A N/A Classification: Full Thickness Without N/A N/A Exposed Support Structures Exudate Amount: Large N/A N/A Exudate Type: Serous N/A N/A Exudate Color: amber N/A N/A Wound Margin: Indistinct, nonvisible N/A N/A Granulation Amount: Large (67-100%) N/A N/A Granulation Quality: Red N/A N/A Necrotic Amount: Small (1-33%) N/A N/A Exposed Structures: Fascia: No N/A N/A Fat Layer (Subcutaneous Tissue) Exposed: No Tendon: No Muscle: No Joint: No Bone: No Epithelialization: None N/A N/A Periwound Skin Texture: Excoriation: Yes N/A N/A Induration: No Callus: No Crepitus: No Rash: No Scarring: No Periwound Skin Moisture: Maceration: No N/A N/A Dry/Scaly: No Periwound Skin Color: N/A N/A [name], [name] ([id number]) Atrophie Blanche: No Cyanosis: No Ecchymosis: No Erythema: No Hemosiderin Staining: No Mottled: No Pallor: No Rubor: No Temperature: N/A N/A N/A Tenderness on Palpation: No N/A N/A Wound Preparation: Ulcer Cleansing: N/A N/A Rinsed/Irrigated with Saline Topical Anesthetic Applied: None Treatment Notes Electronic Signature(s) Signed: 08/01/2017 10:11:37 AM By: Lawanda Cousins Entered By: Lawanda Cousins on 08/01/2017 10:11:37 [name], [name] ([id number]) -------------------------------------------------------------------------------- [place] Details Patient Name: [name], [name]. Date of Service: 08/01/2017 8:45 AM Medical Record Number: [id number] Patient Account Number: [id number] Date of Birth/Sex: 05-Feb-1971 (47 y.o. F) Treating RN:  [name] Primary Care Deshia Vanderhoof: [name] Other Clinician: Referring Lorma Heater: [name] Treating Jorene Kaylor/Extender: [name] in Treatment: 2 Active Inactive ` Orientation to the Wound Care Program Nursing Diagnoses: Knowledge deficit related to the wound healing center program Goals: Patient/caregiver will verbalize understanding of the [facility] Program Date Initiated: 07/18/2017 Target Resolution Date: 08/08/2017 Goal Status: Active Interventions: Provide education on orientation to the wound center Notes: ` Wound/Skin Impairment Nursing Diagnoses: Impaired tissue integrity Goals: Patient/caregiver will verbalize understanding of skin care regimen Date Initiated: 07/18/2017 Target Resolution Date: 08/09/2017 Goal Status: Active Ulcer/skin breakdown will have a volume reduction of 30% by week 4 Date Initiated: 07/18/2017 Target Resolution Date: 08/09/2017 Goal Status: Active Interventions: Assess patient/caregiver ability to obtain necessary supplies Assess patient/caregiver ability to perform ulcer/skin care regimen upon admission and as needed Assess ulceration(s) every visit Treatment Activities: Skin care regimen initiated : 07/18/2017 Notes: Electronic Signature(s) Signed: 08/01/2017 2:43:37 PM By: [name]  [name], GUISE ([id number]) Entered By: [name] on 08/01/2017 09:14:46 BECKY, COLAN ([id number]) -------------------------------------------------------------------------------- Pain Assessment Details Patient Name: [name], [name]. Date of Service: 08/01/2017 8:45 AM Medical Record Number: [id number] Patient Account Number: [id number] Date of Birth/Sex: 19-Apr-1970 (47 y.o. F) Treating RN: [name] Primary Care Sharayah Renfrow: [name] Other Clinician: Referring Tyric Rodeheaver: [name] Treating Bluford Sedler/Extender: [name] in Treatment: 2 Active Problems Location of Pain  Severity and Description of Pain Patient Has Paino No Site Locations Pain Management and Medication Current Pain Management: Electronic Signature(s) Signed: 08/01/2017 10:44:30 AM By: [name] Entered By: [name] on 08/01/2017 08:41:22 Cargle, Adreanna Alfonso [name] ([id number]) -------------------------------------------------------------------------------- Wound Assessment Details Patient Name: [name], Beverly R. Date of Service: 08/01/2017 8:45 AM Medical Record Number: [id number] Patient Account Number: [id number] Date of Birth/Sex: 30-Jun-1970 (47 y.o. F) Treating RN: [name] Primary Care Shaqueta Casady: [name] Other Clinician: Referring Shihab States: [name] Treating Saddie Sandeen/Extender: [name] in Treatment: 2 Wound Status Wound Number: 1 Primary Etiology: Venous Leg Ulcer Wound Location: Right, Medial Malleolus Wound Status: Open Wounding Event: Trauma Date Acquired: 05/13/2017 Weeks Of Treatment: 2 Clustered Wound: No Photos Photo Uploaded By: [name] on 08/01/2017 10:41:31 Wound Measurements Length: (cm) 4.6 Width: (cm) 4 Depth: (cm) 1.2 Area: (cm) 14.451 Volume: (cm) 17.342 % Reduction in Area: 13.7% % Reduction in Volume: -72.6% Wound Description Full Thickness Without Exposed Support Classification: Structures Periwound Skin Texture Texture Color No Abnormalities Noted: No No Abnormalities Noted: No Moisture No Abnormalities Noted: No Electronic Signature(s) Signed: 08/01/2017 10:44:30 AM By: [name] Entered By: [name] on 08/01/2017 08:48:37 Makki, Tamberly Alfonso [name] ([id number]) -------------------------------------------------------------------------------- Wound Assessment Details Patient Name: [name], Beverly R. Date of Service: 08/01/2017 8:45 AM Medical Record Number: [id number] Patient Account Number: [id number] Date of Birth/Sex: September 07, 1970 (47 y.o. F) Treating RN: [name] Primary Care  Kym Fenter: [name] Other Clinician: Referring Colston Pyle: [name] Treating Milus Fritze/Extender: [name] in Treatment: 2 Wound Status Wound Number: 2 Primary Etiology: Venous Leg Ulcer Wound Location: Left, Medial Lower Leg Wound Status: Open Wounding Event: Blister Date Acquired: 07/06/2017 Weeks Of Treatment: 2 Clustered Wound: Yes Photos Photo Uploaded By: [name] on 08/01/2017 10:42:12 Wound Measurements Length: (cm) 5 Width: (cm) 3 Depth: (cm) 0.1 Area: (cm) 11.781 Volume: (cm) 1.178 % Reduction in Area: 71% % Reduction in Volume: 71% Wound Description Classification: Partial Thickness Periwound Skin Texture Texture Color No Abnormalities Noted: No No Abnormalities Noted: No Moisture No Abnormalities Noted: No Electronic Signature(s) Signed: 08/01/2017 10:44:30 AM By: [name] Entered By: [name] on 08/01/2017 08:48:37 [name], [name] ([id number]) -------------------------------------------------------------------------------- Wound Assessment Details Patient Name: [name]. Date of Service: 08/01/2017 8:45 AM Medical Record Number: [id number] Patient Account Number: [id number] Date of Birth/Sex: 03/04/1970 (47 y.o. F) Treating RN: [name] Primary Care Breckin Zafar: [name] Other Clinician: Referring Deola Rewis: [name] Treating Derrika Ruffalo/Extender: [name] in Treatment: 2 Wound Status Wound Number: 3 Primary Lymphedema Etiology: Wound Location: Left Lower Leg - Medial Wound Status: Open Wounding Event: Gradually Appeared Comorbid Arrhythmia, Congestive Heart Failure, Date Acquired: 08/01/2017 History: Hypertension, Gout Weeks Of Treatment: 0 Clustered Wound: Yes Photos Photo Uploaded By: [name] on 08/01/2017 10:42:12 Wound Measurements Length: (cm) 9.5 Width: (cm) 7.5 Depth: (cm) 0.1 Area: (cm) 55.96 Volume: (cm) 5.596 % Reduction in Area: %  Reduction in Volume: Epithelialization: None Tunneling: No Undermining: No Wound Description Full Thickness Without Exposed Support Classification: Structures Wound Margin: Indistinct, nonvisible Exudate Large Amount: Exudate Type: Serous Exudate Color: amber  Foul Odor After Cleansing: No Slough/Fibrino Yes Wound Bed Granulation Amount: Large (67-100%) Exposed Structure Granulation Quality: Red Fascia Exposed: No Necrotic Amount: Small (1-33%) Fat Layer (Subcutaneous Tissue) Exposed: Yes Necrotic Quality: Adherent Slough Tendon Exposed: No Muscle Exposed: No Joint Exposed: No Bone Exposed: No Hudnall, Zania R. ([id number]) Periwound Skin Texture Texture Color No Abnormalities Noted: No No Abnormalities Noted: No Callus: No Atrophie Blanche: No Crepitus: No Cyanosis: No Excoriation: No Ecchymosis: No Induration: No Erythema: No Rash: No Hemosiderin Staining: No Scarring: No Mottled: No Pallor: No Moisture Rubor: No No Abnormalities Noted: No Dry / Scaly: No Temperature / Pain Maceration: No Temperature: No Abnormality Wound Preparation Ulcer Cleansing: Rinsed/Irrigated with Saline Topical Anesthetic Applied: None Electronic Signature(s) Signed: 08/01/2017 10:44:30 AM By: [name] Entered By: [name] on 08/01/2017 08:52:01 Kriz, Ceriah [name] Kitchen ([id number]) -------------------------------------------------------------------------------- Wound Assessment Details Patient Name: HAJA, CREGO R. Date of Service: 08/01/2017 8:45 AM Medical Record Number: [id number] Patient Account Number: [id number] Date of Birth/Sex: March 12, 1970 (47 y.o. F) Treating RN: [name] Primary Care Tashae Inda: [name] Other Clinician: Referring Kaniesha Barile: [name] Treating Bladyn Tipps/Extender: [name] in Treatment: 2 Wound Status Wound Number: 4 Primary Lymphedema Etiology: Wound Location: Left Lower Leg - Midline, Proximal Wound  Status: Open Wounding Event: Gradually Appeared Comorbid Arrhythmia, Congestive Heart Failure, Date Acquired: 08/01/2017 History: Hypertension, Gout Weeks Of Treatment: 0 Clustered Wound: Yes Photos Photo Uploaded By: [name] on 08/01/2017 10:42:35 Wound Measurements Length: (cm) 6 Width: (cm) 5 Depth: (cm) 0.1 Area: (cm) 23.562 Volume: (cm) 2.356 % Reduction in Area: % Reduction in Volume: Epithelialization: None Tunneling: No Undermining: No Wound Description Full Thickness Without Exposed Support Classification: Structures Wound Margin: Indistinct, nonvisible Exudate Large Amount: Exudate Type: Serous Exudate Color: amber Foul Odor After Cleansing: No Slough/Fibrino No Wound Bed Granulation Amount: Large (67-100%) Exposed Structure Granulation Quality: Red Fascia Exposed: No Necrotic Amount: Small (1-33%) Fat Layer (Subcutaneous Tissue) Exposed: No Necrotic Quality: Adherent Slough Tendon Exposed: No Muscle Exposed: No Joint Exposed: No Bone Exposed: No Saxe, Fronia R. ([id number]) Periwound Skin Texture Texture Color No Abnormalities Noted: No No Abnormalities Noted: No Callus: No Atrophie Blanche: No Crepitus: No Cyanosis: No Excoriation: Yes Ecchymosis: No Induration: No Erythema: No Rash: No Hemosiderin Staining: No Scarring: No Mottled: No Pallor: No Moisture Rubor: No No Abnormalities Noted: No Dry / Scaly: No Maceration: No Wound Preparation Ulcer Cleansing: Rinsed/Irrigated with Saline Topical Anesthetic Applied: None Electronic Signature(s) Signed: 08/01/2017 10:44:30 AM By: [name] Entered By: [name] on 08/01/2017 08:55:52 [place], Brittnee Alfonso [name] ([id number]) -------------------------------------------------------------------------------- Vitals Details Patient Name: [name]. Date of Service: 08/01/2017 8:45 AM Medical Record Number: [id number] Patient Account Number: [id number] Date of Birth/Sex:  1970-04-26 (47 y.o. F) Treating RN: [name] Primary Care Laderrick Wilk: [name] Other Clinician: Referring Verble Styron: [name] Treating Caylan Schifano/Extender: [name] in Treatment: 2 Vital Signs Time Taken: 08:41 Temperature (F): 97.9 Height (in): 70 Pulse (bpm): 83 Weight (lbs): 287 Respiratory Rate (breaths/min): 18 Body Mass Index (BMI): 41.2 Blood Pressure (mmHg): 172/100 Reference Range: 80 - 120 mg / dl Electronic Signature(s) Signed: 08/01/2017 2:43:37 PM By: [name] Entered By: [name] on 08/01/2017 09:19:15

## 2017-08-02 NOTE — Progress Notes (Signed)
Beverly, [name] ([id number]) Visit Report for 08/01/2017 Chief Complaint Document Details Patient Name: [name], [name]. Date of Service: 08/01/2017 8:45 AM Medical Record Number: [id number] Patient Account Number: [id number] Date of Birth/Sex: [birth year] (47 y.o. F) Treating RN: Ahmed Prima Primary Care Provider: Soyla Dryer Other Clinician: Referring Provider: Soyla Dryer Treating Provider/Extender: Cathie Olden in Treatment: 2 Information Obtained from: Patient Chief Complaint She is here for right medial malleolus ulcer and BLE edema with blisters Electronic Signature(s) Signed: 08/01/2017 10:11:51 AM By: Lawanda Cousins Entered By: Lawanda Cousins on 08/01/2017 10:11:50 Beverly, [name] ([id number]) -------------------------------------------------------------------------------- HPI Details Patient Name: [name]. Date of Service: 08/01/2017 8:45 AM Medical Record Number: [id number] Patient Account Number: [id number] Date of Birth/Sex: [birth year] (47 y.o. F) Treating RN: Ahmed Prima Primary Care Provider: Soyla Dryer Other Clinician: Referring Provider: Soyla Dryer Treating Provider/Extender: Cathie Olden in Treatment: 2 History of Present Illness HPI Description: 07/18/17-She is here for initial evaluation of a right medial malleolus ulcer and bilateral lower extremity edema with blisters. She is nondiabetic, she denies remote or present history of smoking. She does see cardiology for congestive heart failure. She has chronic bilateral lower extremity edema, left greater than right. She states the ulcer to the right medial malleolus has been present for approximately 2-3 months, she has gone to the [facility] in [place] and seen her cardiologist. She was on Bactrim o1 dose and has completed; has been applying saline wet-to-dry dressings to the medial malleolus ulcer. She has not worn compression therapy and states she has never  been instructed to wear compression therapy. It appears she has a component of lymphedema. She is uninsured and obtaining prescriptions and products will be difficult with the circumstances. She was instructed to contact her primary regarding pain medication. she last saw her cardiologist on 5/2, at which time she was advised to go to the emergency department. She had a plain film x-ray of her right ankle which was negative for osteomyelitis at that time. 08/01/17-She is here in follow-up evaluation for a right medial malleolus ulcer and bilateral lower extremity edema with blisters. Blisters to the right lower extremity are completely resolved, blisters to left lower extremity are significantly improved and the ulcer to the right medial malleolus is improved with more granulation tissue, continues to have nonviable tissue. She does not tolerate debridement. We will continue with same treatment plan and she will follow up next week. It is noted that in triage her blood pressure was recorded as 192/110, change in blood pressure cuff revealed a blood pressure of 170s /100 which is within her range. She did see cardiology yesterday with no changes in her medication regimen. She was advised to monitor her blood pressure throughout the day, she does have when necessary clonidine. Electronic Signature(s) Signed: 08/01/2017 10:17:14 AM By: Lawanda Cousins Entered By: Lawanda Cousins on 08/01/2017 10:17:13 [name], [name] ([id number]) -------------------------------------------------------------------------------- Physician Orders Details Patient Name: [name], [name]. Date of Service: 08/01/2017 8:45 AM Medical Record Number: [id number] Patient Account Number: [id number] Date of Birth/Sex: 03/14/1970 (47 y.o. F) Treating RN: Ahmed Prima Primary Care Provider: Soyla Dryer Other Clinician: Referring Provider: Soyla Dryer Treating Provider/Extender: Cathie Olden in Treatment: 2 Verbal /  Phone Orders: Yes Clinician: Pinkerton, Debi Read Back and Verified: Yes Diagnosis Coding Wound Cleansing Wound #1 Right,Medial Malleolus o Clean wound with Normal Saline. o Cleanse wound with mild soap and water o May Shower, gently pat wound dry prior to applying new dressing. Wound #2  Left,Medial Lower Leg o Clean wound with Normal Saline. o Cleanse wound with mild soap and water o May Shower, gently pat wound dry prior to applying new dressing. Wound #3 Left,Medial Lower Leg o Clean wound with Normal Saline. o Cleanse wound with mild soap and water o May Shower, gently pat wound dry prior to applying new dressing. Wound #4 Left,Proximal,Midline Lower Leg o Clean wound with Normal Saline. o Cleanse wound with mild soap and water o May Shower, gently pat wound dry prior to applying new dressing. Anesthetic (add to Medication List) Wound #1 Right,Medial Malleolus o Topical Lidocaine 4% cream applied to wound bed prior to debridement (In Clinic Only). Primary Wound Dressing Wound #1 Right,Medial Malleolus o Other: - dakins soaked gauze with abd and ace wrap Wound #2 Left,Medial Lower Leg o Mepitel One Contact layer Wound #3 Left,Medial Lower Leg o Mepitel One Contact layer Wound #4 Left,Proximal,Midline Lower Leg o Mepitel One Contact layer Secondary Dressing Wound #1 Right,Medial Malleolus o ABD pad o Dry Gauze Wound #2 Left,Medial Lower Leg [name], Beverly R. ([id number]) o ABD pad o Dry Gauze Wound #3 Left,Medial Lower Leg o ABD pad o Dry Gauze Wound #4 Left,Proximal,Midline Lower Leg o ABD pad o Dry Gauze Dressing Change Frequency Wound #1 Right,Medial Malleolus o Change dressing every day. Wound #2 Left,Medial Lower Leg o Change dressing every day. Wound #3 Left,Medial Lower Leg o Change dressing every day. Wound #4 Left,Proximal,Midline Lower Leg o Change dressing every day. Follow-up  Appointments Wound #1 Right,Medial Malleolus o Return Appointment in 1 week. Wound #2 Left,Medial Lower Leg o Return Appointment in 1 week. Wound #3 Left,Medial Lower Leg o Return Appointment in 1 week. Wound #4 Left,Proximal,Midline Lower Leg o Return Appointment in 1 week. Edema Control Wound #1 Right,Medial Malleolus o Elevate legs to the level of the heart and pump ankles as often as possible o Other: - ace wrap Wound #2 Left,Medial Lower Leg o Elevate legs to the level of the heart and pump ankles as often as possible o Other: - ace wrap Wound #3 Left,Medial Lower Leg o Elevate legs to the level of the heart and pump ankles as often as possible o Other: - ace wrap Wound #4 Left,Proximal,Midline Lower Leg o Elevate legs to the level of the heart and pump ankles as often as possible o Other: - ace wrap Additional Orders / Instructions Wound #1 Right,Medial Malleolus Vantassell, Kyleigha R. ([id number]) o Vitamin A; Vitamin C, Zinc o Increase protein intake. Wound #2 Left,Medial Lower Leg o Vitamin A; Vitamin C, Zinc o Increase protein intake. Wound #3 Left,Medial Lower Leg o Vitamin A; Vitamin C, Zinc o Increase protein intake. Wound #4 Left,Proximal,Midline Lower Leg o Vitamin A; Vitamin C, Zinc o Increase protein intake. Patient Medications Allergies: penicillin Notifications Medication Indication Start End lidocaine DOSE 1 - topical 4 % cream - 1 cream topical Electronic Signature(s) Signed: 08/01/2017 2:43:37 PM By: Alric Quan Signed: 08/01/2017 4:23:13 PM By: Lawanda Cousins Entered By: Alric Quan on 08/01/2017 09:25:43 [name], Beverly R. ([id number]) -------------------------------------------------------------------------------- Prescription 08/01/2017 Patient Name: IREANNA, FINLAYSON. Provider: Lawanda Cousins NP Date of Birth: January 12, 1971 NPI#: [id number] Sex: F DEA#: [id number] Phone #: [phone] License #: Patient  Address: [facility] [place], [place] [address] [address], [place], [place] [address] [phone] Allergies penicillin Medication Medication: Route: Strength: Form: lidocaine 4 % topical cream topical 4% cream Class: TOPICAL LOCAL ANESTHETICS Dose: Frequency / Time: Indication:  1 1 cream topical Number of Refills: Number of Units: 0 Generic Substitution: Start Date: End Date: One Time Use: Substitution Permitted No Note to Pharmacy: Signature(s): Date(s): Electronic Signature(s) Signed: 08/01/2017 2:43:37 PM By: Alric Quan Signed: 08/01/2017 4:23:13 PM By: Lawanda Cousins Entered By: Alric Quan on 08/01/2017 09:25:43 Beshears, Brittiny R. ([id number]) --------------------------------------------------------------------------------  Problem List Details Patient Name: ROBERTO, HLAVATY. Date of Service: 08/01/2017 8:45 AM Medical Record Number: [id number] Patient Account Number: [id number] Date of Birth/Sex: 1970/05/08 (47 y.o. 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 Date of Service: 08/01/2017 8:45 AM Medical Record Number: [id number] Patient Account Number: [id number] Date of Birth/Sex: 1970/04/08 (47 y.o. F) Treating RN: Ahmed Prima Primary Care Provider: Soyla Dryer Other Clinician: Referring Provider: Soyla Dryer Treating Provider/Extender: Cathie Olden in Treatment: 2 Subjective Chief Complaint Information obtained from Patient She is here for right medial malleolus ulcer and BLE edema with blisters History of Present Illness (HPI) 07/18/17-She is here for initial evaluation of a right medial malleolus ulcer and bilateral lower extremity edema with blisters. She is nondiabetic, she denies remote or present history of smoking. She does see cardiology for congestive heart failure. She has chronic bilateral lower extremity edema, left greater than right. She states the ulcer to the right medial malleolus has been present for approximately 2-3 months, she has gone to the free health clinic in [place] and seen her cardiologist. She was on Bactrim o1 dose and has completed; has been applying saline wet-to-dry dressings to the medial malleolus ulcer. She has not worn compression therapy and states she has never been instructed to wear compression therapy. It appears she has a component of lymphedema. She is uninsured and obtaining prescriptions and products will be difficult with the circumstances. She was instructed to contact her primary regarding pain medication. she last saw her cardiologist on 5/2, at which time she was advised to go to the emergency department. She had a plain film x-ray of her right ankle which was negative for osteomyelitis at that time. 08/01/17-She is here in follow-up evaluation for a right medial malleolus ulcer and bilateral lower extremity edema with  blisters. Blisters to the right lower extremity are completely resolved, blisters to left lower extremity are significantly improved and the ulcer to the right medial malleolus is improved with more granulation tissue, continues to have nonviable tissue. She does not tolerate debridement. We will continue with same treatment plan and she will follow up next week. It is noted that in triage her blood pressure was recorded as 192/110, change in blood pressure cuff revealed a blood pressure of 170s /100 which is within her range. She did see cardiology yesterday with no changes in her medication regimen. She was advised to monitor her blood pressure throughout the day, she does have when necessary clonidine. Patient History Information obtained from Patient. Family History Cancer - Siblings, Diabetes - Mother, Heart  Disease - Mother, Hypertension - Mother, Stroke - Mother, No family history of Hereditary Spherocytosis, Kidney Disease, Lung Disease, Seizures, Thyroid Problems, Tuberculosis. Social History Never smoker, Marital Status - Single, Alcohol Use - Never, Drug Use - No History, Caffeine Use - Rarely. Medical And Surgical History Notes Genitourinary CKD [name], [name] ([id number]) Objective Constitutional Vitals Time Taken: 8:41 AM, Height: 70 in, Weight: 287 lbs, BMI: 41.2, Temperature: 97.9 F, Pulse: 83 bpm, Respiratory Rate: 18 breaths/min, Blood Pressure: 172/100 mmHg. Integumentary (Hair, Skin) Wound #1 status is Open. Original cause of wound was Trauma. The wound is located on the Right,Medial Malleolus. The wound measures 4.6cm length x 4cm width x 1.2cm depth; 14.451cm^2 area and 17.342cm^3 volume. Wound #2 status is Open. Original cause of wound was Blister. The wound is located on the Left,Medial Lower Leg. The wound measures 5cm length x 3cm width x 0.1cm depth; 11.781cm^2 area and 1.178cm^3 volume. Wound #3 status is Open. Original cause of wound was Gradually Appeared.  The wound is located on the Left,Medial Lower Leg. The wound measures 9.5cm length x 7.5cm width x 0.1cm depth; 55.96cm^2 area and 5.596cm^3 volume. There is Fat Layer (Subcutaneous Tissue) Exposed exposed. There is no tunneling or undermining noted. There is a large amount of serous drainage noted. The wound margin is indistinct and nonvisible. There is large (67-100%) red granulation within the wound bed. There is a small (1-33%) amount of necrotic tissue within the wound bed including Adherent Slough. The periwound skin appearance did not exhibit: Callus, Crepitus, Excoriation, Induration, Rash, Scarring, Dry/Scaly, Maceration, Atrophie Blanche, Cyanosis, Ecchymosis, Hemosiderin Staining, Mottled, Pallor, Rubor, Erythema. Periwound temperature was noted as No Abnormality. Wound #4 status is Open. Original cause of wound was Gradually Appeared. The wound is located on the Left,Proximal,Midline Lower Leg. The wound measures 6cm length x 5cm width x 0.1cm depth; 23.562cm^2 area and 2.356cm^3 volume. There is no tunneling or undermining noted. There is a large amount of serous drainage noted. The wound margin is indistinct and nonvisible. There is large (67-100%) red granulation within the wound bed. There is a small (1-33%) amount of necrotic tissue within the wound bed including Adherent Slough. The periwound skin appearance exhibited: Excoriation. The periwound skin appearance did not exhibit: Callus, Crepitus, Induration, Rash, Scarring, Dry/Scaly, Maceration, Atrophie Blanche, Cyanosis, Ecchymosis, Hemosiderin Staining, Mottled, Pallor, Rubor, Erythema. 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 Cleanse wound with  mild soap and water [name], [name] ([id number]) May Shower, gently pat wound dry prior to applying new dressing. Wound #2 Left,Medial Lower Leg: Clean wound with Normal Saline. Cleanse wound with mild soap and water May Shower, gently pat wound dry prior to applying new dressing. Wound #3 Left,Medial Lower Leg: Clean wound with Normal Saline. Cleanse wound with mild soap and water May Shower, gently pat wound dry prior to applying new dressing. Wound #4 Left,Proximal,Midline Lower Leg: Clean wound with Normal Saline. Cleanse wound with mild soap and water May Shower, gently pat wound dry prior to applying new dressing. Anesthetic (add to Medication List): Wound #1 Right,Medial Malleolus: Topical Lidocaine 4% cream applied to wound bed prior to debridement (In Clinic Only). Primary Wound Dressing: Wound #1 Right,Medial Malleolus: Other: - dakins soaked gauze with abd and ace wrap Wound #2 Left,Medial Lower Leg: Mepitel One Contact layer Wound #  3 Left,Medial Lower Leg: Mepitel One Contact layer Wound #4 Left,Proximal,Midline Lower Leg: Mepitel One Contact layer Secondary Dressing: Wound #1 Right,Medial Malleolus: ABD pad Dry Gauze Wound #2 Left,Medial Lower Leg: ABD pad Dry Gauze Wound #3 Left,Medial Lower Leg: ABD pad Dry Gauze Wound #4 Left,Proximal,Midline Lower Leg: ABD pad Dry Gauze Dressing Change Frequency: Wound #1 Right,Medial Malleolus: Change dressing every day. Wound #2 Left,Medial Lower Leg: Change dressing every day. Wound #3 Left,Medial Lower Leg: Change dressing every day. Wound #4 Left,Proximal,Midline Lower Leg: Change dressing every day. Follow-up Appointments: Wound #1 Right,Medial Malleolus: Return Appointment in 1 week. Wound #2 Left,Medial Lower Leg: Return Appointment in 1 week. Wound #3 Left,Medial Lower Leg: Return Appointment in 1 week. Wound #4 Left,Proximal,Midline Lower Leg: Return Appointment in 1 week. Edema Control: Wound #1  Right,Medial Malleolus: Elevate legs to the level of the heart and pump ankles as often as possible Other: - ace wrap [name], Beverly R. ([id number]) Wound #2 Left,Medial Lower Leg: Elevate legs to the level of the heart and pump ankles as often as possible Other: - ace wrap Wound #3 Left,Medial Lower Leg: Elevate legs to the level of the heart and pump ankles as often as possible Other: - ace wrap Wound #4 Left,Proximal,Midline Lower Leg: Elevate legs to the level of the heart and pump ankles as often as possible Other: - ace wrap Additional Orders / Instructions: Wound #1 Right,Medial Malleolus: Vitamin A; Vitamin C, Zinc Increase protein intake. Wound #2 Left,Medial Lower Leg: Vitamin A; Vitamin C, Zinc Increase protein intake. Wound #3 Left,Medial Lower Leg: Vitamin A; Vitamin C, Zinc Increase protein intake. Wound #4 Left,Proximal,Midline Lower Leg: Vitamin A; Vitamin C, Zinc Increase protein intake. The following medication(s) was prescribed: lidocaine topical 4 % cream 1 1 cream topical was prescribed at facility Electronic Signature(s) Signed: 08/01/2017 10:19:31 AM By: Lawanda Cousins Entered By: Lawanda Cousins on 08/01/2017 10:19:31 [name], [name] ([id number]) -------------------------------------------------------------------------------- ROS/PFSH Details Patient Name: [name]. Date of Service: 08/01/2017 8:45 AM Medical Record Number: [id number] Patient Account Number: [id number] Date of Birth/Sex: December 14, 1970 (47 y.o. F) Treating RN: Ahmed Prima Primary Care Provider: Soyla Dryer Other Clinician: Referring Provider: Soyla Dryer Treating Provider/Extender: Cathie Olden in Treatment: 2 Information Obtained From Patient Wound History Do you currently have one or more open woundso Yes How many open wounds do you currently haveo 1 Approximately how long have you had your woundso 2 months How have you been treating your wound(s) until nowo  wet to dry Has your wound(s) ever healed and then re-openedo No Have you had any lab work done in the past montho No Have you tested positive for an antibiotic resistant organism (MRSA, VRE)o No Have you tested positive for osteomyelitis (bone infection)o No Have you had any tests for circulation on your legso No Eyes Medical History: Negative for: Cataracts; Glaucoma; Optic Neuritis Ear/Nose/Mouth/Throat Medical History: Negative for: Chronic sinus problems/congestion; Middle ear problems Hematologic/Lymphatic Medical History: Negative for: Anemia; Hemophilia; Human Immunodeficiency Virus; Lymphedema; Sickle Cell Disease Respiratory Medical History: Negative for: Aspiration; Asthma; Chronic Obstructive Pulmonary Disease (COPD); Pneumothorax; Sleep Apnea; Tuberculosis Cardiovascular Medical History: Positive for: Arrhythmia - a fib; Congestive Heart Failure; Hypertension Negative for: Angina; Coronary Artery Disease; Deep Vein Thrombosis; Hypotension; Myocardial Infarction; Peripheral Arterial Disease; Peripheral Venous Disease; Phlebitis; Vasculitis Gastrointestinal Medical History: Negative for: Cirrhosis ; Colitis; Crohnos; Hepatitis A; Hepatitis B; Hepatitis C Endocrine [name], [name]. ([id number]) Medical History: Negative for: Type I Diabetes; Type II Diabetes Genitourinary Medical History: Negative for: End  Stage Renal Disease Past Medical History Notes: CKD Immunological Medical History: Negative for: Lupus Erythematosus; Raynaudos; Scleroderma Integumentary (Skin) Medical History: Negative for: History of Burn; History of pressure wounds Musculoskeletal Medical History: Positive for: Gout Negative for: Rheumatoid Arthritis; Osteoarthritis; Osteomyelitis Neurologic Medical History: Negative for: Dementia; Neuropathy; Quadriplegia; Paraplegia; Seizure Disorder Oncologic Medical History: Negative for: Received Chemotherapy; Received  Radiation Psychiatric Medical History: Negative for: Anorexia/bulimia; Confinement Anxiety Immunizations Pneumococcal Vaccine: Received Pneumococcal Vaccination: No Implantable Devices Family and Social History Cancer: Yes - Siblings; Diabetes: Yes - Mother; Heart Disease: Yes - Mother; Hereditary Spherocytosis: No; Hypertension: Yes - Mother; Kidney Disease: No; Lung Disease: No; Seizures: No; Stroke: Yes - Mother; Thyroid Problems: No; Tuberculosis: No; Never smoker; Marital Status - Single; Alcohol Use: Never; Drug Use: No History; Caffeine Use: Rarely; Financial Concerns: No; Food, Clothing or Shelter Needs: No; Support System Lacking: No; Transportation Concerns: No; Advanced Directives: No; Patient does not want information on Advanced Directives Physician Affirmation I have reviewed and agree with the above information. Electronic Signature(s) [name], [name] ([id number]) Signed: 08/01/2017 2:43:37 PM By: Alric Quan Signed: 08/01/2017 4:23:13 PM By: Lawanda Cousins Entered By: Lawanda Cousins on 08/01/2017 10:17:24 [name], [name] ([id number]) -------------------------------------------------------------------------------- SuperBill Details Patient Name: SOCORRO, KANITZ. Date of Service: 08/01/2017 Medical Record Number: [id number] Patient Account Number: [id number] Date of Birth/Sex: [birth year] (47 y.o. 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 S80.822S Blister (nonthermal), left lower leg, sequela S80.821S Blister (nonthermal), right lower leg, sequela Facility Procedures CPT4 Code: [id number] Description: 99214 - WOUND CARE VISIT-LEV 4 EST  PT Modifier: Quantity: 1 Physician Procedures CPT4 Code: 1025852 Description: 77824 - WC PHYS LEVEL 3 - EST PT ICD-10 Diagnosis Description I89.0 Lymphedema, not elsewhere classified S80.822S Blister (nonthermal), left lower leg, sequela 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

## 2017-08-08 ENCOUNTER — Ambulatory Visit: Payer: Medicaid Other | Admitting: Nurse Practitioner

## 2017-08-12 ENCOUNTER — Encounter: Payer: Self-pay | Admitting: Physician Assistant

## 2017-08-22 ENCOUNTER — Ambulatory Visit: Payer: Self-pay | Admitting: Nurse Practitioner

## 2017-08-28 ENCOUNTER — Encounter (HOSPITAL_COMMUNITY): Payer: Medicaid Other | Admitting: Internal Medicine

## 2017-09-19 ENCOUNTER — Other Ambulatory Visit (HOSPITAL_COMMUNITY): Payer: Self-pay | Admitting: Internal Medicine

## 2017-09-19 DIAGNOSIS — I272 Pulmonary hypertension, unspecified: Secondary | ICD-10-CM

## 2017-09-19 MED FILL — AMLODIPINE BESYLATE 10 MG T: 10 | 34 days supply | Qty: 34 | Fill #0

## 2017-09-19 MED FILL — POTASSIUM CL ER 20 MEQ TAB: 20 | 34 days supply | Qty: 68 | Fill #0

## 2017-09-19 MED FILL — SPIRONOLACTONE 25 MG TABLET: 25 | 30 days supply | Qty: 15 | Fill #3

## 2017-09-19 MED FILL — hydrALAZINE HCL 100 MG TABS: 100 | 30 days supply | Qty: 90 | Fill #2

## 2017-09-23 MED FILL — cloNIDine HCL 0.3 MG TABS: 0.3 | 30 days supply | Qty: 60 | Fill #0

## 2017-09-23 MED FILL — TORSEMIDE 20 MG TABLET: 20 | 30 days supply | Qty: 120 | Fill #0

## 2017-10-07 ENCOUNTER — Telehealth: Payer: Self-pay | Admitting: Licensed Clinical Social Worker

## 2017-10-07 NOTE — Telephone Encounter (Signed)
Patient called to request assistance with her electric bill. Patient states that her power was shut off for non payment. She reports that her sister has been paying the bill and left for vacation and never paid the bill. CSW assisted with payment to restore power this afternoon. Patient is grateful for assistance. CSW available as needed. [name], [place], [place]

## 2017-10-18 ENCOUNTER — Encounter (HOSPITAL_COMMUNITY): Payer: Self-pay

## 2017-10-22 ENCOUNTER — Telehealth: Payer: Self-pay | Admitting: Licensed Clinical Social Worker

## 2017-10-22 NOTE — Telephone Encounter (Signed)
CSW received call from patient requesting letter from MD for food stamps stating she is unable to work. CSW encouraged patient to discuss further with her PCP as she has multiple health issues other than HF. Patient states she has an appointment tomorrow with PCP. [name], [place], [place]

## 2017-10-23 ENCOUNTER — Inpatient Hospital Stay (HOSPITAL_COMMUNITY)
Admission: EM | Admit: 2017-10-23 | Discharge: 2017-10-26 | DRG: 603 | Disposition: A | Discharge status: Home / Self Care | Payer: Medicaid Other | Source: Ambulatory Visit | Attending: Internal Medicine | Admitting: Internal Medicine

## 2017-10-23 ENCOUNTER — Ambulatory Visit: Payer: Medicaid Other | Admitting: Physician Assistant

## 2017-10-23 ENCOUNTER — Other Ambulatory Visit: Payer: Self-pay

## 2017-10-23 ENCOUNTER — Encounter (HOSPITAL_COMMUNITY): Payer: Self-pay

## 2017-10-23 ENCOUNTER — Emergency Department (HOSPITAL_COMMUNITY): Payer: Medicaid Other

## 2017-10-23 ENCOUNTER — Encounter: Payer: Self-pay | Admitting: Physician Assistant

## 2017-10-23 VITALS — BP 142/83 | HR 85 | Temp 97.7°F | Ht 69.25 in | Wt 269.0 lb

## 2017-10-23 DIAGNOSIS — L97812 Non-pressure chronic ulcer of other part of right lower leg with fat layer exposed: Secondary | ICD-10-CM | POA: Diagnosis present

## 2017-10-23 DIAGNOSIS — L089 Local infection of the skin and subcutaneous tissue, unspecified: Secondary | ICD-10-CM | POA: Diagnosis present

## 2017-10-23 DIAGNOSIS — B964 Proteus (mirabilis) (morganii) as the cause of diseases classified elsewhere: Secondary | ICD-10-CM | POA: Diagnosis present

## 2017-10-23 DIAGNOSIS — Z8249 Family history of ischemic heart disease and other diseases of the circulatory system: Secondary | ICD-10-CM | POA: Diagnosis not present

## 2017-10-23 DIAGNOSIS — M109 Gout, unspecified: Secondary | ICD-10-CM | POA: Diagnosis present

## 2017-10-23 DIAGNOSIS — N184 Chronic kidney disease, stage 4 (severe): Secondary | ICD-10-CM | POA: Diagnosis present

## 2017-10-23 DIAGNOSIS — I272 Pulmonary hypertension, unspecified: Secondary | ICD-10-CM | POA: Diagnosis present

## 2017-10-23 DIAGNOSIS — Z9119 Patient's noncompliance with other medical treatment and regimen: Secondary | ICD-10-CM

## 2017-10-23 DIAGNOSIS — I481 Persistent atrial fibrillation: Secondary | ICD-10-CM | POA: Diagnosis present

## 2017-10-23 DIAGNOSIS — Z6837 Body mass index (BMI) 37.0-37.9, adult: Secondary | ICD-10-CM | POA: Diagnosis not present

## 2017-10-23 DIAGNOSIS — D631 Anemia in chronic kidney disease: Secondary | ICD-10-CM | POA: Diagnosis present

## 2017-10-23 DIAGNOSIS — I130 Hypertensive heart and chronic kidney disease with heart failure and stage 1 through stage 4 chronic kidney disease, or unspecified chronic kidney disease: Secondary | ICD-10-CM | POA: Diagnosis present

## 2017-10-23 DIAGNOSIS — Z825 Family history of asthma and other chronic lower respiratory diseases: Secondary | ICD-10-CM

## 2017-10-23 DIAGNOSIS — Z91199 Patient's noncompliance with other medical treatment and regimen due to unspecified reason: Secondary | ICD-10-CM

## 2017-10-23 DIAGNOSIS — I509 Heart failure, unspecified: Secondary | ICD-10-CM

## 2017-10-23 DIAGNOSIS — I10 Essential (primary) hypertension: Secondary | ICD-10-CM

## 2017-10-23 DIAGNOSIS — L0390 Cellulitis, unspecified: Secondary | ICD-10-CM | POA: Diagnosis present

## 2017-10-23 DIAGNOSIS — Z7901 Long term (current) use of anticoagulants: Secondary | ICD-10-CM

## 2017-10-23 DIAGNOSIS — L97919 Non-pressure chronic ulcer of unspecified part of right lower leg with unspecified severity: Secondary | ICD-10-CM

## 2017-10-23 DIAGNOSIS — I5032 Chronic diastolic (congestive) heart failure: Secondary | ICD-10-CM | POA: Diagnosis present

## 2017-10-23 DIAGNOSIS — E669 Obesity, unspecified: Secondary | ICD-10-CM

## 2017-10-23 DIAGNOSIS — Z8051 Family history of malignant neoplasm of kidney: Secondary | ICD-10-CM | POA: Diagnosis not present

## 2017-10-23 DIAGNOSIS — R601 Generalized edema: Secondary | ICD-10-CM

## 2017-10-23 DIAGNOSIS — L98499 Non-pressure chronic ulcer of skin of other sites with unspecified severity: Secondary | ICD-10-CM

## 2017-10-23 DIAGNOSIS — I4819 Other persistent atrial fibrillation: Secondary | ICD-10-CM | POA: Diagnosis present

## 2017-10-23 DIAGNOSIS — Z880 Allergy status to penicillin: Secondary | ICD-10-CM | POA: Diagnosis not present

## 2017-10-23 DIAGNOSIS — L98492 Non-pressure chronic ulcer of skin of other sites with fat layer exposed: Secondary | ICD-10-CM

## 2017-10-23 DIAGNOSIS — Z833 Family history of diabetes mellitus: Secondary | ICD-10-CM | POA: Diagnosis not present

## 2017-10-23 DIAGNOSIS — L03115 Cellulitis of right lower limb: Principal | ICD-10-CM | POA: Diagnosis present

## 2017-10-23 DIAGNOSIS — N189 Chronic kidney disease, unspecified: Secondary | ICD-10-CM

## 2017-10-23 DIAGNOSIS — T148XXA Other injury of unspecified body region, initial encounter: Secondary | ICD-10-CM

## 2017-10-23 DIAGNOSIS — I4891 Unspecified atrial fibrillation: Secondary | ICD-10-CM

## 2017-10-23 LAB — BASIC METABOLIC PANEL
Anion gap: 8 (ref 5–15)
BUN: 45 mg/dL — AB (ref 6–20)
CALCIUM: 8.5 mg/dL — AB (ref 8.9–10.3)
CO2: 16 mmol/L — AB (ref 22–32)
CREATININE: 2.74 mg/dL — AB (ref 0.44–1.00)
Chloride: 115 mmol/L — ABNORMAL HIGH (ref 98–111)
GFR calc non Af Amer: 20 mL/min — ABNORMAL LOW (ref >60)
GFR, EST AFRICAN AMERICAN: 23 mL/min — AB (ref >60)
Glucose, Bld: 106 mg/dL — ABNORMAL HIGH (ref 70–99)
Potassium: 4.3 mmol/L (ref 3.5–5.1)
SODIUM: 139 mmol/L (ref 135–145)

## 2017-10-23 LAB — CBC WITH DIFFERENTIAL/PLATELET
BASOS PCT: 0 %
Basophils Absolute: 0 10*3/uL (ref 0.0–0.1)
EOS ABS: 0.1 10*3/uL (ref 0.0–0.7)
Eosinophils Relative: 1 %
HEMATOCRIT: 27.6 % — AB (ref 36.0–46.0)
Hemoglobin: 8.4 g/dL — ABNORMAL LOW (ref 12.0–15.0)
LYMPHS ABS: 1 10*3/uL (ref 0.7–4.0)
Lymphocytes Relative: 8 %
MCH: 30.1 pg (ref 26.0–34.0)
MCHC: 30.4 g/dL (ref 30.0–36.0)
MCV: 98.9 fL (ref 78.0–100.0)
MONO ABS: 0.8 10*3/uL (ref 0.1–1.0)
Monocytes Relative: 7 %
Neutro Abs: 10.6 10*3/uL — ABNORMAL HIGH (ref 1.7–7.7)
Neutrophils Relative %: 84 %
Platelets: 297 10*3/uL (ref 150–400)
RBC: 2.79 MIL/uL — ABNORMAL LOW (ref 3.87–5.11)
RDW: 13.8 % (ref 11.5–15.5)
WBC: 12.6 10*3/uL — ABNORMAL HIGH (ref 4.0–10.5)

## 2017-10-23 MED ORDER — VANCOMYCIN HCL IN DEXTROSE 1-5 GM/200ML-% IV SOLN
1000.0000 mg | Freq: Once | INTRAVENOUS | Status: AC
  Administered 2017-10-23: 1000 mg via INTRAVENOUS
  Filled 2017-10-23: qty 200

## 2017-10-23 MED ORDER — AMLODIPINE BESYLATE 5 MG PO TABS
10.0000 mg | ORAL_TABLET | Freq: Every day | ORAL | Status: DC
  Administered 2017-10-24 – 2017-10-26 (×3): 10 mg via ORAL
  Filled 2017-10-23 (×3): qty 2

## 2017-10-23 MED ORDER — VANCOMYCIN HCL 10 G IV SOLR
1500.0000 mg | INTRAVENOUS | Status: DC
  Administered 2017-10-24 – 2017-10-25 (×2): 1500 mg via INTRAVENOUS
  Filled 2017-10-23 (×5): qty 1500

## 2017-10-23 MED ORDER — SENNOSIDES-DOCUSATE SODIUM 8.6-50 MG PO TABS
1.0000 | ORAL_TABLET | Freq: Every day | ORAL | Status: DC
  Administered 2017-10-23 – 2017-10-24 (×2): 1 via ORAL
  Filled 2017-10-23 (×3): qty 1

## 2017-10-23 MED ORDER — SODIUM CHLORIDE 0.9 % IV SOLN
2.0000 g | INTRAVENOUS | Status: DC
  Administered 2017-10-23 – 2017-10-25 (×3): 2 g via INTRAVENOUS
  Filled 2017-10-23 (×5): qty 2

## 2017-10-23 MED ORDER — MORPHINE SULFATE (PF) 4 MG/ML IV SOLN
4.0000 mg | Freq: Once | INTRAVENOUS | Status: AC
  Administered 2017-10-23: 4 mg via INTRAVENOUS
  Filled 2017-10-23: qty 1

## 2017-10-23 MED ORDER — APIXABAN 5 MG PO TABS
5.0000 mg | ORAL_TABLET | Freq: Two times a day (BID) | ORAL | Status: DC
  Administered 2017-10-23 – 2017-10-26 (×6): 5 mg via ORAL
  Filled 2017-10-23 (×6): qty 1

## 2017-10-23 MED ORDER — SODIUM CHLORIDE 0.9% FLUSH
3.0000 mL | Freq: Two times a day (BID) | INTRAVENOUS | Status: DC
  Administered 2017-10-23 – 2017-10-25 (×4): 3 mL via INTRAVENOUS

## 2017-10-23 MED ORDER — HYDRALAZINE HCL 25 MG PO TABS
100.0000 mg | ORAL_TABLET | Freq: Three times a day (TID) | ORAL | Status: DC
  Administered 2017-10-23 – 2017-10-26 (×8): 100 mg via ORAL
  Filled 2017-10-23 (×8): qty 4

## 2017-10-23 MED ORDER — TORSEMIDE 20 MG PO TABS
40.0000 mg | ORAL_TABLET | Freq: Two times a day (BID) | ORAL | Status: DC
Start: 2017-10-23 — End: 2017-10-24
  Administered 2017-10-23 – 2017-10-24 (×3): 40 mg via ORAL
  Filled 2017-10-23 (×4): qty 2

## 2017-10-23 MED ORDER — ONDANSETRON HCL 4 MG PO TABS: 4.0000 mg | ORAL_TABLET | Freq: Four times a day (QID) | ORAL | Status: DC | PRN

## 2017-10-23 MED ORDER — CLONIDINE HCL 0.2 MG PO TABS
0.3000 mg | ORAL_TABLET | Freq: Two times a day (BID) | ORAL | Status: DC
  Administered 2017-10-23 – 2017-10-26 (×6): 0.3 mg via ORAL
  Filled 2017-10-23 (×6): qty 1

## 2017-10-23 MED ORDER — ONDANSETRON HCL 4 MG/2ML IJ SOLN: 4.0000 mg | Freq: Four times a day (QID) | INTRAMUSCULAR | Status: DC | PRN

## 2017-10-23 MED ORDER — VANCOMYCIN HCL IN DEXTROSE 1-5 GM/200ML-% IV SOLN
1000.0000 mg | Freq: Once | INTRAVENOUS | Status: AC
  Administered 2017-10-23: 1000 mg via INTRAVENOUS
  Filled 2017-10-23 (×2): qty 200

## 2017-10-23 MED ORDER — ACETAMINOPHEN 650 MG RE SUPP: 650.0000 mg | Freq: Four times a day (QID) | RECTAL | Status: DC | PRN

## 2017-10-23 MED ORDER — METRONIDAZOLE 500 MG PO TABS
500.0000 mg | ORAL_TABLET | Freq: Three times a day (TID) | ORAL | Status: DC
  Administered 2017-10-23 – 2017-10-25 (×6): 500 mg via ORAL
  Filled 2017-10-23 (×6): qty 1

## 2017-10-23 MED ORDER — SPIRONOLACTONE 25 MG PO TABS
12.5000 mg | ORAL_TABLET | Freq: Every day | ORAL | Status: DC
  Administered 2017-10-24 – 2017-10-26 (×3): 12.5 mg via ORAL
  Filled 2017-10-23 (×5): qty 0.5
  Filled 2017-10-23 (×3): qty 1

## 2017-10-23 MED ORDER — POTASSIUM CHLORIDE CRYS ER 20 MEQ PO TBCR
20.0000 meq | EXTENDED_RELEASE_TABLET | Freq: Two times a day (BID) | ORAL | Status: DC
  Administered 2017-10-23 – 2017-10-26 (×6): 20 meq via ORAL
  Filled 2017-10-23 (×6): qty 1

## 2017-10-23 MED ORDER — SODIUM CHLORIDE 0.9% FLUSH: 3.0000 mL | INTRAVENOUS | Status: DC | PRN

## 2017-10-23 MED ORDER — ACETAMINOPHEN 325 MG PO TABS: 650.0000 mg | ORAL_TABLET | Freq: Four times a day (QID) | ORAL | Status: DC | PRN

## 2017-10-23 MED ORDER — SODIUM CHLORIDE 0.9 % IV SOLN
250.0000 mL | INTRAVENOUS | Status: DC | PRN
  Administered 2017-10-23: 250 mL via INTRAVENOUS

## 2017-10-23 MED ORDER — FENTANYL CITRATE (PF) 100 MCG/2ML IJ SOLN
25.0000 ug | INTRAMUSCULAR | Status: DC | PRN
  Administered 2017-10-23 – 2017-10-24 (×6): 25 ug via INTRAVENOUS
  Filled 2017-10-23 (×6): qty 2

## 2017-10-23 NOTE — ED Triage Notes (Addendum)
Pt sent by free clinic due to wound for approx 5 months to left medial. Pt sent because of possible infection. Pt reports she is also being seen at wound clinic. Noted to have an odor

## 2017-10-23 NOTE — Progress Notes (Signed)
BP (!) 142/83 (BP Location: Right Arm, Patient Position: Sitting, Cuff Size: Large)   Pulse 85   Temp 97.7 F (36.5 C) (Other (Comment))   Ht 5' 9.25" (1.759 m)   Wt 269 lb (122 kg)   LMP 09/28/2017 (Approximate)   SpO2 97%   BMI 39.44 kg/m    Subjective:    Patient ID: [name], female    DOB: 1970-10-15, 47 y.o.   MRN: [id number]  HPI: [name] is a 47 y.o. female presenting on 10/23/2017 for Follow-up   HPI  Pt ws seen here in May for new patent appointment.  She was referred to wound clinic and was seen there 5/23 and 08/01/17  She was seen by cardiology 07/31/17 for follow-up of heart failure. .  Since that time, pt has been having no-shows.  She has missed appointments here, at the wound clinic and with the cardiologist.  She also was a no-show to her mammogram appintment.    Pt says reason she came in now is because she wants a paper signed so she can get food stamps.   Relevant past medical, surgical, family and social history reviewed and updated as indicated. Interim medical history since our last visit reviewed. Allergies and medications reviewed and updated.   Current Outpatient Medications:  .  amLODipine (NORVASC) 10 MG tablet, TAKE 1 TABLET BY MOUTH DAILY., Disp: 30 tablet, Rfl: 0 .  apixaban (ELIQUIS) 5 MG TABS tablet, Take 1 tablet (5 mg total) by mouth 2 (two) times daily., Disp: 180 tablet, Rfl: 3 .  cloNIDine (CATAPRES) 0.3 MG tablet, TAKE 1 TABLET BY MOUTH 2 TIMES DAILY. MAY TAKE AN ADDITIONAL TABLET AS NEEDED FOR SBP>180, Disp: 60 tablet, Rfl: 0 .  hydrALAZINE (APRESOLINE) 100 MG tablet, Take 1 tablet (100 mg total) by mouth 3 (three) times daily., Disp: 90 tablet, Rfl: 11 .  potassium chloride SA (K-DUR,KLOR-CON) 20 MEQ tablet, TAKE 1 TABLET BY MOUTH 2 TIMES DAILY., Disp: 60 tablet, Rfl: 0 .  spironolactone (ALDACTONE) 25 MG tablet, Take 0.5 tablets (12.5 mg total) by mouth daily., Disp: 15 tablet, Rfl: 3 .  torsemide (DEMADEX) 20 MG tablet, Take  2 tablets (40 mg total) by mouth 2 (two) times daily. Take extra 20 mg tablet once daily as needed for 3 lb weight gain., Disp: 150 tablet, Rfl: 11   Review of Systems  Constitutional: Negative for appetite change, chills, diaphoresis, fatigue, fever and unexpected weight change.  HENT: Positive for congestion. Negative for dental problem, drooling, ear pain, facial swelling, hearing loss, mouth sores, sneezing, sore throat, trouble swallowing and voice change.   Eyes: Negative for pain, discharge, redness, itching and visual disturbance.  Respiratory: Positive for cough. Negative for choking, shortness of breath and wheezing.   Cardiovascular: Positive for leg swelling. Negative for chest pain and palpitations.  Gastrointestinal: Negative for abdominal pain, blood in stool, constipation, diarrhea and vomiting.  Endocrine: Negative for cold intolerance, heat intolerance and polydipsia.  Genitourinary: Negative for decreased urine volume, dysuria and hematuria.  Musculoskeletal: Negative for arthralgias, back pain and gait problem.  Skin: Negative for rash.  Allergic/Immunologic: Negative for environmental allergies.  Neurological: Negative for seizures, syncope, light-headedness and headaches.  Hematological: Negative for adenopathy.  Psychiatric/Behavioral: Negative for agitation, dysphoric mood and suicidal ideas. The patient is not nervous/anxious.     Per HPI unless specifically indicated above     Objective:    BP (!) 142/83 (BP Location: Right Arm, Patient Position: Sitting, Cuff Size:  Large)   Pulse 85   Temp 97.7 F (36.5 C) (Other (Comment))   Ht 5' 9.25" (1.759 m)   Wt 269 lb (122 kg)   LMP 09/28/2017 (Approximate)   SpO2 97%   BMI 39.44 kg/m   Wt Readings from Last 3 Encounters:  10/23/17 269 lb (122 kg)  07/31/17 287 lb (130.2 kg)  07/11/17 287 lb 8 oz (130.4 kg)    Physical Exam  Constitutional: She is oriented to person, place, and time. She appears  well-developed and well-nourished.  HENT:  Head: Normocephalic and atraumatic.  Neck: Neck supple.  Cardiovascular: Normal rate. An irregular rhythm present.  Pulmonary/Chest: Effort normal. No respiratory distress. She has rales.  Musculoskeletal: She exhibits edema.  Neurological: She is alert and oriented to person, place, and time.  Skin:  Large ulcer LLE with depth for approximately 1 inch.  This is significantly larger than wound at initial OV (in media tab dated 06/27/17).  Very foul odor.  Wound is dripping fluid.    Psychiatric: She has a normal mood and affect. Her behavior is normal.  Nursing note and vitals reviewed.          Assessment & Plan:    Encounter Diagnoses  Name Primary?  . Ulcer of right lower extremity, unspecified ulcer stage ([place]) Yes  . Personal history of noncompliance with medical treatment, presenting hazards to health   . Accelerated hypertension   . Atrial fibrillation with controlled ventricular response ([place])   . Chronic kidney disease, unspecified CKD stage   . Pulmonary hypertension ([place])   . Chronic diastolic heart failure ([place])   . Anasarca   . Obesity, unspecified classification, unspecified obesity type, unspecified whether serious comorbidity present   . Anticoagulant long-term use     -pt is instructed to go to the ER now for further evaluation and treatment of the LLE wound.  Discussed with pt that this is limb threatening so she needs to go now.  The triage desk was contacted and notified that pt was being sent -pt was scheduled to [facility]TO next week for follow up

## 2017-10-23 NOTE — H&P (Addendum)
History and Physical  [name] OHY:[id number] DOB: 12-16-1970 DOA: 10/23/2017  Referring physician: Lita Mains  PCP: Soyla Dryer, PA-C   Chief Complaint: wound infection  HPI: [name] is a 47 y.o. female with chronic diastolic CHF with accelerated hypertension, atrial fibrillation, chronic anticoagulation, noncompliance who was sent to ED from primary care office with worsening ulceration and infection of a chronic wound of the right ankle that has gotten a little worse over past couple of weeks.  The patient has missed multiple appointments at wound care center and with primary care clinic.  She was seen today and they were concerned because the wound looked significantly worse and they were concerned about cellulitis. The patient was noted to have no fever or chills but there is a foul drainage coming from the wound.  The patient is not diabetic.  She says that she has been having difficulty getting to her appointments because of financial constraints. She denies chest pain, palpitations and SOB.   ED Course: WBC was elevated at 12, The patient was afebrile. Blood cultures obtained and patient was started on IV antibiotics.    Review of Systems: All systems reviewed and apart from history of presenting illness, are negative.   Past Medical History:  Diagnosis Date  . Anemia   . Atrial flutter ([place])   . Cellulitis   . CHF (congestive heart failure) ([place])   . Chronic diastolic heart failure ([place])    a. Amyloidosis work-up negative 2013  b. 12/07/2014 ECHO EF 55-60%. LA severely dilated. RV normal RA moderately dilated, Severe LVH.IVC dilated.   . CKD (chronic kidney disease) stage 3, GFR 30-59 ml/min (HCC)   . Essential hypertension, benign   . Gout   . Gout   . History of cardiac catheterization    a. ectatic coronaries without obstruction 2006 - [place]  . Hypertensive heart disease   . Morbid obesity ([place])   . Persistent atrial fibrillation ([place])   .  Pulmonary hypertension ([place])    Past Surgical History:  Procedure Laterality Date  . CARDIOVERSION  01/15/2012   Procedure: CARDIOVERSION;  Surgeon: Jolaine Artist, MD;  Location: [facility];  Service: Cardiovascular;  Laterality: N/A;  . RIGHT HEART CATH N/A 02/13/2017   Procedure: RIGHT HEART CATH;  Surgeon: Jolaine Artist, MD;  Location: [facility];  Service: Cardiovascular;  Laterality: N/A;  . RIGHT HEART CATHETERIZATION N/A 12/26/2011   Procedure: RIGHT HEART CATH;  Surgeon: Jolaine Artist, MD;  Location: [facility] CATH LAB;  Service: Cardiovascular;  Laterality: N/A;  . SKIN BIOPSY  12/18/2011   Procedure: BIOPSY SKIN;  Surgeon: Donato Heinz, MD;  Location: [facility];  Service: General;  Laterality: N/A;  in the minor room.  . TEE WITHOUT CARDIOVERSION  01/15/2012   Procedure: TRANSESOPHAGEAL ECHOCARDIOGRAM (TEE);  Surgeon: Jolaine Artist, MD;  Location: [facility] ENDOSCOPY;  Service: Cardiovascular;  Laterality: N/A;  cardioversion/on xarelto   Social History:  reports that she has never smoked. She has never used smokeless tobacco. She reports that she does not drink alcohol or use drugs.  Allergies  Allergen Reactions  . Penicillins Hives and Other (See Comments)    Childhood allergy. Has patient had a PCN reaction causing immediate rash, facial/tongue/throat swelling, SOB or lightheadedness with hypotension: NO Has patient had a PCN reaction causing severe rash involving mucus membranes or skin necrosis: No NO Has patient had a PCN reaction that required hospitalization No NO Has patient had a PCN  reaction occurring within the last 10 years: NO If all of the above answers are "NO", then may pro    Family History  Problem Relation Age of Onset  . Hypertension Mother   . Diabetes Mother   . Heart attack Mother   . Heart attack Father   . Hypertension Sister   . Asthma Sister   . Hypertension Maternal Aunt   . Hypertension Maternal Uncle   . Hypertension  Sister   . Cancer Sister        thyroid and Kidney cancer    Prior to Admission medications   Medication Sig Start Date End Date Taking? Authorizing Provider  amLODipine (NORVASC) 10 MG tablet TAKE 1 TABLET BY MOUTH DAILY. 09/23/17  Yes Bensimhon, Shaune Pascal, MD  apixaban (ELIQUIS) 5 MG TABS tablet Take 1 tablet (5 mg total) by mouth 2 (two) times daily. 11/23/16  Yes Jettie Booze E, NP  cloNIDine (CATAPRES) 0.3 MG tablet TAKE 1 TABLET BY MOUTH 2 TIMES DAILY. MAY TAKE AN ADDITIONAL TABLET AS NEEDED FOR SBP>180 09/23/17  Yes Bensimhon, Shaune Pascal, MD  hydrALAZINE (APRESOLINE) 100 MG tablet Take 1 tablet (100 mg total) by mouth 3 (three) times daily. 05/16/17  Yes Shirley Friar, PA-C  potassium chloride SA (K-DUR,KLOR-CON) 20 MEQ tablet TAKE 1 TABLET BY MOUTH 2 TIMES DAILY. 09/23/17  Yes Bensimhon, Shaune Pascal, MD  spironolactone (ALDACTONE) 25 MG tablet Take 0.5 tablets (12.5 mg total) by mouth daily. 05/13/17  Yes Bensimhon, Shaune Pascal, MD  torsemide (DEMADEX) 20 MG tablet Take 2 tablets (40 mg total) by mouth 2 (two) times daily. Take extra 20 mg tablet once daily as needed for 3 lb weight gain. 07/31/17  Yes Clegg, Amy D, NP   Physical Exam: Vitals:   10/23/17 1330 10/23/17 1400 10/23/17 1430 10/23/17 1500  BP: (!) 144/81 (!) 144/83 136/79 140/88  Pulse: 82 99 80 80  Resp:      Temp:      TempSrc:      SpO2:        General exam: Morbidly obese and appears chronically volume overloaded in no distress, cooperative, foul smell from right ankle wound.  Head, eyes and ENT: Nontraumatic and normocephalic. Pupils equally reacting to light and accommodation. Oral mucosa moist.  Neck: Supple. No JVD, carotid bruit or thyromegaly.  Lymphatics: No lymphadenopathy.  Respiratory system: Clear to auscultation. No increased work of breathing.  Cardiovascular system: normal S1 and S2 heard. Mild JVD, bilateral pedal edema.  Gastrointestinal system: Abdomen is nondistended, soft and nontender. Normal  bowel sounds heard. No organomegaly or masses appreciated.  Central nervous system: Alert and oriented. No focal neurological deficits.  Extremities: right ankle wound with large ulcerated neuropathic ulcer with purulent drainage and surrounding skin erythema, pink granulation tissue seen and necrotic edges. Foul smelling drainage.       Psychiatry: Pleasant and cooperative.  Labs on Admission:  Basic Metabolic Panel: Recent Labs  Lab 10/23/17 1347  NA 139  K 4.3  CL 115*  CO2 16*  GLUCOSE 106*  BUN 45*  CREATININE 2.74*  CALCIUM 8.5*   Liver Function Tests: No results for input(s): AST, ALT, ALKPHOS, BILITOT, PROT, ALBUMIN in the last 168 hours. No results for input(s): LIPASE, AMYLASE in the last 168 hours. No results for input(s): AMMONIA in the last 168 hours. CBC: Recent Labs  Lab 10/23/17 1347  WBC 12.6*  NEUTROABS 10.6*  HGB 8.4*  HCT 27.6*  MCV 98.9  PLT 297  Cardiac Enzymes: No results for input(s): CKTOTAL, CKMB, CKMBINDEX, TROPONINI in the last 168 hours.  BNP (last 3 results) No results for input(s): PROBNP in the last 8760 hours. CBG: No results for input(s): GLUCAP in the last 168 hours.  Radiological Exams on Admission: Dg Ankle Complete Right  Result Date: 10/23/2017 CLINICAL DATA:  Blunt trauma several months ago with increasing pain and soft tissue wound EXAM: RIGHT ANKLE - COMPLETE 3+ VIEW COMPARISON:  06/27/2017 FINDINGS: Increasing soft tissue wound is noted medially consistent with the given clinical history. Chronic changes of the tarsal bones are noted. Calcaneal spurring is noted. No definitive bony erosion is identified to suggest osteomyelitis. IMPRESSION: Enlarging soft tissue wound. Chronic degenerative changes similar to that seen on the prior exam. Electronically Signed   By: Inez Catalina M.D.   On: 10/23/2017 14:30   Assessment/Plan Principal Problem:   Cellulitis Active Problems:   Accelerated hypertension   Morbid obesity  (HCC)   Pulmonary hypertension (HCC)   HTN (hypertension)   CHF (congestive heart failure) (HCC)   CKD (chronic kidney disease) stage 4, GFR 15-29 ml/min (HCC)   Persistent atrial fibrillation (HCC)   Wound infection   Anemia in chronic kidney disease (CKD)   Gout  1. Cellulitis of skin surrounding neuropathic ulcer right ankle - IV antibiotics ordered, follow cultures, trend WBC, local wound care.  2. Leukocytosis - recheck CBC in AM.  3. 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 Arnoldo Morale and will treat with local wound care topical dacron solution wet to dry dressings, furthermore surgical debridement not indicated at this time, will ask for wound care nurse to consult.  Pt is already established with outpatient [facility] wound care center for ongoing wound management.  4. Chronic diastolic CHF - Pt appears fairly well compensated at this time and plan to resume her home diuretics and heart medications.  5. CKD stage 4 - Will follow.  Creatinine stable from recent testing.  Consult nephrology to see in AM.  6. Anemia in CKD - Monitor Hg.  It is down from recent test in May 2019.  Recheck CBC in AM.  7. Accelerated Hypertension - difficult to control, resume all of her home blood pressure medications.  8. Persistent atrial fibrillation - resume eliquis for anticoagulation. 9. Pulmonary hypertension - Pt had a sleep study but was determined to Not have sleep apnea.    DVT Prophylaxis: apixaban Code Status: Full   Family Communication: bedside  Disposition Plan: Home when medically stabilized   Time spent: 55 minutes  Irwin Brakeman, MD [facility] Pager [phone]  If 7PM-7AM, please contact night-coverage [email or web address] Password [facility] 10/23/2017, 3:29 PM

## 2017-10-23 NOTE — Progress Notes (Signed)
Pharmacy Antibiotic Note  [name] is a 48 y.o. female admitted on 10/23/2017 with diabetic food infection.  Pharmacy has been consulted for cefepime and vancomycin dosing.  Plan: Vancomycin 1500mg  IV every 24 hours.  Goal trough 15-20 mcg/mL. cefepime 2gm iv q24h      Temp (24hrs), Avg:98 F (36.7 C), Min:97.7 F (36.5 C), Max:98.2 F (36.8 C)  Recent Labs  Lab 10/23/17 1347  WBC 12.6*  CREATININE 2.74*    Estimated Creatinine Clearance: 36.4 mL/min (A) (by C-G formula based on SCr of 2.74 mg/dL (H)).    Allergies  Allergen Reactions  . Penicillins Hives and Other (See Comments)    Childhood allergy. Has patient had a PCN reaction causing immediate rash, facial/tongue/throat swelling, SOB or lightheadedness with hypotension: NO Has patient had a PCN reaction causing severe rash involving mucus membranes or skin necrosis: No NO Has patient had a PCN reaction that required hospitalization No NO Has patient had a PCN reaction occurring within the last 10 years: NO If all of the above answers are "NO", then may pro    Antimicrobials this admission: 8/28 cefepime>> 8/28 vancomycin >>   Microbiology results: 8/28 BCx: sent 8/28 ankle wound: sent   Thank you for allowing pharmacy to be a part of this patient's care.  Donna Christen [name] 10/23/2017 3:37 PM

## 2017-10-23 NOTE — ED Provider Notes (Signed)
[facility] EMERGENCY DEPARTMENT Provider Note   CSN: [id number] Arrival date & time: 10/23/17  1152     History   Chief Complaint Chief Complaint  Patient presents with  . Abscess    HPI [name] is a 47 y.o. female.  HPI Patient sent from clinic due to concern for infected right ankle wound.  She has had several months of a right medial ankle ulceration.  States over the last week she has had increased purulent discharge and foul smell coming from the ulcer.  She denies any fever or chills. Past Medical History:  Diagnosis Date  . Anemia   . Atrial flutter ([place])   . Cellulitis   . CHF (congestive heart failure) ([place])   . Chronic diastolic heart failure ([place])    a. Amyloidosis work-up negative 2013  b. 12/07/2014 ECHO EF 55-60%. LA severely dilated. RV normal RA moderately dilated, Severe LVH.IVC dilated.   . CKD (chronic kidney disease) stage 3, GFR 30-59 ml/min (HCC)   . Essential hypertension, benign   . Gout   . Gout   . History of cardiac catheterization    a. ectatic coronaries without obstruction 2006 - [place]  . Hypertensive heart disease   . Morbid obesity ([place])   . Persistent atrial fibrillation ([place])   . Pulmonary hypertension [facility])     Patient Active Problem List   Diagnosis Date Noted  . Cellulitis 10/23/2017  . CKD (chronic kidney disease) stage 4, GFR 15-29 ml/min (HCC) 10/23/2017  . Persistent atrial fibrillation ([place]) 10/23/2017  . Wound infection 10/23/2017  . Anemia in chronic kidney disease (CKD) 10/23/2017  . Gout 10/23/2017  . Acute pulmonary edema (HCC)   . CHF (congestive heart failure) ([place])   . Hypertensive emergency 06/26/2016  . Hypertensive urgency 06/26/2016  . Chronic diastolic heart failure ([place]) 02/06/2012  . HTN (hypertension) 02/06/2012  . Anemia 12/27/2011  . Pulmonary hypertension ([place]) 12/15/2011  . Cellulitis of left leg 12/15/2011  . Anasarca 12/10/2011  . Accelerated hypertension 12/10/2011  . Elevated serum  creatinine 12/10/2011  . Hyperbilirubinemia 12/10/2011  . Gout attack 12/10/2011  . Atrial fibrillation with controlled ventricular response ([place]) 12/10/2011  . Morbid obesity ([place]) 12/10/2011  . Acquired trigger finger 12/12/2010  . LATERAL EPICONDYLITIS 06/06/2009  . BURSITIS, OLECRANON 12/10/2007    Past Surgical History:  Procedure Laterality Date  . CARDIOVERSION  01/15/2012   Procedure: CARDIOVERSION;  Surgeon: Jolaine Artist, MD;  Location: [place];  Service: Cardiovascular;  Laterality: N/A;  . RIGHT HEART CATH N/A 02/13/2017   Procedure: RIGHT HEART CATH;  Surgeon: Jolaine Artist, MD;  Location: [place] CV LAB;  Service: Cardiovascular;  Laterality: N/A;  . RIGHT HEART CATHETERIZATION N/A 12/26/2011   Procedure: RIGHT HEART CATH;  Surgeon: Jolaine Artist, MD;  Location: [facility] CATH LAB;  Service: Cardiovascular;  Laterality: N/A;  . SKIN BIOPSY  12/18/2011   Procedure: BIOPSY SKIN;  Surgeon: Donato Heinz, MD;  Location: [facility] ORS;  Service: General;  Laterality: N/A;  in the minor room.  . TEE WITHOUT CARDIOVERSION  01/15/2012   Procedure: TRANSESOPHAGEAL ECHOCARDIOGRAM (TEE);  Surgeon: Jolaine Artist, MD;  Location: [facility] ENDOSCOPY;  Service: Cardiovascular;  Laterality: N/A;  cardioversion/on xarelto     OB History    Gravida      Para      Term      Preterm      AB      Living  0  SAB      TAB      Ectopic      Multiple      Live Births               Home Medications    Prior to Admission medications   Medication Sig Start Date End Date Taking? Authorizing Provider  amLODipine (NORVASC) 10 MG tablet TAKE 1 TABLET BY MOUTH DAILY. 09/23/17  Yes Bensimhon, Shaune Pascal, MD  apixaban (ELIQUIS) 5 MG TABS tablet Take 1 tablet (5 mg total) by mouth 2 (two) times daily. 11/23/16  Yes Jettie Booze E, NP  cloNIDine (CATAPRES) 0.3 MG tablet TAKE 1 TABLET BY MOUTH 2 TIMES DAILY. MAY TAKE AN ADDITIONAL TABLET AS NEEDED FOR SBP>180 09/23/17  Yes  Bensimhon, Shaune Pascal, MD  hydrALAZINE (APRESOLINE) 100 MG tablet Take 1 tablet (100 mg total) by mouth 3 (three) times daily. 05/16/17  Yes Shirley Friar, PA-C  potassium chloride SA (K-DUR,KLOR-CON) 20 MEQ tablet TAKE 1 TABLET BY MOUTH 2 TIMES DAILY. 09/23/17  Yes Bensimhon, Shaune Pascal, MD  spironolactone (ALDACTONE) 25 MG tablet Take 0.5 tablets (12.5 mg total) by mouth daily. 05/13/17  Yes Bensimhon, Shaune Pascal, MD  torsemide (DEMADEX) 20 MG tablet Take 2 tablets (40 mg total) by mouth 2 (two) times daily. Take extra 20 mg tablet once daily as needed for 3 lb weight gain. 07/31/17  Yes Clegg, Amy D, NP    Family History Family History  Problem Relation Age of Onset  . Hypertension Mother   . Diabetes Mother   . Heart attack Mother   . Heart attack Father   . Hypertension Sister   . Asthma Sister   . Hypertension Maternal Aunt   . Hypertension Maternal Uncle   . Hypertension Sister   . Cancer Sister        thyroid and Kidney cancer    Social History Social History   Tobacco Use  . Smoking status: Never Smoker  . Smokeless tobacco: Never Used  Substance Use Topics  . Alcohol use: No  . Drug use: No     Allergies   Penicillins   Review of Systems Review of Systems  Constitutional: Negative for chills, fatigue and fever.  HENT: Negative for sore throat and trouble swallowing.   Eyes: Negative for visual disturbance.  Respiratory: Negative for cough and shortness of breath.   Cardiovascular: Negative for chest pain.  Gastrointestinal: Negative for abdominal pain, diarrhea, nausea and vomiting.  Musculoskeletal: Negative for back pain and myalgias.  Skin: Positive for color change and wound.  Neurological: Negative for weakness, light-headedness, numbness and headaches.  All other systems reviewed and are negative.    Physical Exam Updated Vital Signs BP 140/88   Pulse 80   Temp 98.2 F (36.8 C) (Oral)   Resp 12   LMP 09/28/2017 (Approximate)   SpO2 98%    Physical Exam  Constitutional: She is oriented to person, place, and time. She appears well-developed and well-nourished. No distress.  HENT:  Head: Normocephalic and atraumatic.  Mouth/Throat: Oropharynx is clear and moist. No oropharyngeal exudate.  Eyes: Pupils are equal, round, and reactive to light. EOM are normal.  Neck: Normal range of motion. Neck supple. No JVD present.  Cardiovascular: Normal rate and regular rhythm. Exam reveals no gallop and no friction rub.  No murmur heard. Pulmonary/Chest: Effort normal and breath sounds normal. No stridor. No respiratory distress. She has no wheezes. She has no rales. She exhibits no tenderness.  Abdominal: Soft. Bowel sounds are normal. There is no tenderness. There is no rebound and no guarding.  Musculoskeletal: Normal range of motion. She exhibits edema and tenderness.  Distal pulses are 2+.  Lymphadenopathy:    She has no cervical adenopathy.  Neurological: She is alert and oriented to person, place, and time.  Skin: Skin is warm and dry. Capillary refill takes less than 2 seconds. No rash noted. She is not diaphoretic. There is erythema.  Patient with 10 x 11 cm right medial ankle ulceration with depth roughly 2 cm.  There is a large amount of purulent drainage coming from the wound.  Surrounding erythema and warmth to the mid calf and foot.  Psychiatric: She has a normal mood and affect. Her behavior is normal.  Nursing note and vitals reviewed.    ED Treatments / Results  Labs (all labs ordered are listed, but only abnormal results are displayed) Labs Reviewed  CBC WITH DIFFERENTIAL/PLATELET - Abnormal; Notable for the following components:      Result Value   WBC 12.6 (*)    RBC 2.79 (*)    Hemoglobin 8.4 (*)    HCT 27.6 (*)    Neutro Abs 10.6 (*)    All other components within normal limits  BASIC METABOLIC PANEL - Abnormal; Notable for the following components:   Chloride 115 (*)    CO2 16 (*)    Glucose, Bld 106  (*)    BUN 45 (*)    Creatinine, Ser 2.74 (*)    Calcium 8.5 (*)    GFR calc non Af Amer 20 (*)    GFR calc Af Amer 23 (*)    All other components within normal limits  AEROBIC CULTURE (SUPERFICIAL SPECIMEN)  CULTURE, BLOOD (ROUTINE X 2)  CULTURE, BLOOD (ROUTINE X 2)    EKG None  Radiology Dg Ankle Complete Right  Result Date: 10/23/2017 CLINICAL DATA:  Blunt trauma several months ago with increasing pain and soft tissue wound EXAM: RIGHT ANKLE - COMPLETE 3+ VIEW COMPARISON:  06/27/2017 FINDINGS: Increasing soft tissue wound is noted medially consistent with the given clinical history. Chronic changes of the tarsal bones are noted. Calcaneal spurring is noted. No definitive bony erosion is identified to suggest osteomyelitis. IMPRESSION: Enlarging soft tissue wound. Chronic degenerative changes similar to that seen on the prior exam. Electronically Signed   By: Inez Catalina M.D.   On: 10/23/2017 14:30    Procedures Procedures (including critical care time)  Medications Ordered in ED Medications  vancomycin (VANCOCIN) IVPB 1000 mg/200 mL premix (0 mg Intravenous Stopped 10/23/17 1455)  morphine 4 MG/ML injection 4 mg (4 mg Intravenous Given 10/23/17 1453)     Initial Impression / Assessment and Plan / ED Course  I have reviewed the triage vital signs and the nursing notes.  Pertinent labs & imaging results that were available during my care of the patient were reviewed by me and considered in my medical decision making (see chart for details).     Started on IV antibiotics and wound culture obtained.  Discussed with hospitalist who will admit.  Final Clinical Impressions(s) / ED Diagnoses   Final diagnoses:  Infected ulcer of skin, with unspecified severity [facility])    ED Discharge Orders    None       Julianne Rice, MD 10/23/17 [phone]

## 2017-10-24 ENCOUNTER — Inpatient Hospital Stay (HOSPITAL_COMMUNITY): Payer: Medicaid Other

## 2017-10-24 LAB — COMPREHENSIVE METABOLIC PANEL
ALBUMIN: 2.4 g/dL — AB (ref 3.5–5.0)
ALK PHOS: 86 U/L (ref 38–126)
ALT: 6 U/L (ref 0–44)
AST: 10 U/L — ABNORMAL LOW (ref 15–41)
Anion gap: 9 (ref 5–15)
BUN: 44 mg/dL — ABNORMAL HIGH (ref 6–20)
CALCIUM: 8.4 mg/dL — AB (ref 8.9–10.3)
CO2: 17 mmol/L — AB (ref 22–32)
CREATININE: 2.95 mg/dL — AB (ref 0.44–1.00)
Chloride: 115 mmol/L — ABNORMAL HIGH (ref 98–111)
GFR calc non Af Amer: 18 mL/min — ABNORMAL LOW (ref >60)
GFR, EST AFRICAN AMERICAN: 21 mL/min — AB (ref >60)
GLUCOSE: 98 mg/dL (ref 70–99)
Potassium: 4.6 mmol/L (ref 3.5–5.1)
SODIUM: 141 mmol/L (ref 135–145)
Total Bilirubin: 0.9 mg/dL (ref 0.3–1.2)
Total Protein: 8 g/dL (ref 6.5–8.1)

## 2017-10-24 LAB — CBC WITH DIFFERENTIAL/PLATELET
Basophils Absolute: 0 10*3/uL (ref 0.0–0.1)
Basophils Relative: 0 %
EOS ABS: 0.2 10*3/uL (ref 0.0–0.7)
EOS PCT: 2 %
HCT: 25.5 % — ABNORMAL LOW (ref 36.0–46.0)
Hemoglobin: 7.9 g/dL — ABNORMAL LOW (ref 12.0–15.0)
LYMPHS ABS: 0.7 10*3/uL (ref 0.7–4.0)
Lymphocytes Relative: 6 %
MCH: 30.7 pg (ref 26.0–34.0)
MCHC: 31 g/dL (ref 30.0–36.0)
MCV: 99.2 fL (ref 78.0–100.0)
MONO ABS: 1 10*3/uL (ref 0.1–1.0)
MONOS PCT: 9 %
Neutro Abs: 9.3 10*3/uL — ABNORMAL HIGH (ref 1.7–7.7)
Neutrophils Relative %: 83 %
PLATELETS: 289 10*3/uL (ref 150–400)
RBC: 2.57 MIL/uL — ABNORMAL LOW (ref 3.87–5.11)
RDW: 13.9 % (ref 11.5–15.5)
WBC: 11.3 10*3/uL — ABNORMAL HIGH (ref 4.0–10.5)

## 2017-10-24 LAB — MAGNESIUM: Magnesium: 1.7 mg/dL (ref 1.7–2.4)

## 2017-10-24 LAB — HIV ANTIBODY (ROUTINE TESTING W REFLEX): HIV Screen 4th Generation wRfx: NONREACTIVE

## 2017-10-24 MED ORDER — DAKINS (1/4 STRENGTH) 0.125 % EX SOLN
Freq: Two times a day (BID) | CUTANEOUS | Status: DC
  Administered 2017-10-24 – 2017-10-26 (×5)
  Filled 2017-10-24: qty 473

## 2017-10-24 MED ORDER — OXYCODONE-ACETAMINOPHEN 5-325 MG PO TABS
1.0000 | ORAL_TABLET | ORAL | Status: DC | PRN
  Administered 2017-10-24 – 2017-10-25 (×8): 1 via ORAL
  Administered 2017-10-26: 2 via ORAL
  Administered 2017-10-26: 1 via ORAL
  Administered 2017-10-26: 2 via ORAL
  Administered 2017-10-26: 1 via ORAL
  Filled 2017-10-24: qty 1
  Filled 2017-10-24: qty 2
  Filled 2017-10-24 (×7): qty 1
  Filled 2017-10-24: qty 2
  Filled 2017-10-24 (×2): qty 1

## 2017-10-24 MED ORDER — PRO-STAT SUGAR FREE PO LIQD
30.0000 mL | Freq: Two times a day (BID) | ORAL | Status: DC
  Administered 2017-10-24 – 2017-10-26 (×5): 30 mL via ORAL
  Filled 2017-10-24 (×5): qty 30

## 2017-10-24 MED ORDER — ADULT MULTIVITAMIN W/MINERALS CH
1.0000 | ORAL_TABLET | Freq: Every day | ORAL | Status: DC
  Administered 2017-10-24 – 2017-10-26 (×3): 1 via ORAL
  Filled 2017-10-24 (×3): qty 1

## 2017-10-24 MED ORDER — SODIUM BICARBONATE 650 MG PO TABS
650.0000 mg | ORAL_TABLET | Freq: Two times a day (BID) | ORAL | Status: DC
  Administered 2017-10-24 – 2017-10-26 (×4): 650 mg via ORAL
  Filled 2017-10-24 (×4): qty 1

## 2017-10-24 MED ORDER — JUVEN PO PACK
1.0000 | PACK | Freq: Two times a day (BID) | ORAL | Status: DC
  Administered 2017-10-24 – 2017-10-26 (×5): 1 via ORAL
  Filled 2017-10-24 (×5): qty 1

## 2017-10-24 NOTE — Consult Note (Signed)
[place] Nurse wound consult note Non-healing right ankle wound.  Photos and notes by Dr. Wynetta Emery reviewed.  Orders entered for twice daily 1/4 strength Dakin's solution.  This is indicated in Dr. Durenda Age note as the preferred treatment option at this time. Monitor the wound area(s) for worsening of condition such as: Signs/symptoms of infection,  Increase in size,  Development of or worsening of odor, Development of pain, or increased pain at the affected locations.  Notify the medical team if any of these develop.  Thank you for the consult.  Discussed plan of care with the bedside nurse, Sonia Baller.  [place] nurse will not follow at this time.  Please re-consult the [place] team if needed.  Val Riles, RN, MSN, CWOCN, CNS-BC, pager [phone]

## 2017-10-24 NOTE — Care Management Note (Signed)
Case Management Note  Patient Details  Name: CLEON THOMA MRN: [id number] Date of Birth: December 07, 1970  Subjective/Objective:        Admitted with cellulitis. Pt from home, lives alone, ind with ADL's. Pt has no insurance, goes to the [facility] for care and is unemployed. Pt has had wound to leg since beginning of this year. It has only gotten to be this bad the past 1-2 months. Pt has been going to OP Wound center at [facility]. Has next appointment sept 10th.             Action/Plan: Pt will DC home with self care and f/u with wound center. Pt will need assistance with cost once decision is made on what abx to use.   Expected Discharge Date:    10/25/17              Expected Discharge Plan:  Home/Self Care  In-House Referral:  NA  Discharge planning Services  CM Consult  Post Acute Care Choice:  NA Choice offered to:  NA  DME Arranged:    DME Agency:     HH Arranged:    HH Agency:     Status of Service:  In process, will continue to follow  If discussed at Long Length of Stay Meetings, dates discussed:    Additional Comments:  Sherald Barge, RN 10/24/2017, 1:35 PM

## 2017-10-24 NOTE — Progress Notes (Signed)
PROGRESS NOTE    [name]  ERD:[id number] DOB: 1971/01/17 DOA: 10/23/2017 PCP: Soyla Dryer, PA-C    Brief Narrative:  47 year old female with a history of chronic diastolic CHF, atrial fibrillation, chronic kidney disease, was sent to the emergency room from her primary care physician's office for worsening lower extremity wound.  This is been present for almost 6 months, and has gradually gotten worse.  It was felt to be surrounding cellulitis and she was started on intravenous antibiotics.   Assessment & Plan:   Principal Problem:   Cellulitis Active Problems:   Accelerated hypertension   Morbid obesity (HCC)   Pulmonary hypertension (HCC)   HTN (hypertension)   CHF (congestive heart failure) (HCC)   CKD (chronic kidney disease) stage 4, GFR 15-29 ml/min (HCC)   Persistent atrial fibrillation (HCC)   Wound infection   Anemia in chronic kidney disease (CKD)   Gout   1. Cellulitis of right lower extremity.  Currently on intravenous antibiotics.  Overall erythema appears to be improving.  WBC trending down.  Continue current treatments. 2. Chronic ulcer of right lower leg.  Patient follows with the wound center as an outpatient and reports his wound is been present for almost 6 months.  Continue wound care services.  She will need close follow-up.   3. Chronic diastolic CHF.  Appears compensated.  Continue home medications. 4. Chronic kidney disease stage IV.  Creatinine is slowly trending up.  Will hold further diuretics for now.  Recheck in a.m. 5. Anemia chronic kidney disease.  Hemoglobin has been trending down.  No obvious signs of bleeding.  Possibly related to underlying wound.  Recheck in a.m.  She does not describe any melena or hematochezia 6. Persistent atrial fibrillation.  Anticoagulated with Eliquis.  Continue beta-blockers.   DVT prophylaxis: Apixaban Code Status: Full code Family Communication: Discussed with family at the bedside Disposition Plan:  Discharge home with home health services once stable   Consultants:     Procedures:     Antimicrobials:   Vancomycin 8/28 >  Cefepime 8/28 >  Flagyl 8/28 >   Subjective: No shortness of breath.  1 right lower extremity remains painful.  She feels that this may be slowly improving.  Objective: Vitals:   10/23/17 2209 10/24/17 0542 10/24/17 1006 10/24/17 1543  BP:  (!) 149/87 (!) 148/92 135/74  Pulse:  96 81 79  Resp:  18  18  Temp:  99.1 F (37.3 C)  97.6 F (36.4 C)  TempSrc:  Oral  Oral  SpO2: 100% 98%  97%  Weight:      Height:        Intake/Output Summary (Last 24 hours) at 10/24/2017 1732 Last data filed at 10/24/2017 1655 Gross per 24 hour  Intake 1758.17 ml  Output -  Net 1758.17 ml   Filed Weights   10/23/17 1710  Weight: 120.7 kg    Examination:  General exam: Appears calm and comfortable  Respiratory system: Clear to auscultation. Respiratory effort normal. Cardiovascular system: S1 & S2 heard, RRR. No JVD, murmurs, rubs, gallops or clicks. Gastrointestinal system: Abdomen is nondistended, soft and nontender. No organomegaly or masses felt. Normal bowel sounds heard. Central nervous system: Alert and oriented. No focal neurological deficits. Extremities: 1-2+ lower extremity edema (chronic) Skin: Large wound on medial aspect of right lower leg.  Base with pink granulation tissue noted.  Margins appear to have some necrotic tissue.  Overall erythema around the wound is better. Psychiatry: Judgement and insight  appear normal. Mood & affect appropriate.     Data Reviewed: I have personally reviewed following labs and imaging studies  CBC: Recent Labs  Lab 10/23/17 1347 10/24/17 0525  WBC 12.6* 11.3*  NEUTROABS 10.6* 9.3*  HGB 8.4* 7.9*  HCT 27.6* 25.5*  MCV 98.9 99.2  PLT 297 539   Basic Metabolic Panel: Recent Labs  Lab 10/23/17 1347 10/24/17 0525  NA 139 141  K 4.3 4.6  CL 115* 115*  CO2 16* 17*  GLUCOSE 106* 98  BUN 45* 44*   CREATININE 2.74* 2.95*  CALCIUM 8.5* 8.4*  MG  --  1.7   GFR: Estimated Creatinine Clearance: 34.5 mL/min (A) (by C-G formula based on SCr of 2.95 mg/dL (H)). Liver Function Tests: Recent Labs  Lab 10/24/17 0525  AST 10*  ALT 6  ALKPHOS 86  BILITOT 0.9  PROT 8.0  ALBUMIN 2.4*   No results for input(s): LIPASE, AMYLASE in the last 168 hours. No results for input(s): AMMONIA in the last 168 hours. Coagulation Profile: No results for input(s): INR, PROTIME in the last 168 hours. Cardiac Enzymes: No results for input(s): CKTOTAL, CKMB, CKMBINDEX, TROPONINI in the last 168 hours. BNP (last 3 results) No results for input(s): PROBNP in the last 8760 hours. HbA1C: No results for input(s): HGBA1C in the last 72 hours. CBG: No results for input(s): GLUCAP in the last 168 hours. Lipid Profile: No results for input(s): CHOL, HDL, LDLCALC, TRIG, CHOLHDL, LDLDIRECT in the last 72 hours. Thyroid Function Tests: No results for input(s): TSH, T4TOTAL, FREET4, T3FREE, THYROIDAB in the last 72 hours. Anemia Panel: No results for input(s): VITAMINB12, FOLATE, FERRITIN, TIBC, IRON, RETICCTPCT in the last 72 hours. Sepsis Labs: No results for input(s): PROCALCITON, LATICACIDVEN in the last 168 hours.  Recent Results (from the past 240 hour(s))  Wound or Superficial Culture     Status: None (Preliminary result)   Collection Time: 10/23/17  2:01 PM  Result Value Ref Range Status   Specimen Description   Final    ANKLE Performed at [facility], [address], [place], [place] [address]    Special Requests   Final    NONE Performed at [facility], [address] [place], [place] [address]    Gram Stain   Final    RARE WBC PRESENT, PREDOMINANTLY PMN ABUNDANT GRAM POSITIVE COCCI ABUNDANT GRAM NEGATIVE RODS MODERATE GRAM POSITIVE RODS    Culture   Final    CULTURE REINCUBATED FOR BETTER GROWTH Performed at [facility], [place] [address], [place], [place] [address]    Report  Status PENDING  Incomplete  Culture, blood (Routine X 2) w Reflex to ID Panel     Status: None (Preliminary result)   Collection Time: 10/23/17  3:22 PM  Result Value Ref Range Status   Specimen Description BLOOD RIGHT HAND  Final   Special Requests   Final    BOTTLES DRAWN AEROBIC AND ANAEROBIC Blood Culture adequate volume   Culture   Final    NO GROWTH < 24 HOURS Performed at [facility], [address], [place], [place] [address]    Report Status PENDING  Incomplete  Culture, blood (Routine X 2) w Reflex to ID Panel     Status: None (Preliminary result)   Collection Time: 10/23/17  5:36 PM  Result Value Ref Range Status   Specimen Description BLOOD LEFT HAND  Final   Special Requests   Final    BOTTLES DRAWN AEROBIC AND ANAEROBIC  Blood Culture adequate volume   Culture   Final    NO GROWTH < 24 HOURS Performed at [facility], [address] [place], [place] [address]    Report Status PENDING  Incomplete         Radiology Studies: Dg Ankle Complete Right  Result Date: 10/23/2017 CLINICAL DATA:  Blunt trauma several months ago with increasing pain and soft tissue wound EXAM: RIGHT ANKLE - COMPLETE 3+ VIEW COMPARISON:  06/27/2017 FINDINGS: Increasing soft tissue wound is noted medially consistent with the given clinical history. Chronic changes of the tarsal bones are noted. Calcaneal spurring is noted. No definitive bony erosion is identified to suggest osteomyelitis. IMPRESSION: Enlarging soft tissue wound. Chronic degenerative changes similar to that seen on the prior exam. Electronically Signed   By: Inez Catalina M.D.   On: 10/23/2017 14:30   US Arterial Abi (screening Lower Extremity)  Result Date: 10/24/2017 CLINICAL DATA:  47 year old female with a history of a wound. Cardiovascular risk factors include hypertension EXAM: NONINVASIVE PHYSIOLOGIC VASCULAR STUDY OF BILATERAL LOWER EXTREMITIES TECHNIQUE: Evaluation of both lower extremities was performed at rest, including  calculation of ankle-brachial indices, multiple segmental pressure evaluation, segmental Doppler and segmental pulse volume recording. COMPARISON:  None. FINDINGS: Right ABI:  1.16 Left ABI:  1.08 Right Lower Extremity: Segmental Doppler at the right lower extremity demonstrates monophasic waveform of the tibial arteries Left Lower Extremity: Segmental Doppler at the left lower extremity demonstrates biphasic waveform. IMPRESSION: Right: Resting ABI within normal limits, however, this is likely falsely elevated, as the waveform demonstrates significant arterial occlusive disease, at least of the tibial arteries. Left: Resting ABI within normal limits. Segmental exam demonstrates biphasic waveforms at the left ankle. Signed, Dulcy Fanny. Dellia Nims, RPVI [facility] Radiology Electronically Signed   By: Corrie Mckusick D.O.   On: 10/24/2017 14:26        Scheduled Meds: . amLODipine  10 mg Oral Daily  . apixaban  5 mg Oral BID  . cloNIDine  0.3 mg Oral BID  . feeding supplement (PRO-STAT SUGAR FREE 64)  30 mL Oral BID  . hydrALAZINE  100 mg Oral TID  . metroNIDAZOLE  500 mg Oral Q8H  . multivitamin with minerals  1 tablet Oral Daily  . nutrition supplement (JUVEN)  1 packet Oral BID BM  . potassium chloride SA  20 mEq Oral BID  . senna-docusate  1 tablet Oral QHS  . sodium chloride flush  3 mL Intravenous Q12H  . sodium hypochlorite   Irrigation BID  . spironolactone  12.5 mg Oral Daily  . torsemide  40 mg Oral BID   Continuous Infusions: . sodium chloride 10 mL/hr at 10/24/17 0400  . ceFEPime (MAXIPIME) IV Stopped (10/24/17 1655)  . vancomycin Stopped (10/24/17 1556)     LOS: 1 day    Time spent: 9mins    Kathie Dike, MD [facility] Pager [phone]  If 7PM-7AM, please contact night-coverage [email or web address] Password [facility] 10/24/2017, 5:32 PM

## 2017-10-24 NOTE — Progress Notes (Signed)
Initial Nutrition Assessment  DOCUMENTATION CODES:  Obesity unspecified  INTERVENTION:  Will order 30 mL Prostat BID, each supplement provides 100 kcal and 15 grams of protein.  Will order Juven supplement which contains CaHMB, Arginine, Glutamine and collagen to promote wound healing/tissue granulation.   Discussed patient food insecurity/barriers with SW-pt seeking food stamps  B12 in May was 267-may benefit from recheck as she reports not supplementing w/ any vitamins at home.   NUTRITION DIAGNOSIS:  Increased nutrient needs related to wound healing as evidenced by estimated nutritional requirements for this condition.   GOAL:  Patient will meet greater than or equal to 90% of their needs   MONITOR:  PO intake, Supplement acceptance, Labs, Weight trends, I & O's  REASON FOR ASSESSMENT:  Consult Wound healing  ASSESSMENT:  47 y/o female PMHx CHF, htn, CKD4, Afib, Morbid obesity, noncompliance and wound to R ankle for which she is seen at wound clinic, though has missed recent visits. Referred to ED by PCP for worsening R ankle wound w/ concern for infection. Admitted for Cellulitis. RD consulted for wound healing.   Patient notes considerable pain in ankle on RD arrival.   RD noted from pre-visit chart review that patient appears to have food insecurity as she has had financial/transportation barriers preventing her from getting to wound clinic appointments and had noted she only showed up to one appt to get documentation needed for food stamp eligibility.   She says today that she still is seeking food stamp assistance. She says "I need a letter from a doctor saying I cannot work". She says she gets her medications from [facility] in [place]. She gets checkups through the "family planning" program.  RD passed this information to SW  She does say she is NOT going hungry as her sister provides her with needed food assistance. She says she only eats 2x a day, but this is  out of habit, not financial restraint. She eats Yogurt/cereal in morning, skips lunch, and eats a full meal for dinner. She denies using salt shaker and says she gave this up a long time ago. She does not take any nutritional supplements or vitamins.   She does not know her UBW. She says "200 something". Per chart, she was 285.8 lbs in May. She was admitted yesterday at 266 lbs. This loss of 6.6% bw in 3 months is not significant for malnutrition criteria. Long term, she does appear to be losing weight. Unsure if intentional. She was ~315 lbs at this time last year, a loss of 15.5% x 1 year, again not significant for malnutrition criteria. Actually appears to have been >400 lbs 6 years ago.   At this time, she is agreeable to RD adding supplements/vitamins for wound healing. She reports a good appetite. Only complaint is pain. No n/v/c/d. Reviewed importance of choosing protein containing items at meals. Verbalizes understanding.   Labs: BUN/Creat:44/2.95-up x3 months, Albumin:2.4, WBC:11.4, B12 in March was 267-may benefit from recheck as she has not been supplementing w/ any vitamins at home.  Meds: Kcl, PO abx, IV abx, Aldactone, Demadex, Senna,   NUTRITION - FOCUSED PHYSICAL EXAM:   Most Recent Value  Orbital Region  No depletion  Upper Arm Region  No depletion  Thoracic and Lumbar Region  No depletion  Buccal Region  No depletion  Temple Region  No depletion  Clavicle Bone Region  No depletion  Clavicle and Acromion Bone Region  No depletion  Scapular Bone Region  Unable to  assess  Patellar Region  No depletion  Anterior Thigh Region  Unable to assess  Posterior Calf Region  No depletion  Edema (RD Assessment)  None     Diet Order:   Diet Order            Diet Heart Room service appropriate? Yes; Fluid consistency: Thin  Diet effective now             EDUCATION NEEDS:  No education needs have been identified at this time  Skin:  Large necrotic Wound to R medial Ankle.    Last BM:  8/27   Height:  Ht Readings from Last 1 Encounters:  10/23/17 5\' 11"  (1.803 m)   Weight:  Wt Readings from Last 1 Encounters:  10/23/17 120.7 kg   Wt Readings from Last 10 Encounters:  10/23/17 120.7 kg  10/23/17 122 kg  07/31/17 130.2 kg  07/11/17 130.4 kg  07/10/17 129.6 kg  06/27/17 125.6 kg  06/27/17 125.9 kg  05/16/17 128.4 kg  03/06/17 121.9 kg  02/13/17 117.9 kg   Ideal Body Weight:  70.45 kg  BMI:  Body mass index is 37.11 kg/m.  Estimated Nutritional Needs:  Kcal:  2100-2250 kacls (30-32 kcal/kg ibw) Protein:  105-120g Pro (1.5-1.7g/kg IBW) Fluid:  Per MD FLuid goals  Burtis Junes RD, LDN, CNSC Clinical Nutrition Available Tues-Sat via Pager: [phone] 10/24/2017 12:48 PM

## 2017-10-24 NOTE — Clinical Social Work Note (Addendum)
Received CSW consult for medication assistance at dc. Discussed with RN CM, Janett Billow, who is aware and will address this need. Will clear CSW consult.   12:32  Notified by RD that pt is in need of community resource information for food insecurity. Met with pt and provided verbal and written information on local food bank options and Social Services contact information. Pt indicated that she did not have any other needs at this time.

## 2017-10-25 DIAGNOSIS — L98492 Non-pressure chronic ulcer of skin of other sites with fat layer exposed: Secondary | ICD-10-CM

## 2017-10-25 LAB — COMPREHENSIVE METABOLIC PANEL
ALK PHOS: 86 U/L (ref 38–126)
ALT: 7 U/L (ref 0–44)
ANION GAP: 9 (ref 5–15)
AST: 11 U/L — ABNORMAL LOW (ref 15–41)
Albumin: 2.5 g/dL — ABNORMAL LOW (ref 3.5–5.0)
BILIRUBIN TOTAL: 0.9 mg/dL (ref 0.3–1.2)
BUN: 52 mg/dL — AB (ref 6–20)
CO2: 18 mmol/L — ABNORMAL LOW (ref 22–32)
Calcium: 8.4 mg/dL — ABNORMAL LOW (ref 8.9–10.3)
Chloride: 113 mmol/L — ABNORMAL HIGH (ref 98–111)
Creatinine, Ser: 3.07 mg/dL — ABNORMAL HIGH (ref 0.44–1.00)
GFR calc Af Amer: 20 mL/min — ABNORMAL LOW (ref >60)
GFR, EST NON AFRICAN AMERICAN: 17 mL/min — AB (ref >60)
Glucose, Bld: 95 mg/dL (ref 70–99)
POTASSIUM: 5 mmol/L (ref 3.5–5.1)
Sodium: 140 mmol/L (ref 135–145)
TOTAL PROTEIN: 8.1 g/dL (ref 6.5–8.1)

## 2017-10-25 LAB — CBC WITH DIFFERENTIAL/PLATELET
BASOS PCT: 0 %
Basophils Absolute: 0 10*3/uL (ref 0.0–0.1)
Eosinophils Absolute: 0.4 10*3/uL (ref 0.0–0.7)
Eosinophils Relative: 3 %
HEMATOCRIT: 27 % — AB (ref 36.0–46.0)
Hemoglobin: 8.2 g/dL — ABNORMAL LOW (ref 12.0–15.0)
Lymphocytes Relative: 12 %
Lymphs Abs: 1.2 10*3/uL (ref 0.7–4.0)
MCH: 29.9 pg (ref 26.0–34.0)
MCHC: 30.4 g/dL (ref 30.0–36.0)
MCV: 98.5 fL (ref 78.0–100.0)
MONO ABS: 1 10*3/uL (ref 0.1–1.0)
MONOS PCT: 10 %
NEUTROS ABS: 7.8 10*3/uL — AB (ref 1.7–7.7)
NEUTROS PCT: 75 %
Platelets: 306 10*3/uL (ref 150–400)
RBC: 2.74 MIL/uL — AB (ref 3.87–5.11)
RDW: 13.9 % (ref 11.5–15.5)
WBC: 10.3 10*3/uL (ref 4.0–10.5)

## 2017-10-25 LAB — MAGNESIUM: MAGNESIUM: 1.6 mg/dL — AB (ref 1.7–2.4)

## 2017-10-25 MED ORDER — SODIUM CHLORIDE 0.45 % IV SOLN
INTRAVENOUS | Status: DC
  Administered 2017-10-25: 19:00:00 via INTRAVENOUS

## 2017-10-25 MED ORDER — DOXYCYCLINE HYCLATE 100 MG PO TABS
100.0000 mg | ORAL_TABLET | Freq: Two times a day (BID) | ORAL | Status: DC
  Administered 2017-10-25 – 2017-10-26 (×2): 100 mg via ORAL
  Filled 2017-10-25 (×2): qty 1

## 2017-10-25 MED ORDER — MAGNESIUM SULFATE 2 GM/50ML IV SOLN
2.0000 g | Freq: Once | INTRAVENOUS | Status: AC
  Administered 2017-10-25: 2 g via INTRAVENOUS
  Filled 2017-10-25: qty 50

## 2017-10-25 NOTE — Consult Note (Signed)
Reason for Consult: Right ankle ulceration Referring Physician: Dr. Otilio Saber is an 47 y.o. female.  HPI: Patient is a 47 year old black female with multiple medical problems who presented to the hospital for IV antibiotics due to a necrotic skin wound just above her right ankle.  Is been present for some time now.  It seemed to worsen recently and the patient was referred to the emergency room for further treatment.  Pictures are noted on the chart.  Patient is a poor historian.  She has been noncompliant with her medical care.  She does have some pain when you touch it.  Past Medical History:  Diagnosis Date  . Anemia   . Atrial flutter ([place])   . Cellulitis   . CHF (congestive heart failure) ([place])   . Chronic diastolic heart failure ([place])    a. Amyloidosis work-up negative 2013  b. 12/07/2014 ECHO EF 55-60%. LA severely dilated. RV normal RA moderately dilated, Severe LVH.IVC dilated.   . CKD (chronic kidney disease) stage 3, GFR 30-59 ml/min ([facility])   . Essential hypertension, benign   . Gout   . Gout   . History of cardiac catheterization    a. ectatic coronaries without obstruction 2006 - [place]  . Hypertensive heart disease   . Morbid obesity ([place])   . Persistent atrial fibrillation ([place])   . Pulmonary hypertension ([place])     Past Surgical History:  Procedure Laterality Date  . CARDIOVERSION  01/15/2012   Procedure: CARDIOVERSION;  Surgeon: Jolaine Artist, MD;  Location: [facility];  Service: Cardiovascular;  Laterality: N/A;  . RIGHT HEART CATH N/A 02/13/2017   Procedure: RIGHT HEART CATH;  Surgeon: Jolaine Artist, MD;  Location: [facility];  Service: Cardiovascular;  Laterality: N/A;  . RIGHT HEART CATHETERIZATION N/A 12/26/2011   Procedure: RIGHT HEART CATH;  Surgeon: Jolaine Artist, MD;  [facility];  Service: Cardiovascular;  Laterality: N/A;  . SKIN BIOPSY  12/18/2011   Procedure: BIOPSY SKIN;  Surgeon: Donato Heinz, MD;   Location: [facility];  Service: General;  Laterality: N/A;  in the minor room.  . TEE WITHOUT CARDIOVERSION  01/15/2012   Procedure: TRANSESOPHAGEAL ECHOCARDIOGRAM (TEE);  Surgeon: Jolaine Artist, MD;  Location: [facility] ENDOSCOPY;  Service: Cardiovascular;  Laterality: N/A;  cardioversion/on xarelto    Family History  Problem Relation Age of Onset  . Hypertension Mother   . Diabetes Mother   . Heart attack Mother   . Heart attack Father   . Hypertension Sister   . Asthma Sister   . Hypertension Maternal Aunt   . Hypertension Maternal Uncle   . Hypertension Sister   . Cancer Sister        thyroid and Kidney cancer    Social History:  reports that she has never smoked. She has never used smokeless tobacco. She reports that she does not drink alcohol or use drugs.  Allergies:  Allergies  Allergen Reactions  . Penicillins Hives and Other (See Comments)    Childhood allergy. Has patient had a PCN reaction causing immediate rash, facial/tongue/throat swelling, SOB or lightheadedness with hypotension: NO Has patient had a PCN reaction causing severe rash involving mucus membranes or skin necrosis: No NO Has patient had a PCN reaction that required hospitalization No NO Has patient had a PCN reaction occurring within the last 10 years: NO If all of the above answers are "NO", then may pro    Medications: I have reviewed  the patient's current medications.  Results for orders placed or performed during the hospital encounter of 10/23/17 (from the past 48 hour(s))  CBC with Differential/Platelet     Status: Abnormal   Collection Time: 10/23/17  1:47 PM  Result Value Ref Range   WBC 12.6 (H) 4.0 - 10.5 K/uL   RBC 2.79 (L) 3.87 - 5.11 MIL/uL   Hemoglobin 8.4 (L) 12.0 - 15.0 g/dL   HCT 27.6 (L) 36.0 - 46.0 %   MCV 98.9 78.0 - 100.0 fL   MCH 30.1 26.0 - 34.0 pg   MCHC 30.4 30.0 - 36.0 g/dL   RDW 13.8 11.5 - 15.5 %   Platelets 297 150 - 400 K/uL   Neutrophils Relative % 84 %   Neutro Abs  10.6 (H) 1.7 - 7.7 K/uL   Lymphocytes Relative 8 %   Lymphs Abs 1.0 0.7 - 4.0 K/uL   Monocytes Relative 7 %   Monocytes Absolute 0.8 0.1 - 1.0 K/uL   Eosinophils Relative 1 %   Eosinophils Absolute 0.1 0.0 - 0.7 K/uL   Basophils Relative 0 %   Basophils Absolute 0.0 0.0 - 0.1 K/uL    Comment: Performed at [facility], [address], [place], [place] [address]  Basic metabolic panel     Status: Abnormal   Collection Time: 10/23/17  1:47 PM  Result Value Ref Range   Sodium 139 135 - 145 mmol/L   Potassium 4.3 3.5 - 5.1 mmol/L   Chloride 115 (H) 98 - 111 mmol/L   CO2 16 (L) 22 - 32 mmol/L   Glucose, Bld 106 (H) 70 - 99 mg/dL   BUN 45 (H) 6 - 20 mg/dL   Creatinine, Ser 2.74 (H) 0.44 - 1.00 mg/dL   Calcium 8.5 (L) 8.9 - 10.3 mg/dL   GFR calc non Af Amer 20 (L) >60 mL/min   GFR calc Af Amer 23 (L) >60 mL/min    Comment: (NOTE) The eGFR has been calculated using the CKD EPI equation. This calculation has not been validated in all clinical situations. eGFR's persistently <60 mL/min signify possible Chronic Kidney Disease.    Anion gap 8 5 - 15    Comment: Performed at [facility], [address], [place], [place] [address]  Wound or Superficial Culture     Status: None (Preliminary result)   Collection Time: 10/23/17  2:01 PM  Result Value Ref Range   Specimen Description      ANKLE Performed at [facility], [address], [place], [place] [address]    Special Requests      NONE Performed at [facility], [address] [place], [place] [address]    Gram Stain      RARE WBC PRESENT, PREDOMINANTLY PMN ABUNDANT GRAM POSITIVE COCCI ABUNDANT GRAM NEGATIVE RODS MODERATE GRAM POSITIVE RODS    Culture      ABUNDANT GRAM NEGATIVE RODS IDENTIFICATION AND SUSCEPTIBILITIES TO FOLLOW Performed at [facility], [place] [address] [place], [place] [address]    Report Status PENDING   Culture, blood (Routine X 2) w Reflex to ID Panel     Status: None (Preliminary result)    Collection Time: 10/23/17  3:22 PM  Result Value Ref Range   Specimen Description BLOOD RIGHT HAND    Special Requests      BOTTLES DRAWN AEROBIC AND ANAEROBIC Blood Culture adequate volume   Culture      NO GROWTH 2 DAYS Performed at [facility], [place]  [address], [place], [place] [address]    Report Status PENDING   Culture, blood (Routine X 2) w Reflex to ID Panel     Status: None (Preliminary result)   Collection Time: 10/23/17  5:36 PM  Result Value Ref Range   Specimen Description BLOOD LEFT HAND    Special Requests      BOTTLES DRAWN AEROBIC AND ANAEROBIC Blood Culture adequate volume   Culture      NO GROWTH 2 DAYS Performed at [facility], [address] [place], [place] [address]    Report Status PENDING   HIV antibody (Routine Testing)     Status: None   Collection Time: 10/23/17  5:53 PM  Result Value Ref Range   HIV Screen 4th Generation wRfx Non Reactive Non Reactive    Comment: (NOTE) Performed At: [facility] [place], [place] [id number] Rush Farmer MD [id number]   Comprehensive metabolic panel     Status: Abnormal   Collection Time: 10/24/17  5:25 AM  Result Value Ref Range   Sodium 141 135 - 145 mmol/L   Potassium 4.6 3.5 - 5.1 mmol/L   Chloride 115 (H) 98 - 111 mmol/L   CO2 17 (L) 22 - 32 mmol/L   Glucose, Bld 98 70 - 99 mg/dL   BUN 44 (H) 6 - 20 mg/dL   Creatinine, Ser 2.95 (H) 0.44 - 1.00 mg/dL   Calcium 8.4 (L) 8.9 - 10.3 mg/dL   Total Protein 8.0 6.5 - 8.1 g/dL   Albumin 2.4 (L) 3.5 - 5.0 g/dL   AST 10 (L) 15 - 41 U/L   ALT 6 0 - 44 U/L   Alkaline Phosphatase 86 38 - 126 U/L   Total Bilirubin 0.9 0.3 - 1.2 mg/dL   GFR calc non Af Amer 18 (L) >60 mL/min   GFR calc Af Amer 21 (L) >60 mL/min    Comment: (NOTE) The eGFR has been calculated using the CKD EPI equation. This calculation has not been validated in all clinical situations. eGFR's persistently <60 mL/min signify possible Chronic Kidney Disease.    Anion  gap 9 5 - 15    Comment: Performed at [facility], [address] [place], [place] [address]  Magnesium     Status: None   Collection Time: 10/24/17  5:25 AM  Result Value Ref Range   Magnesium 1.7 1.7 - 2.4 mg/dL    Comment: Performed at [facility], [address] [place], [place] [address]  CBC WITH DIFFERENTIAL     Status: Abnormal   Collection Time: 10/24/17  5:25 AM  Result Value Ref Range   WBC 11.3 (H) 4.0 - 10.5 K/uL   RBC 2.57 (L) 3.87 - 5.11 MIL/uL   Hemoglobin 7.9 (L) 12.0 - 15.0 g/dL   HCT 25.5 (L) 36.0 - 46.0 %   MCV 99.2 78.0 - 100.0 fL   MCH 30.7 26.0 - 34.0 pg   MCHC 31.0 30.0 - 36.0 g/dL   RDW 13.9 11.5 - 15.5 %   Platelets 289 150 - 400 K/uL   Neutrophils Relative % 83 %   Neutro Abs 9.3 (H) 1.7 - 7.7 K/uL   Lymphocytes Relative 6 %   Lymphs Abs 0.7 0.7 - 4.0 K/uL   Monocytes Relative 9 %   Monocytes Absolute 1.0 0.1 - 1.0 K/uL   Eosinophils Relative 2 %   Eosinophils Absolute 0.2 0.0 - 0.7 K/uL   Basophils Relative 0 %   Basophils Absolute 0.0 0.0 -  0.1 K/uL    Comment: Performed at [facility], [address] [place], [place] [address]  Comprehensive metabolic panel     Status: Abnormal   Collection Time: 10/25/17  6:24 AM  Result Value Ref Range   Sodium 140 135 - 145 mmol/L   Potassium 5.0 3.5 - 5.1 mmol/L   Chloride 113 (H) 98 - 111 mmol/L   CO2 18 (L) 22 - 32 mmol/L   Glucose, Bld 95 70 - 99 mg/dL   BUN 52 (H) 6 - 20 mg/dL   Creatinine, Ser 3.07 (H) 0.44 - 1.00 mg/dL   Calcium 8.4 (L) 8.9 - 10.3 mg/dL   Total Protein 8.1 6.5 - 8.1 g/dL   Albumin 2.5 (L) 3.5 - 5.0 g/dL   AST 11 (L) 15 - 41 U/L   ALT 7 0 - 44 U/L   Alkaline Phosphatase 86 38 - 126 U/L   Total Bilirubin 0.9 0.3 - 1.2 mg/dL   GFR calc non Af Amer 17 (L) >60 mL/min   GFR calc Af Amer 20 (L) >60 mL/min    Comment: (NOTE) The eGFR has been calculated using the CKD EPI equation. This calculation has not been validated in all clinical situations. eGFR's persistently <60 mL/min  signify possible Chronic Kidney Disease.    Anion gap 9 5 - 15    Comment: Performed at [facility], [address], [place], [place] [address]  Magnesium     Status: Abnormal   Collection Time: 10/25/17  6:24 AM  Result Value Ref Range   Magnesium 1.6 (L) 1.7 - 2.4 mg/dL    Comment: Performed at [facility], [address] [place], [place] [address]  CBC WITH DIFFERENTIAL     Status: Abnormal   Collection Time: 10/25/17  6:24 AM  Result Value Ref Range   WBC 10.3 4.0 - 10.5 K/uL   RBC 2.74 (L) 3.87 - 5.11 MIL/uL   Hemoglobin 8.2 (L) 12.0 - 15.0 g/dL   HCT 27.0 (L) 36.0 - 46.0 %   MCV 98.5 78.0 - 100.0 fL   MCH 29.9 26.0 - 34.0 pg   MCHC 30.4 30.0 - 36.0 g/dL   RDW 13.9 11.5 - 15.5 %   Platelets 306 150 - 400 K/uL   Neutrophils Relative % 75 %   Neutro Abs 7.8 (H) 1.7 - 7.7 K/uL   Lymphocytes Relative 12 %   Lymphs Abs 1.2 0.7 - 4.0 K/uL   Monocytes Relative 10 %   Monocytes Absolute 1.0 0.1 - 1.0 K/uL   Eosinophils Relative 3 %   Eosinophils Absolute 0.4 0.0 - 0.7 K/uL   Basophils Relative 0 %   Basophils Absolute 0.0 0.0 - 0.1 K/uL    Comment: Performed at [facility], [address], [place], [place] [address]    Dg Ankle Complete Right  Result Date: 10/23/2017 CLINICAL DATA:  Blunt trauma several months ago with increasing pain and soft tissue wound EXAM: RIGHT ANKLE - COMPLETE 3+ VIEW COMPARISON:  06/27/2017 FINDINGS: Increasing soft tissue wound is noted medially consistent with the given clinical history. Chronic changes of the tarsal bones are noted. Calcaneal spurring is noted. No definitive bony erosion is identified to suggest osteomyelitis. IMPRESSION: Enlarging soft tissue wound. Chronic degenerative changes similar to that seen on the prior exam. Electronically Signed   By: Inez Catalina M.D.   On: 10/23/2017 14:30   US Arterial Abi (screening Lower Extremity)  Result Date: 10/24/2017 CLINICAL DATA:  47 year old female with a history  of a wound. Cardiovascular  risk factors include hypertension EXAM: NONINVASIVE PHYSIOLOGIC VASCULAR STUDY OF BILATERAL LOWER EXTREMITIES TECHNIQUE: Evaluation of both lower extremities was performed at rest, including calculation of ankle-brachial indices, multiple segmental pressure evaluation, segmental Doppler and segmental pulse volume recording. COMPARISON:  None. FINDINGS: Right ABI:  1.16 Left ABI:  1.08 Right Lower Extremity: Segmental Doppler at the right lower extremity demonstrates monophasic waveform of the tibial arteries Left Lower Extremity: Segmental Doppler at the left lower extremity demonstrates biphasic waveform. IMPRESSION: Right: Resting ABI within normal limits, however, this is likely falsely elevated, as the waveform demonstrates significant arterial occlusive disease, at least of the tibial arteries. Left: Resting ABI within normal limits. Segmental exam demonstrates biphasic waveforms at the left ankle. Signed, Dulcy Fanny. Dellia Nims, RPVI [facility] Electronically Signed   By: Corrie Mckusick D.O.   On: 10/24/2017 14:26    ROS:  Pertinent items are noted in HPI.  Blood pressure (!) 142/84, pulse 87, temperature 98.3 F (36.8 C), temperature source Oral, resp. rate 16, height 5' 11" (1.803 m), weight 120.7 kg, last menstrual period 09/28/2017, SpO2 99 %. Physical Exam: Pleasant black female no acute distress Head is normocephalic, atraumatic Lungs clear to auscultation with equal breath sounds bilaterally Heart examination reveals a regular rate and rhythm. Right lower extremity examination reveals chronic skin changes present in the pretibial region with swelling of the right foot.  I do palpate a dorsalis pedis pulse.  A large 5 to 6 cm skin ulceration is present down to the subcutaneous tissue.  I do not see any bone exposed.  There is old granulation tissue present with some fibrinous exudate present.  Minimal necrotic tissue was noted in the  periphery.  Assessment/Plan: Impression: Skin ulceration right lower extremity, possibly secondary to venous stasis disease.  There may be an arterial component though a dorsalis pedis pulse is palpable.  No need for acute surgical intervention at this time. Plan: Continue Dakin's wet-to-dry dressings twice a day.  I did tell the patient that she needs to be taking care of this, or she will lose her leg.  Aviva Signs 10/25/2017, 1:17 PM

## 2017-10-25 NOTE — Care Management (Signed)
Pt provided with MATCH to have abx filled if DC'd over weekend.

## 2017-10-25 NOTE — Progress Notes (Addendum)
PROGRESS NOTE    [name]  VXB:[id number] DOB: September 07, 1970 DOA: 10/23/2017 PCP: Soyla Dryer, PA-C    Brief Narrative:  47 year old female with a history of chronic diastolic CHF, atrial fibrillation, chronic kidney disease, was sent to the emergency room from her primary care physician's office for worsening lower extremity wound.  This is been present for almost 6 months, and has gradually gotten worse.  There  was felt to be surrounding cellulitis and she was started on intravenous antibiotics.   Assessment & Plan:   Principal Problem:   Cellulitis Active Problems:   Accelerated hypertension   Morbid obesity (HCC)   Pulmonary hypertension (HCC)   HTN (hypertension)   CHF (congestive heart failure) (HCC)   CKD (chronic kidney disease) stage 4, GFR 15-29 ml/min (HCC)   Persistent atrial fibrillation (HCC)   Wound infection   Anemia in chronic kidney disease (CKD)   Gout   Skin ulcer with fat layer exposed ([place])   1. Cellulitis of right lower extremity.  Currently on intravenous antibiotics.  Overall erythema appears to be improving.  WBC trending down.  Will discontinue IV abx and transition to oral doxycycline 2. Chronic ulcer of right lower leg.  Patient found to have breakdown of skin with necrotic edges.  Bone is not visible.  Patient follows with the wound center as an outpatient and reports his wound is been present for almost 6 months.  Continue wound care services.  She will need close follow-up. Appreciate general surgery input, no indication for surgical debridement at this time   3. Chronic diastolic CHF.  Appears compensated.  Continue home medications. 4. Chronic kidney disease stage IV.  Creatinine is slowly trending up.  Will hold further diuretics for now.  Start gentle hydrationRecheck in a.m. 5. Anemia chronic kidney disease.  Hemoglobin has been trending down.  No obvious signs of bleeding.  Possibly related to underlying wound.  Recheck in a.m.  She  does not describe any melena or hematochezia 6. Persistent atrial fibrillation.  Anticoagulated with Eliquis.  Continue beta-blockers.   DVT prophylaxis: Apixaban Code Status: Full code Family Communication: no family present Disposition Plan: Discharge home with home health services once stable   Consultants:   General surgery  Procedures:     Antimicrobials:   Vancomycin 8/28 >8/30  Cefepime 8/28 > 8/30   Flagyl 8/28 > 8/30  Doxycycline 8/30>   Subjective: No shortness of breath.  1 right lower extremity remains painful.  She feels that this may be slowly improving.  Objective: Vitals:   10/24/17 1543 10/24/17 2206 10/25/17 0831 10/25/17 1443  BP: 135/74 (!) 152/85 (!) 142/84 121/72  Pulse: 79 89 87 93  Resp: 18  16 20   Temp: 97.6 F (36.4 C) 98.4 F (36.9 C) 98.3 F (36.8 C) 98 F (36.7 C)  TempSrc: Oral Oral Oral Oral  SpO2: 97% 99% 99% 100%  Weight:      Height:        Intake/Output Summary (Last 24 hours) at 10/25/2017 1832 Last data filed at 10/25/2017 0600 Gross per 24 hour  Intake 370.83 ml  Output -  Net 370.83 ml   Filed Weights   10/23/17 1710  Weight: 120.7 kg    Examination:  General exam: Appears calm and comfortable  Respiratory system: Clear to auscultation. Respiratory effort normal. Cardiovascular system: S1 & S2 heard, RRR. No JVD, murmurs, rubs, gallops or clicks. Gastrointestinal system: Abdomen is nondistended, soft and nontender. No organomegaly or masses felt. Normal  bowel sounds heard. Central nervous system: Alert and oriented. No focal neurological deficits. Extremities: 1-2+ lower extremity edema (chronic) Skin: Large wound on medial aspect of right lower leg.  Base with pink granulation tissue noted.  Margins appear to have some necrotic tissue.  Overall erythema around the wound is better. Psychiatry: Judgement and insight appear normal. Mood & affect appropriate.     Data Reviewed: I have personally reviewed  following labs and imaging studies  CBC: Recent Labs  Lab 10/23/17 1347 10/24/17 0525 10/25/17 0624  WBC 12.6* 11.3* 10.3  NEUTROABS 10.6* 9.3* 7.8*  HGB 8.4* 7.9* 8.2*  HCT 27.6* 25.5* 27.0*  MCV 98.9 99.2 98.5  PLT 297 289 295   Basic Metabolic Panel: Recent Labs  Lab 10/23/17 1347 10/24/17 0525 10/25/17 0624  NA 139 141 140  K 4.3 4.6 5.0  CL 115* 115* 113*  CO2 16* 17* 18*  GLUCOSE 106* 98 95  BUN 45* 44* 52*  CREATININE 2.74* 2.95* 3.07*  CALCIUM 8.5* 8.4* 8.4*  MG  --  1.7 1.6*   GFR: Estimated Creatinine Clearance: 33.2 mL/min (A) (by C-G formula based on SCr of 3.07 mg/dL (H)). Liver Function Tests: Recent Labs  Lab 10/24/17 0525 10/25/17 0624  AST 10* 11*  ALT 6 7  ALKPHOS 86 86  BILITOT 0.9 0.9  PROT 8.0 8.1  ALBUMIN 2.4* 2.5*   No results for input(s): LIPASE, AMYLASE in the last 168 hours. No results for input(s): AMMONIA in the last 168 hours. Coagulation Profile: No results for input(s): INR, PROTIME in the last 168 hours. Cardiac Enzymes: No results for input(s): CKTOTAL, CKMB, CKMBINDEX, TROPONINI in the last 168 hours. BNP (last 3 results) No results for input(s): PROBNP in the last 8760 hours. HbA1C: No results for input(s): HGBA1C in the last 72 hours. CBG: No results for input(s): GLUCAP in the last 168 hours. Lipid Profile: No results for input(s): CHOL, HDL, LDLCALC, TRIG, CHOLHDL, LDLDIRECT in the last 72 hours. Thyroid Function Tests: No results for input(s): TSH, T4TOTAL, FREET4, T3FREE, THYROIDAB in the last 72 hours. Anemia Panel: No results for input(s): VITAMINB12, FOLATE, FERRITIN, TIBC, IRON, RETICCTPCT in the last 72 hours. Sepsis Labs: No results for input(s): PROCALCITON, LATICACIDVEN in the last 168 hours.  Recent Results (from the past 240 hour(s))  Wound or Superficial Culture     Status: None (Preliminary result)   Collection Time: 10/23/17  2:01 PM  Result Value Ref Range Status   Specimen Description   Final     ANKLE Performed at [facility], [address], [place], [place] [address]    Special Requests   Final    NONE Performed at [facility], [address] [place], [place] [address]    Gram Stain   Final    RARE WBC PRESENT, PREDOMINANTLY PMN ABUNDANT GRAM POSITIVE COCCI ABUNDANT GRAM NEGATIVE RODS MODERATE GRAM POSITIVE RODS    Culture   Final    ABUNDANT MORGANELLA MORGANII ABUNDANT PROTEUS MIRABILIS SUSCEPTIBILITIES TO FOLLOW Performed at [facility], [place] [address] [place], [place] [address]    Report Status PENDING  Incomplete  Culture, blood (Routine X 2) w Reflex to ID Panel     Status: None (Preliminary result)   Collection Time: 10/23/17  3:22 PM  Result Value Ref Range Status   Specimen Description BLOOD RIGHT HAND  Final   Special Requests   Final    BOTTLES DRAWN AEROBIC AND ANAEROBIC Blood Culture adequate volume   Culture  Final    NO GROWTH 2 DAYS Performed at [facility], [address], [place], [place] [address]    Report Status PENDING  Incomplete  Culture, blood (Routine X 2) w Reflex to ID Panel     Status: None (Preliminary result)   Collection Time: 10/23/17  5:36 PM  Result Value Ref Range Status   Specimen Description BLOOD LEFT HAND  Final   Special Requests   Final    BOTTLES DRAWN AEROBIC AND ANAEROBIC Blood Culture adequate volume   Culture   Final    NO GROWTH 2 DAYS Performed at [facility], [address], [place], [place] [address]    Report Status PENDING  Incomplete         Radiology Studies: US Arterial Abi (screening Lower Extremity)  Result Date: 10/24/2017 CLINICAL DATA:  47 year old female with a history of a wound. Cardiovascular risk factors include hypertension EXAM: NONINVASIVE PHYSIOLOGIC VASCULAR STUDY OF BILATERAL LOWER EXTREMITIES TECHNIQUE: Evaluation of both lower extremities was performed at rest, including calculation of ankle-brachial indices, multiple segmental pressure evaluation, segmental Doppler  and segmental pulse volume recording. COMPARISON:  None. FINDINGS: Right ABI:  1.16 Left ABI:  1.08 Right Lower Extremity: Segmental Doppler at the right lower extremity demonstrates monophasic waveform of the tibial arteries Left Lower Extremity: Segmental Doppler at the left lower extremity demonstrates biphasic waveform. IMPRESSION: Right: Resting ABI within normal limits, however, this is likely falsely elevated, as the waveform demonstrates significant arterial occlusive disease, at least of the tibial arteries. Left: Resting ABI within normal limits. Segmental exam demonstrates biphasic waveforms at the left ankle. Signed, Dulcy Fanny. Dellia Nims, RPVI [facility] Radiology Electronically Signed   By: Corrie Mckusick D.O.   On: 10/24/2017 14:26        Scheduled Meds: . amLODipine  10 mg Oral Daily  . apixaban  5 mg Oral BID  . cloNIDine  0.3 mg Oral BID  . doxycycline  100 mg Oral Q12H  . feeding supplement (PRO-STAT SUGAR FREE 64)  30 mL Oral BID  . hydrALAZINE  100 mg Oral TID  . multivitamin with minerals  1 tablet Oral Daily  . nutrition supplement (JUVEN)  1 packet Oral BID BM  . potassium chloride SA  20 mEq Oral BID  . senna-docusate  1 tablet Oral QHS  . sodium bicarbonate  650 mg Oral BID  . sodium chloride flush  3 mL Intravenous Q12H  . sodium hypochlorite   Irrigation BID  . spironolactone  12.5 mg Oral Daily   Continuous Infusions: . sodium chloride    . sodium chloride 10 mL/hr at 10/25/17 0600     LOS: 2 days    Time spent: 27mins    Kathie Dike, MD [facility] Pager [phone]  If 7PM-7AM, please contact night-coverage [email or web address] Password [facility] 10/25/2017, 6:32 PM

## 2017-10-26 DIAGNOSIS — L03115 Cellulitis of right lower limb: Principal | ICD-10-CM

## 2017-10-26 DIAGNOSIS — I272 Pulmonary hypertension, unspecified: Secondary | ICD-10-CM

## 2017-10-26 DIAGNOSIS — I481 Persistent atrial fibrillation: Secondary | ICD-10-CM

## 2017-10-26 DIAGNOSIS — I10 Essential (primary) hypertension: Secondary | ICD-10-CM

## 2017-10-26 DIAGNOSIS — D631 Anemia in chronic kidney disease: Secondary | ICD-10-CM

## 2017-10-26 DIAGNOSIS — N184 Chronic kidney disease, stage 4 (severe): Secondary | ICD-10-CM

## 2017-10-26 LAB — CBC WITH DIFFERENTIAL/PLATELET
BASOS PCT: 0 %
Basophils Absolute: 0 10*3/uL (ref 0.0–0.1)
EOS ABS: 0.6 10*3/uL (ref 0.0–0.7)
Eosinophils Relative: 6 %
HEMATOCRIT: 27.6 % — AB (ref 36.0–46.0)
HEMOGLOBIN: 8.6 g/dL — AB (ref 12.0–15.0)
LYMPHS ABS: 0.8 10*3/uL (ref 0.7–4.0)
Lymphocytes Relative: 8 %
MCH: 30.5 pg (ref 26.0–34.0)
MCHC: 31.2 g/dL (ref 30.0–36.0)
MCV: 97.9 fL (ref 78.0–100.0)
Monocytes Absolute: 1 10*3/uL (ref 0.1–1.0)
Monocytes Relative: 9 %
NEUTROS ABS: 8 10*3/uL — AB (ref 1.7–7.7)
NEUTROS PCT: 77 %
Platelets: 321 10*3/uL (ref 150–400)
RBC: 2.82 MIL/uL — AB (ref 3.87–5.11)
RDW: 13.9 % (ref 11.5–15.5)
WBC: 10.4 10*3/uL (ref 4.0–10.5)

## 2017-10-26 LAB — COMPREHENSIVE METABOLIC PANEL
ALBUMIN: 2.3 g/dL — AB (ref 3.5–5.0)
ALT: 7 U/L (ref 0–44)
ANION GAP: 9 (ref 5–15)
AST: 11 U/L — ABNORMAL LOW (ref 15–41)
Alkaline Phosphatase: 86 U/L (ref 38–126)
BUN: 62 mg/dL — ABNORMAL HIGH (ref 6–20)
CALCIUM: 8.4 mg/dL — AB (ref 8.9–10.3)
CO2: 18 mmol/L — AB (ref 22–32)
Chloride: 112 mmol/L — ABNORMAL HIGH (ref 98–111)
Creatinine, Ser: 2.91 mg/dL — ABNORMAL HIGH (ref 0.44–1.00)
GFR calc non Af Amer: 18 mL/min — ABNORMAL LOW (ref >60)
GFR, EST AFRICAN AMERICAN: 21 mL/min — AB (ref >60)
GLUCOSE: 89 mg/dL (ref 70–99)
POTASSIUM: 5.1 mmol/L (ref 3.5–5.1)
SODIUM: 139 mmol/L (ref 135–145)
Total Bilirubin: 0.8 mg/dL (ref 0.3–1.2)
Total Protein: 7.6 g/dL (ref 6.5–8.1)

## 2017-10-26 LAB — AEROBIC CULTURE  (SUPERFICIAL SPECIMEN)

## 2017-10-26 LAB — AEROBIC CULTURE W GRAM STAIN (SUPERFICIAL SPECIMEN)

## 2017-10-26 LAB — MAGNESIUM: Magnesium: 1.9 mg/dL (ref 1.7–2.4)

## 2017-10-26 MED ORDER — LEVOFLOXACIN 500 MG PO TABS: 500.0000 mg | ORAL_TABLET | ORAL | 0 refills | Status: DC

## 2017-10-26 MED ORDER — DAKINS (1/4 STRENGTH) 0.125 % EX SOLN: Freq: Two times a day (BID) | CUTANEOUS | 0 refills | Status: AC

## 2017-10-26 MED ORDER — LEVOFLOXACIN 500 MG PO TABS
500.0000 mg | ORAL_TABLET | ORAL | Status: DC
  Administered 2017-10-26: 500 mg via ORAL
  Filled 2017-10-26: qty 1

## 2017-10-26 MED ORDER — OXYCODONE-ACETAMINOPHEN 5-325 MG PO TABS: 1.0000 | ORAL_TABLET | Freq: Four times a day (QID) | ORAL | 0 refills | Status: DC | PRN

## 2017-10-26 MED ORDER — HYDROXYZINE HCL 50 MG/ML IM SOLN: 50.0000 mg | Freq: Once | INTRAMUSCULAR | Status: DC | PRN

## 2017-10-26 MED ORDER — SODIUM BICARBONATE 650 MG PO TABS: 650.0000 mg | ORAL_TABLET | Freq: Two times a day (BID) | ORAL | 0 refills | Status: DC

## 2017-10-26 MED ORDER — HYDROXYZINE HCL 25 MG PO TABS
50.0000 mg | ORAL_TABLET | Freq: Three times a day (TID) | ORAL | Status: DC | PRN
  Administered 2017-10-26 (×2): 50 mg via ORAL
  Filled 2017-10-26 (×2): qty 2

## 2017-10-26 NOTE — Progress Notes (Signed)
Reviewed d/c paperwork with patient. Reviewed medication changes. Answered all questions. Wheeled stable patient to main entrance where she was picked up by friend.

## 2017-10-26 NOTE — Progress Notes (Signed)
Patient lost IV access. Two RN's and AC unable to obtain new IV access. MD made aware. Care order to leave out.

## 2017-10-26 NOTE — Discharge Summary (Signed)
Physician Discharge Summary  [name]:[id number] DOB: March 13, 1970 DOA: 10/23/2017  PCP: Soyla Dryer, PA-C  Admit date: 10/23/2017 Discharge date: 10/26/2017  Admitted From: Home Disposition: Home  Recommendations for Outpatient Follow-up:  1. Follow up with PCP in 1-2 weeks 2. Please obtain BMP/CBC in one week 3. Patient will follow with the wound care center  Discharge Condition: Stable CODE STATUS: Full code Diet recommendation: Heart healthy, carb modified  Brief/Interim Summary: 47 year old female with a history of chronic diastolic CHF, atrial fibrillation, chronic kidney disease, was sent to the emergency room from her primary care physician's office for worsening lower extremity wound.  This is been present for almost 6 months, and has gradually gotten worse.  It was felt to be surrounding cellulitis and she was started on intravenous antibiotics.  Discharge Diagnoses:  Principal Problem:   Cellulitis Active Problems:   Accelerated hypertension   Morbid obesity (HCC)   Pulmonary hypertension (HCC)   HTN (hypertension)   CHF (congestive heart failure) (HCC)   CKD (chronic kidney disease) stage 4, GFR 15-29 ml/min (HCC)   Persistent atrial fibrillation (HCC)   Wound infection   Anemia in chronic kidney disease (CKD)   Gout   Skin ulcer with fat layer exposed ([place])  1. Cellulitis of right lower extremity.    Patient was treated with intravenous antibiotics.  Overall erythema has resolved.  WBC count has normalized.  Will complete a course of oral antibiotics.. 2. Chronic ulcer of right lower leg.  Patient follows with the wound center as an outpatient and reports his wound is been present for almost 6 months.    Seen by general surgery and no recommendations for surgical debridement.  Continue current wound care.  Wound culture shows Moraxella as well as Proteus.  Antibiotics changed to Levaquin.  3. Chronic diastolic CHF.  Appears compensated.  Continue home  medications. 4. Chronic kidney disease stage IV.    Creatinine was slowly trending up in the hospital.  She received some IV fluids and renal function has been stable.  Continue outpatient regimen on discharge. 5. Anemia chronic kidney disease.    Likely related to chronic disease.  Could be related to renal disease as well as chronic wound.  Was trending down but is now stable.  No evidence of ongoing bleeding.  Continue to monitor. 6. Persistent atrial fibrillation.  Anticoagulated with Eliquis.  Continue beta-blockers.  Discharge Instructions  Discharge Instructions    Diet - low sodium heart healthy   Complete by:  As directed    Increase activity slowly   Complete by:  As directed      Allergies as of 10/26/2017      Reactions   Penicillins Hives, Other (See Comments)   Childhood allergy. Has patient had a PCN reaction causing immediate rash, facial/tongue/throat swelling, SOB or lightheadedness with hypotension: NO Has patient had a PCN reaction causing severe rash involving mucus membranes or skin necrosis: No NO Has patient had a PCN reaction that required hospitalization No NO Has patient had a PCN reaction occurring within the last 10 years: NO If all of the above answers are "NO", then may pro      Medication List    TAKE these medications   amLODipine 10 MG tablet Commonly known as:  NORVASC TAKE 1 TABLET BY MOUTH DAILY.   apixaban 5 MG Tabs tablet Commonly known as:  ELIQUIS Take 1 tablet (5 mg total) by mouth 2 (two) times daily.   cloNIDine 0.3 MG  tablet Commonly known as:  CATAPRES TAKE 1 TABLET BY MOUTH 2 TIMES DAILY. MAY TAKE AN ADDITIONAL TABLET AS NEEDED FOR SBP>180   hydrALAZINE 100 MG tablet Commonly known as:  APRESOLINE Take 1 tablet (100 mg total) by mouth 3 (three) times daily.   levofloxacin 500 MG tablet Commonly known as:  LEVAQUIN Take 1 tablet (500 mg total) by mouth every other day. Start taking on:  10/28/2017   oxyCODONE-acetaminophen  5-325 MG tablet Commonly known as:  PERCOCET/ROXICET Take 1 tablet by mouth every 6 (six) hours as needed for severe pain.   potassium chloride SA 20 MEQ tablet Commonly known as:  K-DUR,KLOR-CON TAKE 1 TABLET BY MOUTH 2 TIMES DAILY.   sodium bicarbonate 650 MG tablet Take 1 tablet (650 mg total) by mouth 2 (two) times daily.   sodium hypochlorite 0.125 % Soln Commonly known as:  DAKIN'S 1/4 STRENGTH Irrigate with as directed 2 (two) times daily.   spironolactone 25 MG tablet Commonly known as:  ALDACTONE Take 0.5 tablets (12.5 mg total) by mouth daily.   torsemide 20 MG tablet Commonly known as:  DEMADEX Take 2 tablets (40 mg total) by mouth 2 (two) times daily. Take extra 20 mg tablet once daily as needed for 3 lb weight gain.       Allergies  Allergen Reactions  . Penicillins Hives and Other (See Comments)    Childhood allergy. Has patient had a PCN reaction causing immediate rash, facial/tongue/throat swelling, SOB or lightheadedness with hypotension: NO Has patient had a PCN reaction causing severe rash involving mucus membranes or skin necrosis: No NO Has patient had a PCN reaction that required hospitalization No NO Has patient had a PCN reaction occurring within the last 10 years: NO If all of the above answers are "NO", then may pro    Consultations:  General surgery   Procedures/Studies: Dg Ankle Complete Right  Result Date: 10/23/2017 CLINICAL DATA:  Blunt trauma several months ago with increasing pain and soft tissue wound EXAM: RIGHT ANKLE - COMPLETE 3+ VIEW COMPARISON:  06/27/2017 FINDINGS: Increasing soft tissue wound is noted medially consistent with the given clinical history. Chronic changes of the tarsal bones are noted. Calcaneal spurring is noted. No definitive bony erosion is identified to suggest osteomyelitis. IMPRESSION: Enlarging soft tissue wound. Chronic degenerative changes similar to that seen on the prior exam. Electronically Signed   By:  Inez Catalina M.D.   On: 10/23/2017 14:30   US Arterial Abi (screening Lower Extremity)  Result Date: 10/24/2017 CLINICAL DATA:  47 year old female with a history of a wound. Cardiovascular risk factors include hypertension EXAM: NONINVASIVE PHYSIOLOGIC VASCULAR STUDY OF BILATERAL LOWER EXTREMITIES TECHNIQUE: Evaluation of both lower extremities was performed at rest, including calculation of ankle-brachial indices, multiple segmental pressure evaluation, segmental Doppler and segmental pulse volume recording. COMPARISON:  None. FINDINGS: Right ABI:  1.16 Left ABI:  1.08 Right Lower Extremity: Segmental Doppler at the right lower extremity demonstrates monophasic waveform of the tibial arteries Left Lower Extremity: Segmental Doppler at the left lower extremity demonstrates biphasic waveform. IMPRESSION: Right: Resting ABI within normal limits, however, this is likely falsely elevated, as the waveform demonstrates significant arterial occlusive disease, at least of the tibial arteries. Left: Resting ABI within normal limits. Segmental exam demonstrates biphasic waveforms at the left ankle. Signed, Dulcy Fanny. Dellia Nims, RPVI [facility] Electronically Signed   By: Corrie Mckusick D.O.   On: 10/24/2017 14:26       Subjective:  Feels that overall wound is improving.  Pain and wound is getting better.  Discharge Exam: Vitals:   10/25/17 0831 10/25/17 1443 10/25/17 2049 10/26/17 0514  BP: (!) 142/84 121/72 (!) 141/73 131/86  Pulse: 87 93 89 83  Resp: 16 20 18 16   Temp: 98.3 F (36.8 C) 98 F (36.7 C) 98.3 F (36.8 C) 97.8 F (36.6 C)  TempSrc: Oral Oral Oral Oral  SpO2: 99% 100% 95% 99%  Weight:      Height:        General: Pt is alert, awake, not in acute distress Cardiovascular: RRR, S1/S2 +, no rubs, no gallops Respiratory: CTA bilaterally, no wheezing, no rhonchi Abdominal: Soft, NT, ND, bowel sounds + Extremities: Wound on right  lower extremity with areas of necrosis around the periphery improving.  Base of wound is pink granulation tissue    The results of significant diagnostics from this hospitalization (including imaging, microbiology, ancillary and laboratory) are listed below for reference.     Microbiology: Recent Results (from the past 240 hour(s))  Wound or Superficial Culture     Status: None   Collection Time: 10/23/17  2:01 PM  Result Value Ref Range Status   Specimen Description   Final    ANKLE Performed at [facility], [address] [place], [place] [address]    Special Requests   Final    NONE Performed at [facility], [address] [place], [place] [address]    Gram Stain   Final    RARE WBC PRESENT, PREDOMINANTLY PMN ABUNDANT GRAM POSITIVE COCCI ABUNDANT GRAM NEGATIVE RODS MODERATE GRAM POSITIVE RODS Performed at [facility], [place] [address], [place], [place] [address]    Culture   Final    ABUNDANT MORGANELLA MORGANII ABUNDANT PROTEUS MIRABILIS    Report Status 10/26/2017 FINAL  Final   Organism ID, Bacteria MORGANELLA MORGANII  Final   Organism ID, Bacteria PROTEUS MIRABILIS  Final      Susceptibility   Morganella morganii - MIC*    AMPICILLIN >=32 RESISTANT Resistant     CEFAZOLIN >=64 RESISTANT Resistant     CEFEPIME <=1 SENSITIVE Sensitive     CEFTAZIDIME <=1 SENSITIVE Sensitive     CEFTRIAXONE <=1 SENSITIVE Sensitive     CIPROFLOXACIN <=0.25 SENSITIVE Sensitive     GENTAMICIN <=1 SENSITIVE Sensitive     IMIPENEM 8 INTERMEDIATE Intermediate     TRIMETH/SULFA <=20 SENSITIVE Sensitive     AMPICILLIN/SULBACTAM >=32 RESISTANT Resistant     PIP/TAZO <=4 SENSITIVE Sensitive     * ABUNDANT MORGANELLA MORGANII   Proteus mirabilis - MIC*    AMPICILLIN <=2 SENSITIVE Sensitive     CEFAZOLIN <=4 SENSITIVE Sensitive     CEFEPIME <=1 SENSITIVE Sensitive     CEFTAZIDIME <=1 SENSITIVE Sensitive     CEFTRIAXONE <=1 SENSITIVE Sensitive     CIPROFLOXACIN <=0.25 SENSITIVE  Sensitive     GENTAMICIN <=1 SENSITIVE Sensitive     IMIPENEM 8 INTERMEDIATE Intermediate     TRIMETH/SULFA <=20 SENSITIVE Sensitive     AMPICILLIN/SULBACTAM <=2 SENSITIVE Sensitive     PIP/TAZO <=4 SENSITIVE Sensitive     * ABUNDANT PROTEUS MIRABILIS  Culture, blood (Routine X 2) w Reflex to ID Panel     Status: None (Preliminary result)   Collection Time: 10/23/17  3:22 PM  Result Value Ref Range Status   Specimen Description BLOOD RIGHT HAND  Final   Special Requests   Final    BOTTLES DRAWN AEROBIC AND ANAEROBIC  Blood Culture adequate volume   Culture   Final    NO GROWTH 3 DAYS Performed at [facility], [address] [place], [place] [address]    Report Status PENDING  Incomplete  Culture, blood (Routine X 2) w Reflex to ID Panel     Status: None (Preliminary result)   Collection Time: 10/23/17  5:36 PM  Result Value Ref Range Status   Specimen Description BLOOD LEFT HAND  Final   Special Requests   Final    BOTTLES DRAWN AEROBIC AND ANAEROBIC Blood Culture adequate volume   Culture   Final    NO GROWTH 3 DAYS Performed at [facility], [address] [place], [place] [address]    Report Status PENDING  Incomplete     Labs: BNP (last 3 results) Recent Labs    11/07/16 0937 11/21/16 1802  BNP 779.0* 417.4*   Basic Metabolic Panel: Recent Labs  Lab 10/23/17 1347 10/24/17 0525 10/25/17 0624 10/26/17 0701  NA 139 141 140 139  K 4.3 4.6 5.0 5.1  CL 115* 115* 113* 112*  CO2 16* 17* 18* 18*  GLUCOSE 106* 98 95 89  BUN 45* 44* 52* 62*  CREATININE 2.74* 2.95* 3.07* 2.91*  CALCIUM 8.5* 8.4* 8.4* 8.4*  MG  --  1.7 1.6* 1.9   Liver Function Tests: Recent Labs  Lab 10/24/17 0525 10/25/17 0624 10/26/17 0701  AST 10* 11* 11*  ALT 6 7 7   ALKPHOS 86 86 86  BILITOT 0.9 0.9 0.8  PROT 8.0 8.1 7.6  ALBUMIN 2.4* 2.5* 2.3*   No results for input(s): LIPASE, AMYLASE in the last 168 hours. No results for input(s): AMMONIA in the last 168 hours. CBC: Recent Labs   Lab 10/23/17 1347 10/24/17 0525 10/25/17 0624 10/26/17 0701  WBC 12.6* 11.3* 10.3 10.4  NEUTROABS 10.6* 9.3* 7.8* 8.0*  HGB 8.4* 7.9* 8.2* 8.6*  HCT 27.6* 25.5* 27.0* 27.6*  MCV 98.9 99.2 98.5 97.9  PLT 297 289 306 321   Cardiac Enzymes: No results for input(s): CKTOTAL, CKMB, CKMBINDEX, TROPONINI in the last 168 hours. BNP: Invalid input(s): POCBNP CBG: No results for input(s): GLUCAP in the last 168 hours. D-Dimer No results for input(s): DDIMER in the last 72 hours. Hgb A1c No results for input(s): HGBA1C in the last 72 hours. Lipid Profile No results for input(s): CHOL, HDL, LDLCALC, TRIG, CHOLHDL, LDLDIRECT in the last 72 hours. Thyroid function studies No results for input(s): TSH, T4TOTAL, T3FREE, THYROIDAB in the last 72 hours.  Invalid input(s): FREET3 Anemia work up No results for input(s): VITAMINB12, FOLATE, FERRITIN, TIBC, IRON, RETICCTPCT in the last 72 hours. Urinalysis    Component Value Date/Time   COLORURINE YELLOW 06/26/2016 2230   APPEARANCEUR CLEAR 06/26/2016 2230   LABSPEC 1.011 06/26/2016 2230   PHURINE 5.0 06/26/2016 2230   GLUCOSEU NEGATIVE 06/26/2016 2230   HGBUR SMALL (A) 06/26/2016 2230   BILIRUBINUR NEGATIVE 06/26/2016 2230   KETONESUR NEGATIVE 06/26/2016 2230   PROTEINUR 100 (A) 06/26/2016 2230   UROBILINOGEN 0.2 12/07/2014 0014   NITRITE NEGATIVE 06/26/2016 2230   LEUKOCYTESUR NEGATIVE 06/26/2016 2230   Sepsis Labs Invalid input(s): PROCALCITONIN,  WBC,  LACTICIDVEN Microbiology Recent Results (from the past 240 hour(s))  Wound or Superficial Culture     Status: None   Collection Time: 10/23/17  2:01 PM  Result Value Ref Range Status   Specimen Description   Final    ANKLE Performed at [facility], [address] [place], [place] [address]  Special Requests   Final    NONE Performed at [facility], [address] [place], [place] [address]    Gram Stain   Final    RARE WBC PRESENT, PREDOMINANTLY PMN ABUNDANT GRAM  POSITIVE COCCI ABUNDANT GRAM NEGATIVE RODS MODERATE GRAM POSITIVE RODS Performed at [facility], [place] [address], [place], [place] [address]    Culture   Final    ABUNDANT MORGANELLA MORGANII ABUNDANT PROTEUS MIRABILIS    Report Status 10/26/2017 FINAL  Final   Organism ID, Bacteria MORGANELLA MORGANII  Final   Organism ID, Bacteria PROTEUS MIRABILIS  Final      Susceptibility   Morganella morganii - MIC*    AMPICILLIN >=32 RESISTANT Resistant     CEFAZOLIN >=64 RESISTANT Resistant     CEFEPIME <=1 SENSITIVE Sensitive     CEFTAZIDIME <=1 SENSITIVE Sensitive     CEFTRIAXONE <=1 SENSITIVE Sensitive     CIPROFLOXACIN <=0.25 SENSITIVE Sensitive     GENTAMICIN <=1 SENSITIVE Sensitive     IMIPENEM 8 INTERMEDIATE Intermediate     TRIMETH/SULFA <=20 SENSITIVE Sensitive     AMPICILLIN/SULBACTAM >=32 RESISTANT Resistant     PIP/TAZO <=4 SENSITIVE Sensitive     * ABUNDANT MORGANELLA MORGANII   Proteus mirabilis - MIC*    AMPICILLIN <=2 SENSITIVE Sensitive     CEFAZOLIN <=4 SENSITIVE Sensitive     CEFEPIME <=1 SENSITIVE Sensitive     CEFTAZIDIME <=1 SENSITIVE Sensitive     CEFTRIAXONE <=1 SENSITIVE Sensitive     CIPROFLOXACIN <=0.25 SENSITIVE Sensitive     GENTAMICIN <=1 SENSITIVE Sensitive     IMIPENEM 8 INTERMEDIATE Intermediate     TRIMETH/SULFA <=20 SENSITIVE Sensitive     AMPICILLIN/SULBACTAM <=2 SENSITIVE Sensitive     PIP/TAZO <=4 SENSITIVE Sensitive     * ABUNDANT PROTEUS MIRABILIS  Culture, blood (Routine X 2) w Reflex to ID Panel     Status: None (Preliminary result)   Collection Time: 10/23/17  3:22 PM  Result Value Ref Range Status   Specimen Description BLOOD RIGHT HAND  Final   Special Requests   Final    BOTTLES DRAWN AEROBIC AND ANAEROBIC Blood Culture adequate volume   Culture   Final    NO GROWTH 3 DAYS Performed at [facility], [address], [place], [place] [address]    Report Status PENDING  Incomplete  Culture, blood (Routine X 2) w Reflex to ID  Panel     Status: None (Preliminary result)   Collection Time: 10/23/17  5:36 PM  Result Value Ref Range Status   Specimen Description BLOOD LEFT HAND  Final   Special Requests   Final    BOTTLES DRAWN AEROBIC AND ANAEROBIC Blood Culture adequate volume   Culture   Final    NO GROWTH 3 DAYS Performed at [facility], [address] [place], [place] [address]    Report Status PENDING  Incomplete     Time coordinating discharge: 40mins  SIGNED:   Kathie Dike, MD  [facility] 10/26/2017, 3:07 PM Pager   If 7PM-7AM, please contact night-coverage [email or web address] Password [email or web address]

## 2017-10-26 NOTE — Progress Notes (Signed)
Discussed home health with patient, she declined and will continue o follow at the wound care center as established prior to admission.

## 2017-10-28 LAB — CULTURE, BLOOD (ROUTINE X 2)
Culture: NO GROWTH
Culture: NO GROWTH
SPECIAL REQUESTS: ADEQUATE
Special Requests: ADEQUATE

## 2017-11-05 ENCOUNTER — Ambulatory Visit: Payer: Self-pay | Admitting: Physician Assistant

## 2017-11-07 ENCOUNTER — Ambulatory Visit: Payer: Medicaid Other | Admitting: Physician Assistant

## 2017-11-07 ENCOUNTER — Encounter: Payer: Self-pay | Admitting: Physician Assistant

## 2017-11-07 VITALS — BP 152/88 | HR 107 | Temp 98.1°F | Ht 69.25 in | Wt 270.5 lb

## 2017-11-07 DIAGNOSIS — R601 Generalized edema: Secondary | ICD-10-CM

## 2017-11-07 DIAGNOSIS — I5032 Chronic diastolic (congestive) heart failure: Secondary | ICD-10-CM

## 2017-11-07 DIAGNOSIS — Z13220 Encounter for screening for lipoid disorders: Secondary | ICD-10-CM

## 2017-11-07 DIAGNOSIS — I10 Essential (primary) hypertension: Secondary | ICD-10-CM

## 2017-11-07 DIAGNOSIS — Z833 Family history of diabetes mellitus: Secondary | ICD-10-CM

## 2017-11-07 DIAGNOSIS — I272 Pulmonary hypertension, unspecified: Secondary | ICD-10-CM

## 2017-11-07 DIAGNOSIS — D649 Anemia, unspecified: Secondary | ICD-10-CM

## 2017-11-07 DIAGNOSIS — Z131 Encounter for screening for diabetes mellitus: Secondary | ICD-10-CM

## 2017-11-07 DIAGNOSIS — L97919 Non-pressure chronic ulcer of unspecified part of right lower leg with unspecified severity: Secondary | ICD-10-CM

## 2017-11-07 DIAGNOSIS — Z7901 Long term (current) use of anticoagulants: Secondary | ICD-10-CM

## 2017-11-07 DIAGNOSIS — I4891 Unspecified atrial fibrillation: Secondary | ICD-10-CM

## 2017-11-07 DIAGNOSIS — N189 Chronic kidney disease, unspecified: Secondary | ICD-10-CM

## 2017-11-07 DIAGNOSIS — I4819 Other persistent atrial fibrillation: Secondary | ICD-10-CM

## 2017-11-07 MED ORDER — TRAMADOL HCL 50 MG PO TABS: 50.0000 mg | ORAL_TABLET | Freq: Three times a day (TID) | ORAL | 0 refills | Status: DC | PRN

## 2017-11-07 NOTE — Progress Notes (Signed)
BP (!) 152/88   Pulse (!) 107   Temp 98.1 F (36.7 C)   Ht 5' 9.25" (1.759 m)   Wt 270 lb 8 oz (122.7 kg)   SpO2 99%   BMI 39.66 kg/m    Subjective:    Patient ID: [name], female    DOB: May 11, 1970, 47 y.o.   MRN: [id number]  HPI: [name] is a 47 y.o. female presenting on 11/07/2017 for Follow-up   HPI   Pt was seen here 8/28 and was sent to hospital due to RLE wound infection.  She was kept for several days and discharged on levaquin.    Pt didn't bring meds today.  She says she's "taking the same stuff".    Pt states leg pain 10 out of 10.  Pt denies HA, CP.   She has appointtment with wound clinic on Monday.    Pt says she doesn't have orthopnea, but she doesn't like to lay back.   Pt says she applied for medicaid while in the hospital.    Relevant past medical, surgical, family and social history reviewed and updated as indicated. Interim medical history since our last visit reviewed. Allergies and medications reviewed and updated.   Current Outpatient Medications:  .  amLODipine (NORVASC) 10 MG tablet, TAKE 1 TABLET BY MOUTH DAILY., Disp: 30 tablet, Rfl: 0 .  apixaban (ELIQUIS) 5 MG TABS tablet, Take 1 tablet (5 mg total) by mouth 2 (two) times daily., Disp: 180 tablet, Rfl: 3 .  cloNIDine (CATAPRES) 0.3 MG tablet, TAKE 1 TABLET BY MOUTH 2 TIMES DAILY. MAY TAKE AN ADDITIONAL TABLET AS NEEDED FOR SBP>180, Disp: 60 tablet, Rfl: 0 .  hydrALAZINE (APRESOLINE) 100 MG tablet, Take 1 tablet (100 mg total) by mouth 3 (three) times daily., Disp: 90 tablet, Rfl: 11 .  levofloxacin (LEVAQUIN) 500 MG tablet, Take 1 tablet (500 mg total) by mouth every other day., Disp: 7 tablet, Rfl: 0 .  potassium chloride SA (K-DUR,KLOR-CON) 20 MEQ tablet, TAKE 1 TABLET BY MOUTH 2 TIMES DAILY., Disp: 60 tablet, Rfl: 0 .  sodium bicarbonate 650 MG tablet, Take 1 tablet (650 mg total) by mouth 2 (two) times daily., Disp: 60 tablet, Rfl: 0 .  sodium hypochlorite (DAKIN'S 1/4  STRENGTH) 0.125 % SOLN, Irrigate with as directed 2 (two) times daily., Disp: 473 mL, Rfl: 0 .  spironolactone (ALDACTONE) 25 MG tablet, Take 0.5 tablets (12.5 mg total) by mouth daily., Disp: 15 tablet, Rfl: 3 .  torsemide (DEMADEX) 20 MG tablet, Take 2 tablets (40 mg total) by mouth 2 (two) times daily. Take extra 20 mg tablet once daily as needed for 3 lb weight gain., Disp: 150 tablet, Rfl: 11 .  oxyCODONE-acetaminophen (PERCOCET/ROXICET) 5-325 MG tablet, Take 1 tablet by mouth every 6 (six) hours as needed for severe pain. (Patient not taking: Reported on 11/07/2017), Disp: 15 tablet, Rfl: 0   Review of Systems  Constitutional: Negative for appetite change, chills, diaphoresis, fatigue, fever and unexpected weight change.  HENT: Negative for congestion, dental problem, drooling, ear pain, facial swelling, hearing loss, mouth sores, sneezing, sore throat, trouble swallowing and voice change.   Eyes: Positive for itching. Negative for pain, discharge, redness and visual disturbance.  Respiratory: Negative for cough, choking, shortness of breath and wheezing.   Cardiovascular: Positive for leg swelling. Negative for chest pain and palpitations.  Gastrointestinal: Negative for abdominal pain, blood in stool, constipation, diarrhea and vomiting.  Endocrine: Negative for cold intolerance, heat intolerance and  polydipsia.  Genitourinary: Negative for decreased urine volume, dysuria and hematuria.  Musculoskeletal: Negative for arthralgias, back pain and gait problem.  Skin: Positive for rash.  Allergic/Immunologic: Negative for environmental allergies.  Neurological: Negative for seizures, syncope, light-headedness and headaches.  Hematological: Negative for adenopathy.  Psychiatric/Behavioral: Negative for agitation, dysphoric mood and suicidal ideas. The patient is not nervous/anxious.     Per HPI unless specifically indicated above     Objective:    BP (!) 152/88   Pulse (!) 107   Temp  98.1 F (36.7 C)   Ht 5' 9.25" (1.759 m)   Wt 270 lb 8 oz (122.7 kg)   SpO2 99%   BMI 39.66 kg/m   Wt Readings from Last 3 Encounters:  11/07/17 270 lb 8 oz (122.7 kg)  10/23/17 266 lb 1.5 oz (120.7 kg)  10/23/17 269 lb (122 kg)    Physical Exam  Constitutional: She is oriented to person, place, and time. She appears well-developed and well-nourished.  HENT:  Head: Normocephalic and atraumatic.  Neck: Neck supple.  Cardiovascular: Normal rate and regular rhythm.  Pulmonary/Chest: Effort normal. No respiratory distress. She has no wheezes. She has rales.  Abdominal: Soft. Bowel sounds are normal. She exhibits no mass. There is no hepatosplenomegaly. There is no tenderness.  Musculoskeletal: She exhibits edema (BLE weeping).  Lymphadenopathy:    She has no cervical adenopathy.  Neurological: She is alert and oriented to person, place, and time.  Skin: Skin is warm and dry.  Large ulcer/wound RLE.  Appears clean.  it is deep but is looking better than it did on 8/28.  Psychiatric: She has a normal mood and affect. Her behavior is normal.  Vitals reviewed.        Assessment & Plan:    Encounter Diagnoses  Name Primary?  . Ulcer of right lower extremity, unspecified ulcer stage ([place]) Yes  . Accelerated hypertension   . Atrial fibrillation with controlled ventricular response ([place])   . Chronic kidney disease, unspecified CKD stage   . Pulmonary hypertension ([place])   . Chronic diastolic heart failure ([place])   . Anticoagulant long-term use   . Anasarca   . Screening for diabetes mellitus   . Family history of diabetes mellitus   . Persistent atrial fibrillation ([place])   . Anemia, unspecified type   . Screening cholesterol level     -need to check Bmp, Cbc- still need lipid, a1c ordered in may and never done.  Pt will get fasting labs tomorrow morning -Refer to nephrology for CKD -pt will go to Wound care appointment Monday as scheduled.  She will continue with current  antbiotic and dressing changes -will Increase clonidine to tid due to poorly controlled BP -pt to follow up with Cardiology in October as scheduled -reminded pt to bring all meds to every appointment.   -Pt to follow up here in 1 month.  RTO sooner pnr

## 2017-11-07 NOTE — Patient Instructions (Signed)
Financial [phone] Ask about [facility] [employer] and medicaid.   ----------------------------------------------------------

## 2017-11-11 ENCOUNTER — Ambulatory Visit: Payer: Self-pay | Admitting: Physician Assistant

## 2017-11-27 ENCOUNTER — Encounter (HOSPITAL_COMMUNITY): Payer: Self-pay

## 2017-11-30 ENCOUNTER — Encounter (HOSPITAL_COMMUNITY): Payer: Self-pay | Admitting: Emergency Medicine

## 2017-11-30 ENCOUNTER — Emergency Department (HOSPITAL_COMMUNITY): Payer: Medicaid Other

## 2017-11-30 ENCOUNTER — Inpatient Hospital Stay (HOSPITAL_COMMUNITY): Payer: Medicaid Other

## 2017-11-30 ENCOUNTER — Inpatient Hospital Stay (HOSPITAL_COMMUNITY)
Admission: EM | Admit: 2017-11-30 | Discharge: 2017-12-27 | DRG: 380 | Disposition: E | Payer: Medicaid Other | Attending: Internal Medicine | Admitting: Internal Medicine

## 2017-11-30 ENCOUNTER — Other Ambulatory Visit: Payer: Self-pay

## 2017-11-30 DIAGNOSIS — D649 Anemia, unspecified: Secondary | ICD-10-CM | POA: Diagnosis present

## 2017-11-30 DIAGNOSIS — Z8249 Family history of ischemic heart disease and other diseases of the circulatory system: Secondary | ICD-10-CM

## 2017-11-30 DIAGNOSIS — Z66 Do not resuscitate: Secondary | ICD-10-CM | POA: Diagnosis not present

## 2017-11-30 DIAGNOSIS — I5032 Chronic diastolic (congestive) heart failure: Secondary | ICD-10-CM | POA: Diagnosis present

## 2017-11-30 DIAGNOSIS — E872 Acidosis: Secondary | ICD-10-CM | POA: Diagnosis present

## 2017-11-30 DIAGNOSIS — N170 Acute kidney failure with tubular necrosis: Secondary | ICD-10-CM | POA: Diagnosis present

## 2017-11-30 DIAGNOSIS — I4892 Unspecified atrial flutter: Secondary | ICD-10-CM | POA: Diagnosis present

## 2017-11-30 DIAGNOSIS — E876 Hypokalemia: Secondary | ICD-10-CM | POA: Diagnosis present

## 2017-11-30 DIAGNOSIS — I429 Cardiomyopathy, unspecified: Secondary | ICD-10-CM | POA: Diagnosis present

## 2017-11-30 DIAGNOSIS — Z79899 Other long term (current) drug therapy: Secondary | ICD-10-CM

## 2017-11-30 DIAGNOSIS — M109 Gout, unspecified: Secondary | ICD-10-CM | POA: Diagnosis present

## 2017-11-30 DIAGNOSIS — L97909 Non-pressure chronic ulcer of unspecified part of unspecified lower leg with unspecified severity: Secondary | ICD-10-CM | POA: Diagnosis present

## 2017-11-30 DIAGNOSIS — Z4659 Encounter for fitting and adjustment of other gastrointestinal appliance and device: Secondary | ICD-10-CM

## 2017-11-30 DIAGNOSIS — K668 Other specified disorders of peritoneum: Secondary | ICD-10-CM | POA: Diagnosis present

## 2017-11-30 DIAGNOSIS — R7989 Other specified abnormal findings of blood chemistry: Secondary | ICD-10-CM | POA: Diagnosis present

## 2017-11-30 DIAGNOSIS — D631 Anemia in chronic kidney disease: Secondary | ICD-10-CM | POA: Diagnosis present

## 2017-11-30 DIAGNOSIS — N179 Acute kidney failure, unspecified: Secondary | ICD-10-CM

## 2017-11-30 DIAGNOSIS — N184 Chronic kidney disease, stage 4 (severe): Secondary | ICD-10-CM | POA: Diagnosis present

## 2017-11-30 DIAGNOSIS — N186 End stage renal disease: Secondary | ICD-10-CM | POA: Diagnosis present

## 2017-11-30 DIAGNOSIS — I10 Essential (primary) hypertension: Secondary | ICD-10-CM | POA: Diagnosis present

## 2017-11-30 DIAGNOSIS — E46 Unspecified protein-calorie malnutrition: Secondary | ICD-10-CM | POA: Diagnosis present

## 2017-11-30 DIAGNOSIS — I272 Pulmonary hypertension, unspecified: Secondary | ICD-10-CM | POA: Diagnosis present

## 2017-11-30 DIAGNOSIS — Z6838 Body mass index (BMI) 38.0-38.9, adult: Secondary | ICD-10-CM

## 2017-11-30 DIAGNOSIS — M898X9 Other specified disorders of bone, unspecified site: Secondary | ICD-10-CM | POA: Diagnosis present

## 2017-11-30 DIAGNOSIS — Z808 Family history of malignant neoplasm of other organs or systems: Secondary | ICD-10-CM

## 2017-11-30 DIAGNOSIS — Z7901 Long term (current) use of anticoagulants: Secondary | ICD-10-CM

## 2017-11-30 DIAGNOSIS — Z7189 Other specified counseling: Secondary | ICD-10-CM

## 2017-11-30 DIAGNOSIS — N189 Chronic kidney disease, unspecified: Secondary | ICD-10-CM

## 2017-11-30 DIAGNOSIS — K275 Chronic or unspecified peptic ulcer, site unspecified, with perforation: Principal | ICD-10-CM | POA: Diagnosis present

## 2017-11-30 DIAGNOSIS — E871 Hypo-osmolality and hyponatremia: Secondary | ICD-10-CM | POA: Diagnosis present

## 2017-11-30 DIAGNOSIS — R1011 Right upper quadrant pain: Secondary | ICD-10-CM | POA: Diagnosis present

## 2017-11-30 DIAGNOSIS — Z0189 Encounter for other specified special examinations: Secondary | ICD-10-CM

## 2017-11-30 DIAGNOSIS — F419 Anxiety disorder, unspecified: Secondary | ICD-10-CM | POA: Diagnosis present

## 2017-11-30 DIAGNOSIS — I132 Hypertensive heart and chronic kidney disease with heart failure and with stage 5 chronic kidney disease, or end stage renal disease: Secondary | ICD-10-CM | POA: Diagnosis present

## 2017-11-30 DIAGNOSIS — K7460 Unspecified cirrhosis of liver: Secondary | ICD-10-CM | POA: Diagnosis present

## 2017-11-30 DIAGNOSIS — N83202 Unspecified ovarian cyst, left side: Secondary | ICD-10-CM | POA: Diagnosis present

## 2017-11-30 DIAGNOSIS — I4819 Other persistent atrial fibrillation: Secondary | ICD-10-CM | POA: Diagnosis present

## 2017-11-30 DIAGNOSIS — Z515 Encounter for palliative care: Secondary | ICD-10-CM | POA: Diagnosis not present

## 2017-11-30 DIAGNOSIS — R778 Other specified abnormalities of plasma proteins: Secondary | ICD-10-CM | POA: Diagnosis present

## 2017-11-30 DIAGNOSIS — I4891 Unspecified atrial fibrillation: Secondary | ICD-10-CM

## 2017-11-30 DIAGNOSIS — I953 Hypotension of hemodialysis: Secondary | ICD-10-CM | POA: Diagnosis not present

## 2017-11-30 DIAGNOSIS — K631 Perforation of intestine (nontraumatic): Secondary | ICD-10-CM | POA: Diagnosis present

## 2017-11-30 DIAGNOSIS — Z23 Encounter for immunization: Secondary | ICD-10-CM

## 2017-11-30 DIAGNOSIS — N2889 Other specified disorders of kidney and ureter: Secondary | ICD-10-CM | POA: Diagnosis present

## 2017-11-30 DIAGNOSIS — R1010 Upper abdominal pain, unspecified: Secondary | ICD-10-CM

## 2017-11-30 DIAGNOSIS — Z880 Allergy status to penicillin: Secondary | ICD-10-CM

## 2017-11-30 DIAGNOSIS — Z8051 Family history of malignant neoplasm of kidney: Secondary | ICD-10-CM

## 2017-11-30 LAB — COMPREHENSIVE METABOLIC PANEL
ALK PHOS: 65 U/L (ref 38–126)
ALT: <5 U/L (ref 0–44)
ANION GAP: 13 (ref 5–15)
AST: 10 U/L — ABNORMAL LOW (ref 15–41)
Albumin: 2.8 g/dL — ABNORMAL LOW (ref 3.5–5.0)
BUN: 41 mg/dL — ABNORMAL HIGH (ref 6–20)
CALCIUM: 8.2 mg/dL — AB (ref 8.9–10.3)
CO2: 15 mmol/L — AB (ref 22–32)
Chloride: 113 mmol/L — ABNORMAL HIGH (ref 98–111)
Creatinine, Ser: 2.66 mg/dL — ABNORMAL HIGH (ref 0.44–1.00)
GFR calc non Af Amer: 21 mL/min — ABNORMAL LOW (ref >60)
GFR, EST AFRICAN AMERICAN: 24 mL/min — AB (ref >60)
Glucose, Bld: 122 mg/dL — ABNORMAL HIGH (ref 70–99)
Potassium: 3.2 mmol/L — ABNORMAL LOW (ref 3.5–5.1)
SODIUM: 141 mmol/L (ref 135–145)
Total Bilirubin: 1.1 mg/dL (ref 0.3–1.2)
Total Protein: 8.4 g/dL — ABNORMAL HIGH (ref 6.5–8.1)

## 2017-11-30 LAB — URINALYSIS, ROUTINE W REFLEX MICROSCOPIC
Bilirubin Urine: NEGATIVE
Glucose, UA: NEGATIVE mg/dL
HGB URINE DIPSTICK: NEGATIVE
Ketones, ur: NEGATIVE mg/dL
LEUKOCYTES UA: NEGATIVE
Nitrite: NEGATIVE
PROTEIN: 30 mg/dL — AB
Specific Gravity, Urine: 1.014 (ref 1.005–1.030)
pH: 5 (ref 5.0–8.0)

## 2017-11-30 LAB — BRAIN NATRIURETIC PEPTIDE: B Natriuretic Peptide: 328 pg/mL — ABNORMAL HIGH (ref 0.0–100.0)

## 2017-11-30 LAB — CBC
HCT: 27.3 % — ABNORMAL LOW (ref 36.0–46.0)
Hemoglobin: 8.4 g/dL — ABNORMAL LOW (ref 12.0–15.0)
MCH: 29.5 pg (ref 26.0–34.0)
MCHC: 30.8 g/dL (ref 30.0–36.0)
MCV: 95.8 fL (ref 78.0–100.0)
Platelets: 346 10*3/uL (ref 150–400)
RBC: 2.85 MIL/uL — AB (ref 3.87–5.11)
RDW: 15.3 % (ref 11.5–15.5)
WBC: 11.9 10*3/uL — AB (ref 4.0–10.5)

## 2017-11-30 LAB — PROTIME-INR
INR: 1.48
PROTHROMBIN TIME: 17.8 s — AB (ref 11.4–15.2)

## 2017-11-30 LAB — I-STAT BETA HCG BLOOD, ED (MC, WL, AP ONLY)

## 2017-11-30 LAB — TROPONIN I
TROPONIN I: 0.05 ng/mL — AB (ref <0.03)
Troponin I: 0.05 ng/mL (ref <0.03)

## 2017-11-30 LAB — LIPASE, BLOOD: Lipase: 49 U/L (ref 11–51)

## 2017-11-30 MED ORDER — SODIUM CHLORIDE 0.9 % IV SOLN
2.0000 g | Freq: Once | INTRAVENOUS | Status: AC
  Administered 2017-11-30: 2 g via INTRAVENOUS
  Filled 2017-11-30: qty 2

## 2017-11-30 MED ORDER — FAMOTIDINE 20 MG PO TABS
20.0000 mg | ORAL_TABLET | Freq: Once | ORAL | Status: AC
  Administered 2017-11-30: 20 mg via ORAL
  Filled 2017-11-30: qty 1

## 2017-11-30 MED ORDER — MAGNESIUM SULFATE IN D5W 1-5 GM/100ML-% IV SOLN
1.0000 g | Freq: Once | INTRAVENOUS | Status: AC
  Administered 2017-12-01: 1 g via INTRAVENOUS
  Filled 2017-11-30: qty 100

## 2017-11-30 MED ORDER — SODIUM CHLORIDE 0.9% FLUSH
3.0000 mL | Freq: Two times a day (BID) | INTRAVENOUS | Status: DC
  Administered 2017-12-01 – 2017-12-04 (×7): 3 mL via INTRAVENOUS

## 2017-11-30 MED ORDER — POTASSIUM CHLORIDE IN NACL 40-0.9 MEQ/L-% IV SOLN
INTRAVENOUS | Status: AC
  Administered 2017-11-30: 75 mL/h via INTRAVENOUS
  Filled 2017-11-30: qty 1000

## 2017-11-30 MED ORDER — ONDANSETRON HCL 4 MG/2ML IJ SOLN
4.0000 mg | Freq: Four times a day (QID) | INTRAMUSCULAR | Status: DC | PRN
  Administered 2017-12-07 (×2): 4 mg via INTRAVENOUS
  Filled 2017-11-30 (×2): qty 2

## 2017-11-30 MED ORDER — ONDANSETRON HCL 4 MG PO TABS
4.0000 mg | ORAL_TABLET | Freq: Four times a day (QID) | ORAL | Status: DC | PRN
  Administered 2017-12-06: 4 mg via ORAL
  Filled 2017-11-30: qty 1

## 2017-11-30 MED ORDER — HYDROMORPHONE HCL 1 MG/ML IJ SOLN
1.0000 mg | Freq: Once | INTRAMUSCULAR | Status: AC
  Administered 2017-11-30: 1 mg via INTRAVENOUS
  Filled 2017-11-30: qty 1

## 2017-11-30 MED ORDER — MORPHINE SULFATE (PF) 4 MG/ML IV SOLN
4.0000 mg | Freq: Once | INTRAVENOUS | Status: AC
  Administered 2017-11-30: 4 mg via INTRAVENOUS
  Filled 2017-11-30: qty 1

## 2017-11-30 MED ORDER — DIATRIZOATE MEGLUMINE & SODIUM 66-10 % PO SOLN: 30.0000 mL | Freq: Once | ORAL | Status: DC

## 2017-11-30 MED ORDER — LORAZEPAM 2 MG/ML IJ SOLN
1.0000 mg | Freq: Once | INTRAMUSCULAR | Status: AC
  Administered 2017-11-30: 1 mg via INTRAVENOUS
  Filled 2017-11-30: qty 1

## 2017-11-30 MED ORDER — METRONIDAZOLE IN NACL 5-0.79 MG/ML-% IV SOLN
500.0000 mg | Freq: Three times a day (TID) | INTRAVENOUS | Status: DC
  Administered 2017-11-30 – 2017-12-04 (×12): 500 mg via INTRAVENOUS
  Filled 2017-11-30 (×12): qty 100

## 2017-11-30 MED ORDER — FUROSEMIDE 10 MG/ML IJ SOLN
40.0000 mg | Freq: Once | INTRAMUSCULAR | Status: AC
  Administered 2017-11-30: 40 mg via INTRAVENOUS
  Filled 2017-11-30: qty 4

## 2017-11-30 MED ORDER — ACETAMINOPHEN 325 MG PO TABS: 650.0000 mg | ORAL_TABLET | Freq: Four times a day (QID) | ORAL | Status: DC | PRN

## 2017-11-30 MED ORDER — IOPAMIDOL (ISOVUE-300) INJECTION 61%
30.0000 mL | Freq: Once | INTRAVENOUS | Status: AC | PRN
  Administered 2017-11-30: 30 mL via ORAL

## 2017-11-30 MED ORDER — GI COCKTAIL ~~LOC~~
30.0000 mL | Freq: Once | ORAL | Status: AC
  Administered 2017-11-30: 30 mL via ORAL
  Filled 2017-11-30: qty 30

## 2017-11-30 MED ORDER — FENTANYL CITRATE (PF) 100 MCG/2ML IJ SOLN
25.0000 ug | INTRAMUSCULAR | Status: DC | PRN
  Administered 2017-12-01: 25 ug via INTRAVENOUS
  Administered 2017-12-01: 50 ug via INTRAVENOUS
  Administered 2017-12-01 – 2017-12-03 (×10): 25 ug via INTRAVENOUS
  Administered 2017-12-03 (×2): 50 ug via INTRAVENOUS
  Filled 2017-11-30 (×14): qty 2

## 2017-11-30 MED ORDER — HYDROMORPHONE HCL 1 MG/ML IJ SOLN
0.5000 mg | Freq: Once | INTRAMUSCULAR | Status: AC
  Administered 2017-11-30: 0.5 mg via INTRAVENOUS
  Filled 2017-11-30: qty 1

## 2017-11-30 MED ORDER — SODIUM CHLORIDE 0.9 % IV BOLUS
1000.0000 mL | Freq: Once | INTRAVENOUS | Status: AC
  Administered 2017-11-30: 1000 mL via INTRAVENOUS

## 2017-11-30 MED ORDER — SODIUM CHLORIDE 0.9 % IV SOLN
2.0000 g | INTRAVENOUS | Status: DC
  Administered 2017-12-01: 2 g via INTRAVENOUS
  Filled 2017-11-30 (×2): qty 2

## 2017-11-30 MED ORDER — ACETAMINOPHEN 650 MG RE SUPP: 650.0000 mg | Freq: Four times a day (QID) | RECTAL | Status: DC | PRN

## 2017-11-30 MED ORDER — HYDRALAZINE HCL 20 MG/ML IJ SOLN: 10.0000 mg | INTRAMUSCULAR | Status: DC | PRN

## 2017-11-30 MED ORDER — DILTIAZEM LOAD VIA INFUSION
10.0000 mg | Freq: Once | INTRAVENOUS | Status: AC
  Administered 2017-11-30: 10 mg via INTRAVENOUS
  Filled 2017-11-30: qty 10

## 2017-11-30 MED ORDER — SODIUM CHLORIDE 0.9 % IV BOLUS
500.0000 mL | Freq: Once | INTRAVENOUS | Status: AC
  Administered 2017-11-30: 500 mL via INTRAVENOUS

## 2017-11-30 MED ORDER — PANTOPRAZOLE SODIUM 40 MG IV SOLR
40.0000 mg | Freq: Two times a day (BID) | INTRAVENOUS | Status: DC
  Administered 2017-12-01 – 2017-12-04 (×8): 40 mg via INTRAVENOUS
  Filled 2017-11-30 (×8): qty 40

## 2017-11-30 MED ORDER — DILTIAZEM HCL-DEXTROSE 100-5 MG/100ML-% IV SOLN (PREMIX)
5.0000 mg/h | INTRAVENOUS | Status: DC
  Administered 2017-11-30: 5 mg/h via INTRAVENOUS
  Administered 2017-12-01 – 2017-12-04 (×8): 7.5 mg/h via INTRAVENOUS
  Filled 2017-11-30 (×9): qty 100

## 2017-11-30 NOTE — ED Triage Notes (Addendum)
Patient brought in via EMS from home. Alert and oriented. Patient c/o right upper abd pain and right shoulder pain that woke her from her sleep. Denies any nausea, vomiting, diarrhea, fevers, shortness of breath, or urinary symptoms.  Denies any Gi hx. Patient does have significant swelling to let leg and wound to right leg that she states "has been like that for a while."

## 2017-11-30 NOTE — H&P (Signed)
History and Physical    JIA DOTTAVIO QIO:[id number] DOB: [birth year] DOA: 12/07/2017  PCP: Soyla Dryer, PA-C   Patient coming from: Home   Chief Complaint: Upper abdominal pain   HPI: [name] is a 47 y.o. female with medical history significant for atrial fibrillation on Eliquis, chronic diastolic CHF, hypertension, CKD IV, and chronic anemia, now presenting to the emergency department for evaluation of acute upper abdominal pain.  Patient reports that she developed acute onset of pain in the right upper quadrant this morning with radiation towards the right shoulder.  Pain is rated as severe, constant, with no alleviating or exacerbating factors identified, and without vomiting, diarrhea, fevers, or chills.  She denies any chest pain or palpitations and denies any change in her chronic dyspnea.  She has never experienced these symptoms previously.  Denies melena or hematochezia.  ED Course: Upon arrival to the ED, patient is found to be afebrile, saturating adequately on room air, tachycardic in the 110s to 130s, and with vitals otherwise stable.  EKG features atrial fibrillation with nonspecific IVCD.  Chest x-ray is notable for cardiomegaly.  Chemistry panel features a potassium 3.2, bicarbonate of 15, and creatinine of 2.66, similar to priors.  CBC is notable for leukocytosis to 11,900 and a stable chronic normocytic anemia with hemoglobin 8.4.  Troponin is mildly elevated to 0.05 and BNP is elevated to 328.  CT the abdomen and pelvis is concerning for pneumoperitoneum with source not identified, cirrhotic appearance to the liver, left renal mass, and suspected left ovarian cyst. She was started on diltiazem infusion and treated with IVF, Lasix, Ativan, morphine, Dilaudid, Pepcid, cefepime, and Flagyl. Surgery was consulted by the ED physician and recommends n.p.o. and NG tube.  Review of Systems:  All other systems reviewed and apart from HPI, are negative.  Past Medical  History:  Diagnosis Date  . Anemia   . Atrial flutter ([place])   . Cellulitis   . CHF (congestive heart failure) ([place])   . Chronic diastolic heart failure ([place])    a. Amyloidosis work-up negative 2013  b. 12/07/2014 ECHO EF 55-60%. LA severely dilated. RV normal RA moderately dilated, Severe LVH.IVC dilated.   . CKD (chronic kidney disease) stage 3, GFR 30-59 ml/min ([facility])   . Essential hypertension, benign   . Gout   . Gout   . History of cardiac catheterization    a. ectatic coronaries without obstruction 2006 - [place]  . Hypertensive heart disease   . Morbid obesity ([place])   . Persistent atrial fibrillation   . Pulmonary hypertension ([place])     Past Surgical History:  Procedure Laterality Date  . CARDIOVERSION  01/15/2012   Procedure: CARDIOVERSION;  Surgeon: Jolaine Artist, MD;  Location: [facility];  Service: Cardiovascular;  Laterality: N/A;  . RIGHT HEART CATH N/A 02/13/2017   Procedure: RIGHT HEART CATH;  Surgeon: Jolaine Artist, MD;  Location: [facility];  Service: Cardiovascular;  Laterality: N/A;  . RIGHT HEART CATHETERIZATION N/A 12/26/2011   Procedure: RIGHT HEART CATH;  Surgeon: Jolaine Artist, MD;  Location: [facility] CATH [facility];  Service: Cardiovascular;  Laterality: N/A;  . SKIN BIOPSY  12/18/2011   Procedure: BIOPSY SKIN;  Surgeon: Donato Heinz, MD;  Location: [facility];  Service: General;  Laterality: N/A;  in the minor room.  . TEE WITHOUT CARDIOVERSION  01/15/2012   Procedure: TRANSESOPHAGEAL ECHOCARDIOGRAM (TEE);  Surgeon: Jolaine Artist, MD;  Location: [facility];  Service: Cardiovascular;  Laterality: N/A;  cardioversion/on xarelto     reports that she has never smoked. She has never used smokeless tobacco. She reports that she does not drink alcohol or use drugs.  Allergies  Allergen Reactions  . Penicillins Hives and Other (See Comments)    Childhood allergy. Has patient had a PCN reaction causing immediate rash, facial/tongue/throat  swelling, SOB or lightheadedness with hypotension: NO Has patient had a PCN reaction causing severe rash involving mucus membranes or skin necrosis: No NO Has patient had a PCN reaction that required hospitalization No NO Has patient had a PCN reaction occurring within the last 10 years: NO If all of the above answers are "NO", then may pro    Family History  Problem Relation Age of Onset  . Hypertension Mother   . Diabetes Mother   . Heart attack Mother   . Heart attack Father   . Hypertension Sister   . Asthma Sister   . Hypertension Maternal Aunt   . Hypertension Maternal Uncle   . Hypertension Sister   . Cancer Sister        thyroid and Kidney cancer     Prior to Admission medications   Medication Sig Start Date End Date Taking? Authorizing Provider  amLODipine (NORVASC) 10 MG tablet TAKE 1 TABLET BY MOUTH DAILY. 09/23/17  Yes Bensimhon, Shaune Pascal, MD  apixaban (ELIQUIS) 5 MG TABS tablet Take 1 tablet (5 mg total) by mouth 2 (two) times daily. 11/23/16  Yes Jettie Booze E, NP  cloNIDine (CATAPRES) 0.3 MG tablet TAKE 1 TABLET BY MOUTH 2 TIMES DAILY. MAY TAKE AN ADDITIONAL TABLET AS NEEDED FOR SBP>180 09/23/17  Yes Bensimhon, Shaune Pascal, MD  hydrALAZINE (APRESOLINE) 100 MG tablet Take 1 tablet (100 mg total) by mouth 3 (three) times daily. 05/16/17  Yes Shirley Friar, PA-C  naproxen sodium (ALEVE) 220 MG tablet Take 220 mg by mouth daily as needed (for pain).   Yes [provider]  potassium chloride SA (K-DUR,KLOR-CON) 20 MEQ tablet TAKE 1 TABLET BY MOUTH 2 TIMES DAILY. 09/23/17  Yes Bensimhon, Shaune Pascal, MD  sodium hypochlorite (DAKIN'S 1/4 STRENGTH) 0.125 % SOLN Irrigate with as directed 2 (two) times daily. 10/26/17  Yes Kathie Dike, MD  spironolactone (ALDACTONE) 25 MG tablet Take 0.5 tablets (12.5 mg total) by mouth daily. 05/13/17  Yes Bensimhon, Shaune Pascal, MD  torsemide (DEMADEX) 20 MG tablet Take 2 tablets (40 mg total) by mouth 2 (two) times daily. Take extra 20  mg tablet once daily as needed for 3 lb weight gain. 07/31/17  Yes Clegg, Amy D, NP    Physical Exam: Vitals:   12/04/2017 1945 11/28/2017 2000 11/28/2017 2015 11/28/2017 2030  BP: (!) 144/79 (!) 164/93 (!) 153/98 126/73  Pulse: 94     Resp: (!) 27 (!) 28 (!) 26 (!) 22  Temp:      TempSrc:      SpO2: 100%     Weight:      Height:        Constitutional: NAD, appears chronically-ill and older than stated age Eyes: PERTLA, lids and conjunctivae normal ENMT: Mucous membranes are moist. Posterior pharynx clear of any exudate or lesions.   Neck: normal, supple, no masses, no thyromegaly Respiratory: clear to auscultation bilaterally, no wheezing, no crackles. Normal respiratory effort.   Cardiovascular: Rate ~110 and irregualr. 2+ pretibial edema bialterally. Abdomen: Soft, tender in mid and upper abdomen, no rebound pain or guarding. Bowel sounds active.  Musculoskeletal: no clubbing / cyanosis. No joint  deformity upper and lower extremities. Edematous but nontender left olecranon bursa.  Skin: Chronic distal LE ulcerations. Warm, dry, well-perfused. Neurologic: No facial assymetry. Sensation intact. Moving all extremities.  Psychiatric: Aert and oriented x 3. Calm, cooperative.    Labs on Admission: I have personally reviewed following labs and imaging studies  CBC: Recent Labs  Lab 12/01/2017 0925  WBC 11.9*  HGB 8.4*  HCT 27.3*  MCV 95.8  PLT 595   Basic Metabolic Panel: Recent Labs  Lab 12/06/2017 0925  NA 141  K 3.2*  CL 113*  CO2 15*  GLUCOSE 122*  BUN 41*  CREATININE 2.66*  CALCIUM 8.2*   GFR: Estimated Creatinine Clearance: 37.9 mL/min (A) (by C-G formula based on SCr of 2.66 mg/dL (H)). Liver Function Tests: Recent Labs  Lab 11/29/2017 0925  AST 10*  ALT <5  ALKPHOS 65  BILITOT 1.1  PROT 8.4*  ALBUMIN 2.8*   Recent Labs  Lab 12/19/2017 0925  LIPASE 49   No results for input(s): AMMONIA in the last 168 hours. Coagulation Profile: No results for input(s):  INR, PROTIME in the last 168 hours. Cardiac Enzymes: Recent Labs  Lab 11/27/2017 0925 12/15/2017 1442  TROPONINI 0.05* 0.05*   BNP (last 3 results) No results for input(s): PROBNP in the last 8760 hours. HbA1C: No results for input(s): HGBA1C in the last 72 hours. CBG: No results for input(s): GLUCAP in the last 168 hours. Lipid Profile: No results for input(s): CHOL, HDL, LDLCALC, TRIG, CHOLHDL, LDLDIRECT in the last 72 hours. Thyroid Function Tests: No results for input(s): TSH, T4TOTAL, FREET4, T3FREE, THYROIDAB in the last 72 hours. Anemia Panel: No results for input(s): VITAMINB12, FOLATE, FERRITIN, TIBC, IRON, RETICCTPCT in the last 72 hours. Urine analysis:    Component Value Date/Time   COLORURINE YELLOW 12/22/2017 1120   APPEARANCEUR CLEAR 12/14/2017 1120   LABSPEC 1.014 11/29/2017 1120   PHURINE 5.0 12/22/2017 1120   GLUCOSEU NEGATIVE 11/26/2017 1120   HGBUR NEGATIVE 12/26/2017 1120   BILIRUBINUR NEGATIVE 11/27/2017 1120   KETONESUR NEGATIVE 12/22/2017 1120   PROTEINUR 30 (A) 12/05/2017 1120   UROBILINOGEN 0.2 12/07/2014 0014   NITRITE NEGATIVE 12/10/2017 1120   LEUKOCYTESUR NEGATIVE 12/14/2017 1120   Sepsis Labs: @LABRCNTIP (procalcitonin:4,lacticidven:4) )No results found for this or any previous visit (from the past 240 hour(s)).   Radiological Exams on Admission: Ct Abdomen Pelvis Wo Contrast  Result Date: 12/03/2017 CLINICAL DATA:  Acute abdominal pain. EXAM: CT ABDOMEN AND PELVIS WITHOUT CONTRAST TECHNIQUE: Multidetector CT imaging of the abdomen and pelvis was performed following the standard protocol without IV contrast. COMPARISON:  CT scan January 02, 2012 FINDINGS: Lower chest: Small calcified granulomas in the lung bases. No other acute abnormalities in the lung bases. Cardiomegaly. Small pericardial effusion. Coronary artery calcifications. Hepatobiliary: The liver demonstrates a nodular contour suggesting cirrhosis. No liver masses noted. The gallbladder  is mildly distended but otherwise normal. Right upper quadrant ultrasound from today demonstrated no gallbladder abnormalities. Pancreas: Unremarkable. No pancreatic ductal dilatation or surrounding inflammatory changes. Spleen: Normal in size without focal abnormality. Adrenals/Urinary Tract: The adrenal glands are normal. There appear to be 2 right renal cysts. I suspect a small cyst in the left kidney as well. An exophytic mass off the left kidney on series 2, image 38 demonstrates an attenuation of 33 Hounsfield units and this mass measures 10 mm. No hydronephrosis. A few small stones are seen in the right kidney. No ureterectasis or ureteral stone. The bladder is unremarkable. Stomach/Bowel: The stomach and  small bowel is normal with no obstruction. The colon is unremarkable. Visualized portions of the appendix are normal. Vascular/Lymphatic: Atherosclerotic changes are seen in the nonaneurysmal aorta. Shotty nodes are seen in the retroperitoneum and gastrohepatic ligament. No gross adenopathy. Reproductive: The left ovary is somewhat enlarged measuring 6.5 cm. A rounded region of decreased echogenicity within the ovary demonstrates an attenuation of 40 Hounsfield units. The right ovary is normal in appearance. The uterus is unremarkable. Other: Free air seen in the abdomen with no identified cause. There is ascites as well, particularly adjacent to the liver and spleen, in the pericolic gutters, and in the pelvis. Musculoskeletal: No acute or significant osseous findings. IMPRESSION: 1. Free air in the abdomen suggests perforated viscus. An underlying cause is not seen. 2. Moderate ascites in the abdomen and pelvis. 3. Enlarged left ovary containing a 5 cm rounded hypoechoic region. The hypoechoic region could represent a hemorrhagic cyst but is nonspecific. Recommend pelvic ultrasound for further evaluation of the left ovary. 4. Cirrhotic liver. 5. 10 mm exophytic mass off the left kidney with an attenuation  of 33 Hounsfield units. This could represent a solid mass or hyperdense cyst. An ultrasound may further characterize when the patient is able. 6. Nonobstructive renal stones. 7. Atherosclerotic changes in the nonaneurysmal aorta. Findings called to Dr. Ashok Cordia. Electronically Signed   By: Dorise Bullion III M.D   On: 12/21/2017 20:50   Dg Chest 2 View  Result Date: 12/21/2017 CLINICAL DATA:  Shortness of breath, RIGHT upper abdominal pain, RIGHT shoulder pain, awoke patient from sleep, history CHF, essential benign hypertension, atrial fibrillation, pulmonary hypertension EXAM: CHEST - 2 VIEW COMPARISON:  04/04/2017 FINDINGS: Enlargement of cardiac silhouette. Mediastinal contours and pulmonary vascularity normal. Mild RIGHT basilar atelectasis. Chronic accentuation of pulmonary markings, stable. No acute infiltrate, pleural effusion or pneumothorax. Bones unremarkable. IMPRESSION: Enlargement of cardiac silhouette with RIGHT basilar atelectasis. Electronically Signed   By: Lavonia Dana M.D.   On: 12/17/2017 17:31   US Abdomen Limited Ruq  Result Date: 12/04/2017 CLINICAL DATA:  Right upper quadrant abdominal pain. Chronic kidney disease and hypertension. EXAM: ULTRASOUND ABDOMEN LIMITED RIGHT UPPER QUADRANT COMPARISON:  Renal ultrasound dated 06/30/2016 and abdomen and pelvis CT dated 01/02/2012 FINDINGS: Gallbladder: No gallstones or wall thickening visualized. No sonographic Murphy sign noted by sonographer. Common bile duct: Diameter: 4.1 mm Liver: Mildly irregular borders. Normal echotexture. Portal vein is patent on color Doppler imaging with normal direction of blood flow towards the liver. Also noted is a markedly echogenic right kidney with progressive increased echogenicity, containing a small cyst. IMPRESSION: 1. No acute abnormality. 2. Markedly echogenic right kidney with progression, compatible with medical renal disease. Electronically Signed   By: Claudie Revering M.D.   On: 12/18/2017 10:27     EKG: Independently reviewed. Atrial fibrillation, non-specific IVCD.   Assessment/Plan   1. Pneumoperitoneum   - Presents with acute-onset of RUQ abdominal pain  - CT with pneumoperitoneum of unclear etiology  - She was given empiric IV antibiotics in ED   - Surgery is consulting and much appreciated  - Continue empiric abx, hold Eliquis, bowel rest, NGT, analgesia and antiemetics, IV PPI, and gentle IVF hydration    2. Atrial fibrillation  - In atrial fibrillation in ED with rates in 110-130's and she was started on diltiazem infusion  - CHADS-VASc at least 3 (gender, CHF, HTN)  - Hold Eliquis, continue cardiac monitoring, continue diltiazem infusion with titration    3. Chronic diastolic CHF  -  Appears compensated  - Preserved EF on echo from last year  - Diuretics held on admission and providing gentle IVF hydration given acute illness and NPO status  - Follow daily wt and strict I/O's    4. CKD stage IV  - SCr is 2.66 on admission, appears to be her baseline  - Renally-dose medications, hold diuretics, follow fluid-status closely, repeat chem panel in am, consider bicarbonate therapy  5. Anemia of CKD  - Hgb is 8.4 on admission  - No active bleeding and counts appear stable  - Type and screen    6. Chronic leg ulcers  - Continue wound care    7. Hypertension  - BP at goal  - Antihypertensives held on admission given the acute illness and NPO status  - Use hydralazine IVP's as needed for now    8. Elevated troponin  - Troponin slightly elevated at 0.05  - No anginal complaints  - Continue cardiac monitoring and trend troponin    9. Hypokalemia  - Serum potassium is 3.2 on admission  - KCl added to IVF, magnesium given empirically  - Repeat chem panel in am    10. Left renal mass  - Noted incidentally on CT abd/pelvis  - Consider further eval with non-emergent [place]  11. Left adnexal lesion  - Noted incidentally on CT abd/pelvis  - Consider further eval  with non-emergent [place]    DVT prophylaxis: Eliquis prior to arrival, held on admission  Code Status: Full  Family Communication: Discussed with patient  Consults called: Surgery  Admission status: Inpatient     Vianne Bulls, MD [facility] Pager [phone]  If 7PM-7AM, please contact night-coverage [email or web address] Password [email or web address]  11/27/2017, 10:12 PM

## 2017-11-30 NOTE — ED Provider Notes (Addendum)
Patient signed out to me at 1600 that ct was pending (order around 10 am).   I was notified at 1800 that pt refusing CT, that radiology has tried a couple times to complete.   I spoke with pt - pt indicates unwilling to have ct done.  States had trouble breathing when laid down for ct, reassurance and meds offered, and discussed reasons for ct (concern for intrabdominal infection, inflammation, perforation, or other acute process causing her pain) -  and pt is refusing repeat attempts at CT. Pt refusing CT/advanced imaging - will try meds for symptom relief.   Pt with hx afib, no current rate control meds, is anticoagulated - hr is 130's. cardizem iv for rate control.   I re-examined pt, pt notes persistent upper abd pain. Has upper abd tenderness on exam, mild-mod. No frank peritoneal findings on exam. I again discussed need for ct, concern for possible abd infection and/or other acute causes of her pain.  Pt now indicates with meds given, she will try again to get ct done.   CT report called to me at 2045 of free air on ct - gen surgery paged. Iv abx given.   Discussed pt with Dr Arnoldo Morale - he will come into ED to see, request npo, ng tube, hospitalists to see/admit to unit.       Lajean Saver, MD 11/29/2017 2117

## 2017-11-30 NOTE — ED Notes (Signed)
Pt placed on purewick 

## 2017-11-30 NOTE — Progress Notes (Signed)
Pharmacy Antibiotic Note  [name] is a 47 y.o. female admitted on 11/29/2017 with intra abdominal infection.  Pharmacy has been consulted for cefepime dosing.  Plan: cefepime 2gm iv q24h  Height: 5\' 10"  (177.8 cm) Weight: 269 lb (122 kg) IBW/kg (Calculated) : 68.5  Temp (24hrs), Avg:97.6 F (36.4 C), Min:97.4 F (36.3 C), Max:97.8 F (36.6 C)  Recent Labs  Lab 12/21/2017 0925  WBC 11.9*  CREATININE 2.66*    Estimated Creatinine Clearance: 37.9 mL/min (A) (by C-G formula based on SCr of 2.66 mg/dL (H)).    Allergies  Allergen Reactions  . Penicillins Hives and Other (See Comments)    Childhood allergy. Has patient had a PCN reaction causing immediate rash, facial/tongue/throat swelling, SOB or lightheadedness with hypotension: NO Has patient had a PCN reaction causing severe rash involving mucus membranes or skin necrosis: No NO Has patient had a PCN reaction that required hospitalization No NO Has patient had a PCN reaction occurring within the last 10 years: NO If all of the above answers are "NO", then may pro    Antimicrobials this admission: 10/5 cefepime >>  10/5 flagyl >>   Microbiology results: No cultures ordered yet   Thank you for allowing pharmacy to be a part of this patient's care.  Donna Christen Ananya Mccleese 12/03/2017 9:20 PM

## 2017-11-30 NOTE — ED Notes (Signed)
Pt has deep pressure injury to right lower extremity. Pt sees a wound specialist about wound

## 2017-11-30 NOTE — ED Provider Notes (Signed)
[facility] EMERGENCY DEPARTMENT Provider Note   CSN: [id number] Arrival date & time: 12/15/2017  0865     History   Chief Complaint Chief Complaint  Patient presents with  . Abdominal Pain    HPI [name] is a 47 y.o. female.  47 year old female with multiple medical problems including a flutter on Eliquis, CHF CKD.  She presents to the emergency department by ambulance for acute onset of upper abdominal pain into right shoulder since 8 AM this morning.  She rates the pain as 10 out of 10 sharp stabbing.  She is never had this before.  It is not associated with any shortness of breath nausea vomiting diarrhea or urinary symptoms.  She can think of no precipitating factors for it.  She is tried nothing to relieve her pain.  She also has a large ulceration on her right lower leg that is been going on for months that she said she gets wound care for.  She is not complaining of this at all.  The history is provided by the patient.  Abdominal Pain   This is a new problem. The current episode started 1 to 2 hours ago. The problem occurs constantly. The problem has not changed since onset.The pain is associated with an unknown factor. The pain is located in the RUQ. The quality of the pain is sharp. The pain is at a severity of 10/10. Pertinent negatives include fever, diarrhea, melena, nausea, vomiting, dysuria, frequency, hematuria and headaches. Nothing aggravates the symptoms. Nothing relieves the symptoms.    Past Medical History:  Diagnosis Date  . Anemia   . Atrial flutter ([place])   . Cellulitis   . CHF (congestive heart failure) ([place])   . Chronic diastolic heart failure ([place])    a. Amyloidosis work-up negative 2013  b. 12/07/2014 ECHO EF 55-60%. LA severely dilated. RV normal RA moderately dilated, Severe LVH.IVC dilated.   . CKD (chronic kidney disease) stage 3, GFR 30-59 ml/min (HCC)   . Essential hypertension, benign   . Gout   . Gout   . History of cardiac  catheterization    a. ectatic coronaries without obstruction 2006 - [place]  . Hypertensive heart disease   . Morbid obesity ([place])   . Persistent atrial fibrillation   . Pulmonary hypertension [facility])     Patient Active Problem List   Diagnosis Date Noted  . Skin ulcer with fat layer exposed ([place])   . Cellulitis 10/23/2017  . CKD (chronic kidney disease) stage 4, GFR 15-29 ml/min (HCC) 10/23/2017  . Persistent atrial fibrillation 10/23/2017  . Wound infection 10/23/2017  . Anemia in chronic kidney disease (CKD) 10/23/2017  . Gout 10/23/2017  . Acute pulmonary edema (HCC)   . CHF (congestive heart failure) ([place])   . Hypertensive emergency 06/26/2016  . Hypertensive urgency 06/26/2016  . Chronic diastolic heart failure ([place]) 02/06/2012  . HTN (hypertension) 02/06/2012  . Anemia 12/27/2011  . Pulmonary hypertension ([place]) 12/15/2011  . Cellulitis of left leg 12/15/2011  . Anasarca 12/10/2011  . Accelerated hypertension 12/10/2011  . Elevated serum creatinine 12/10/2011  . Hyperbilirubinemia 12/10/2011  . Gout attack 12/10/2011  . Atrial fibrillation with controlled ventricular response ([place]) 12/10/2011  . Morbid obesity ([place]) 12/10/2011  . Acquired trigger finger 12/12/2010  . LATERAL EPICONDYLITIS 06/06/2009  . BURSITIS, OLECRANON 12/10/2007    Past Surgical History:  Procedure Laterality Date  . CARDIOVERSION  01/15/2012   Procedure: CARDIOVERSION;  Surgeon: Jolaine Artist, MD;  Location: [facility] ENDOSCOPY;  Service: Cardiovascular;  Laterality: N/A;  . RIGHT HEART CATH N/A 02/13/2017   Procedure: RIGHT HEART CATH;  Surgeon: Jolaine Artist, MD;  Location: [place] CV LAB;  Service: Cardiovascular;  Laterality: N/A;  . RIGHT HEART CATHETERIZATION N/A 12/26/2011   Procedure: RIGHT HEART CATH;  Surgeon: Jolaine Artist, MD;  Location: [facility] CATH LAB;  Service: Cardiovascular;  Laterality: N/A;  . SKIN BIOPSY  12/18/2011   Procedure: BIOPSY SKIN;  Surgeon: Donato Heinz,  MD;  Location: [facility] ORS;  Service: General;  Laterality: N/A;  in the minor room.  . TEE WITHOUT CARDIOVERSION  01/15/2012   Procedure: TRANSESOPHAGEAL ECHOCARDIOGRAM (TEE);  Surgeon: Jolaine Artist, MD;  Location: [facility] ENDOSCOPY;  Service: Cardiovascular;  Laterality: N/A;  cardioversion/on xarelto     OB History    Gravida      Para      Term      Preterm      AB      Living  0     SAB      TAB      Ectopic      Multiple      Live Births               Home Medications    Prior to Admission medications   Medication Sig Start Date End Date Taking? Authorizing Provider  amLODipine (NORVASC) 10 MG tablet TAKE 1 TABLET BY MOUTH DAILY. 09/23/17   Bensimhon, Shaune Pascal, MD  apixaban (ELIQUIS) 5 MG TABS tablet Take 1 tablet (5 mg total) by mouth 2 (two) times daily. 11/23/16   Arbutus Leas, NP  cloNIDine (CATAPRES) 0.3 MG tablet TAKE 1 TABLET BY MOUTH 2 TIMES DAILY. MAY TAKE AN ADDITIONAL TABLET AS NEEDED FOR SBP>180 09/23/17   Bensimhon, Shaune Pascal, MD  hydrALAZINE (APRESOLINE) 100 MG tablet Take 1 tablet (100 mg total) by mouth 3 (three) times daily. 05/16/17   Shirley Friar, PA-C  levofloxacin (LEVAQUIN) 500 MG tablet Take 1 tablet (500 mg total) by mouth every other day. 10/28/17   Kathie Dike, MD  oxyCODONE-acetaminophen (PERCOCET/ROXICET) 5-325 MG tablet Take 1 tablet by mouth every 6 (six) hours as needed for severe pain. Patient not taking: Reported on 11/07/2017 10/26/17   Kathie Dike, MD  potassium chloride SA (K-DUR,KLOR-CON) 20 MEQ tablet TAKE 1 TABLET BY MOUTH 2 TIMES DAILY. 09/23/17   Bensimhon, Shaune Pascal, MD  sodium bicarbonate 650 MG tablet Take 1 tablet (650 mg total) by mouth 2 (two) times daily. 10/26/17   Kathie Dike, MD  sodium hypochlorite (DAKIN'S 1/4 STRENGTH) 0.125 % SOLN Irrigate with as directed 2 (two) times daily. 10/26/17   Kathie Dike, MD  spironolactone (ALDACTONE) 25 MG tablet Take 0.5 tablets (12.5 mg total) by mouth daily.  05/13/17   Bensimhon, Shaune Pascal, MD  torsemide (DEMADEX) 20 MG tablet Take 2 tablets (40 mg total) by mouth 2 (two) times daily. Take extra 20 mg tablet once daily as needed for 3 lb weight gain. 07/31/17   Clegg, Amy D, NP  traMADol (ULTRAM) 50 MG tablet Take 1 tablet (50 mg total) by mouth every 8 (eight) hours as needed. 11/07/17   Soyla Dryer, PA-C    Family History Family History  Problem Relation Age of Onset  . Hypertension Mother   . Diabetes Mother   . Heart attack Mother   . Heart attack Father   . Hypertension Sister   . Asthma Sister   . Hypertension Maternal Aunt   .  Hypertension Maternal Uncle   . Hypertension Sister   . Cancer Sister        thyroid and Kidney cancer    Social History Social History   Tobacco Use  . Smoking status: Never Smoker  . Smokeless tobacco: Never Used  Substance Use Topics  . Alcohol use: No  . Drug use: No     Allergies   Penicillins   Review of Systems Review of Systems  Constitutional: Negative for fever.  HENT: Negative for sore throat.   Eyes: Negative for visual disturbance.  Respiratory: Negative for shortness of breath.   Cardiovascular: Negative for chest pain.  Gastrointestinal: Positive for abdominal pain. Negative for diarrhea, melena, nausea and vomiting.  Genitourinary: Negative for dysuria, frequency and hematuria.  Musculoskeletal: Negative for neck pain.  Skin: Positive for wound. Negative for rash.  Neurological: Negative for headaches.     Physical Exam Updated Vital Signs Ht 5\' 10"  (1.778 m)   Wt 122 kg   LMP 11/10/2017   BMI 38.60 kg/m   Physical Exam  Constitutional: She is oriented to person, place, and time. She appears well-developed and well-nourished. No distress.  HENT:  Head: Normocephalic and atraumatic.  Eyes: Conjunctivae are normal.  Neck: Neck supple.  Cardiovascular: Normal rate. An irregularly irregular rhythm present.  No murmur heard. Pulmonary/Chest: Effort normal and  breath sounds normal. No respiratory distress.  Abdominal: Soft. There is tenderness in the right upper quadrant and epigastric area. There is no rigidity and no guarding.  Musculoskeletal: She exhibits edema.  She is got a large ulceration of her medial left lower leg across the ankle with foul smelling seepage.  Neurological: She is alert and oriented to person, place, and time. GCS eye subscore is 4. GCS verbal subscore is 5. GCS motor subscore is 6.  Skin: Skin is warm and dry. Capillary refill takes less than 2 seconds.  Psychiatric: She has a normal mood and affect.  Nursing note and vitals reviewed.    ED Treatments / Results  Labs (all labs ordered are listed, but only abnormal results are displayed) Labs Reviewed  COMPREHENSIVE METABOLIC PANEL - Abnormal; Notable for the following components:      Result Value   Potassium 3.2 (*)    Chloride 113 (*)    CO2 15 (*)    Glucose, Bld 122 (*)    BUN 41 (*)    Creatinine, Ser 2.66 (*)    Calcium 8.2 (*)    Total Protein 8.4 (*)    Albumin 2.8 (*)    AST 10 (*)    GFR calc non Af Amer 21 (*)    GFR calc Af Amer 24 (*)    All other components within normal limits  CBC - Abnormal; Notable for the following components:   WBC 11.9 (*)    RBC 2.85 (*)    Hemoglobin 8.4 (*)    HCT 27.3 (*)    All other components within normal limits  URINALYSIS, ROUTINE W REFLEX MICROSCOPIC - Abnormal; Notable for the following components:   Protein, ur 30 (*)    Bacteria, UA RARE (*)    All other components within normal limits  TROPONIN I - Abnormal; Notable for the following components:   Troponin I 0.05 (*)    All other components within normal limits  TROPONIN I - Abnormal; Notable for the following components:   Troponin I 0.05 (*)    All other components within normal limits  LIPASE, BLOOD  I-STAT BETA HCG BLOOD, ED ([facility], [facility], [facility] ONLY)    EKG EKG Interpretation  Date/Time:  Saturday November 30 2017 09:13:35 EDT Ventricular Rate:   99 PR Interval:    QRS Duration: 125 QT Interval:  402 QTC Calculation: 614 R Axis:   23 Text Interpretation:  Atrial fibrillation Nonspecific intraventricular conduction delay Abnormal T, consider ischemia, lateral leads similar to prior 12/18 Confirmed by Aletta Edouard [phone]) on 12/23/2017 9:21:28 AM   Radiology US Abdomen Limited Ruq  Result Date: 12/05/2017 CLINICAL DATA:  Right upper quadrant abdominal pain. Chronic kidney disease and hypertension. EXAM: ULTRASOUND ABDOMEN LIMITED RIGHT UPPER QUADRANT COMPARISON:  Renal ultrasound dated 06/30/2016 and abdomen and pelvis CT dated 01/02/2012 FINDINGS: Gallbladder: No gallstones or wall thickening visualized. No sonographic Murphy sign noted by sonographer. Common bile duct: Diameter: 4.1 mm Liver: Mildly irregular borders. Normal echotexture. Portal vein is patent on color Doppler imaging with normal direction of blood flow towards the liver. Also noted is a markedly echogenic right kidney with progressive increased echogenicity, containing a small cyst. IMPRESSION: 1. No acute abnormality. 2. Markedly echogenic right kidney with progression, compatible with medical renal disease. Electronically Signed   By: Claudie Revering M.D.   On: 12/05/2017 10:27    Procedures Procedures (including critical care time)  Medications Ordered in ED Medications  morphine 4 MG/ML injection 4 mg (has no administration in time range)  sodium chloride 0.9 % bolus 1,000 mL (has no administration in time range)     Initial Impression / Assessment and Plan / ED Course  I have reviewed the triage vital signs and the nursing notes.  Pertinent labs & imaging results that were available during my care of the patient were reviewed by me and considered in my medical decision making (see chart for details).  Clinical Course as of Nov 30 1544  Sat Nov 30, 2017  0953 Patient's pain not significantly changed after the morphine.   [MB]  1055 Patient's troponin is  elevated 0.05 but on review of prior troponins that are usually higher than that.   [MB]  1056 Hemoglobin is 8.4 and stable compared to her priors.  Creatinine also abnormal at 2.66 but within the range of her priors.  Bicarb seems to be consistently low.  LFTs are unremarkable.   [MB]  1350 Patient refused CT because she does not feel like she can lay down flat.  She is agreeable to try dose diuretic and see if she can get a CAT scan done.   [MB]  0086 Patient was signed out to Dr. Ashok Cordia with CAT scan of the abdomen and pelvis pending.  Disposition per results of CAT scan.   [MB]    Clinical Course User Index [MB] Hayden Rasmussen, MD     Final Clinical Impressions(s) / ED Diagnoses   Final diagnoses:  RUQ abdominal pain    ED Discharge Orders    None       Hayden Rasmussen, MD 11/28/2017 1547

## 2017-11-30 NOTE — ED Notes (Signed)
Call from lab  Critical trop 0.05  Jenny Reichmann RN informed

## 2017-11-30 NOTE — ED Notes (Signed)
Wound care given to patient.  Lower legs cleaned with normal saline, gauze, kerlix applied.  Patient tolerated well.

## 2017-11-30 NOTE — ED Notes (Signed)
Date and time results received: 12/20/2017 10:55 AM  (use smartphrase ".now" to insert current time)  Test: Trop Critical Value: 0.05  Name of Provider Notified: Melina Copa  Orders Received? Or Actions Taken?: Orders Received - See Orders for details

## 2017-11-30 NOTE — ED Notes (Signed)
Patient transported to CT 

## 2017-11-30 NOTE — Consult Note (Signed)
Reason for Consult: Pneumoperitoneum Referring Physician: Dr. Legrand Rams is an 47 y.o. female.  HPI: Patient is a 47 year old black female with multiple medical problems, discharged from [facility] in August of this year who presented to the hospital with severe acute onset of upper abdominal pain.  She states that it was sudden in nature.  She has not had this pain before.  She denies any nausea or vomiting.  She actually is hungry.  When I went to see her in the emergency room, she said her abdominal pain had resolved and she wanted to eat chicken.  She denies any fever or chills.  She denies taking nonsteroidal anti-inflammatory drugs.  She is on Eliquis for atrial fibrillation.  Past Medical History:  Diagnosis Date  . Anemia   . Atrial flutter ([place])   . Cellulitis   . CHF (congestive heart failure) ([place])   . Chronic diastolic heart failure ([place])    a. Amyloidosis work-up negative 2013  b. 12/07/2014 ECHO EF 55-60%. LA severely dilated. RV normal RA moderately dilated, Severe LVH.IVC dilated.   . CKD (chronic kidney disease) stage 3, GFR 30-59 ml/min (HCC)   . Essential hypertension, benign   . Gout   . Gout   . History of cardiac catheterization    a. ectatic coronaries without obstruction 2006 - [place]  . Hypertensive heart disease   . Morbid obesity ([place])   . Persistent atrial fibrillation   . Pulmonary hypertension ([place])     Past Surgical History:  Procedure Laterality Date  . CARDIOVERSION  01/15/2012   Procedure: CARDIOVERSION;  Surgeon: Jolaine Artist, MD;  Location: [facility];  Service: Cardiovascular;  Laterality: N/A;  . RIGHT HEART CATH N/A 02/13/2017   Procedure: RIGHT HEART CATH;  Surgeon: Jolaine Artist, MD;  Location: [facility] CV LAB;  Service: Cardiovascular;  Laterality: N/A;  . RIGHT HEART CATHETERIZATION N/A 12/26/2011   Procedure: RIGHT HEART CATH;  Surgeon: Jolaine Artist, MD;  Location: [facility] CATH LAB;  Service:  Cardiovascular;  Laterality: N/A;  . SKIN BIOPSY  12/18/2011   Procedure: BIOPSY SKIN;  Surgeon: Donato Heinz, MD;  Location: [facility];  Service: General;  Laterality: N/A;  in the minor room.  . TEE WITHOUT CARDIOVERSION  01/15/2012   Procedure: TRANSESOPHAGEAL ECHOCARDIOGRAM (TEE);  Surgeon: Jolaine Artist, MD;  Location: [facility] ENDOSCOPY;  Service: Cardiovascular;  Laterality: N/A;  cardioversion/on xarelto    Family History  Problem Relation Age of Onset  . Hypertension Mother   . Diabetes Mother   . Heart attack Mother   . Heart attack Father   . Hypertension Sister   . Asthma Sister   . Hypertension Maternal Aunt   . Hypertension Maternal Uncle   . Hypertension Sister   . Cancer Sister        thyroid and Kidney cancer    Social History:  reports that she has never smoked. She has never used smokeless tobacco. She reports that she does not drink alcohol or use drugs.  Allergies:  Allergies  Allergen Reactions  . Penicillins Hives and Other (See Comments)    Childhood allergy. Has patient had a PCN reaction causing immediate rash, facial/tongue/throat swelling, SOB or lightheadedness with hypotension: NO Has patient had a PCN reaction causing severe rash involving mucus membranes or skin necrosis: No NO Has patient had a PCN reaction that required hospitalization No NO Has patient had a PCN reaction occurring within the last 10 years:  NO If all of the above answers are "NO", then may pro    Medications: I have reviewed the patient's current medications.  Results for orders placed or performed during the hospital encounter of 12/16/2017 (from the past 48 hour(s))  Lipase, blood     Status: None   Collection Time: 11/26/2017  9:25 AM  Result Value Ref Range   Lipase 49 11 - 51 U/L    Comment: Performed at [facility], [address] [place], [place] [address]  Comprehensive metabolic panel     Status: Abnormal   Collection Time: 12/04/2017  9:25 AM  Result Value Ref Range    Sodium 141 135 - 145 mmol/L   Potassium 3.2 (L) 3.5 - 5.1 mmol/L   Chloride 113 (H) 98 - 111 mmol/L   CO2 15 (L) 22 - 32 mmol/L   Glucose, Bld 122 (H) 70 - 99 mg/dL   BUN 41 (H) 6 - 20 mg/dL   Creatinine, Ser 2.66 (H) 0.44 - 1.00 mg/dL   Calcium 8.2 (L) 8.9 - 10.3 mg/dL   Total Protein 8.4 (H) 6.5 - 8.1 g/dL   Albumin 2.8 (L) 3.5 - 5.0 g/dL   AST 10 (L) 15 - 41 U/L   ALT <5 0 - 44 U/L   Alkaline Phosphatase 65 38 - 126 U/L   Total Bilirubin 1.1 0.3 - 1.2 mg/dL   GFR calc non Af Amer 21 (L) >60 mL/min   GFR calc Af Amer 24 (L) >60 mL/min    Comment: (NOTE) The eGFR has been calculated using the CKD EPI equation. This calculation has not been validated in all clinical situations. eGFR's persistently <60 mL/min signify possible Chronic Kidney Disease.    Anion gap 13 5 - 15    Comment: Performed at [facility], [address], [place], [place] [address]  CBC     Status: Abnormal   Collection Time: 12/02/2017  9:25 AM  Result Value Ref Range   WBC 11.9 (H) 4.0 - 10.5 K/uL   RBC 2.85 (L) 3.87 - 5.11 MIL/uL   Hemoglobin 8.4 (L) 12.0 - 15.0 g/dL   HCT 27.3 (L) 36.0 - 46.0 %   MCV 95.8 78.0 - 100.0 fL   MCH 29.5 26.0 - 34.0 pg   MCHC 30.8 30.0 - 36.0 g/dL   RDW 15.3 11.5 - 15.5 %   Platelets 346 150 - 400 K/uL    Comment: Performed at [facility], [address] [place], [place] [address]  Troponin I     Status: Abnormal   Collection Time: 12/01/2017  9:25 AM  Result Value Ref Range   Troponin I 0.05 (HH) <0.03 ng/mL    Comment: CRITICAL RESULT CALLED TO, READ BACK BY AND VERIFIED WITH: CREWS,M AT 1054 ON 12/05/2017 BY MOSLEY,J Performed at [facility], [address], [place], [place] [address]   I-Stat beta hCG blood, ED     Status: None   Collection Time: 12/07/2017  9:34 AM  Result Value Ref Range   I-stat hCG, quantitative <5.0 <5 mIU/mL   Comment 3            Comment:   GEST. AGE      CONC.  (mIU/mL)   <=1 WEEK        5 - 50     2 WEEKS       50 - 500     3 WEEKS        100 - 10,000  4 WEEKS     1,000 - 30,000        FEMALE AND NON-PREGNANT FEMALE:     LESS THAN 5 mIU/mL   Urinalysis, Routine w reflex microscopic     Status: Abnormal   Collection Time: 12/18/2017 11:20 AM  Result Value Ref Range   Color, Urine YELLOW YELLOW   APPearance CLEAR CLEAR   Specific Gravity, Urine 1.014 1.005 - 1.030   pH 5.0 5.0 - 8.0   Glucose, UA NEGATIVE NEGATIVE mg/dL   Hgb urine dipstick NEGATIVE NEGATIVE   Bilirubin Urine NEGATIVE NEGATIVE   Ketones, ur NEGATIVE NEGATIVE mg/dL   Protein, ur 30 (A) NEGATIVE mg/dL   Nitrite NEGATIVE NEGATIVE   Leukocytes, UA NEGATIVE NEGATIVE   RBC / HPF 0-5 0 - 5 RBC/hpf   WBC, UA 0-5 0 - 5 WBC/hpf   Bacteria, UA RARE (A) NONE SEEN   Squamous Epithelial / LPF 0-5 0 - 5    Comment: Performed at [facility], [address] [place], [place] [address]  Troponin I     Status: Abnormal   Collection Time: 11/29/2017  2:42 PM  Result Value Ref Range   Troponin I 0.05 (HH) <0.03 ng/mL    Comment: CRITICAL RESULT CALLED TO, READ BACK BY AND VERIFIED WITH: TUTTLE,A. AT 12/25/2017 BY EVA Performed at [facility], [address] [place], [place] [address]   Brain natriuretic peptide     Status: Abnormal   Collection Time: 12/21/2017  2:42 PM  Result Value Ref Range   B Natriuretic Peptide 328.0 (H) 0.0 - 100.0 pg/mL    Comment: Performed at [facility], [address], [place], [place] [address]    Ct Abdomen Pelvis Wo Contrast  Result Date: 12/17/2017 CLINICAL DATA:  Acute abdominal pain. EXAM: CT ABDOMEN AND PELVIS WITHOUT CONTRAST TECHNIQUE: Multidetector CT imaging of the abdomen and pelvis was performed following the standard protocol without IV contrast. COMPARISON:  CT scan January 02, 2012 FINDINGS: Lower chest: Small calcified granulomas in the lung bases. No other acute abnormalities in the lung bases. Cardiomegaly. Small pericardial effusion. Coronary artery calcifications. Hepatobiliary: The liver demonstrates a nodular contour  suggesting cirrhosis. No liver masses noted. The gallbladder is mildly distended but otherwise normal. Right upper quadrant ultrasound from today demonstrated no gallbladder abnormalities. Pancreas: Unremarkable. No pancreatic ductal dilatation or surrounding inflammatory changes. Spleen: Normal in size without focal abnormality. Adrenals/Urinary Tract: The adrenal glands are normal. There appear to be 2 right renal cysts. I suspect a small cyst in the left kidney as well. An exophytic mass off the left kidney on series 2, image 38 demonstrates an attenuation of 33 Hounsfield units and this mass measures 10 mm. No hydronephrosis. A few small stones are seen in the right kidney. No ureterectasis or ureteral stone. The bladder is unremarkable. Stomach/Bowel: The stomach and small bowel is normal with no obstruction. The colon is unremarkable. Visualized portions of the appendix are normal. Vascular/Lymphatic: Atherosclerotic changes are seen in the nonaneurysmal aorta. Shotty nodes are seen in the retroperitoneum and gastrohepatic ligament. No gross adenopathy. Reproductive: The left ovary is somewhat enlarged measuring 6.5 cm. A rounded region of decreased echogenicity within the ovary demonstrates an attenuation of 40 Hounsfield units. The right ovary is normal in appearance. The uterus is unremarkable. Other: Free air seen in the abdomen with no identified cause. There is ascites as well, particularly adjacent to the liver and spleen, in the pericolic gutters, and in the pelvis. Musculoskeletal: No acute or  significant osseous findings. IMPRESSION: 1. Free air in the abdomen suggests perforated viscus. An underlying cause is not seen. 2. Moderate ascites in the abdomen and pelvis. 3. Enlarged left ovary containing a 5 cm rounded hypoechoic region. The hypoechoic region could represent a hemorrhagic cyst but is nonspecific. Recommend pelvic ultrasound for further evaluation of the left ovary. 4. Cirrhotic liver. 5.  10 mm exophytic mass off the left kidney with an attenuation of 33 Hounsfield units. This could represent a solid mass or hyperdense cyst. An ultrasound may further characterize when the patient is able. 6. Nonobstructive renal stones. 7. Atherosclerotic changes in the nonaneurysmal aorta. Findings called to Dr. Ashok Cordia. Electronically Signed   By: Dorise Bullion III M.D   On: 12/01/2017 20:50   Dg Chest 2 View  Result Date: 12/09/2017 CLINICAL DATA:  Shortness of breath, RIGHT upper abdominal pain, RIGHT shoulder pain, awoke patient from sleep, history CHF, essential benign hypertension, atrial fibrillation, pulmonary hypertension EXAM: CHEST - 2 VIEW COMPARISON:  04/04/2017 FINDINGS: Enlargement of cardiac silhouette. Mediastinal contours and pulmonary vascularity normal. Mild RIGHT basilar atelectasis. Chronic accentuation of pulmonary markings, stable. No acute infiltrate, pleural effusion or pneumothorax. Bones unremarkable. IMPRESSION: Enlargement of cardiac silhouette with RIGHT basilar atelectasis. Electronically Signed   By: Lavonia Dana M.D.   On: 12/14/2017 17:31   US Abdomen Limited Ruq  Result Date: 12/16/2017 CLINICAL DATA:  Right upper quadrant abdominal pain. Chronic kidney disease and hypertension. EXAM: ULTRASOUND ABDOMEN LIMITED RIGHT UPPER QUADRANT COMPARISON:  Renal ultrasound dated 06/30/2016 and abdomen and pelvis CT dated 01/02/2012 FINDINGS: Gallbladder: No gallstones or wall thickening visualized. No sonographic Murphy sign noted by sonographer. Common bile duct: Diameter: 4.1 mm Liver: Mildly irregular borders. Normal echotexture. Portal vein is patent on color Doppler imaging with normal direction of blood flow towards the liver. Also noted is a markedly echogenic right kidney with progressive increased echogenicity, containing a small cyst. IMPRESSION: 1. No acute abnormality. 2. Markedly echogenic right kidney with progression, compatible with medical renal disease.  Electronically Signed   By: Claudie Revering M.D.   On: 12/24/2017 10:27    ROS:  Pertinent items are noted in HPI.  Blood pressure 126/73, pulse 94, temperature (!) 97.4 F (36.3 C), temperature source Axillary, resp. rate (!) 22, height 5' 10"  (1.778 m), weight 122 kg, last menstrual period 11/10/2017, SpO2 100 %. Physical Exam: Black female who is sitting up in the bed, in no acute distress.  Audible rhonchi noted. Head is normocephalic, atraumatic Lungs with bilateral coarse rhonchi present. Heart examination reveals an irregularly irregular rate and rhythm. The abdomen is remarkably soft.  She has no tenderness to palpation.  No rigidity is noted.  Due to body habitus, I cannot assess the liver edge.  CT scan images personally reviewed  Assessment/Plan: Impression: Pneumoperitoneum of unknown etiology.  Given her symptomatology, perforated peptic ulcer is most likely.  Surprisingly, the patient does not exhibit symptoms of peritonitis.  She is a poor operative candidate given her cardiopulmonary issues.  Likewise, she did take Eliquis today and that precludes any acute surgical intervention.  She does have an elevated BNP as well as troponin.  This may be stress related. Plan: There are instances when the peptic ulcer does heal over without the need for surgical intervention.  I would recommend NG tube placement and IV antibiotic coverage.  One needs to be judicious with fluid resuscitation given her cardiac history.  Will monitor closely with you.  Aviva Signs 12/14/2017, 10:03 PM

## 2017-11-30 NOTE — ED Notes (Signed)
Critical result of Trop. 0.05 given to Dr. Melina Copa.

## 2017-12-01 ENCOUNTER — Inpatient Hospital Stay (HOSPITAL_COMMUNITY): Payer: Medicaid Other

## 2017-12-01 ENCOUNTER — Encounter (HOSPITAL_COMMUNITY): Payer: Self-pay | Admitting: Radiology

## 2017-12-01 LAB — CBC WITH DIFFERENTIAL/PLATELET
BASOS PCT: 0 %
Basophils Absolute: 0 10*3/uL (ref 0.0–0.1)
EOS PCT: 1 %
Eosinophils Absolute: 0.1 10*3/uL (ref 0.0–0.7)
HCT: 33.7 % — ABNORMAL LOW (ref 36.0–46.0)
Hemoglobin: 10.3 g/dL — ABNORMAL LOW (ref 12.0–15.0)
LYMPHS ABS: 0.3 10*3/uL — AB (ref 0.7–4.0)
Lymphocytes Relative: 2 %
MCH: 29.4 pg (ref 26.0–34.0)
MCHC: 30.6 g/dL (ref 30.0–36.0)
MCV: 96.3 fL (ref 78.0–100.0)
MONO ABS: 0.6 10*3/uL (ref 0.1–1.0)
Monocytes Relative: 4 %
Neutro Abs: 13.6 10*3/uL — ABNORMAL HIGH (ref 1.7–7.7)
Neutrophils Relative %: 93 %
PLATELETS: 323 10*3/uL (ref 150–400)
RBC: 3.5 MIL/uL — ABNORMAL LOW (ref 3.87–5.11)
RDW: 15.6 % — AB (ref 11.5–15.5)
WBC: 14.6 10*3/uL — AB (ref 4.0–10.5)

## 2017-12-01 LAB — COMPREHENSIVE METABOLIC PANEL
ALK PHOS: 43 U/L (ref 38–126)
ALT: 6 U/L (ref 0–44)
AST: 12 U/L — AB (ref 15–41)
Albumin: 2.4 g/dL — ABNORMAL LOW (ref 3.5–5.0)
Anion gap: 12 (ref 5–15)
BUN: 44 mg/dL — AB (ref 6–20)
CALCIUM: 8 mg/dL — AB (ref 8.9–10.3)
CHLORIDE: 115 mmol/L — AB (ref 98–111)
CO2: 11 mmol/L — AB (ref 22–32)
CREATININE: 3.05 mg/dL — AB (ref 0.44–1.00)
GFR calc Af Amer: 20 mL/min — ABNORMAL LOW (ref >60)
GFR, EST NON AFRICAN AMERICAN: 17 mL/min — AB (ref >60)
Glucose, Bld: 71 mg/dL (ref 70–99)
Potassium: 4.4 mmol/L (ref 3.5–5.1)
Sodium: 138 mmol/L (ref 135–145)
Total Bilirubin: 1.2 mg/dL (ref 0.3–1.2)
Total Protein: 7.4 g/dL (ref 6.5–8.1)

## 2017-12-01 LAB — LACTIC ACID, PLASMA
Lactic Acid, Venous: 2.4 mmol/L (ref 0.5–1.9)
Lactic Acid, Venous: 2.5 mmol/L (ref 0.5–1.9)
Lactic Acid, Venous: 2 mmol/L (ref 0.5–1.9)

## 2017-12-01 LAB — MRSA PCR SCREENING: MRSA by PCR: NEGATIVE

## 2017-12-01 LAB — MAGNESIUM: Magnesium: 1.8 mg/dL (ref 1.7–2.4)

## 2017-12-01 LAB — TYPE AND SCREEN
ABO/RH(D): O POS
ANTIBODY SCREEN: NEGATIVE

## 2017-12-01 LAB — TROPONIN I
TROPONIN I: 0.05 ng/mL — AB (ref <0.03)
Troponin I: 0.06 ng/mL (ref <0.03)

## 2017-12-01 MED ORDER — CHLORHEXIDINE GLUCONATE CLOTH 2 % EX PADS
6.0000 | MEDICATED_PAD | Freq: Every day | CUTANEOUS | Status: DC
  Administered 2017-12-01 – 2017-12-04 (×4): 6 via TOPICAL

## 2017-12-01 MED ORDER — SODIUM CHLORIDE 0.9 % IV SOLN
INTRAVENOUS | Status: DC | PRN
  Administered 2017-12-01: 250 mL via INTRAVENOUS
  Administered 2017-12-05: 500 mL via INTRAVENOUS

## 2017-12-01 MED ORDER — SODIUM CHLORIDE 0.9% FLUSH
10.0000 mL | Freq: Two times a day (BID) | INTRAVENOUS | Status: DC
  Administered 2017-12-01 – 2017-12-06 (×9): 10 mL
  Administered 2017-12-06: 20 mL
  Administered 2017-12-07: 10 mL

## 2017-12-01 MED ORDER — INFLUENZA VAC SPLIT QUAD 0.5 ML IM SUSY
0.5000 mL | PREFILLED_SYRINGE | INTRAMUSCULAR | Status: AC
  Administered 2017-12-02: 0.5 mL via INTRAMUSCULAR
  Filled 2017-12-01: qty 0.5

## 2017-12-01 MED ORDER — LORAZEPAM 2 MG/ML IJ SOLN
1.0000 mg | Freq: Four times a day (QID) | INTRAMUSCULAR | Status: DC | PRN
  Administered 2017-12-01 – 2017-12-03 (×6): 1 mg via INTRAVENOUS
  Filled 2017-12-01 (×6): qty 1

## 2017-12-01 MED ORDER — SODIUM CHLORIDE 0.9% FLUSH: 10.0000 mL | INTRAVENOUS | Status: DC | PRN

## 2017-12-01 NOTE — Progress Notes (Signed)
IV ativan already given per orders, patient restless. Redirection given. Patient has generalized weakness, unable to transfer safely.  Reposition multiple times.

## 2017-12-01 NOTE — Progress Notes (Signed)
PROGRESS NOTE    [name]  ERD:[id number] DOB: 04/17/1970 DOA: 11/29/2017 PCP: Soyla Dryer, PA-C   Brief Narrative:  HPI On 12/16/2017 by Dr. Mitzi Hansen [name] is a 47 y.o. female with medical history significant for atrial fibrillation on Eliquis, chronic diastolic CHF, hypertension, CKD IV, and chronic anemia, now presenting to the emergency department for evaluation of acute upper abdominal pain.  Patient reports that she developed acute onset of pain in the right upper quadrant this morning with radiation towards the right shoulder.  Pain is rated as severe, constant, with no alleviating or exacerbating factors identified, and without vomiting, diarrhea, fevers, or chills.  She denies any chest pain or palpitations and denies any change in her chronic dyspnea.  She has never experienced these symptoms previously.  Denies melena or hematochezia.  Interim history Patient admitted for pneumoperitoneum. General surgery consulted, no surgery at this time. Plan treat medically.  Assessment & Plan   Abdominal pain secondary to Pneumoperitoneum -Patient presented with RUQ abdomina pain -CT abd/pelvis: free air in the abdomen suggests perforated viscus. Underlying cause not seen.  -Currently NPO, gentle IVF given history of CHF -NG tube was placed, however patient pulled this out -General surgery consulted and appreciated, recommended treat medically with antibiotics as she is a high risk surgical candidate given her comorbidities  -Discussed with Dr. Arnoldo Morale, surgery, will obtain upper GI series on 12/02/2017. Patient can have ice chips. -Currently on cefepime, flagyl  Atrial fibrillation  -CHADSVASC 3 (gender, CHF,HTN) -Heart rate in the ED was up to 130s. Patient was placed on diltiazem drip.  -Will continue drip for now until further recommendations from surgery -Eliquis currenlty held   Chronic diastolic heart failure -BNP 328 (reviewed chart, this BNP is much  lower than previous) -Suspect BNP elevated given CKD stage IV -Does not appear to be volume overloaded at this time -Diuretics currently held -was placed on gentle IVF given acute illness and NPO status -continue to follow intake/output, daily weights  Chronic kidney disease, Stage IV -creatinine currently 3.05, however within stage IV -Continue to monitor BMP  Anemia of chronic kidney disease -hemoglobin currently 10.3 -continue to monitor CBC  Essential hypertension -currently stable and cardizem drip  Chronic leg ulcers -continue wound care  Elevated troponin -No complaints of chest pain -Given that patient has CKD4, troponin not very reliable -trop flat 0.05-0.06-0.05  Hypokalemia -resolved with replacement  Left renal mass/ Left adnexal lesion -incidentally noted on CT abd/pelvis -Patient will need outpatient follow   DVT Prophylaxis  Was on Eliquis, currently held  Code Status: Full  Family Communication: Family at bedside  Disposition Plan: Admitted. Continue to monitor in ICU  Consultants General surgery  Procedures  None  Antibiotics   Anti-infectives (From admission, onward)   Start     Dose/Rate Route Frequency Ordered Stop   12/01/17 2100  ceFEPIme (MAXIPIME) 2 g in sodium chloride 0.9 % 100 mL IVPB     2 g 200 mL/hr over 30 Minutes Intravenous Every 24 hours 11/29/2017 2119     12/07/2017 2115  ceFEPIme (MAXIPIME) 2 g in sodium chloride 0.9 % 100 mL IVPB     2 g 200 mL/hr over 30 Minutes Intravenous  Once 12/01/2017 2100 12/07/2017 2240   12/01/2017 2115  metroNIDAZOLE (FLAGYL) IVPB 500 mg     500 mg 100 mL/hr over 60 Minutes Intravenous Every 8 hours 12/17/2017 2100        Subjective:   [name] seen and  examined today.  Has no complaints today. Denies chest pain, shortness of breath, abdominal pain, nausea, vomiting, diarrhea, constipation.  Objective:   Vitals:   12/19/2017 2343 12/01/17 0115 12/01/17 0130 12/01/17 0200  BP:  (!) 141/86 (!)  125/97 (!) 153/97  Pulse:      Resp:      Temp: 97.7 F (36.5 C)     TempSrc: Oral     SpO2:      Weight: 121 kg     Height: 5\' 11"  (1.803 m)       Intake/Output Summary (Last 24 hours) at 12/01/2017 1155 Last data filed at 12/01/2017 1000 Gross per 24 hour  Intake 467.01 ml  Output -  Net 467.01 ml   Filed Weights   12/23/2017 0905 12/24/2017 2343  Weight: 122 kg 121 kg    Exam  General: Well developed, chronically ill appearing, older than state age  70: NCAT, mucous membranes moist.   Neck: Supple  Cardiovascular: S1 S2 auscultated, irregular  Respiratory: Clear to auscultation bilaterally with equal chest rise  Abdomen: Soft, obese, nontender, nondistended, + bowel sounds  Extremities: warm dry without cyanosis clubbing. LE edema with dressing in place B/L  Neuro: AAOx3, nonfocal  Psych: Appropriate mood and affect   Data Reviewed: I have personally reviewed following labs and imaging studies  CBC: Recent Labs  Lab 12/09/2017 0925 12/01/17 0545  WBC 11.9* 14.6*  NEUTROABS  --  13.6*  HGB 8.4* 10.3*  HCT 27.3* 33.7*  MCV 95.8 96.3  PLT 346 361   Basic Metabolic Panel: Recent Labs  Lab 12/19/2017 0925 12/01/17 0545  NA 141 138  K 3.2* 4.4  CL 113* 115*  CO2 15* 11*  GLUCOSE 122* 71  BUN 41* 44*  CREATININE 2.66* 3.05*  CALCIUM 8.2* 8.0*  MG  --  1.8   GFR: Estimated Creatinine Clearance: 33.4 mL/min (A) (by C-G formula based on SCr of 3.05 mg/dL (H)). Liver Function Tests: Recent Labs  Lab 12/21/2017 0925 12/01/17 0545  AST 10* 12*  ALT <5 6  ALKPHOS 65 43  BILITOT 1.1 1.2  PROT 8.4* 7.4  ALBUMIN 2.8* 2.4*   Recent Labs  Lab 12/07/2017 0925  LIPASE 49   No results for input(s): AMMONIA in the last 168 hours. Coagulation Profile: Recent Labs  Lab 12/07/2017 2201  INR 1.48   Cardiac Enzymes: Recent Labs  Lab 12/05/2017 0925 11/29/2017 1442 12/01/17 0059 12/01/17 0545  TROPONINI 0.05* 0.05* 0.06* 0.05*   BNP (last 3  results) No results for input(s): PROBNP in the last 8760 hours. HbA1C: No results for input(s): HGBA1C in the last 72 hours. CBG: No results for input(s): GLUCAP in the last 168 hours. Lipid Profile: No results for input(s): CHOL, HDL, LDLCALC, TRIG, CHOLHDL, LDLDIRECT in the last 72 hours. Thyroid Function Tests: No results for input(s): TSH, T4TOTAL, FREET4, T3FREE, THYROIDAB in the last 72 hours. Anemia Panel: No results for input(s): VITAMINB12, FOLATE, FERRITIN, TIBC, IRON, RETICCTPCT in the last 72 hours. Urine analysis:    Component Value Date/Time   COLORURINE YELLOW 12/10/2017 1120   APPEARANCEUR CLEAR 12/24/2017 1120   LABSPEC 1.014 12/13/2017 1120   PHURINE 5.0 12/20/2017 1120   GLUCOSEU NEGATIVE 12/02/2017 1120   HGBUR NEGATIVE 12/19/2017 1120   BILIRUBINUR NEGATIVE 12/12/2017 1120   KETONESUR NEGATIVE 12/22/2017 1120   PROTEINUR 30 (A) 11/29/2017 1120   UROBILINOGEN 0.2 12/07/2014 0014   NITRITE NEGATIVE 12/07/2017 1120   LEUKOCYTESUR NEGATIVE 12/22/2017 1120   Sepsis Labs: @  LABRCNTIP(procalcitonin:4,lacticidven:4)  ) Recent Results (from the past 240 hour(s))  MRSA PCR Screening     Status: None   Collection Time: 12/07/2017 11:21 PM  Result Value Ref Range Status   MRSA by PCR NEGATIVE NEGATIVE Final    Comment:        The GeneXpert MRSA Assay (FDA approved for NASAL specimens only), is one component of a comprehensive MRSA colonization surveillance program. It is not intended to diagnose MRSA infection nor to guide or monitor treatment for MRSA infections. Performed at [facility], [address] [place], [place] [address]       Radiology Studies: Ct Abdomen Pelvis Wo Contrast  Result Date: 12/23/2017 CLINICAL DATA:  Acute abdominal pain. EXAM: CT ABDOMEN AND PELVIS WITHOUT CONTRAST TECHNIQUE: Multidetector CT imaging of the abdomen and pelvis was performed following the standard protocol without IV contrast. COMPARISON:  CT scan January 02, 2012  FINDINGS: Lower chest: Small calcified granulomas in the lung bases. No other acute abnormalities in the lung bases. Cardiomegaly. Small pericardial effusion. Coronary artery calcifications. Hepatobiliary: The liver demonstrates a nodular contour suggesting cirrhosis. No liver masses noted. The gallbladder is mildly distended but otherwise normal. Right upper quadrant ultrasound from today demonstrated no gallbladder abnormalities. Pancreas: Unremarkable. No pancreatic ductal dilatation or surrounding inflammatory changes. Spleen: Normal in size without focal abnormality. Adrenals/Urinary Tract: The adrenal glands are normal. There appear to be 2 right renal cysts. I suspect a small cyst in the left kidney as well. An exophytic mass off the left kidney on series 2, image 38 demonstrates an attenuation of 33 Hounsfield units and this mass measures 10 mm. No hydronephrosis. A few small stones are seen in the right kidney. No ureterectasis or ureteral stone. The bladder is unremarkable. Stomach/Bowel: The stomach and small bowel is normal with no obstruction. The colon is unremarkable. Visualized portions of the appendix are normal. Vascular/Lymphatic: Atherosclerotic changes are seen in the nonaneurysmal aorta. Shotty nodes are seen in the retroperitoneum and gastrohepatic ligament. No gross adenopathy. Reproductive: The left ovary is somewhat enlarged measuring 6.5 cm. A rounded region of decreased echogenicity within the ovary demonstrates an attenuation of 40 Hounsfield units. The right ovary is normal in appearance. The uterus is unremarkable. Other: Free air seen in the abdomen with no identified cause. There is ascites as well, particularly adjacent to the liver and spleen, in the pericolic gutters, and in the pelvis. Musculoskeletal: No acute or significant osseous findings. IMPRESSION: 1. Free air in the abdomen suggests perforated viscus. An underlying cause is not seen. 2. Moderate ascites in the abdomen and  pelvis. 3. Enlarged left ovary containing a 5 cm rounded hypoechoic region. The hypoechoic region could represent a hemorrhagic cyst but is nonspecific. Recommend pelvic ultrasound for further evaluation of the left ovary. 4. Cirrhotic liver. 5. 10 mm exophytic mass off the left kidney with an attenuation of 33 Hounsfield units. This could represent a solid mass or hyperdense cyst. An ultrasound may further characterize when the patient is able. 6. Nonobstructive renal stones. 7. Atherosclerotic changes in the nonaneurysmal aorta. Findings called to Dr. Ashok Cordia. Electronically Signed   By: Dorise Bullion III M.D   On: 12/07/2017 20:50   Dg Chest 1 View  Result Date: 12/05/2017 CLINICAL DATA:  Evaluate NG tube placement EXAM: CHEST  1 VIEW COMPARISON:  November 30, 2017 FINDINGS: The distal NG tube is not well seen due to poor penetration. However, it appears to terminate below today's study, within the abdomen. No other change.  IMPRESSION: The distal NG tube is not well seen due to poor penetration. However, it appears to terminate in the abdomen, below the bottom of today's study. Electronically Signed   By: Dorise Bullion III M.D   On: 12/05/2017 23:25   Dg Chest 2 View  Result Date: 11/29/2017 CLINICAL DATA:  Shortness of breath, RIGHT upper abdominal pain, RIGHT shoulder pain, awoke patient from sleep, history CHF, essential benign hypertension, atrial fibrillation, pulmonary hypertension EXAM: CHEST - 2 VIEW COMPARISON:  04/04/2017 FINDINGS: Enlargement of cardiac silhouette. Mediastinal contours and pulmonary vascularity normal. Mild RIGHT basilar atelectasis. Chronic accentuation of pulmonary markings, stable. No acute infiltrate, pleural effusion or pneumothorax. Bones unremarkable. IMPRESSION: Enlargement of cardiac silhouette with RIGHT basilar atelectasis. Electronically Signed   By: Lavonia Dana M.D.   On: 12/13/2017 17:31   US Abdomen Limited Ruq  Result Date: 12/04/2017 CLINICAL DATA:  Right  upper quadrant abdominal pain. Chronic kidney disease and hypertension. EXAM: ULTRASOUND ABDOMEN LIMITED RIGHT UPPER QUADRANT COMPARISON:  Renal ultrasound dated 06/30/2016 and abdomen and pelvis CT dated 01/02/2012 FINDINGS: Gallbladder: No gallstones or wall thickening visualized. No sonographic Murphy sign noted by sonographer. Common bile duct: Diameter: 4.1 mm Liver: Mildly irregular borders. Normal echotexture. Portal vein is patent on color Doppler imaging with normal direction of blood flow towards the liver. Also noted is a markedly echogenic right kidney with progressive increased echogenicity, containing a small cyst. IMPRESSION: 1. No acute abnormality. 2. Markedly echogenic right kidney with progression, compatible with medical renal disease. Electronically Signed   By: Claudie Revering M.D.   On: 12/07/2017 10:27     Scheduled Meds: . [START ON 12/02/2017] Influenza vac split quadrivalent PF  0.5 mL Intramuscular Tomorrow-1000  . pantoprazole  40 mg Intravenous Q12H  . sodium chloride flush  3 mL Intravenous Q12H   Continuous Infusions: . ceFEPime (MAXIPIME) IV    . diltiazem (CARDIZEM) infusion 7.5 mg/hr (12/01/17 0540)  . metronidazole 500 mg (12/01/17 0431)     LOS: 1 day   Time Spent in minutes   30 minutes  [name] D.O. on 12/01/2017 at 11:55 AM  Between 7am to 7pm - Please see pager noted on [email or web address]  After 7pm go to [email or web address]  And look for the night coverage person covering for me after hours  [facility] Office  [phone]

## 2017-12-01 NOTE — Progress Notes (Signed)
Patient keeps requesting ice chips. Informed patient of NPO status and rationale behind it.

## 2017-12-01 NOTE — Progress Notes (Signed)
Dr. Myna Hidalgo notified of critical lactic acid 2.5

## 2017-12-01 NOTE — Progress Notes (Signed)
Took report that lactic acid is 2.0 which is less than previous.

## 2017-12-01 NOTE — Progress Notes (Signed)
eLink Physician-Brief Progress Note Patient Name: [name] DOB: April 28, 1970 MRN: [id number]   Date of Service  12/01/2017  HPI/Events of Note  Notified of need for DVT prophylaxis - Eliquis on hold d/t ulcer.   eICU Interventions  Will order: 1. Place SCD's.     Intervention Category Intermediate Interventions: Best-practice therapies (e.g. DVT, beta blocker, etc.)  Sommer,Steven Eugene 12/01/2017, 10:24 PM

## 2017-12-01 NOTE — Progress Notes (Signed)
Patient refuses to keep NG tube in unless "knock me out". Notified MD order for IV Ativan. Patient refused Ativan, MD aware NG tube removed due to patient refusal. Patient was educated on possible complications due to NG removal, patient acknowledged those risks and to continue with removal.

## 2017-12-01 NOTE — Progress Notes (Signed)
Spoke with RN re PICC line.  States she has already called [facility] for placement.

## 2017-12-01 NOTE — Progress Notes (Signed)
Subjective: Patient states she has no abdominal pain.  She is hungry and requesting ice chips.  She has refused replacement of her NG tube.  Objective: Vital signs in last 24 hours: Temp:  [97.4 F (36.3 C)-97.7 F (36.5 C)] 97.7 F (36.5 C) (10/05 2343) Pulse Rate:  [41-115] 62 (10/05 2215) Resp:  [16-36] 25 (10/05 2215) BP: (111-197)/(73-115) 153/97 (10/06 0200) SpO2:  [72 %-100 %] 99 % (10/05 2215) Weight:  [628 kg] 121 kg (10/05 2343) Last BM Date: 11/29/17  Intake/Output from previous day: 10/05 0701 - 10/06 0700 In: 739.2 [I.V.:269; IV Piggyback:470.1] Out: -  Intake/Output this shift: Total I/O In: 3 [I.V.:3] Out: -   General appearance: alert and no distress GI: soft, non-tender; bowel sounds normal; no masses,  no organomegaly  Lab Results:  Recent Labs    12/25/2017 0925 12/01/17 0545  WBC 11.9* 14.6*  HGB 8.4* 10.3*  HCT 27.3* 33.7*  PLT 346 323   BMET Recent Labs    11/29/2017 0925 12/01/17 0545  NA 141 138  K 3.2* 4.4  CL 113* 115*  CO2 15* 11*  GLUCOSE 122* 71  BUN 41* 44*  CREATININE 2.66* 3.05*  CALCIUM 8.2* 8.0*   PT/INR Recent Labs    12/09/2017 2201  LABPROT 17.8*  INR 1.48    Studies/Results: Ct Abdomen Pelvis Wo Contrast  Result Date: 12/23/2017 CLINICAL DATA:  Acute abdominal pain. EXAM: CT ABDOMEN AND PELVIS WITHOUT CONTRAST TECHNIQUE: Multidetector CT imaging of the abdomen and pelvis was performed following the standard protocol without IV contrast. COMPARISON:  CT scan January 02, 2012 FINDINGS: Lower chest: Small calcified granulomas in the lung bases. No other acute abnormalities in the lung bases. Cardiomegaly. Small pericardial effusion. Coronary artery calcifications. Hepatobiliary: The liver demonstrates a nodular contour suggesting cirrhosis. No liver masses noted. The gallbladder is mildly distended but otherwise normal. Right upper quadrant ultrasound from today demonstrated no gallbladder abnormalities. Pancreas:  Unremarkable. No pancreatic ductal dilatation or surrounding inflammatory changes. Spleen: Normal in size without focal abnormality. Adrenals/Urinary Tract: The adrenal glands are normal. There appear to be 2 right renal cysts. I suspect a small cyst in the left kidney as well. An exophytic mass off the left kidney on series 2, image 38 demonstrates an attenuation of 33 Hounsfield units and this mass measures 10 mm. No hydronephrosis. A few small stones are seen in the right kidney. No ureterectasis or ureteral stone. The bladder is unremarkable. Stomach/Bowel: The stomach and small bowel is normal with no obstruction. The colon is unremarkable. Visualized portions of the appendix are normal. Vascular/Lymphatic: Atherosclerotic changes are seen in the nonaneurysmal aorta. Shotty nodes are seen in the retroperitoneum and gastrohepatic ligament. No gross adenopathy. Reproductive: The left ovary is somewhat enlarged measuring 6.5 cm. A rounded region of decreased echogenicity within the ovary demonstrates an attenuation of 40 Hounsfield units. The right ovary is normal in appearance. The uterus is unremarkable. Other: Free air seen in the abdomen with no identified cause. There is ascites as well, particularly adjacent to the liver and spleen, in the pericolic gutters, and in the pelvis. Musculoskeletal: No acute or significant osseous findings. IMPRESSION: 1. Free air in the abdomen suggests perforated viscus. An underlying cause is not seen. 2. Moderate ascites in the abdomen and pelvis. 3. Enlarged left ovary containing a 5 cm rounded hypoechoic region. The hypoechoic region could represent a hemorrhagic cyst but is nonspecific. Recommend pelvic ultrasound for further evaluation of the left ovary. 4. Cirrhotic liver. 5. 10  mm exophytic mass off the left kidney with an attenuation of 33 Hounsfield units. This could represent a solid mass or hyperdense cyst. An ultrasound may further characterize when the patient is  able. 6. Nonobstructive renal stones. 7. Atherosclerotic changes in the nonaneurysmal aorta. Findings called to Dr. Ashok Cordia. Electronically Signed   By: Dorise Bullion III M.D   On: 12/03/2017 20:50   Dg Chest 1 View  Result Date: 12/25/2017 CLINICAL DATA:  Evaluate NG tube placement EXAM: CHEST  1 VIEW COMPARISON:  November 30, 2017 FINDINGS: The distal NG tube is not well seen due to poor penetration. However, it appears to terminate below today's study, within the abdomen. No other change. IMPRESSION: The distal NG tube is not well seen due to poor penetration. However, it appears to terminate in the abdomen, below the bottom of today's study. Electronically Signed   By: Dorise Bullion III M.D   On: 11/29/2017 23:25   Dg Chest 2 View  Result Date: 12/04/2017 CLINICAL DATA:  Shortness of breath, RIGHT upper abdominal pain, RIGHT shoulder pain, awoke patient from sleep, history CHF, essential benign hypertension, atrial fibrillation, pulmonary hypertension EXAM: CHEST - 2 VIEW COMPARISON:  04/04/2017 FINDINGS: Enlargement of cardiac silhouette. Mediastinal contours and pulmonary vascularity normal. Mild RIGHT basilar atelectasis. Chronic accentuation of pulmonary markings, stable. No acute infiltrate, pleural effusion or pneumothorax. Bones unremarkable. IMPRESSION: Enlargement of cardiac silhouette with RIGHT basilar atelectasis. Electronically Signed   By: Lavonia Dana M.D.   On: 12/12/2017 17:31   US Abdomen Limited Ruq  Result Date: 12/15/2017 CLINICAL DATA:  Right upper quadrant abdominal pain. Chronic kidney disease and hypertension. EXAM: ULTRASOUND ABDOMEN LIMITED RIGHT UPPER QUADRANT COMPARISON:  Renal ultrasound dated 06/30/2016 and abdomen and pelvis CT dated 01/02/2012 FINDINGS: Gallbladder: No gallstones or wall thickening visualized. No sonographic Murphy sign noted by sonographer. Common bile duct: Diameter: 4.1 mm Liver: Mildly irregular borders. Normal echotexture. Portal vein is patent  on color Doppler imaging with normal direction of blood flow towards the liver. Also noted is a markedly echogenic right kidney with progressive increased echogenicity, containing a small cyst. IMPRESSION: 1. No acute abnormality. 2. Markedly echogenic right kidney with progression, compatible with medical renal disease. Electronically Signed   By: Claudie Revering M.D.   On: 12/16/2017 10:27    Anti-infectives: Anti-infectives (From admission, onward)   Start     Dose/Rate Route Frequency Ordered Stop   12/01/17 2100  ceFEPIme (MAXIPIME) 2 g in sodium chloride 0.9 % 100 mL IVPB     2 g 200 mL/hr over 30 Minutes Intravenous Every 24 hours 11/28/2017 2119     12/10/2017 2115  ceFEPIme (MAXIPIME) 2 g in sodium chloride 0.9 % 100 mL IVPB     2 g 200 mL/hr over 30 Minutes Intravenous  Once 12/12/2017 2100 12/22/2017 2240   12/24/2017 2115  metroNIDAZOLE (FLAGYL) IVPB 500 mg     500 mg 100 mL/hr over 60 Minutes Intravenous Every 8 hours 12/05/2017 2100        Assessment/Plan: Impression: Pneumoperitoneum.  Surprisingly, the patient has no abdominal pain and a negative abdominal examination.  She has no physical exam evidence of peritonitis.  She has refused NG tube placement.  At this point, no need to push the issue.  She does realize that she could die from this perforation.  She also realizes that she is in a high risk surgical candidate.  She does not need acute surgical intervention at this time.  Would continue current management.  Will get upper GI series tomorrow to see whether the perforation has sealed.  LOS: 1 day    Aviva Signs 12/01/2017

## 2017-12-02 ENCOUNTER — Inpatient Hospital Stay (HOSPITAL_COMMUNITY): Payer: Medicaid Other

## 2017-12-02 ENCOUNTER — Encounter (HOSPITAL_COMMUNITY): Payer: Self-pay | Admitting: Primary Care

## 2017-12-02 DIAGNOSIS — Z515 Encounter for palliative care: Secondary | ICD-10-CM

## 2017-12-02 DIAGNOSIS — K631 Perforation of intestine (nontraumatic): Secondary | ICD-10-CM

## 2017-12-02 DIAGNOSIS — Z7189 Other specified counseling: Secondary | ICD-10-CM

## 2017-12-02 LAB — BLOOD GAS, ARTERIAL
ACID-BASE DEFICIT: 13.8 mmol/L — AB (ref 0.0–2.0)
BICARBONATE: 13.6 mmol/L — AB (ref 20.0–28.0)
DRAWN BY: 28459
FIO2: 21
O2 Saturation: 90.4 %
PCO2 ART: 28.6 mmHg — AB (ref 32.0–48.0)
PH ART: 7.246 — AB (ref 7.350–7.450)
PO2 ART: 68 mmHg — AB (ref 83.0–108.0)
Patient temperature: 37

## 2017-12-02 LAB — BASIC METABOLIC PANEL
ANION GAP: 6 (ref 5–15)
BUN: 51 mg/dL — ABNORMAL HIGH (ref 6–20)
CALCIUM: 6.5 mg/dL — AB (ref 8.9–10.3)
CO2: 12 mmol/L — AB (ref 22–32)
Chloride: 119 mmol/L — ABNORMAL HIGH (ref 98–111)
Creatinine, Ser: 3.54 mg/dL — ABNORMAL HIGH (ref 0.44–1.00)
GFR calc Af Amer: 17 mL/min — ABNORMAL LOW (ref >60)
GFR calc non Af Amer: 15 mL/min — ABNORMAL LOW (ref >60)
Glucose, Bld: 99 mg/dL (ref 70–99)
Potassium: 3.7 mmol/L (ref 3.5–5.1)
Sodium: 137 mmol/L (ref 135–145)

## 2017-12-02 LAB — CBC
HEMATOCRIT: 24.3 % — AB (ref 36.0–46.0)
Hemoglobin: 7.6 g/dL — ABNORMAL LOW (ref 12.0–15.0)
MCH: 29.5 pg (ref 26.0–34.0)
MCHC: 31.3 g/dL (ref 30.0–36.0)
MCV: 94.2 fL (ref 78.0–100.0)
Platelets: 270 10*3/uL (ref 150–400)
RBC: 2.58 MIL/uL — ABNORMAL LOW (ref 3.87–5.11)
RDW: 15.7 % — AB (ref 11.5–15.5)
WBC: 6.7 10*3/uL (ref 4.0–10.5)

## 2017-12-02 LAB — LACTIC ACID, PLASMA: Lactic Acid, Venous: 1.1 mmol/L (ref 0.5–1.9)

## 2017-12-02 LAB — HEMOGLOBIN AND HEMATOCRIT, BLOOD
HEMATOCRIT: 29.3 % — AB (ref 36.0–46.0)
Hemoglobin: 9.1 g/dL — ABNORMAL LOW (ref 12.0–15.0)

## 2017-12-02 LAB — RETICULOCYTES
RBC.: 3.15 MIL/uL — ABNORMAL LOW (ref 3.87–5.11)
Retic Count, Absolute: 69.3 K/uL (ref 19.0–186.0)
Retic Ct Pct: 2.2 % (ref 0.4–3.1)

## 2017-12-02 LAB — FERRITIN: FERRITIN: 62 ng/mL (ref 11–307)

## 2017-12-02 LAB — FOLATE: Folate: 2.9 ng/mL — ABNORMAL LOW (ref >5.9)

## 2017-12-02 LAB — IRON AND TIBC
Iron: 9 ug/dL — ABNORMAL LOW (ref 28–170)
Saturation Ratios: 5 % — ABNORMAL LOW (ref 10.4–31.8)
TIBC: 171 ug/dL — ABNORMAL LOW (ref 250–450)
UIBC: 162 ug/dL

## 2017-12-02 LAB — VITAMIN B12: Vitamin B-12: 672 pg/mL (ref 180–914)

## 2017-12-02 MED ORDER — DEXTROSE-NACL 5-0.9 % IV SOLN
INTRAVENOUS | Status: DC
  Administered 2017-12-02 – 2017-12-03 (×2): via INTRAVENOUS

## 2017-12-02 MED ORDER — DAKINS (1/4 STRENGTH) 0.125 % EX SOLN
Freq: Every day | CUTANEOUS | Status: AC
  Administered 2017-12-02 – 2017-12-04 (×3)
  Filled 2017-12-02: qty 473

## 2017-12-02 MED ORDER — SODIUM CHLORIDE 0.9 % IV SOLN
1.0000 g | INTRAVENOUS | Status: DC
  Administered 2017-12-02 – 2017-12-03 (×2): 1 g via INTRAVENOUS
  Filled 2017-12-02 (×3): qty 1

## 2017-12-02 MED ORDER — FUROSEMIDE 10 MG/ML IJ SOLN
80.0000 mg | Freq: Two times a day (BID) | INTRAMUSCULAR | Status: DC
  Administered 2017-12-02 (×2): 80 mg via INTRAVENOUS
  Filled 2017-12-02 (×3): qty 8

## 2017-12-02 MED ORDER — AMMONIUM LACTATE 12 % EX LOTN
TOPICAL_LOTION | Freq: Every day | CUTANEOUS | Status: DC
  Administered 2017-12-02: 18:00:00 via TOPICAL
  Administered 2017-12-03: 1 via TOPICAL
  Administered 2017-12-04 – 2017-12-07 (×4): via TOPICAL
  Filled 2017-12-02: qty 400

## 2017-12-02 NOTE — Progress Notes (Signed)
Dressing change complete on right lower leg. Patient tolerated well.

## 2017-12-02 NOTE — Progress Notes (Signed)
PROGRESS NOTE    [name]  YQI:[id number] DOB: 1970-12-13 DOA: 12/06/2017 PCP: Soyla Dryer, PA-C   Brief Narrative:  HPI On 12/26/2017 by Dr. Mitzi Hansen [name] is a 47 y.o. female with medical history significant for atrial fibrillation on Eliquis, chronic diastolic CHF, hypertension, CKD IV, and chronic anemia, now presenting to the emergency department for evaluation of acute upper abdominal pain.  Patient reports that she developed acute onset of pain in the right upper quadrant this morning with radiation towards the right shoulder.  Pain is rated as severe, constant, with no alleviating or exacerbating factors identified, and without vomiting, diarrhea, fevers, or chills.  She denies any chest pain or palpitations and denies any change in her chronic dyspnea.  She has never experienced these symptoms previously.  Denies melena or hematochezia.  Interim history Patient admitted for pneumoperitoneum. General surgery consulted, no surgery at this time. Plan treat medically. Today, patient now appears to have anemia, worsening creatinine, and metabolic acidosis. General surgery discussed patient's prognosis with her and family at bedside, patient understands that surgery is not an option at this time. She is now a DNR. Nephrology consulted for worsening renal failure. Palliative care consulted for [place].   Assessment & Plan   Abdominal pain secondary to Pneumoperitoneum -Patient presented with RUQ abdomina pain -CT abd/pelvis: free air in the abdomen suggests perforated viscus. Underlying cause not seen.  -Currently NPO, gentle IVF given history of CHF -NG tube was placed on admission however patient pulled it out -General surgery consulted and appreciated, recommended treat medically with antibiotics as she is a high risk surgical candidate given her comorbidities. She does not want surgery at this time and understands the risks with and without surgery. She does not want  to be intubated or have CPR and has opted for DNR.  -Currently on cefepime, flagyl -Abdominal and chest xray ordered for placement of NG tube (patient has NG tube in, however without drainage- question positioning)   Atrial fibrillation  -CHADSVASC 3 (gender, CHF,HTN) -Heart rate in the ED was up to 130s. Patient was placed on diltiazem drip.  -Will continue drip for now until further recommendations from surgery -Eliquis currenlty held   Chronic diastolic heart failure -BNP 328 (reviewed chart, this BNP is much lower than previous) -Suspect BNP elevated given CKD stage IV -Does not appear to be volume overloaded at this time -Diuretics currently held -was placed on gentle IVF given acute illness and NPO status -continue to follow intake/output, daily weights  Acute kidney injury on Chronic kidney disease, Stage IV/ metabolic acidosis  -creatinine currently 3.54, however within stage IV -Nephrology consulted and appreciated -Continue to monitor BMP  Anemia of chronic kidney disease -hemoglobin currently 9.1 (was 7.6 on CBC this morning, however repeated) -Will continue to monitor CBC -anemia panel obtained: Iron 9, TIBC 5, Ferritin 62, folate 2.9 -FOBT pending  Essential hypertension -currently stable and cardizem drip  Chronic leg ulcers -continue wound care  Elevated troponin -No complaints of chest pain -Given that patient has CKD4, troponin not very reliable -trop flat 0.05-0.06-0.05  Hypokalemia -resolved with replacement -Continue to monitor BMP  Left renal mass/ Left adnexal lesion -incidentally noted on CT abd/pelvis -Patient will need outpatient follow   Goals of care -obtained ABG for metabolic acidosis- and patient is acidotic  -she does not wish for intubation or CPR and code status has been changed to DNR -Palliative care consulted for [place]  -Discussed with patient the current situation and  she states understanding and wishes to have the NG tube  removed.   DVT Prophylaxis  Was on Eliquis, currently held  Code Status: Full  Family Communication: Family at bedside  Disposition Plan: Admitted. Continue to monitor in ICU  Consultants General surgery Nephrology Palliative care  Procedures  None  Antibiotics   Anti-infectives (From admission, onward)   Start     Dose/Rate Route Frequency Ordered Stop   12/02/17 2100  ceFEPIme (MAXIPIME) 1 g in sodium chloride 0.9 % 100 mL IVPB     1 g 200 mL/hr over 30 Minutes Intravenous Every 24 hours 12/02/17 0803     12/01/17 2100  ceFEPIme (MAXIPIME) 2 g in sodium chloride 0.9 % 100 mL IVPB  Status:  Discontinued     2 g 200 mL/hr over 30 Minutes Intravenous Every 24 hours 12/07/2017 2119 12/02/17 0803   12/15/2017 2115  ceFEPIme (MAXIPIME) 2 g in sodium chloride 0.9 % 100 mL IVPB     2 g 200 mL/hr over 30 Minutes Intravenous  Once 11/28/2017 2100 12/13/2017 2240   12/21/2017 2115  metroNIDAZOLE (FLAGYL) IVPB 500 mg     500 mg 100 mL/hr over 60 Minutes Intravenous Every 8 hours 12/18/2017 2100        Subjective:   [name] seen and examined today.  She does not have complaints this morning- only wants the NG tube removed. She denies chest pain, shortness of breath, abdominal pain, nausea, vomiting.   Objective:   Vitals:   12/02/17 0000 12/02/17 0400 12/02/17 0500 12/02/17 0757  BP:      Pulse:    90  Resp:    17  Temp: 98.3 F (36.8 C) 98.2 F (36.8 C)  98.1 F (36.7 C)  TempSrc: Axillary Axillary  Axillary  SpO2:    100%  Weight:   121.5 kg   Height:        Intake/Output Summary (Last 24 hours) at 12/02/2017 0842 Last data filed at 12/02/2017 2130 Gross per 24 hour  Intake 1060.99 ml  Output 800 ml  Net 260.99 ml   Filed Weights   12/25/2017 0905 12/09/2017 2343 12/02/17 0500  Weight: 122 kg 121 kg 121.5 kg   Exam  General: Well developed, well nourished, NAD, appears stated age  63: NCAT, mucous membranes moist. NG tube in place  Neck:  Supple  Cardiovascular: S1 S2 auscultated, irregular   Respiratory: Coarse breath sounds with crackles  Abdomen: Soft, nontender, nondistended, + bowel sounds  Extremities: warm dry without cyanosis clubbing. LE edema w/ dressing in place B/L   Neuro: AAOx3, nonfocal  Psych: Normal affect and demeanor  Data Reviewed: I have personally reviewed following labs and imaging studies  CBC: Recent Labs  Lab 11/27/2017 0925 12/01/17 0545 12/02/17 0413  WBC 11.9* 14.6* 6.7  NEUTROABS  --  13.6*  --   HGB 8.4* 10.3* 7.6*  HCT 27.3* 33.7* 24.3*  MCV 95.8 96.3 94.2  PLT 346 323 865   Basic Metabolic Panel: Recent Labs  Lab 12/21/2017 0925 12/01/17 0545 12/02/17 0413  NA 141 138 137  K 3.2* 4.4 3.7  CL 113* 115* 119*  CO2 15* 11* 12*  GLUCOSE 122* 71 99  BUN 41* 44* 51*  CREATININE 2.66* 3.05* 3.54*  CALCIUM 8.2* 8.0* 6.5*  MG  --  1.8  --    GFR: Estimated Creatinine Clearance: 28.9 mL/min (A) (by C-G formula based on SCr of 3.54 mg/dL (H)). Liver Function Tests: Recent Labs  Lab  12/15/2017 0925 12/01/17 0545  AST 10* 12*  ALT <5 6  ALKPHOS 65 43  BILITOT 1.1 1.2  PROT 8.4* 7.4  ALBUMIN 2.8* 2.4*   Recent Labs  Lab 12/12/2017 0925  LIPASE 49   No results for input(s): AMMONIA in the last 168 hours. Coagulation Profile: Recent Labs  Lab 12/10/2017 2201  INR 1.48   Cardiac Enzymes: Recent Labs  Lab 11/27/2017 0925 12/16/2017 1442 12/01/17 0059 12/01/17 0545  TROPONINI 0.05* 0.05* 0.06* 0.05*   BNP (last 3 results) No results for input(s): PROBNP in the last 8760 hours. HbA1C: No results for input(s): HGBA1C in the last 72 hours. CBG: No results for input(s): GLUCAP in the last 168 hours. Lipid Profile: No results for input(s): CHOL, HDL, LDLCALC, TRIG, CHOLHDL, LDLDIRECT in the last 72 hours. Thyroid Function Tests: No results for input(s): TSH, T4TOTAL, FREET4, T3FREE, THYROIDAB in the last 72 hours. Anemia Panel: No results for input(s): VITAMINB12,  FOLATE, FERRITIN, TIBC, IRON, RETICCTPCT in the last 72 hours. Urine analysis:    Component Value Date/Time   COLORURINE YELLOW 12/25/2017 1120   APPEARANCEUR CLEAR 12/22/2017 1120   LABSPEC 1.014 12/23/2017 1120   PHURINE 5.0 12/24/2017 1120   GLUCOSEU NEGATIVE 11/27/2017 1120   HGBUR NEGATIVE 12/21/2017 1120   [place] 11/26/2017 1120   KETONESUR NEGATIVE 11/29/2017 1120   PROTEINUR 30 (A) 12/15/2017 1120   UROBILINOGEN 0.2 12/07/2014 0014   NITRITE NEGATIVE 11/26/2017 1120   LEUKOCYTESUR NEGATIVE 12/21/2017 1120   Sepsis Labs: @LABRCNTIP (procalcitonin:4,lacticidven:4)  ) Recent Results (from the past 240 hour(s))  MRSA PCR Screening     Status: None   Collection Time: 12/05/2017 11:21 PM  Result Value Ref Range Status   MRSA by PCR NEGATIVE NEGATIVE Final    Comment:        The GeneXpert MRSA Assay (FDA approved for NASAL specimens only), is one component of a comprehensive MRSA colonization surveillance program. It is not intended to diagnose MRSA infection nor to guide or monitor treatment for MRSA infections. Performed at [facility], [address], [place], [place] [address]       Radiology Studies: Ct Abdomen Pelvis Wo Contrast  Result Date: 12/12/2017 CLINICAL DATA:  Acute abdominal pain. EXAM: CT ABDOMEN AND PELVIS WITHOUT CONTRAST TECHNIQUE: Multidetector CT imaging of the abdomen and pelvis was performed following the standard protocol without IV contrast. COMPARISON:  CT scan January 02, 2012 FINDINGS: Lower chest: Small calcified granulomas in the lung bases. No other acute abnormalities in the lung bases. Cardiomegaly. Small pericardial effusion. Coronary artery calcifications. Hepatobiliary: The liver demonstrates a nodular contour suggesting cirrhosis. No liver masses noted. The gallbladder is mildly distended but otherwise normal. Right upper quadrant ultrasound from today demonstrated no gallbladder abnormalities. Pancreas: Unremarkable. No  pancreatic ductal dilatation or surrounding inflammatory changes. Spleen: Normal in size without focal abnormality. Adrenals/Urinary Tract: The adrenal glands are normal. There appear to be 2 right renal cysts. I suspect a small cyst in the left kidney as well. An exophytic mass off the left kidney on series 2, image 38 demonstrates an attenuation of 33 Hounsfield units and this mass measures 10 mm. No hydronephrosis. A few small stones are seen in the right kidney. No ureterectasis or ureteral stone. The bladder is unremarkable. Stomach/Bowel: The stomach and small bowel is normal with no obstruction. The colon is unremarkable. Visualized portions of the appendix are normal. Vascular/Lymphatic: Atherosclerotic changes are seen in the nonaneurysmal aorta. Shotty nodes are seen in the retroperitoneum and gastrohepatic  ligament. No gross adenopathy. Reproductive: The left ovary is somewhat enlarged measuring 6.5 cm. A rounded region of decreased echogenicity within the ovary demonstrates an attenuation of 40 Hounsfield units. The right ovary is normal in appearance. The uterus is unremarkable. Other: Free air seen in the abdomen with no identified cause. There is ascites as well, particularly adjacent to the liver and spleen, in the pericolic gutters, and in the pelvis. Musculoskeletal: No acute or significant osseous findings. IMPRESSION: 1. Free air in the abdomen suggests perforated viscus. An underlying cause is not seen. 2. Moderate ascites in the abdomen and pelvis. 3. Enlarged left ovary containing a 5 cm rounded hypoechoic region. The hypoechoic region could represent a hemorrhagic cyst but is nonspecific. Recommend pelvic ultrasound for further evaluation of the left ovary. 4. Cirrhotic liver. 5. 10 mm exophytic mass off the left kidney with an attenuation of 33 Hounsfield units. This could represent a solid mass or hyperdense cyst. An ultrasound may further characterize when the patient is able. 6.  Nonobstructive renal stones. 7. Atherosclerotic changes in the nonaneurysmal aorta. Findings called to Dr. Ashok Cordia. Electronically Signed   By: Dorise Bullion III M.D   On: 12/23/2017 20:50   Dg Chest 1 View  Result Date: 12/17/2017 CLINICAL DATA:  Evaluate NG tube placement EXAM: CHEST  1 VIEW COMPARISON:  November 30, 2017 FINDINGS: The distal NG tube is not well seen due to poor penetration. However, it appears to terminate below today's study, within the abdomen. No other change. IMPRESSION: The distal NG tube is not well seen due to poor penetration. However, it appears to terminate in the abdomen, below the bottom of today's study. Electronically Signed   By: Dorise Bullion III M.D   On: 11/29/2017 23:25   Dg Chest 2 View  Result Date: 12/21/2017 CLINICAL DATA:  Shortness of breath, RIGHT upper abdominal pain, RIGHT shoulder pain, awoke patient from sleep, history CHF, essential benign hypertension, atrial fibrillation, pulmonary hypertension EXAM: CHEST - 2 VIEW COMPARISON:  04/04/2017 FINDINGS: Enlargement of cardiac silhouette. Mediastinal contours and pulmonary vascularity normal. Mild RIGHT basilar atelectasis. Chronic accentuation of pulmonary markings, stable. No acute infiltrate, pleural effusion or pneumothorax. Bones unremarkable. IMPRESSION: Enlargement of cardiac silhouette with RIGHT basilar atelectasis. Electronically Signed   By: Lavonia Dana M.D.   On: 12/09/2017 17:31   Dg Chest Port 1 View  Result Date: 12/01/2017 CLINICAL DATA:  Post nasogastric tube placement EXAM: PORTABLE CHEST 1 VIEW COMPARISON:  Portable exam 1502 hours compared to 12/07/2017 FINDINGS: Nasogastric tube extends into stomach, tip not visualized. RIGHT arm PICC line tip projects over SVC. Enlargement of cardiac silhouette. Slight pulmonary vascular congestion. No gross acute infiltrate, pleural effusion or pneumothorax. IMPRESSION: Enlargement of cardiac silhouette. No acute abnormalities. Electronically Signed    By: Lavonia Dana M.D.   On: 12/01/2017 15:18   US Abdomen Limited Ruq  Result Date: 12/26/2017 CLINICAL DATA:  Right upper quadrant abdominal pain. Chronic kidney disease and hypertension. EXAM: ULTRASOUND ABDOMEN LIMITED RIGHT UPPER QUADRANT COMPARISON:  Renal ultrasound dated 06/30/2016 and abdomen and pelvis CT dated 01/02/2012 FINDINGS: Gallbladder: No gallstones or wall thickening visualized. No sonographic Murphy sign noted by sonographer. Common bile duct: Diameter: 4.1 mm Liver: Mildly irregular borders. Normal echotexture. Portal vein is patent on color Doppler imaging with normal direction of blood flow towards the liver. Also noted is a markedly echogenic right kidney with progressive increased echogenicity, containing a small cyst. IMPRESSION: 1. No acute abnormality. 2. Markedly echogenic right kidney with  progression, compatible with medical renal disease. Electronically Signed   By: Claudie Revering M.D.   On: 12/15/2017 10:27     Scheduled Meds: . Chlorhexidine Gluconate Cloth  6 each Topical Daily  . Influenza vac split quadrivalent PF  0.5 mL Intramuscular Tomorrow-1000  . pantoprazole  40 mg Intravenous Q12H  . sodium chloride flush  10-40 mL Intracatheter Q12H  . sodium chloride flush  3 mL Intravenous Q12H   Continuous Infusions: . sodium chloride Stopped (12/02/17 0719)  . ceFEPime (MAXIPIME) IV    . diltiazem (CARDIZEM) infusion 7.5 mg/hr (12/02/17 0833)  . metronidazole Stopped (12/02/17 0604)     LOS: 2 days   Time Spent in minutes   45 minutes (greater than 50% of time spent with patient face to face, as well as reviewing records, calling consults, and formulating a plan)   Cristal Ford D.O. on 12/02/2017 at 8:42 AM  Between 7am to 7pm - Please see pager noted on [email or web address]  After 7pm go to [email or web address]  And look for the night coverage person covering for me after hours  [facility] Office  [phone]

## 2017-12-02 NOTE — Consult Note (Signed)
Consultation Note Date: 12/02/2017   Patient Name: [name]  DOB: 03/15/1970  MRN: [id number]  Age / Sex: 47 y.o., female  PCP: Soyla Dryer, PA-C Referring Physician: Cristal Ford, DO  Reason for Consultation: Establishing goals of care and Psychosocial/spiritual support  HPI/Patient Profile: 47 y.o. female  with past medical history of atrial flutter, persistent atrial fibrillation, chronic diastolic heart failure, pulmonary hypertension, stage III kidney disease, high blood pressure, morbid obesity, hypertensive heart disease, gout, anemia admitted on 12/12/2017 with pneumoperitoneum, atrial fibrillation.   Clinical Assessment and Goals of Care: [name], [name], is resting quietly in bed.  She is sleeping soundly, and does not wake to voice or touch.  When pressed she will open and eyes briefly.  Present today at bedside is sister, Nahima Ales.  [name] and I talk in detail about Liona's health problems.  We review her labs in detail, and talk about her health history.  We reviewed the treatment plan in detail including IV antibiotics, IV fluids, medications for heart rate, pain and anxiety.  I share the concerns of the medical team that she will continue to have declines, multi's organ system failure.  We talked about 6 hours, 12 hours, 24 hours, giving time for her body and God's will.  I share my concern about Sharisse's current state.  We talked about Fallen's choice to not seek surgery, or life support.  At this point, [name] becomes tearful. We review her medications.  She had been given pain and anxiety medicines recently.  I share that this is likely the cause of her sleepiness, and that the body needs to rest to heal.    We talked about [facility] POA, see below. We talked about CODE STATUS, see below.  We plan for family meeting tomorrow 10/8 at 89 AM.   HCPOA NEXT OF KIN -sister, Ashland Osmer  tells me that [name] is not married, has no children, and her parents are deceased.  [name] shares that [name] has not made healthcare power of attorney, she and sister [name] are [name]'s only siblings. Significant other/boyfriend, [name], is not a Marine scientist.   SUMMARY OF RECOMMENDATIONS   24 to 48 hours for outcomes. Family meeting with sister [name] tomorrow 10/8 at 10 AM.  Code Status/Advance Care Planning:  DNR -CODE STATUS discussed previously by hospitalist and general surgeon.  I share with sister [name] that [name] has elected no surgery, no life support.  [name] becomes tearful.  I ask if she and Joanie Coddington can respect [name]'s wishes.  At this point, she nods affirmatively, and questions out loud, "why did not you take care of yourself".  Symptom Management:   Per hospitalist, no additional needs at this time.  Pain and anxiety medications are adequate  Palliative Prophylaxis:   Frequent Pain Assessment, Palliative Wound Care and Turn Reposition  Additional Recommendations (Limitations, Scope, Preferences):  At this point continue to treat the treatable but no CPR, no intubation, no surgery.  Psycho-social/Spiritual:   Desire for further Chaplaincy support:no  Additional Recommendations:  Caregiving  Support/Resources and Education on Hospice  Prognosis:   < 2 weeks, would not be surprising based on poor health prior to admit, bowel perforation, patient's desire to not seek surgery.  Discharge Planning: To be determined, in hospital death would not be surprising.      Primary Diagnoses: Present on Admission: . Bowel perforation ([place]) . Accelerated hypertension . Atrial fibrillation with RVR ([place]) . Pulmonary hypertension ([place]) . Chronic diastolic heart failure ([place]) . CKD (chronic kidney disease), stage IV ([place]) . Hypokalemia . Left renal mass . Left ovarian cyst . Elevated troponin . Normocytic anemia . Chronic skin ulcer of lower leg ([place])   I have  reviewed the medical record, interviewed the patient and family, and examined the patient. The following aspects are pertinent.  Past Medical History:  Diagnosis Date  . Anemia   . Atrial flutter ([place])   . Cellulitis   . CHF (congestive heart failure) ([place])   . Chronic diastolic heart failure ([place])    a. Amyloidosis work-up negative 2013  b. 12/07/2014 ECHO EF 55-60%. LA severely dilated. RV normal RA moderately dilated, Severe LVH.IVC dilated.   . CKD (chronic kidney disease) stage 3, GFR 30-59 ml/min (H[facility])   . Essential hypertension, benign   . Gout   . Gout   . History of cardiac catheterization    a. ectatic coronaries without obstruction 2006 - [facility]  . Hypertensive heart disease   . Morbid obesity ([place])   . Persistent atrial fibrillation   . Pulmonary hypertension ([place])    Social History   Socioeconomic History  . Marital status: Single    Spouse name: Not on file  . Number of children: Not on file  . Years of education: Not on file  . Highest education level: Not on file  Occupational History  . Not on file  Social Needs  . Financial resource strain: Not on file  . Food insecurity:    Worry: Not on file    Inability: Not on file  . Transportation needs:    Medical: Not on file    Non-medical: Not on file  Tobacco Use  . Smoking status: Never Smoker  . Smokeless tobacco: Never Used  Substance and Sexual Activity  . Alcohol use: No  . Drug use: No  . Sexual activity: Yes    Birth control/protection: None  Lifestyle  . Physical activity:    Days per week: Not on file    Minutes per session: Not on file  . Stress: Not on file  Relationships  . Social connections:    Talks on phone: Not on file    Gets together: Not on file    Attends religious service: Not on file    Active member of club or organization: Not on file    Attends meetings of clubs or organizations: Not on file    Relationship status: Not on file  Other Topics Concern  . Not on file    Social History Narrative  . Not on file   Family History  Problem Relation Age of Onset  . Hypertension Mother   . Diabetes Mother   . Heart attack Mother   . Heart attack Father   . Hypertension Sister   . Asthma Sister   . Hypertension Maternal Aunt   . Hypertension Maternal Uncle   . Hypertension Sister   . Cancer Sister        thyroid and Kidney cancer   Scheduled Meds: . ammonium lactate  Topical Q1200  . Chlorhexidine Gluconate Cloth  6 each Topical Daily  . furosemide  80 mg Intravenous BID  . pantoprazole  40 mg Intravenous Q12H  . sodium chloride flush  10-40 mL Intracatheter Q12H  . sodium chloride flush  3 mL Intravenous Q12H  . sodium hypochlorite   Irrigation Q1200   Continuous Infusions: . sodium chloride Stopped (12/02/17 0719)  . ceFEPime (MAXIPIME) IV    . dextrose 5 % and 0.9% NaCl 75 mL/hr at 12/02/17 1019  . diltiazem (CARDIZEM) infusion 7.5 mg/hr (12/02/17 1017)  . metronidazole Stopped (12/02/17 0604)   PRN Meds:.sodium chloride, acetaminophen **OR** acetaminophen, fentaNYL (SUBLIMAZE) injection, hydrALAZINE, LORazepam, ondansetron **OR** ondansetron (ZOFRAN) IV, sodium chloride flush Medications Prior to Admission:  Prior to Admission medications   Medication Sig Start Date End Date Taking? Authorizing Provider  amLODipine (NORVASC) 10 MG tablet TAKE 1 TABLET BY MOUTH DAILY. 09/23/17  Yes Bensimhon, Shaune Pascal, MD  apixaban (ELIQUIS) 5 MG TABS tablet Take 1 tablet (5 mg total) by mouth 2 (two) times daily. 11/23/16  Yes Jettie Booze E, NP  cloNIDine (CATAPRES) 0.3 MG tablet TAKE 1 TABLET BY MOUTH 2 TIMES DAILY. MAY TAKE AN ADDITIONAL TABLET AS NEEDED FOR SBP>180 09/23/17  Yes Bensimhon, Shaune Pascal, MD  hydrALAZINE (APRESOLINE) 100 MG tablet Take 1 tablet (100 mg total) by mouth 3 (three) times daily. 05/16/17  Yes Shirley Friar, PA-C  naproxen sodium (ALEVE) 220 MG tablet Take 220 mg by mouth daily as needed (for pain).   Yes [provider]  potassium chloride SA (K-DUR,KLOR-CON) 20 MEQ tablet TAKE 1 TABLET BY MOUTH 2 TIMES DAILY. 09/23/17  Yes Bensimhon, Shaune Pascal, MD  sodium hypochlorite (DAKIN'S 1/4 STRENGTH) 0.125 % SOLN Irrigate with as directed 2 (two) times daily. 10/26/17  Yes Kathie Dike, MD  spironolactone (ALDACTONE) 25 MG tablet Take 0.5 tablets (12.5 mg total) by mouth daily. 05/13/17  Yes Bensimhon, Shaune Pascal, MD  torsemide (DEMADEX) 20 MG tablet Take 2 tablets (40 mg total) by mouth 2 (two) times daily. Take extra 20 mg tablet once daily as needed for 3 lb weight gain. 07/31/17  Yes Clegg, Amy D, NP   Allergies  Allergen Reactions  . Penicillins Hives and Other (See Comments)    Childhood allergy. Has patient had a PCN reaction causing immediate rash, facial/tongue/throat swelling, SOB or lightheadedness with hypotension: NO Has patient had a PCN reaction causing severe rash involving mucus membranes or skin necrosis: No NO Has patient had a PCN reaction that required hospitalization No NO Has patient had a PCN reaction occurring within the last 10 years: NO If all of the above answers are "NO", then may pro   Review of Systems  Unable to perform ROS: Acuity of condition    Physical Exam  Constitutional: She appears ill.  Cardiovascular: Normal rate.  Pulmonary/Chest: Effort normal. No respiratory distress.  Abdominal: There is tenderness.  Obese abdomen, no guarding.   Neurological:  Will open eyes briefly to request. Does not respond in meaningful way.   Skin: Skin is warm and dry.  Wounds bl le, dry skin.   Nursing note and vitals reviewed.   Vital Signs: BP 127/79   Pulse 90   Temp 98.6 F (37 C) (Axillary)   Resp 19   Ht 5\' 11"  (1.803 m)   Wt 121.5 kg   LMP 11/10/2017   SpO2 100%   BMI 37.36 kg/m  Pain Scale: 0-10 POSS *See Group Information*: S-Acceptable,Sleep, easy to  arouse Pain Score: Asleep   SpO2: SpO2: 100 % O2 Device:SpO2: 100 % O2 Flow Rate: .   IO: Intake/output  summary:   Intake/Output Summary (Last 24 hours) at 12/02/2017 1234 Last data filed at 12/02/2017 2297 Gross per 24 hour  Intake 1057.99 ml  Output 800 ml  Net 257.99 ml    LBM: Last BM Date: 11/29/17 Baseline Weight: Weight: 122 kg Most recent weight: Weight: 121.5 kg     Palliative Assessment/Data:   Flowsheet Rows     Most Recent Value  Intake Tab  Referral Department  Hospitalist  Unit at Time of Referral  ICU  Palliative Care Primary Diagnosis  -- Kendal Hymen. ]  Date Notified  12/02/17  Palliative Care Type  New Palliative care  Reason for referral  Clarify Goals of Care, Psychosocial or Spiritual support  Date of Admission  12/11/2017  Date first seen by Palliative Care  12/02/17  # of days Palliative referral response time  0 Day(s)  # of days IP prior to Palliative referral  2  Clinical Assessment  Palliative Performance Scale Score  20%  Pain Max last 24 hours  Not able to report  Pain Min Last 24 hours  Not able to report  Dyspnea Max Last 24 Hours  Not able to report  Dyspnea Min Last 24 hours  Not able to report  Psychosocial & Spiritual Assessment  Palliative Care Outcomes  Patient/Family meeting held?  Yes  Who was at the meeting?  patient and sister [name] at bedside.   Palliative Care Outcomes  Clarified goals of care, Provided psychosocial or spiritual support  Patient/Family wishes: Interventions discontinued/not started   Mechanical Ventilation      Time In: 1020 Time Out: 1110  Time Total: 50 minutes Greater than 50%  of this time was spent counseling and coordinating care related to the above assessment and plan.  Signed by: Drue Novel, NP   Please contact Palliative Medicine Team phone at [phone] for questions and concerns.  For individual provider: See Shea Evans

## 2017-12-02 NOTE — Consult Note (Signed)
[facility] Nurse wound consult note Reason for Consult:Patient is asleep and does not arouse when spoken to. Sister at bedside. 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  Was referred to wound care center.  It does not appear she kept the September appointment for her right leg.  Wound type:nonhealing chronic Pressure Injury POA: NA Measurement:Right lower leg:  9 cm x 23 cm x 0.3 cm with 75% devitalized necrotic tissue with foul odor Wound bed:75% necrotic 25% pale pink nongranulating, slick Drainage (amount, consistency, odor) moderate serosanguinous with necrotic odor Periwound:Chronic skin changes to bilateral lower legs. Left leg is dry and cracked.  Will apply emollient to this. Right leg.  Cleanse with soap and water and pat dry. Apply Dakins moist gauze to nonnintact wound bed.  Cover with ABD and kerlix. Change daily.    Will not follow at this time.  Please re-consult if needed.  Domenic Moras MSN, RN, FNP-BC CWON Wound, Ostomy, Continence Nurse Pager [phone]

## 2017-12-02 NOTE — Progress Notes (Signed)
Patient states she is being followed for the wounds on her legs at [facility].

## 2017-12-02 NOTE — Progress Notes (Signed)
Subjective: Patient more somnolent this morning.  When I went back to see her, she was more alert.  An NG tube is down now.  Is not draining anything.  She wants it out.  She denies any abdominal pain.  Objective: Vital signs in last 24 hours: Temp:  [97.7 F (36.5 C)-98.3 F (36.8 C)] 98.1 F (36.7 C) (10/07 0757) Pulse Rate:  [78-98] 90 (10/07 0757) Resp:  [17-23] 17 (10/07 0757) BP: (117-146)/(77-90) 127/79 (10/06 2200) SpO2:  [94 %-100 %] 100 % (10/07 0757) Weight:  [121.5 kg] 121.5 kg (10/07 0500) Last BM Date: 11/29/17  Intake/Output from previous day: 10/06 0701 - 10/07 0700 In: 576.7 [I.V.:363.1; IV Piggyback:213.6] Out: 800 [Urine:50; Emesis/NG output:750] Intake/Output this shift: Total I/O In: 484.3 [I.V.:150; IV Piggyback:334.3] Out: -   General appearance: fatigued and slowed mentation GI: Soft, nontender, nondistended.  Occasional bowel sounds appreciated.  No rigidity noted.  Lab Results:  Recent Labs    12/01/17 0545 12/02/17 0413  WBC 14.6* 6.7  HGB 10.3* 7.6*  HCT 33.7* 24.3*  PLT 323 270   BMET Recent Labs    12/01/17 0545 12/02/17 0413  NA 138 137  K 4.4 3.7  CL 115* 119*  CO2 11* 12*  GLUCOSE 71 99  BUN 44* 51*  CREATININE 3.05* 3.54*  CALCIUM 8.0* 6.5*   PT/INR Recent Labs    12/19/2017 2201  LABPROT 17.8*  INR 1.48    Studies/Results: Ct Abdomen Pelvis Wo Contrast  Result Date: 12/02/2017 CLINICAL DATA:  Acute abdominal pain. EXAM: CT ABDOMEN AND PELVIS WITHOUT CONTRAST TECHNIQUE: Multidetector CT imaging of the abdomen and pelvis was performed following the standard protocol without IV contrast. COMPARISON:  CT scan January 02, 2012 FINDINGS: Lower chest: Small calcified granulomas in the lung bases. No other acute abnormalities in the lung bases. Cardiomegaly. Small pericardial effusion. Coronary artery calcifications. Hepatobiliary: The liver demonstrates a nodular contour suggesting cirrhosis. No liver masses noted. The  gallbladder is mildly distended but otherwise normal. Right upper quadrant ultrasound from today demonstrated no gallbladder abnormalities. Pancreas: Unremarkable. No pancreatic ductal dilatation or surrounding inflammatory changes. Spleen: Normal in size without focal abnormality. Adrenals/Urinary Tract: The adrenal glands are normal. There appear to be 2 right renal cysts. I suspect a small cyst in the left kidney as well. An exophytic mass off the left kidney on series 2, image 38 demonstrates an attenuation of 33 Hounsfield units and this mass measures 10 mm. No hydronephrosis. A few small stones are seen in the right kidney. No ureterectasis or ureteral stone. The bladder is unremarkable. Stomach/Bowel: The stomach and small bowel is normal with no obstruction. The colon is unremarkable. Visualized portions of the appendix are normal. Vascular/Lymphatic: Atherosclerotic changes are seen in the nonaneurysmal aorta. Shotty nodes are seen in the retroperitoneum and gastrohepatic ligament. No gross adenopathy. Reproductive: The left ovary is somewhat enlarged measuring 6.5 cm. A rounded region of decreased echogenicity within the ovary demonstrates an attenuation of 40 Hounsfield units. The right ovary is normal in appearance. The uterus is unremarkable. Other: Free air seen in the abdomen with no identified cause. There is ascites as well, particularly adjacent to the liver and spleen, in the pericolic gutters, and in the pelvis. Musculoskeletal: No acute or significant osseous findings. IMPRESSION: 1. Free air in the abdomen suggests perforated viscus. An underlying cause is not seen. 2. Moderate ascites in the abdomen and pelvis. 3. Enlarged left ovary containing a 5 cm rounded hypoechoic region. The hypoechoic region could  represent a hemorrhagic cyst but is nonspecific. Recommend pelvic ultrasound for further evaluation of the left ovary. 4. Cirrhotic liver. 5. 10 mm exophytic mass off the left kidney with an  attenuation of 33 Hounsfield units. This could represent a solid mass or hyperdense cyst. An ultrasound may further characterize when the patient is able. 6. Nonobstructive renal stones. 7. Atherosclerotic changes in the nonaneurysmal aorta. Findings called to Dr. Ashok Cordia. Electronically Signed   By: Dorise Bullion III M.D   On: 12/03/2017 20:50   Dg Chest 1 View  Result Date: 12/11/2017 CLINICAL DATA:  Evaluate NG tube placement EXAM: CHEST  1 VIEW COMPARISON:  November 30, 2017 FINDINGS: The distal NG tube is not well seen due to poor penetration. However, it appears to terminate below today's study, within the abdomen. No other change. IMPRESSION: The distal NG tube is not well seen due to poor penetration. However, it appears to terminate in the abdomen, below the bottom of today's study. Electronically Signed   By: Dorise Bullion III M.D   On: 12/16/2017 23:25   Dg Chest 2 View  Result Date: 12/09/2017 CLINICAL DATA:  Shortness of breath, RIGHT upper abdominal pain, RIGHT shoulder pain, awoke patient from sleep, history CHF, essential benign hypertension, atrial fibrillation, pulmonary hypertension EXAM: CHEST - 2 VIEW COMPARISON:  04/04/2017 FINDINGS: Enlargement of cardiac silhouette. Mediastinal contours and pulmonary vascularity normal. Mild RIGHT basilar atelectasis. Chronic accentuation of pulmonary markings, stable. No acute infiltrate, pleural effusion or pneumothorax. Bones unremarkable. IMPRESSION: Enlargement of cardiac silhouette with RIGHT basilar atelectasis. Electronically Signed   By: Lavonia Dana M.D.   On: 12/06/2017 17:31   Dg Chest Port 1 View  Result Date: 12/01/2017 CLINICAL DATA:  Post nasogastric tube placement EXAM: PORTABLE CHEST 1 VIEW COMPARISON:  Portable exam 1502 hours compared to 12/01/2017 FINDINGS: Nasogastric tube extends into stomach, tip not visualized. RIGHT arm PICC line tip projects over SVC. Enlargement of cardiac silhouette. Slight pulmonary vascular  congestion. No gross acute infiltrate, pleural effusion or pneumothorax. IMPRESSION: Enlargement of cardiac silhouette. No acute abnormalities. Electronically Signed   By: Lavonia Dana M.D.   On: 12/01/2017 15:18   US Abdomen Limited Ruq  Result Date: 12/13/2017 CLINICAL DATA:  Right upper quadrant abdominal pain. Chronic kidney disease and hypertension. EXAM: ULTRASOUND ABDOMEN LIMITED RIGHT UPPER QUADRANT COMPARISON:  Renal ultrasound dated 06/30/2016 and abdomen and pelvis CT dated 01/02/2012 FINDINGS: Gallbladder: No gallstones or wall thickening visualized. No sonographic Murphy sign noted by sonographer. Common bile duct: Diameter: 4.1 mm Liver: Mildly irregular borders. Normal echotexture. Portal vein is patent on color Doppler imaging with normal direction of blood flow towards the liver. Also noted is a markedly echogenic right kidney with progressive increased echogenicity, containing a small cyst. IMPRESSION: 1. No acute abnormality. 2. Markedly echogenic right kidney with progression, compatible with medical renal disease. Electronically Signed   By: Claudie Revering M.D.   On: 12/01/2017 10:27    Anti-infectives: Anti-infectives (From admission, onward)   Start     Dose/Rate Route Frequency Ordered Stop   12/02/17 2100  ceFEPIme (MAXIPIME) 1 g in sodium chloride 0.9 % 100 mL IVPB     1 g 200 mL/hr over 30 Minutes Intravenous Every 24 hours 12/02/17 0803     12/01/17 2100  ceFEPIme (MAXIPIME) 2 g in sodium chloride 0.9 % 100 mL IVPB  Status:  Discontinued     2 g 200 mL/hr over 30 Minutes Intravenous Every 24 hours 12/09/2017 2119 12/02/17 0803  11/29/2017 2115  ceFEPIme (MAXIPIME) 2 g in sodium chloride 0.9 % 100 mL IVPB     2 g 200 mL/hr over 30 Minutes Intravenous  Once 12/10/2017 2100 12/21/2017 2240   12/10/2017 2115  metroNIDAZOLE (FLAGYL) IVPB 500 mg     500 mg 100 mL/hr over 60 Minutes Intravenous Every 8 hours 12/24/2017 2100        Assessment/Plan: Impression: Pneumoperitoneum,  anemia, worsening acute on chronic renal failure, acidosis. Plan: I had a long talk with the patient.  She does not want to be intubated or on a ventilator.  She does not want surgical intervention at this time.  She does understand that she could expire.  Overall, her condition seems to be worsening with impending multiple organ failure.  Chest x-ray and ABG pending.  Will continue to follow with you.  LOS: 2 days    Aviva Signs 12/02/2017

## 2017-12-02 NOTE — Consult Note (Signed)
Reason for Consult: Acute kidney injury superimposed on chronic Referring Physician: Dr. Sherrin [name] is an 47 y.o. female.  HPI: She is a patient who has history of hypertension, atrial fibrillation, obesity, chronic renal failure stage IV presently came with complaints of abdominal pain.  When she was evaluated she was found to have free air in the abdomen and possibility of perforation was entertained and admitted to the hospital.  Presently patient has NG tube and suction.  She states she is feeling much better.  She denies any nausea or vomiting.  Denies also any difficulty breathing.  Consult is called because of worsening of her renal failure.  Past Medical History:  Diagnosis Date  . Anemia   . Atrial flutter ([place])   . Cellulitis   . CHF (congestive heart failure) ([place])   . Chronic diastolic heart failure ([place])    a. Amyloidosis work-up negative 2013  b. 12/07/2014 ECHO EF 55-60%. LA severely dilated. RV normal RA moderately dilated, Severe LVH.IVC dilated.   . CKD (chronic kidney disease) stage 3, GFR 30-59 ml/min (HCC)   . Essential hypertension, benign   . Gout   . Gout   . History of cardiac catheterization    a. ectatic coronaries without obstruction 2006 - [place]  . Hypertensive heart disease   . Morbid obesity ([place])   . Persistent atrial fibrillation   . Pulmonary hypertension ([place])     Past Surgical History:  Procedure Laterality Date  . CARDIOVERSION  01/15/2012   Procedure: CARDIOVERSION;  Surgeon: Jolaine Artist, MD;  Location: [facility];  Service: Cardiovascular;  Laterality: N/A;  . RIGHT HEART CATH N/A 02/13/2017   Procedure: RIGHT HEART CATH;  Surgeon: Jolaine Artist, MD;  Location: [facility] CV LAB;  Service: Cardiovascular;  Laterality: N/A;  . RIGHT HEART CATHETERIZATION N/A 12/26/2011   Procedure: RIGHT HEART CATH;  Surgeon: Jolaine Artist, MD;  Location: [facility] CATH LAB;  Service: Cardiovascular;  Laterality: N/A;  . SKIN BIOPSY   12/18/2011   Procedure: BIOPSY SKIN;  Surgeon: Donato Heinz, MD;  Location: [facility];  Service: General;  Laterality: N/A;  in the minor room.  . TEE WITHOUT CARDIOVERSION  01/15/2012   Procedure: TRANSESOPHAGEAL ECHOCARDIOGRAM (TEE);  Surgeon: Jolaine Artist, MD;  Location: [facility] ENDOSCOPY;  Service: Cardiovascular;  Laterality: N/A;  cardioversion/on xarelto    Family History  Problem Relation Age of Onset  . Hypertension Mother   . Diabetes Mother   . Heart attack Mother   . Heart attack Father   . Hypertension Sister   . Asthma Sister   . Hypertension Maternal Aunt   . Hypertension Maternal Uncle   . Hypertension Sister   . Cancer Sister        thyroid and Kidney cancer    Social History:  reports that she has never smoked. She has never used smokeless tobacco. She reports that she does not drink alcohol or use drugs.  Allergies:  Allergies  Allergen Reactions  . Penicillins Hives and Other (See Comments)    Childhood allergy. Has patient had a PCN reaction causing immediate rash, facial/tongue/throat swelling, SOB or lightheadedness with hypotension: NO Has patient had a PCN reaction causing severe rash involving mucus membranes or skin necrosis: No NO Has patient had a PCN reaction that required hospitalization No NO Has patient had a PCN reaction occurring within the last 10 years: NO If all of the above answers are "NO", then may pro  Medications: I have reviewed the patient's current medications.  Results for orders placed or performed during the hospital encounter of 12/11/2017 (from the past 48 hour(s))  Lipase, blood     Status: None   Collection Time: 12/11/2017  9:25 AM  Result Value Ref Range   Lipase 49 11 - 51 U/L    Comment: Performed at [facility], [address] [place], [place] [address]  Comprehensive metabolic panel     Status: Abnormal   Collection Time: 12/12/2017  9:25 AM  Result Value Ref Range   Sodium 141 135 - 145 mmol/L   Potassium 3.2 (L)  3.5 - 5.1 mmol/L   Chloride 113 (H) 98 - 111 mmol/L   CO2 15 (L) 22 - 32 mmol/L   Glucose, Bld 122 (H) 70 - 99 mg/dL   BUN 41 (H) 6 - 20 mg/dL   Creatinine, Ser 2.66 (H) 0.44 - 1.00 mg/dL   Calcium 8.2 (L) 8.9 - 10.3 mg/dL   Total Protein 8.4 (H) 6.5 - 8.1 g/dL   Albumin 2.8 (L) 3.5 - 5.0 g/dL   AST 10 (L) 15 - 41 U/L   ALT <5 0 - 44 U/L   Alkaline Phosphatase 65 38 - 126 U/L   Total Bilirubin 1.1 0.3 - 1.2 mg/dL   GFR calc non Af Amer 21 (L) >60 mL/min   GFR calc Af Amer 24 (L) >60 mL/min    Comment: (NOTE) The eGFR has been calculated using the CKD EPI equation. This calculation has not been validated in all clinical situations. eGFR's persistently <60 mL/min signify possible Chronic Kidney Disease.    Anion gap 13 5 - 15    Comment: Performed at [facility], [address] [place], [place] [address]  CBC     Status: Abnormal   Collection Time: 11/26/2017  9:25 AM  Result Value Ref Range   WBC 11.9 (H) 4.0 - 10.5 K/uL   RBC 2.85 (L) 3.87 - 5.11 MIL/uL   Hemoglobin 8.4 (L) 12.0 - 15.0 g/dL   HCT 27.3 (L) 36.0 - 46.0 %   MCV 95.8 78.0 - 100.0 fL   MCH 29.5 26.0 - 34.0 pg   MCHC 30.8 30.0 - 36.0 g/dL   RDW 15.3 11.5 - 15.5 %   Platelets 346 150 - 400 K/uL    Comment: Performed at [facility], [address] [place], [place] [address]  Troponin I     Status: Abnormal   Collection Time: 12/26/2017  9:25 AM  Result Value Ref Range   Troponin I 0.05 (HH) <0.03 ng/mL    Comment: CRITICAL RESULT CALLED TO, READ BACK BY AND VERIFIED WITH: CREWS,M AT 1054 ON 12/22/2017 BY MOSLEY,J Performed at [facility], [address], [place],  [address]   I-Stat beta hCG blood, ED     Status: None   Collection Time: 12/12/2017  9:34 AM  Result Value Ref Range   I-stat hCG, quantitative <5.0 <5 mIU/mL   Comment 3            Comment:   GEST. AGE      CONC.  (mIU/mL)   <=1 WEEK        5 - 50     2 WEEKS       50 - 500     3 WEEKS       100 - 10,000     4 WEEKS     1,000 - 30,000  FEMALE AND NON-PREGNANT FEMALE:     LESS THAN 5 mIU/mL   Urinalysis, Routine w reflex microscopic     Status: Abnormal   Collection Time: 12/09/2017 11:20 AM  Result Value Ref Range   Color, Urine YELLOW YELLOW   APPearance CLEAR CLEAR   Specific Gravity, Urine 1.014 1.005 - 1.030   pH 5.0 5.0 - 8.0   Glucose, UA NEGATIVE NEGATIVE mg/dL   Hgb urine dipstick NEGATIVE NEGATIVE   Bilirubin Urine NEGATIVE NEGATIVE   Ketones, ur NEGATIVE NEGATIVE mg/dL   Protein, ur 30 (A) NEGATIVE mg/dL   Nitrite NEGATIVE NEGATIVE   Leukocytes, UA NEGATIVE NEGATIVE   RBC / HPF 0-5 0 - 5 RBC/hpf   WBC, UA 0-5 0 - 5 WBC/hpf   Bacteria, UA RARE (A) NONE SEEN   Squamous Epithelial / LPF 0-5 0 - 5    Comment: Performed at [facility], [address], [place], [place] [address]  Troponin I     Status: Abnormal   Collection Time: 11/29/2017  2:42 PM  Result Value Ref Range   Troponin I 0.05 (HH) <0.03 ng/mL    Comment: CRITICAL RESULT CALLED TO, READ BACK BY AND VERIFIED WITH: TUTTLE,A. AT 12/20/2017 BY EVA Performed at [facility], [address] [place], [place] [address]   Brain natriuretic peptide     Status: Abnormal   Collection Time: 12/23/2017  2:42 PM  Result Value Ref Range   B Natriuretic Peptide 328.0 (H) 0.0 - 100.0 pg/mL    Comment: Performed at [facility], [address] [place], [place] [address]  Protime-INR     Status: Abnormal   Collection Time: 12/01/2017 10:01 PM  Result Value Ref Range   Prothrombin Time 17.8 (H) 11.4 - 15.2 seconds   INR 1.48     Comment: Performed at [facility], [address], [place], [place] [address]  MRSA PCR Screening     Status: None   Collection Time: 12/14/2017 11:21 PM  Result Value Ref Range   MRSA by PCR NEGATIVE NEGATIVE    Comment:        The GeneXpert MRSA Assay (FDA approved for NASAL specimens only), is one component of a comprehensive MRSA colonization surveillance program. It is not intended to diagnose MRSA infection nor to guide  or monitor treatment for MRSA infections. Performed at [facility], [address] [place], [place] [address]   Type and screen [facility]     Status: None   Collection Time: 12/01/17 12:59 AM  Result Value Ref Range   ABO/RH(D) O POS    Antibody Screen NEG    Sample Expiration      12/04/2017 Performed at [facility], [address] [place], [place] [address]   Lactic acid, plasma     Status: Abnormal   Collection Time: 12/01/17 12:59 AM  Result Value Ref Range   Lactic Acid, Venous 2.4 (HH) 0.5 - 1.9 mmol/L    Comment: CRITICAL RESULT CALLED TO, READ BACK BY AND VERIFIED WITH: HEARN,J @ 0147 ON 12/01/17 BY JUW Performed at [facility], [address], [place], [place] [address]   Troponin I (q 6hr x 3)     Status: Abnormal   Collection Time: 12/01/17 12:59 AM  Result Value Ref Range   Troponin I 0.06 (HH) <0.03 ng/mL    Comment: CRITICAL VALUE NOTED.  VALUE IS CONSISTENT WITH PREVIOUSLY REPORTED AND CALLED VALUE. Performed at [facility], [address] [place],  [address]  Lactic acid, plasma     Status: Abnormal   Collection Time: 12/01/17  5:44 AM  Result Value Ref Range   Lactic Acid, Venous 2.5 (HH) 0.5 - 1.9 mmol/L    Comment: CRITICAL RESULT CALLED TO, READ BACK BY AND VERIFIED WITH: JEFFERSON,V AT 1937 ON 12/01/2017 BY MOSLEY,J Performed at [facility], [address] [place], [place] [address]   Comprehensive metabolic panel     Status: Abnormal   Collection Time: 12/01/17  5:45 AM  Result Value Ref Range   Sodium 138 135 - 145 mmol/L   Potassium 4.4 3.5 - 5.1 mmol/L    Comment: DELTA CHECK NOTED NO VISIBLE HEMOLYSIS    Chloride 115 (H) 98 - 111 mmol/L   CO2 11 (L) 22 - 32 mmol/L   Glucose, Bld 71 70 - 99 mg/dL   BUN 44 (H) 6 - 20 mg/dL   Creatinine, Ser 3.05 (H) 0.44 - 1.00 mg/dL   Calcium 8.0 (L) 8.9 - 10.3 mg/dL   Total Protein 7.4 6.5 - 8.1 g/dL   Albumin 2.4 (L) 3.5 - 5.0 g/dL   AST 12 (L) 15 - 41 U/L   ALT 6 0 - 44 U/L    Alkaline Phosphatase 43 38 - 126 U/L   Total Bilirubin 1.2 0.3 - 1.2 mg/dL   GFR calc non Af Amer 17 (L) >60 mL/min   GFR calc Af Amer 20 (L) >60 mL/min    Comment: (NOTE) The eGFR has been calculated using the CKD EPI equation. This calculation has not been validated in all clinical situations. eGFR's persistently <60 mL/min signify possible Chronic Kidney Disease.    Anion gap 12 5 - 15    Comment: Performed at [facility], [address], [place], [place] [address]  CBC WITH DIFFERENTIAL     Status: Abnormal   Collection Time: 12/01/17  5:45 AM  Result Value Ref Range   WBC 14.6 (H) 4.0 - 10.5 K/uL   RBC 3.50 (L) 3.87 - 5.11 MIL/uL   Hemoglobin 10.3 (L) 12.0 - 15.0 g/dL   HCT 33.7 (L) 36.0 - 46.0 %   MCV 96.3 78.0 - 100.0 fL   MCH 29.4 26.0 - 34.0 pg   MCHC 30.6 30.0 - 36.0 g/dL   RDW 15.6 (H) 11.5 - 15.5 %   Platelets 323 150 - 400 K/uL   Neutrophils Relative % 93 %   Lymphocytes Relative 2 %   Monocytes Relative 4 %   Eosinophils Relative 1 %   Basophils Relative 0 %   Neutro Abs 13.6 (H) 1.7 - 7.7 K/uL   Lymphs Abs 0.3 (L) 0.7 - 4.0 K/uL   Monocytes Absolute 0.6 0.1 - 1.0 K/uL   Eosinophils Absolute 0.1 0.0 - 0.7 K/uL   Basophils Absolute 0.0 0.0 - 0.1 K/uL   WBC Morphology DOHLE BODIES     Comment: MODERATE LEFT SHIFT (>5% METAS AND MYELOS,OCC PRO NOTED) TOXIC GRANULATION VACUOLATED NEUTROPHILS Performed at [facility], [address] [place], [name] [address]   Magnesium     Status: None   Collection Time: 12/01/17  5:45 AM  Result Value Ref Range   Magnesium 1.8 1.7 - 2.4 mg/dL    Comment: Performed at [facility], [address] [place], [place] [address]  Troponin I (q 6hr x 3)     Status: Abnormal   Collection Time: 12/01/17  5:45 AM  Result Value Ref Range   Troponin I 0.05 (HH) <0.03 ng/mL    Comment:  CRITICAL VALUE NOTED.  VALUE IS CONSISTENT WITH PREVIOUSLY REPORTED AND CALLED VALUE. Performed at [facility], [address] [place] [address], [place], [place]  [address]   Lactic acid, plasma     Status: Abnormal   Collection Time: 12/01/17  8:22 AM  Result Value Ref Range   Lactic Acid, Venous 2.0 (HH) 0.5 - 1.9 mmol/L    Comment: CRITICAL RESULT CALLED TO, READ BACK BY AND VERIFIED WITH: HILTON,L AT 0919 ON 12/01/2017 BY MOSLEY,J Performed at [facility], [address] [place], [place] [address]   CBC     Status: Abnormal   Collection Time: 12/02/17  4:13 AM  Result Value Ref Range   WBC 6.7 4.0 - 10.5 K/uL   RBC 2.58 (L) 3.87 - 5.11 MIL/uL   Hemoglobin 7.6 (L) 12.0 - 15.0 g/dL    Comment: DELTA CHECK NOTED REPEATED TO VERIFY    HCT 24.3 (L) 36.0 - 46.0 %   MCV 94.2 78.0 - 100.0 fL   MCH 29.5 26.0 - 34.0 pg   MCHC 31.3 30.0 - 36.0 g/dL   RDW 15.7 (H) 11.5 - 15.5 %   Platelets 270 150 - 400 K/uL    Comment: Performed at [facility], [address], [place], [place] [address]  Basic metabolic panel     Status: Abnormal   Collection Time: 12/02/17  4:13 AM  Result Value Ref Range   Sodium 137 135 - 145 mmol/L   Potassium 3.7 3.5 - 5.1 mmol/L   Chloride 119 (H) 98 - 111 mmol/L   CO2 12 (L) 22 - 32 mmol/L   Glucose, Bld 99 70 - 99 mg/dL   BUN 51 (H) 6 - 20 mg/dL   Creatinine, Ser 3.54 (H) 0.44 - 1.00 mg/dL   Calcium 6.5 (L) 8.9 - 10.3 mg/dL   GFR calc non Af Amer 15 (L) >60 mL/min   GFR calc Af Amer 17 (L) >60 mL/min    Comment: (NOTE) The eGFR has been calculated using the CKD EPI equation. This calculation has not been validated in all clinical situations. eGFR's persistently <60 mL/min signify possible Chronic Kidney Disease.    Anion gap 6 5 - 15    Comment: Performed at [facility], [address], [place], [place] [address]  Lactic acid, plasma     Status: None   Collection Time: 12/02/17  4:14 AM  Result Value Ref Range   Lactic Acid, Venous 1.1 0.5 - 1.9 mmol/L    Comment: Performed at [facility], [address], [place], [place] [address]  Hemoglobin and hematocrit, blood     Status: Abnormal   Collection Time:  12/02/17  8:24 AM  Result Value Ref Range   Hemoglobin 9.1 (L) 12.0 - 15.0 g/dL   HCT 29.3 (L) 36.0 - 46.0 %    Comment: Performed at [facility], [address] [place], [place] [address]  Reticulocytes     Status: Abnormal   Collection Time: 12/02/17  8:24 AM  Result Value Ref Range   Retic Ct Pct 2.2 0.4 - 3.1 %   RBC. 3.15 (L) 3.87 - 5.11 MIL/uL   Retic Count, Absolute 69.3 19.0 - 186.0 K/uL    Comment: Performed at [facility], [address] [place], [place] [address]    Ct Abdomen Pelvis Wo Contrast  Result Date: 12/24/2017 CLINICAL DATA:  Acute abdominal pain. EXAM: CT ABDOMEN AND PELVIS WITHOUT CONTRAST TECHNIQUE: Multidetector CT imaging of the abdomen and pelvis was performed following the standard protocol  without IV contrast. COMPARISON:  CT scan January 02, 2012 FINDINGS: Lower chest: Small calcified granulomas in the lung bases. No other acute abnormalities in the lung bases. Cardiomegaly. Small pericardial effusion. Coronary artery calcifications. Hepatobiliary: The liver demonstrates a nodular contour suggesting cirrhosis. No liver masses noted. The gallbladder is mildly distended but otherwise normal. Right upper quadrant ultrasound from today demonstrated no gallbladder abnormalities. Pancreas: Unremarkable. No pancreatic ductal dilatation or surrounding inflammatory changes. Spleen: Normal in size without focal abnormality. Adrenals/Urinary Tract: The adrenal glands are normal. There appear to be 2 right renal cysts. I suspect a small cyst in the left kidney as well. An exophytic mass off the left kidney on series 2, image 38 demonstrates an attenuation of 33 Hounsfield units and this mass measures 10 mm. No hydronephrosis. A few small stones are seen in the right kidney. No ureterectasis or ureteral stone. The bladder is unremarkable. Stomach/Bowel: The stomach and small bowel is normal with no obstruction. The colon is unremarkable. Visualized portions of the appendix are  normal. Vascular/Lymphatic: Atherosclerotic changes are seen in the nonaneurysmal aorta. Shotty nodes are seen in the retroperitoneum and gastrohepatic ligament. No gross adenopathy. Reproductive: The left ovary is somewhat enlarged measuring 6.5 cm. A rounded region of decreased echogenicity within the ovary demonstrates an attenuation of 40 Hounsfield units. The right ovary is normal in appearance. The uterus is unremarkable. Other: Free air seen in the abdomen with no identified cause. There is ascites as well, particularly adjacent to the liver and spleen, in the pericolic gutters, and in the pelvis. Musculoskeletal: No acute or significant osseous findings. IMPRESSION: 1. Free air in the abdomen suggests perforated viscus. An underlying cause is not seen. 2. Moderate ascites in the abdomen and pelvis. 3. Enlarged left ovary containing a 5 cm rounded hypoechoic region. The hypoechoic region could represent a hemorrhagic cyst but is nonspecific. Recommend pelvic ultrasound for further evaluation of the left ovary. 4. Cirrhotic liver. 5. 10 mm exophytic mass off the left kidney with an attenuation of 33 Hounsfield units. This could represent a solid mass or hyperdense cyst. An ultrasound may further characterize when the patient is able. 6. Nonobstructive renal stones. 7. Atherosclerotic changes in the nonaneurysmal aorta. Findings called to Dr. Ashok Cordia. Electronically Signed   By: Dorise Bullion III M.D   On: 12/14/2017 20:50   Dg Chest 1 View  Result Date: 12/02/2017 CLINICAL DATA:  Evaluate NG tube placement EXAM: CHEST  1 VIEW COMPARISON:  November 30, 2017 FINDINGS: The distal NG tube is not well seen due to poor penetration. However, it appears to terminate below today's study, within the abdomen. No other change. IMPRESSION: The distal NG tube is not well seen due to poor penetration. However, it appears to terminate in the abdomen, below the bottom of today's study. Electronically Signed   By: Dorise Bullion III M.D   On: 12/22/2017 23:25   Dg Chest 2 View  Result Date: 12/15/2017 CLINICAL DATA:  Shortness of breath, RIGHT upper abdominal pain, RIGHT shoulder pain, awoke patient from sleep, history CHF, essential benign hypertension, atrial fibrillation, pulmonary hypertension EXAM: CHEST - 2 VIEW COMPARISON:  04/04/2017 FINDINGS: Enlargement of cardiac silhouette. Mediastinal contours and pulmonary vascularity normal. Mild RIGHT basilar atelectasis. Chronic accentuation of pulmonary markings, stable. No acute infiltrate, pleural effusion or pneumothorax. Bones unremarkable. IMPRESSION: Enlargement of cardiac silhouette with RIGHT basilar atelectasis. Electronically Signed   By: Lavonia Dana M.D.   On: 12/09/2017 17:31   Dg Chest Port 1  View  Result Date: 12/01/2017 CLINICAL DATA:  Post nasogastric tube placement EXAM: PORTABLE CHEST 1 VIEW COMPARISON:  Portable exam 1502 hours compared to 12/13/2017 FINDINGS: Nasogastric tube extends into stomach, tip not visualized. RIGHT arm PICC line tip projects over SVC. Enlargement of cardiac silhouette. Slight pulmonary vascular congestion. No gross acute infiltrate, pleural effusion or pneumothorax. IMPRESSION: Enlargement of cardiac silhouette. No acute abnormalities. Electronically Signed   By: Lavonia Dana M.D.   On: 12/01/2017 15:18   US Abdomen Limited Ruq  Result Date: 12/15/2017 CLINICAL DATA:  Right upper quadrant abdominal pain. Chronic kidney disease and hypertension. EXAM: ULTRASOUND ABDOMEN LIMITED RIGHT UPPER QUADRANT COMPARISON:  Renal ultrasound dated 06/30/2016 and abdomen and pelvis CT dated 01/02/2012 FINDINGS: Gallbladder: No gallstones or wall thickening visualized. No sonographic Murphy sign noted by sonographer. Common bile duct: Diameter: 4.1 mm Liver: Mildly irregular borders. Normal echotexture. Portal vein is patent on color Doppler imaging with normal direction of blood flow towards the liver. Also noted is a markedly echogenic  right kidney with progressive increased echogenicity, containing a small cyst. IMPRESSION: 1. No acute abnormality. 2. Markedly echogenic right kidney with progression, compatible with medical renal disease. Electronically Signed   By: Claudie Revering M.D.   On: 11/26/2017 10:27    Review of Systems  Constitutional: Negative for chills and fever.  Respiratory: Negative for shortness of breath.   Cardiovascular: Positive for leg swelling. Negative for orthopnea and PND.  Gastrointestinal: Positive for abdominal pain. Negative for nausea and vomiting.   Blood pressure 127/79, pulse 90, temperature 98.1 F (36.7 C), temperature source Axillary, resp. rate 17, height 5' 11"  (1.803 m), weight 121.5 kg, last menstrual period 11/10/2017, SpO2 100 %. Physical Exam  Constitutional: No distress.  Eyes: Left eye exhibits no discharge.  Neck: No JVD present.  Cardiovascular:  No murmur heard. Irregular rate and rhythm  Respiratory: No respiratory distress. She has no wheezes. She has rales.  GI: There is tenderness. There is no rebound and no guarding.  Musculoskeletal: She exhibits edema.  Neurological: She is alert.    Assessment/Plan: 1] renal failure: Possibly acute on chronic.  Etiology for her acute renal failure could be secondary to prerenal/ATN.  Her creatinine has increased above her baseline.  Patient however does not have any nausea or vomiting. 2] chronic renal failure: Baseline creatinine is about 2.5-2.7.  Stage IV.  Etiology was thought to be secondary to hypertension/cardiorenal/recurrent acute kidney injury/obesity related glomerulopathy. 3] possible gastric/intestinal perforation.  Presently patient seems to be feeling better.  Patient is being followed by surgery. 4] hypertension: Her blood pressure is reasonably controlled 5] liver cirrhosis 6] low CO2: Possibly metabolic acidosis but superimposed respiratory alkalosis cannot be ruled out.  ABG is pending 7] history of diastolic  dysfunction: Presently patient does not seem to have significant sign of fluid overload except some mild edema and cardiomegaly.  Patient denies any difficulty breathing. 8] anemia: Possibly to chronic renal failure 9] history of left kidney mass.  Incidental finding. Plan: We will start patient on D5 normal saline at 75 cc/h 2] we will use some diuretics to improve her urine output 3] we will check a renal panel in the morning 4] we will do ultrasound of the kidneys once patient is stable 5] we will do renal panel and CBC in the morning. 6] I agree with ABG and iron studies.  [name] S 12/02/2017, 9:16 AM

## 2017-12-03 ENCOUNTER — Inpatient Hospital Stay (HOSPITAL_COMMUNITY): Payer: Medicaid Other

## 2017-12-03 DIAGNOSIS — N179 Acute kidney failure, unspecified: Secondary | ICD-10-CM

## 2017-12-03 DIAGNOSIS — N189 Chronic kidney disease, unspecified: Secondary | ICD-10-CM

## 2017-12-03 DIAGNOSIS — I5030 Unspecified diastolic (congestive) heart failure: Secondary | ICD-10-CM

## 2017-12-03 DIAGNOSIS — N184 Chronic kidney disease, stage 4 (severe): Secondary | ICD-10-CM

## 2017-12-03 LAB — RENAL FUNCTION PANEL
ALBUMIN: 2 g/dL — AB (ref 3.5–5.0)
ANION GAP: 10 (ref 5–15)
BUN: 72 mg/dL — ABNORMAL HIGH (ref 6–20)
CO2: 11 mmol/L — ABNORMAL LOW (ref 22–32)
Calcium: 8.3 mg/dL — ABNORMAL LOW (ref 8.9–10.3)
Chloride: 115 mmol/L — ABNORMAL HIGH (ref 98–111)
Creatinine, Ser: 5.23 mg/dL — ABNORMAL HIGH (ref 0.44–1.00)
GFR calc non Af Amer: 9 mL/min — ABNORMAL LOW (ref >60)
GFR, EST AFRICAN AMERICAN: 11 mL/min — AB (ref >60)
GLUCOSE: 94 mg/dL (ref 70–99)
PHOSPHORUS: 6.4 mg/dL — AB (ref 2.5–4.6)
Potassium: 5.1 mmol/L (ref 3.5–5.1)
Sodium: 136 mmol/L (ref 135–145)

## 2017-12-03 LAB — CBC
HEMATOCRIT: 28.1 % — AB (ref 36.0–46.0)
HEMOGLOBIN: 8.8 g/dL — AB (ref 12.0–15.0)
MCH: 28.7 pg (ref 26.0–34.0)
MCHC: 31.3 g/dL (ref 30.0–36.0)
MCV: 91.5 fL (ref 80.0–100.0)
Platelets: 269 10*3/uL (ref 150–400)
RBC: 3.07 MIL/uL — ABNORMAL LOW (ref 3.87–5.11)
RDW: 15.9 % — ABNORMAL HIGH (ref 11.5–15.5)
WBC: 4.7 10*3/uL (ref 4.0–10.5)

## 2017-12-03 LAB — ECHOCARDIOGRAM COMPLETE
HEIGHTINCHES: 71 in
Weight: 4366.87 oz

## 2017-12-03 MED ORDER — ALTEPLASE 2 MG IJ SOLR
2.0000 mg | Freq: Once | INTRAMUSCULAR | Status: DC
  Filled 2017-12-03: qty 2

## 2017-12-03 MED ORDER — FUROSEMIDE 10 MG/ML IJ SOLN
8.0000 mg/h | INTRAVENOUS | Status: DC
  Administered 2017-12-03 (×2): 8 mg/h via INTRAVENOUS
  Filled 2017-12-03 (×2): qty 25

## 2017-12-03 MED ORDER — STERILE WATER FOR INJECTION IV SOLN
INTRAVENOUS | Status: DC
  Administered 2017-12-03 – 2017-12-04 (×3): via INTRAVENOUS
  Filled 2017-12-03 (×7): qty 9.71

## 2017-12-03 MED ORDER — IPRATROPIUM-ALBUTEROL 0.5-2.5 (3) MG/3ML IN SOLN
3.0000 mL | Freq: Four times a day (QID) | RESPIRATORY_TRACT | Status: DC | PRN
  Administered 2017-12-03 – 2017-12-06 (×3): 3 mL via RESPIRATORY_TRACT
  Filled 2017-12-03 (×3): qty 3

## 2017-12-03 NOTE — Progress Notes (Signed)
Subjective: Drowsy, but arowsable. No abdominal pain.  Objective: Vital signs in last 24 hours: Temp:  [97.5 F (36.4 C)-98.6 F (37 C)] 97.5 F (36.4 C) (10/08 0545) Pulse Rate:  [64-99] 86 (10/08 0700) Resp:  [17-27] 19 (10/08 0700) BP: (84-145)/(65-113) 120/89 (10/08 0700) SpO2:  [96 %-100 %] 100 % (10/08 0700) Weight:  [123.8 kg] 123.8 kg (10/08 0545) Last BM Date: 11/29/17  Intake/Output from previous day: 10/07 0701 - 10/08 0700 In: 2144.3 [I.V.:1529.2; IV Piggyback:615.2] Out: 200 [Urine:200] Intake/Output this shift: No intake/output data recorded.  General appearance: fatigued and no distress GI: Soft, nontender, nondistended.  No rigidity, minimal bs.  Lab Results:  Recent Labs    12/02/17 0413 12/02/17 0824 12/03/17 0406  WBC 6.7  --  4.7  HGB 7.6* 9.1* 8.8*  HCT 24.3* 29.3* 28.1*  PLT 270  --  269   BMET Recent Labs    12/02/17 0413 12/03/17 0406  NA 137 136  K 3.7 5.1  CL 119* 115*  CO2 12* 11*  GLUCOSE 99 94  BUN 51* 72*  CREATININE 3.54* 5.23*  CALCIUM 6.5* 8.3*   PT/INR Recent Labs    12/16/2017 2201  LABPROT 17.8*  INR 1.48    Studies/Results: Dg Chest Port 1 View  Result Date: 12/02/2017 CLINICAL DATA:  Evaluate NG tube placement EXAM: PORTABLE CHEST 1 VIEW COMPARISON:  Portable chest x-ray of 12/01/2017 FINDINGS: The NG tube does course below the hemidiaphragm into the stomach but the tip is not visualized. No active infiltrate or effusion is seen. The heart is mildly enlarged and stable. Right PICC line tip is noted to the upper SVC IMPRESSION: 1. NG tube tip extends into the stomach but the tip is not visualized. 2. Right PICC line tip overlies the upper SVC. Electronically Signed   By: Ivar Drape M.D.   On: 12/02/2017 17:18   Dg Chest Port 1 View  Result Date: 12/01/2017 CLINICAL DATA:  Post nasogastric tube placement EXAM: PORTABLE CHEST 1 VIEW COMPARISON:  Portable exam 1502 hours compared to 11/29/2017 FINDINGS: Nasogastric  tube extends into stomach, tip not visualized. RIGHT arm PICC line tip projects over SVC. Enlargement of cardiac silhouette. Slight pulmonary vascular congestion. No gross acute infiltrate, pleural effusion or pneumothorax. IMPRESSION: Enlargement of cardiac silhouette. No acute abnormalities. Electronically Signed   By: Lavonia Dana M.D.   On: 12/01/2017 15:18    Anti-infectives: Anti-infectives (From admission, onward)   Start     Dose/Rate Route Frequency Ordered Stop   12/02/17 2100  ceFEPIme (MAXIPIME) 1 g in sodium chloride 0.9 % 100 mL IVPB     1 g 200 mL/hr over 30 Minutes Intravenous Every 24 hours 12/02/17 0803     12/01/17 2100  ceFEPIme (MAXIPIME) 2 g in sodium chloride 0.9 % 100 mL IVPB  Status:  Discontinued     2 g 200 mL/hr over 30 Minutes Intravenous Every 24 hours 12/23/2017 2119 12/02/17 0803   12/24/2017 2115  ceFEPIme (MAXIPIME) 2 g in sodium chloride 0.9 % 100 mL IVPB     2 g 200 mL/hr over 30 Minutes Intravenous  Once 12/12/2017 2100 12/15/2017 2240   12/16/2017 2115  metroNIDAZOLE (FLAGYL) IVPB 500 mg     500 mg 100 mL/hr over 60 Minutes Intravenous Every 8 hours 12/18/2017 2100        Assessment/Plan: Imp:  Worsening kidney function.  No evidence of ongoing peritonitis.  NGT with no output.  Contrast study to assess perforation contraindicated due to  worsening renal failure.  Will pull NGT and start clear liquids.  Prognosis poor.  LOS: 3 days    Aviva Signs 12/03/2017

## 2017-12-03 NOTE — Progress Notes (Addendum)
PROGRESS NOTE    [name]  GXQ:[id number] DOB: January 06, 1971 DOA: 12/25/2017 PCP: Soyla Dryer, PA-C   Brief Narrative:  HPI On 12/13/2017 by Dr. Mitzi Hansen [name] is a 47 y.o. female with medical history significant for atrial fibrillation on Eliquis, chronic diastolic CHF, hypertension, CKD IV, and chronic anemia, now presenting to the emergency department for evaluation of acute upper abdominal pain.  Patient reports that she developed acute onset of pain in the right upper quadrant this morning with radiation towards the right shoulder.  Pain is rated as severe, constant, with no alleviating or exacerbating factors identified, and without vomiting, diarrhea, fevers, or chills.  She denies any chest pain or palpitations and denies any change in her chronic dyspnea.  She has never experienced these symptoms previously.  Denies melena or hematochezia.  Interim history Patient admitted for pneumoperitoneum. General surgery consulted, no surgery at this time. Plan treat medically. Today, patient now appears to have anemia, worsening creatinine, and metabolic acidosis. General surgery discussed patient's prognosis with her and family at bedside, patient understands that surgery is not an option at this time. She is now a DNR. Nephrology consulted for worsening renal failure. Palliative care consulted for [place].   Assessment & Plan   Abdominal pain secondary to Pneumoperitoneum -Patient presented with RUQ abdomina pain -CT abd/pelvis: free air in the abdomen suggests perforated viscus. Underlying cause not seen.  -Currently NPO, gentle IVF given history of CHF -NG tube was placed on admission however patient pulled it out -General surgery consulted and appreciated, recommended treat medically with antibiotics as she is a high risk surgical candidate given her comorbidities. She does not want surgery at this time and understands the risks with and without surgery. She does not want  to be intubated or have CPR and has opted for DNR.  -Currently on cefepime, flagyl -Discussed with Dr. Arnoldo Morale (surgery) today, will remove NG tube and place on clear liquid diet and monitor closely  Atrial fibrillation  -CHADSVASC 3 (gender, CHF,HTN) -Heart rate in the ED was up to 130s. Patient was placed on diltiazem drip.  -Will continue drip for now until further recommendations from surgery -Eliquis currenlty held   Chronic diastolic heart failure -BNP 328 (reviewed chart, this BNP is much lower than previous) -Suspect BNP elevated given CKD stage IV -Does not appear to be volume overloaded at this time -was placed on gentle IVF given acute illness  -continue to follow intake/output, daily weights -will obtain echocardiogram -currently on lasix drip  Acute kidney injury on Chronic kidney disease, Stage IV/ metabolic acidosis  -creatinine up to 5.23 -?ATN vs cardiorenal -Nephrology consulted and appreciated, plan for renal US -patient placed on lasix drip along with bicarb drip -Discussed with nephrology, Dr. Lowanda Foster, patient may need dialysis, however will try alsix drip and monitor  -Foley catheter placed for rentention and diuresis  -Continue to monitor BMP  Anemia of chronic kidney disease -hemoglobin currently 8.8 -Will continue to monitor CBC -anemia panel obtained: Iron 9, TIBC 5, Ferritin 62, folate 2.9 -FOBT pending -discussed IV iron with nephrology, hold off for now as to not volume overload patient   Essential hypertension -currently stable and cardizem drip  Chronic leg ulcers -continue wound care  Elevated troponin -No complaints of chest pain -Given that patient has CKD4, troponin not very reliable -trop flat 0.05-0.06-0.05  Hypokalemia -resolved with replacement -Continue to monitor BMP  Left renal mass/ Left adnexal lesion -incidentally noted on CT abd/pelvis -Patient will need  outpatient follow   Goals of care -obtained ABG for metabolic  acidosis- and patient is acidotic  -she does not wish for intubation or CPR and code status has been changed to DNR -Palliative care consulted for [place]  -Discussed with patient the current situation and she states understanding and wishes to have the NG tube removed.   DVT Prophylaxis  Was on Eliquis, currently held, SCDs  Code Status: DNR  Family Communication: Family at bedside  Disposition Plan: Admitted. Continue to monitor in ICU  Consultants General surgery Nephrology Palliative care  Procedures  None  Antibiotics   Anti-infectives (From admission, onward)   Start     Dose/Rate Route Frequency Ordered Stop   12/02/17 2100  ceFEPIme (MAXIPIME) 1 g in sodium chloride 0.9 % 100 mL IVPB     1 g 200 mL/hr over 30 Minutes Intravenous Every 24 hours 12/02/17 0803     12/01/17 2100  ceFEPIme (MAXIPIME) 2 g in sodium chloride 0.9 % 100 mL IVPB  Status:  Discontinued     2 g 200 mL/hr over 30 Minutes Intravenous Every 24 hours 12/17/2017 2119 12/02/17 0803   11/29/2017 2115  ceFEPIme (MAXIPIME) 2 g in sodium chloride 0.9 % 100 mL IVPB     2 g 200 mL/hr over 30 Minutes Intravenous  Once 12/01/2017 2100 12/03/2017 2240   12/22/2017 2115  metroNIDAZOLE (FLAGYL) IVPB 500 mg     500 mg 100 mL/hr over 60 Minutes Intravenous Every 8 hours 12/15/2017 2100        Subjective:   [name] seen and examined today.  Patient wants the NG tube removed. She denies shortness of breath, chest pain, abdominal pain, nausea, vomiting.   Objective:   Vitals:   12/03/17 0630 12/03/17 0645 12/03/17 0700 12/03/17 0806  BP: 128/88  120/89   Pulse: 85 86 86 81  Resp: 20 (!) 27 19 (!) 21  Temp:    97.8 F (36.6 C)  TempSrc:    Axillary  SpO2: 100% 98% 100% 100%  Weight:      Height:        Intake/Output Summary (Last 24 hours) at 12/03/2017 1022 Last data filed at 12/03/2017 0930 Gross per 24 hour  Intake 1693.08 ml  Output 200 ml  Net 1493.08 ml   Filed Weights   12/06/2017 2343 12/02/17 0500  12/03/17 0545  Weight: 121 kg 121.5 kg 123.8 kg   Exam  General: Well developed, chronically ill appearing, NAD, sleepy but arousable  HEENT: NCAT, mucous membranes moist.   Neck: Supple  Cardiovascular: S1 S2 auscultated, irregular   Respiratory: Coarse breath sounds, +crackles   Abdomen: Soft, nontender, nondistended, + few bowel sounds  Extremities: warm dry without cyanosis clubbing. LE edema with dressing in place B/L  Neuro: AAOx3, nonfocal  Psych: Normal affect and demeanor   Data Reviewed: I have personally reviewed following labs and imaging studies  CBC: Recent Labs  Lab 12/19/2017 0925 12/01/17 0545 12/02/17 0413 12/02/17 0824 12/03/17 0406  WBC 11.9* 14.6* 6.7  --  4.7  NEUTROABS  --  13.6*  --   --   --   HGB 8.4* 10.3* 7.6* 9.1* 8.8*  HCT 27.3* 33.7* 24.3* 29.3* 28.1*  MCV 95.8 96.3 94.2  --  91.5  PLT 346 323 270  --  295   Basic Metabolic Panel: Recent Labs  Lab 12/26/2017 0925 12/01/17 0545 12/02/17 0413 12/03/17 0406  NA 141 138 137 136  K 3.2* 4.4 3.7 5.1  CL 113* 115* 119* 115*  CO2 15* 11* 12* 11*  GLUCOSE 122* 71 99 94  BUN 41* 44* 51* 72*  CREATININE 2.66* 3.05* 3.54* 5.23*  CALCIUM 8.2* 8.0* 6.5* 8.3*  MG  --  1.8  --   --   PHOS  --   --   --  6.4*   GFR: Estimated Creatinine Clearance: 19.7 mL/min (A) (by C-G formula based on SCr of 5.23 mg/dL (H)). Liver Function Tests: Recent Labs  Lab 11/29/2017 0925 12/01/17 0545 12/03/17 0406  AST 10* 12*  --   ALT <5 6  --   ALKPHOS 65 43  --   BILITOT 1.1 1.2  --   PROT 8.4* 7.4  --   ALBUMIN 2.8* 2.4* 2.0*   Recent Labs  Lab 12/04/2017 0925  LIPASE 49   No results for input(s): AMMONIA in the last 168 hours. Coagulation Profile: Recent Labs  Lab 12/12/2017 2201  INR 1.48   Cardiac Enzymes: Recent Labs  Lab 12/19/2017 0925 12/21/2017 1442 12/01/17 0059 12/01/17 0545  TROPONINI 0.05* 0.05* 0.06* 0.05*   BNP (last 3 results) No results for input(s): PROBNP in the last 8760  hours. HbA1C: No results for input(s): HGBA1C in the last 72 hours. CBG: No results for input(s): GLUCAP in the last 168 hours. Lipid Profile: No results for input(s): CHOL, HDL, LDLCALC, TRIG, CHOLHDL, LDLDIRECT in the last 72 hours. Thyroid Function Tests: No results for input(s): TSH, T4TOTAL, FREET4, T3FREE, THYROIDAB in the last 72 hours. Anemia Panel: Recent Labs    12/02/17 0824  VITAMINB12 672  FOLATE 2.9*  FERRITIN 62  TIBC 171*  IRON 9*  RETICCTPCT 2.2   Urine analysis:    Component Value Date/Time   COLORURINE YELLOW 12/01/2017 1120   APPEARANCEUR CLEAR 12/11/2017 1120   LABSPEC 1.014 12/16/2017 1120   PHURINE 5.0 12/19/2017 1120   GLUCOSEU NEGATIVE 12/05/2017 1120   HGBUR NEGATIVE 12/06/2017 1120   [place] 12/05/2017 1120   KETONESUR NEGATIVE 12/07/2017 1120   PROTEINUR 30 (A) 12/20/2017 1120   UROBILINOGEN 0.2 12/07/2014 0014   NITRITE NEGATIVE 12/25/2017 1120   LEUKOCYTESUR NEGATIVE 12/26/2017 1120   Sepsis Labs: @LABRCNTIP (procalcitonin:4,lacticidven:4)  ) Recent Results (from the past 240 hour(s))  MRSA PCR Screening     Status: None   Collection Time: 12/12/2017 11:21 PM  Result Value Ref Range Status   MRSA by PCR NEGATIVE NEGATIVE Final    Comment:        The GeneXpert MRSA Assay (FDA approved for NASAL specimens only), is one component of a comprehensive MRSA colonization surveillance program. It is not intended to diagnose MRSA infection nor to guide or monitor treatment for MRSA infections. Performed at [facility], [address], [place], [place] [address]       Radiology Studies: Dg Chest [facility] 1 View  Result Date: 12/02/2017 CLINICAL DATA:  Evaluate NG tube placement EXAM: PORTABLE CHEST 1 VIEW COMPARISON:  Portable chest x-ray of 12/01/2017 FINDINGS: The NG tube does course below the hemidiaphragm into the stomach but the tip is not visualized. No active infiltrate or effusion is seen. The heart is mildly enlarged and  stable. Right PICC line tip is noted to the upper SVC IMPRESSION: 1. NG tube tip extends into the stomach but the tip is not visualized. 2. Right PICC line tip overlies the upper SVC. Electronically Signed   By: Ivar Drape M.D.   On: 12/02/2017 17:18   Dg Chest [facility] 1 View  Result  Date: 12/01/2017 CLINICAL DATA:  Post nasogastric tube placement EXAM: PORTABLE CHEST 1 VIEW COMPARISON:  Portable exam 1502 hours compared to 12/20/2017 FINDINGS: Nasogastric tube extends into stomach, tip not visualized. RIGHT arm PICC line tip projects over SVC. Enlargement of cardiac silhouette. Slight pulmonary vascular congestion. No gross acute infiltrate, pleural effusion or pneumothorax. IMPRESSION: Enlargement of cardiac silhouette. No acute abnormalities. Electronically Signed   By: Lavonia Dana M.D.   On: 12/01/2017 15:18     Scheduled Meds: . alteplase  2 mg Intracatheter Once  . ammonium lactate   Topical Q1200  . Chlorhexidine Gluconate Cloth  6 each Topical Daily  . pantoprazole  40 mg Intravenous Q12H  . sodium chloride flush  10-40 mL Intracatheter Q12H  . sodium chloride flush  3 mL Intravenous Q12H  . sodium hypochlorite   Irrigation Q1200   Continuous Infusions: . sodium chloride Stopped (12/02/17 0719)  . ceFEPime (MAXIPIME) IV 1 g (12/02/17 2057)  . dextrose 5 % and 0.9% NaCl 75 mL/hr at 12/03/17 0654  . diltiazem (CARDIZEM) infusion 7.5 mg/hr (12/03/17 1006)  . furosemide (LASIX) infusion 8 mg/hr (12/03/17 0956)  . metronidazole Stopped (12/03/17 0520)  .  sodium bicarbonate infusion 1/4 NS 1000 mL 125 mL/hr at 12/03/17 0954     LOS: 3 days   Time Spent in minutes   45 minutes (greater than 50% of time spent with patient face to face, as well as reviewing records, discussing patient with consultants, and formulating a plan)  Cristal Ford D.O. on 12/03/2017 at 10:22 AM  Between 7am to 7pm - Please see pager noted on [email or web address]  After 7pm go to [email or web address]  And look for the  night coverage person covering for me after hours  [facility] Office  [phone]

## 2017-12-03 NOTE — Progress Notes (Signed)
Patient alert, tolerated diet well. PICC line this morning was not flushing or infusing well. Changed caps which seemed to work temporarily. Dr. Ree Kida made aware. Two IV's put in by another RN and PICC dressing changed. PICC noted to be 2cm and not 1 cm in length exposed. Dr. Ree Kida also made aware of this. Once PICC dressing changed and repositioned, all ports flushing and drawing back blood well. IV's reconnected to PICC and infusing well. Total urine output since 7am was 140ml of amber colored urine. Dr. Ree Kida made aware. PRN duo nebs ordered. Patient denied any shortness of breath, chest pains or discomfort but lung sounds are rhonchi and coarse crackles throughout all lobes. O2 sat remains 100% on room air.

## 2017-12-03 NOTE — Progress Notes (Signed)
Subjective: Interval History: has no complaint of nausea or vomiting.  Patient also denies any difficulty breathing..  Objective: Vital signs in last 24 hours: Temp:  [97.5 F (36.4 C)-98.6 F (37 C)] 97.8 F (36.6 C) (10/08 0806) Pulse Rate:  [64-99] 81 (10/08 0806) Resp:  [17-27] 21 (10/08 0806) BP: (84-145)/(65-113) 120/89 (10/08 0700) SpO2:  [96 %-100 %] 100 % (10/08 0806) Weight:  [123.8 kg] 123.8 kg (10/08 0545) Weight change: 2.3 kg  Intake/Output from previous day: 10/07 0701 - 10/08 0700 In: 2144.3 [I.V.:1529.2; IV Piggyback:615.2] Out: 200 [Urine:200] Intake/Output this shift: No intake/output data recorded.  Generally: Patient looks somewhat sleepy but arousable and answer questions. Chest: She has bilateral inspiratory crackles. Heart exam regular rate and rhythm no murmur Extremities she has chronic venous stasis.  Lab Results: Recent Labs    12/02/17 0413 12/02/17 0824 12/03/17 0406  WBC 6.7  --  4.7  HGB 7.6* 9.1* 8.8*  HCT 24.3* 29.3* 28.1*  PLT 270  --  269   BMET:  Recent Labs    12/02/17 0413 12/03/17 0406  NA 137 136  K 3.7 5.1  CL 119* 115*  CO2 12* 11*  GLUCOSE 99 94  BUN 51* 72*  CREATININE 3.54* 5.23*  CALCIUM 6.5* 8.3*   No results for input(s): PTH in the last 72 hours. Iron Studies:  Recent Labs    12/02/17 0824  IRON 9*  TIBC 171*  FERRITIN 62    Studies/Results: Dg Chest Port 1 View  Result Date: 12/02/2017 CLINICAL DATA:  Evaluate NG tube placement EXAM: PORTABLE CHEST 1 VIEW COMPARISON:  Portable chest x-ray of 12/01/2017 FINDINGS: The NG tube does course below the hemidiaphragm into the stomach but the tip is not visualized. No active infiltrate or effusion is seen. The heart is mildly enlarged and stable. Right PICC line tip is noted to the upper SVC IMPRESSION: 1. NG tube tip extends into the stomach but the tip is not visualized. 2. Right PICC line tip overlies the upper SVC. Electronically Signed   By: Ivar Drape  M.D.   On: 12/02/2017 17:18   Dg Chest Port 1 View  Result Date: 12/01/2017 CLINICAL DATA:  Post nasogastric tube placement EXAM: PORTABLE CHEST 1 VIEW COMPARISON:  Portable exam 1502 hours compared to 12/20/2017 FINDINGS: Nasogastric tube extends into stomach, tip not visualized. RIGHT arm PICC line tip projects over SVC. Enlargement of cardiac silhouette. Slight pulmonary vascular congestion. No gross acute infiltrate, pleural effusion or pneumothorax. IMPRESSION: Enlargement of cardiac silhouette. No acute abnormalities. Electronically Signed   By: Lavonia Dana M.D.   On: 12/01/2017 15:18    I have reviewed the patient's current medications.  Assessment/Plan: 1] acute kidney injury superimposed on chronic.  Presently her renal function continued to decline.  This could be secondary to ATN/cardiorenal.  Patient however remains as symptomatic.  She is oliguric. 2] anemia: Her hemoglobin is low.  Her iron saturation is 5% and ferritin of 62.  Hence a combination of iron deficiency anemia and probably anemia of chronic disease. 3] low CO2: Metabolic acidosis.  Her CO2 is 11 remains low. 4] bone and mineral disorder: Her calcium is range but phosphorus is high. 5] fluid management: Presently she is on Lasix.  Her urine output still remains poor.  Patient denies any difficulty breathing but clinically seems to have sign of fluid overload.  Patient with severe cardiomyopathy. 6] hypertension: Her blood pressure is reasonably controlled  7] atrial fibrillation: Her heart rate is controlled.  Plan: We will use 1/4 saline with 100 mEq of sodium bicarb at 125 cc/hr 2] we will DC intermittent IV Lasix 3] we will start her on Lasix 250 mg and 250 cc of normal saline at 8 mg/h 4] we will do ultrasound of the kidneys 5] I have discussed with the patient and her son.  If her renal function continued to decline we may consider initiating dialysis possibly tomorrow.  LOS: 3 days   Logan [place]  S 12/03/2017,8:44 AM

## 2017-12-03 NOTE — Progress Notes (Signed)
Pharmacy Antibiotic Note  [name] is a 47 y.o. female admitted on 12/11/2017 with intra abdominal infection.  Pharmacy has been consulted for cefepime dosing.  Plan: Cefepime 1000 mg IV every 24 hours Flagyl 500 mg IV every 8 hours Monitor labs, c/s, and patient improvement.  Height: 5\' 11"  (180.3 cm) Weight: 272 lb 14.9 oz (123.8 kg) IBW/kg (Calculated) : 70.8  Temp (24hrs), Avg:97.9 F (36.6 C), Min:97.5 F (36.4 C), Max:98.6 F (37 C)  Recent Labs  Lab 12/03/2017 0925 12/01/17 0059 12/01/17 0544 12/01/17 0545 12/01/17 0822 12/02/17 0413 12/02/17 0414 12/03/17 0406  WBC 11.9*  --   --  14.6*  --  6.7  --  4.7  CREATININE 2.66*  --   --  3.05*  --  3.54*  --  5.23*  LATICACIDVEN  --  2.4* 2.5*  --  2.0*  --  1.1  --     Estimated Creatinine Clearance: 19.7 mL/min (A) (by C-G formula based on SCr of 5.23 mg/dL (H)).    Allergies  Allergen Reactions  . Penicillins Hives and Other (See Comments)    Childhood allergy. Has patient had a PCN reaction causing immediate rash, facial/tongue/throat swelling, SOB or lightheadedness with hypotension: NO Has patient had a PCN reaction causing severe rash involving mucus membranes or skin necrosis: No NO Has patient had a PCN reaction that required hospitalization No NO Has patient had a PCN reaction occurring within the last 10 years: NO If all of the above answers are "NO", then may pro    Antimicrobials this admission: 10/5 cefepime >>  10/5 flagyl >>   Microbiology results: 10/5 MRSA PCR: negative  Thank you for allowing pharmacy to be a part of this patient's care.  Ramond Craver 12/03/2017 11:15 AM

## 2017-12-03 NOTE — Progress Notes (Signed)
Palliative: Ms. Beverly, [name], is sitting up in bed.  She greets me making and keeping eye contact.  Initially, she looks much improved from yesterday.  Present today at bedside are sisters [name] and [name].  After 5 minutes of conversation, [name] is very sleepy and will only rarely open her eyes.    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  Both [name] and [name] endorse that they are available to get medications, provide transportation, although [name] shares that she bought [name] a car.  She shares that [name] is able to drive when she wants to.  When questioned about not going to last 2 heart failure appointments or wound care appointment, [name] is unable to tell us why she did not go.  We reviewed labs in detail including creatinine (trending shared), GFR, albumin (2.0 qualifies for hospice care), hemoglobin [name] would accept blood if needed).  We reviewed nephrology consult in detail.  We talked about hemodialysis, what it would look like.  I share that [name] would have to attend sessions 3 days a week for 4 hours at a time.  We also talked about what surgery would look like, although I explained that at this point, white blood cells are normal, she does not have a need for surgery.  We talked about the treatment plan in detail including IV antibiotics, IV Lasix, pain and anxiety medication usage (discussed due to somnolence, last pain/anxiety medication given 10/7 around 11 PM).  I share that we need 24 to 48 hours for outcomes, her body and God's will.  [name] does initially ask about transfer to [name] for service with the heart failure  specialist.  We talked about transfer requirements, [name] does not meet these at this time.  [name] states many times that [name] is not compliant with her treatment regimen.  We talked in detail about [name]'s hospitalization in October 2013.  She was diuresed 100 pounds, and spent an extended amount of time in acute inpatient rehab.  [name] tearfully shares that Dr. Haroldine [name] with heart failure in [name] shared that if/when she got sick again he was not sure that he could turn it around.  I share that things are looking different for [name] now.  The 5-year period between then and now has been hard not just physically, but likely emotionally and spiritually also.  We talked about how to make look choices for loved ones including 1) keeping them at the center of decision-making, not what we want for them to) are we doing something for her, or to her (can we change what is happening, we cannot change heart failure kidney disease, noncompliance) 3) what would the person she was 10 to 15 years ago say about where she is now.  At this point, [name] becomes tearful stating that [name] is not the person she was then.  We plan for family meeting 10/9 at 1300.  Conference with nursing staff related to goals of care, family meeting 10/9 at 1300.  Page from nursing staff stating family is requesting meeting with PMT, stating their desire is for comfort measures at this time. I enter room to find [name] sitting up in bed, [name] is at bedside.  I share that I was called to further discussions related to goals of care.  [name] tells me that [name] now wants to have surgery.  [name] tells me that she will do whatever needs to be done.  I share that at this point, her abdomen is not acute, we need to review labs, see what tomorrow brings.   Conference with surgeon, [name], who verifies that as [name]'s abdomen is no longer acute, white blood cells trending down, there is no need for surgery at this time.   General surgery to follow-up tomorrow.  Conference with hospitalist related to goals of care discussion, plan of care, conference with general surgeon.  I returned to ICU to share with [name] and her family that at this point, there is no need for surgery as she does not have an acute abdomen.  There is no family at bedside at this time, and [name] will briefly open her eyes, but go back to sleep.  Charge nurse updated.  PMT to follow-up tomorrow  95 minutes, extended time Quinn Axe, NP Palliative Medicine Team Team Phone # [phone]  Greater than 50% of this time was spent counseling and coordinating care related to the above assessment and plan.

## 2017-12-03 NOTE — Progress Notes (Signed)
*  PRELIMINARY RESULTS* Echocardiogram 2D Echocardiogram has been performed.  Leavy Cella 12/03/2017, 3:24 PM

## 2017-12-04 DIAGNOSIS — N184 Chronic kidney disease, stage 4 (severe): Secondary | ICD-10-CM

## 2017-12-04 DIAGNOSIS — R1011 Right upper quadrant pain: Secondary | ICD-10-CM

## 2017-12-04 DIAGNOSIS — I272 Pulmonary hypertension, unspecified: Secondary | ICD-10-CM

## 2017-12-04 DIAGNOSIS — N179 Acute kidney failure, unspecified: Secondary | ICD-10-CM

## 2017-12-04 LAB — CBC
HCT: 26.3 % — ABNORMAL LOW (ref 36.0–46.0)
Hemoglobin: 7.9 g/dL — ABNORMAL LOW (ref 12.0–15.0)
MCH: 27.7 pg (ref 26.0–34.0)
MCHC: 30 g/dL (ref 30.0–36.0)
MCV: 92.3 fL (ref 80.0–100.0)
PLATELETS: 199 10*3/uL (ref 150–400)
RBC: 2.85 MIL/uL — AB (ref 3.87–5.11)
RDW: 15.8 % — AB (ref 11.5–15.5)
WBC: 3.8 10*3/uL — ABNORMAL LOW (ref 4.0–10.5)
nRBC: 2.4 % — ABNORMAL HIGH (ref 0.0–0.2)

## 2017-12-04 LAB — RENAL FUNCTION PANEL
Albumin: 1.8 g/dL — ABNORMAL LOW (ref 3.5–5.0)
Anion gap: 11 (ref 5–15)
BUN: 80 mg/dL — AB (ref 6–20)
CHLORIDE: 108 mmol/L (ref 98–111)
CO2: 14 mmol/L — AB (ref 22–32)
Calcium: 8.1 mg/dL — ABNORMAL LOW (ref 8.9–10.3)
Creatinine, Ser: 5.77 mg/dL — ABNORMAL HIGH (ref 0.44–1.00)
GFR calc Af Amer: 9 mL/min — ABNORMAL LOW (ref >60)
GFR calc non Af Amer: 8 mL/min — ABNORMAL LOW (ref >60)
GLUCOSE: 102 mg/dL — AB (ref 70–99)
Phosphorus: 5.4 mg/dL — ABNORMAL HIGH (ref 2.5–4.6)
Potassium: 4.3 mmol/L (ref 3.5–5.1)
Sodium: 133 mmol/L — ABNORMAL LOW (ref 135–145)

## 2017-12-04 MED ORDER — LORAZEPAM 2 MG/ML IJ SOLN
1.0000 mg | INTRAMUSCULAR | Status: DC | PRN
  Administered 2017-12-05 – 2017-12-06 (×2): 1 mg via INTRAVENOUS
  Filled 2017-12-04 (×2): qty 1

## 2017-12-04 MED ORDER — FENTANYL CITRATE (PF) 100 MCG/2ML IJ SOLN
25.0000 ug | INTRAMUSCULAR | Status: DC | PRN
  Administered 2017-12-04 – 2017-12-06 (×8): 50 ug via INTRAVENOUS
  Filled 2017-12-04 (×8): qty 2

## 2017-12-04 MED ORDER — HYDRALAZINE HCL 20 MG/ML IJ SOLN: 10.0000 mg | INTRAMUSCULAR | Status: DC | PRN

## 2017-12-04 MED ORDER — BISACODYL 10 MG RE SUPP: 10.0000 mg | Freq: Every day | RECTAL | Status: DC | PRN

## 2017-12-04 MED ORDER — POLYVINYL ALCOHOL 1.4 % OP SOLN: 2.0000 [drp] | OPHTHALMIC | Status: DC | PRN

## 2017-12-04 NOTE — Progress Notes (Signed)
[name]  MRN: [id number]  DOB/AGE: 11/19/1970 47 y.o.  Primary Care Physician:McElroy, Carlis Stable  Admit date: 11/27/2017  Chief Complaint:  Chief Complaint  Patient presents with  . Abdominal Pain    S-Pt presented on  12/20/2017 with  Chief Complaint  Patient presents with  . Abdominal Pain  .    Pt is lethargic, unable to offer any complaints.   Meds . alteplase  2 mg Intracatheter Once  . ammonium lactate   Topical Q1200  . Chlorhexidine Gluconate Cloth  6 each Topical Daily  . pantoprazole  40 mg Intravenous Q12H  . sodium chloride flush  10-40 mL Intracatheter Q12H  . sodium chloride flush  3 mL Intravenous Q12H  . sodium hypochlorite   Irrigation Q1200       Physical Exam: Vital signs in last 24 hours: Temp:  [97.1 F (36.2 C)-97.8 F (36.6 C)] 97.5 F (36.4 C) (10/09 7253) Pulse Rate:  [67-96] 79 (10/09 0715) Resp:  [17-32] 21 (10/09 0715) BP: (105-152)/(65-97) 135/94 (10/09 0700) SpO2:  [96 %-100 %] 98 % (10/09 0715) Weight:  [126.1 kg] 126.1 kg (10/09 0400) Weight change: 2.3 kg Last BM Date: 11/29/17  Intake/Output from previous day: 10/08 0701 - 10/09 0700 In: 6644 [I.V.:1322.2; IV Piggyback:99.8] Out: 100 [Urine:100] Total I/O In: 2457.2 [I.V.:1346.4; IV Piggyback:1110.7] Out: -    Physical Exam: General- pt is lethargic. Resp- No acute REsp distress, Rhonchi CVS- S1S2 regular in rate and rhythm GIT- BS+, soft, NT, ND EXT- 2+ LE Edema, no Cyanosis   Lab Results: CBC Recent Labs    12/03/17 0406 12/04/17 0436  WBC 4.7 3.8*  HGB 8.8* 7.9*  HCT 28.1* 26.3*  PLT 269 199    BMET Recent Labs    12/03/17 0406 12/04/17 0436  NA 136 133*  K 5.1 4.3  CL 115* 108  CO2 11* 14*  GLUCOSE 94 102*  BUN 72* 80*  CREATININE 5.23* 5.77*  CALCIUM 8.3* 8.1*   Creat trend 2019  2.5--2.7=> 5.2=>5.7 2018  1.8--3.2 2017  1.9 2016  1.7--2.6 2014  1.4--1.9 2013  1.4--2.7 ( Prolonged admission in 2013)  MICRO Recent  Results (from the past 240 hour(s))  MRSA PCR Screening     Status: None   Collection Time: 12/17/2017 11:21 PM  Result Value Ref Range Status   MRSA by PCR NEGATIVE NEGATIVE Final    Comment:        The GeneXpert MRSA Assay (FDA approved for NASAL specimens only), is one component of a comprehensive MRSA colonization surveillance program. It is not intended to diagnose MRSA infection nor to guide or monitor treatment for MRSA infections. Performed at [facility], [address] [place], [place] [address]       Lab Results  Component Value Date   CALCIUM 8.1 (L) 12/04/2017   PHOS 5.4 (H) 12/04/2017               Impression: 1)Renal  AKI secondary to ATN/Cardiorenal                Oliguric ATN-Urine output less than 479ml                AKI on CKD               CKD stage 3/4.               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 Calcium when corrected for albumin at goal.  5)Sx-admitted with pneumoperitoneum now better Surgery and Primary MD following  6)Electrolytes  Normokalemic hyponatremic   7)Acid base Co2 not at goal On IV bicarb  8) Social-Pt is now DNR  Palliative care is also following  Pt and family are having meeting at 1pm.  Not sure yet if going for aggressive tx or hospice I discussed the kidney re;ated issues/modalities of renal replacement tx and answered their questions to best of my ability.    Plan:  I discussed the kidney related issues/modalities of renal replacement tx and answered their questions to best of my ability.  Will follow up again afternoon regarding decision. If pt and pt family in agreement-will start renal replacement tx.    Kolette Vey S 12/04/2017, 9:32 AM

## 2017-12-04 NOTE — Progress Notes (Signed)
Palliative: Ms. Gregg, Holster, is resting quietly in bed.  She does not respond to me in any meaningful way, she will briefly partially open one eye, but not make eye contact.  She is unable to communicate her basic needs.  Present today at bedside are sisters [name] and [place] along with boyfriend [name] (not a Marine scientist).  We talked in detail about encephalopathy, the 3 main causes including 1) infection 2) changes in chemistry, 3 nearing end of life.  I share that if we cannot change what is happening, I believe Ceaira is near end-of-life.  We talked in detail about Davon's worsening renal function.  We also talked in detail about her chronic health problems.  [name] asks when the medical team will let us know about dialysis.  I share that she and Joanie Coddington make the decision for Verta to have or not have dialysis.  I share a diagram of the chronic illness pathway, what is normal and expected.  I share that it would be expected for Aubria to go to rehab, and probably live the rest of her life in a skilled nursing facility. Joanie Coddington states that Aldean would not want that, and family does not want that for her.  Joanie Coddington and I talked with [name] about the difficulty in caring for Earlyne in her home.  I share that in order to be compliant with dialysis, medications, treatment plan, office visits, this takes a team.  [name] is tearful stating, "I do not want my sister to die".  Joanie Coddington asks, "if that were you, what would you want".  We talked in detail about how we would care for [facility] if we do not do dialysis.  Prognosis discussed with permission, I share that likely Shalonda would have days left.  I encourage [name] and Joanie Coddington to spend time together discussing how to best care for Dovray, I will return in approximately 30 minutes.  We also briefly discussed the option of residential hospice if Riot endures for longer than anticipated.  Conference with hospitalist related to plan of care, goals of care  discussion, HD.  Return to room for discussion with Luberta Robertson.  [name] is tearful, stating that their goal is full comfort care, no hemodialysis, no further blood draws, unburdening Donis from treatments and medications that are not changing what is happening.  Joanie Coddington states that she does not want to prolong her sister suffering.  Friends arrive, and [name] leads them from the room.  Kenney Houseman and I continue to discuss plans for Dawson.  Joanie Coddington tells me that she would like morphine given if Yalitza is struggling to breathe near end-of-life.  We talked about symptom management.  Joanie Coddington shares stories of loss including the death of her mother approximately 10 years ago, and her husband's passing approximately 2 years ago.  We plan for a telephone conference tomorrow afternoon.  No hemodialysis, FULL comfort care.  Hospitalist updated, orders written.  Nursing staff updated, requested to wean diltiazem to off.  95 minutes, extended time Quinn Axe, NP Palliative Medicine Team Team Phone # [phone]  Greater than 50% of this time was spent counseling and coordinating care related to the above assessment and plan.

## 2017-12-04 NOTE — Progress Notes (Signed)
Subjective: Patient somnolent and barely arousable this morning.  Objective: Vital signs in last 24 hours: Temp:  [97.1 F (36.2 C)-97.8 F (36.6 C)] 97.5 F (36.4 C) (10/09 0838) Pulse Rate:  [67-96] 79 (10/09 0715) Resp:  [17-32] 21 (10/09 0715) BP: (105-152)/(65-97) 135/94 (10/09 0700) SpO2:  [96 %-100 %] 98 % (10/09 0715) Weight:  [126.1 kg] 126.1 kg (10/09 0400) Last BM Date: 11/29/17  Intake/Output from previous day: 10/08 0701 - 10/09 0700 In: 3875 [I.V.:1322.2; IV Piggyback:99.8] Out: 100 [Urine:100] Intake/Output this shift: Total I/O In: 2457.2 [I.V.:1346.4; IV Piggyback:1110.7] Out: -  General: Somnolent GI: Soft, no tenderness elicited on examination.  No rigidity noted.  Lab Results:  Recent Labs    12/03/17 0406 12/04/17 0436  WBC 4.7 3.8*  HGB 8.8* 7.9*  HCT 28.1* 26.3*  PLT 269 199   BMET Recent Labs    12/03/17 0406 12/04/17 0436  NA 136 133*  K 5.1 4.3  CL 115* 108  CO2 11* 14*  GLUCOSE 94 102*  BUN 72* 80*  CREATININE 5.23* 5.77*  CALCIUM 8.3* 8.1*   PT/INR No results for input(s): LABPROT, INR in the last 72 hours.  Studies/Results: US Renal  Result Date: 12/03/2017 CLINICAL DATA:  Acute renal failure EXAM: RENAL / URINARY TRACT ULTRASOUND COMPLETE COMPARISON:  06/30/2016 ultrasound, 12/02/2017 FINDINGS: Right Kidney: Length: 10.6 cm. 1.5 cm cyst is noted. This is similar to that seen on prior CT examination. The smaller lower pole cyst is not as well appreciated on today's exam. No significant hydronephrosis is noted. Diffuse increased echogenicity is noted consistent with the given clinical history. Left Kidney: Length: 11 cm. Not well visualized aside from size. Diffuse increased echogenicity is noted. Bladder: Appears normal for degree of bladder distention. Note is made of ascites. This is similar to that seen on prior CT examination. IMPRESSION: Increased echogenicity consistent with medical renal disease. Stable right renal cyst  is noted. No other focal abnormality is seen. Electronically Signed   By: Inez Catalina M.D.   On: 12/03/2017 16:06   Dg Chest Port 1 View  Result Date: 12/02/2017 CLINICAL DATA:  Evaluate NG tube placement EXAM: PORTABLE CHEST 1 VIEW COMPARISON:  Portable chest x-ray of 12/01/2017 FINDINGS: The NG tube does course below the hemidiaphragm into the stomach but the tip is not visualized. No active infiltrate or effusion is seen. The heart is mildly enlarged and stable. Right PICC line tip is noted to the upper SVC IMPRESSION: 1. NG tube tip extends into the stomach but the tip is not visualized. 2. Right PICC line tip overlies the upper SVC. Electronically Signed   By: Ivar Drape M.D.   On: 12/02/2017 17:18    Anti-infectives: Anti-infectives (From admission, onward)   Start     Dose/Rate Route Frequency Ordered Stop   12/02/17 2100  ceFEPIme (MAXIPIME) 1 g in sodium chloride 0.9 % 100 mL IVPB     1 g 200 mL/hr over 30 Minutes Intravenous Every 24 hours 12/02/17 0803     12/01/17 2100  ceFEPIme (MAXIPIME) 2 g in sodium chloride 0.9 % 100 mL IVPB  Status:  Discontinued     2 g 200 mL/hr over 30 Minutes Intravenous Every 24 hours 12/22/2017 2119 12/02/17 0803   12/23/2017 2115  ceFEPIme (MAXIPIME) 2 g in sodium chloride 0.9 % 100 mL IVPB     2 g 200 mL/hr over 30 Minutes Intravenous  Once 12/21/2017 2100 12/01/2017 2240   11/29/2017 2115  metroNIDAZOLE (FLAGYL) IVPB  500 mg     500 mg 100 mL/hr over 60 Minutes Intravenous Every 8 hours 11/27/2017 2100        Assessment/Plan: Impression: Nursing reports patient tolerating diet well.  Renal failure worsening.  Still with normal white blood cell count.  Patient does not have an acute abdomen.  No need for surgical intervention at this time.  Palliative care is involved.  Will follow peripherally with you.  Prognosis is poor.  LOS: 4 days    Aviva Signs 12/04/2017

## 2017-12-04 NOTE — Progress Notes (Signed)
PROGRESS NOTE  [name] NGE:[id number] DOB: [birth year] DOA: 12/12/2017 PCP: Soyla Dryer, PA-C  Brief History:  HPI On 12/03/2017 by Dr. Michail Jewels Brownis a 47 y.o.femalewith medical history significant foratrial fibrillation on Eliquis, chronic diastolic CHF, hypertension, CKDIV,and chronic anemia, now presenting to the emergency department for evaluation of acute upper abdominal pain. Patient reports that she developed acute onset of pain in the right upper quadrant this morning with radiation towards the right shoulder. Pain is rated as severe, constant, with no alleviating or exacerbating factors identified, and without vomiting, diarrhea, fevers, or chills. She denies any chest pain or palpitations and denies any change in her chronic dyspnea. She has never experienced these symptoms previously. Denies melena or hematochezia  Interim history Patient admitted for pneumoperitoneum. General surgery consulted, no surgery at this time. Plan treat medically.  During the hospitalization, the patient developed worsening acute on chronic renal failure with metabolic acidosis.   General surgery discussed patient's prognosis with her and family at bedside, patient understands that surgery is not an option at this time. She is now a DNR. Nephrology consulted for worsening renal failure. Palliative care consulted for [place].   Assessment/Plan: Abdominal pain secondary to Pneumoperitoneum -Patient presented with RUQ abdomina pain -CT abd/pelvis: free air in the abdomen suggests perforated viscus. Underlying cause not seen.  -Currently NPO, gentle IVF given history of CHF -NG tube was placed on admission however patient pulled it out -General surgery consulted and appreciated, recommended treat medically with antibiotics as she is a high risk surgical candidate given her comorbidities.  -Initially did not want surgery at this time and understands the risks with and  without surgery.  -She does not want to be intubated or have CPR and has opted for DNR.  -Currently on cefepime, flagyl -continue clear liquids per general surgery  Persistent Atrial fibrillation  -CHADSVASC 3 (gender, CHF,HTN) -Heart rate in the ED was up to 130s. Patient was placed on diltiazem drip.  -Will continue drip for now until further recommendations from surgery and pt able to tolerate po reliably -Eliquis currenlty held   Chronic diastolic heart failure -BNP 328 (reviewed chart, this BNP is much lower than previous) -Suspect BNP elevated given CKD stage IV -Does not appear to be volume overloaded at this time -was initially placed on gentle IVF given acute illness  -continue to follow intake/output, daily weights -10/8 Echo--EF 65-70%, no WMA, PASP 48 -currently on lasix drip  Acute kidney injury on Chronic kidney disease, Stage IV/ metabolic acidosis  -creatinine up to 5.77 -baseline creatinine 2.5-1.9 -?ATN vs hemodynamic changes from pneumoperitoneum -Nephrology consulted and appreciated-->plan for RRT if family agrees -patient placed on lasix drip along with bicarb drip per renal -Foley catheter placed for rentention and diuresis  -Continue to monitor BMP -pt more somnolent today, in part due to uremia  Anemia of chronic kidney disease -baseline hemoglobin currently 8-9 -continue to monitor CBC -anemia panel obtained: Iron sat 5%, ferritin 62, folate 2.9 -FOBT pending -discussed IV iron with nephrology, hold off for now as to not volume overload patient   Essential hypertension -currently stable on cardizem drip  Chronic leg ulcers -continue wound care  Elevated troponin -No complaints of chest pain -Given that patient has CKD4, troponin not very reliable -trop flat 0.05-0.06-0.05 -12/03/17 Echo--EF 65-70%, no WMA, trivial TR, PASP 48  Left renal mass/ Left adnexal lesion -incidentally noted on CT abd/pelvis -Patient will need outpatient  follow   Goals of care -obtained ABG for metabolic acidosis- and patient is acidotic--7.25/28/68 -she does not wish for intubation or CPR and code status has been changed to DNR -Palliative care consulted for [place]  -Case discussed with Quinn Axe     Disposition Plan:   Not stable for d/c  Family Communication:   Sisters updated at [employer] time spent 35 minutes.  Greater than 50% spent face to face counseling and coordinating care.   Consultants:  Renal, general surgery  Code Status:  DNR  DVT Prophylaxis: Was on Eliquis, currently held, SCDs   Procedures: As Listed in Progress Note Above  Antibiotics: Cefepime 12/18/2017>>> Flagyl 10/5>>>    Subjective: Pt is somnolent but arousable.  Denies cp, sob, n/v/d.  C/o abd pain which is a little better.  No n/v/d/.  Denies f/c, headache  Objective: Vitals:   12/04/17 0715 12/04/17 0838 12/04/17 1154 12/04/17 1224  BP:    109/63  Pulse: 79     Resp: (!) 21     Temp:  (!) 97.5 F (36.4 C) 97.6 F (36.4 C)   TempSrc:  Axillary Oral   SpO2: 98%     Weight:      Height:        Intake/Output Summary (Last 24 hours) at 12/04/2017 1300 Last data filed at 12/04/2017 0724 Gross per 24 hour  Intake 3846.14 ml  Output 100 ml  Net 3746.14 ml   Weight change: 2.3 kg Exam:   General:  Pt is alert, follows commands appropriately, not in acute distress  HEENT: No icterus, No thrush, No neck mass, /AT  Cardiovascular: IRRR, S1/S2, no rubs, no gallops  Respiratory: bilateral rhonchi  Abdomen: Soft/+BS, non tender, non distended, no guarding  Extremities: 3 + LE edema, No lymphangitis, No petechiae, No rashes, no synovitis   Data Reviewed: I have personally reviewed following labs and imaging studies Basic Metabolic Panel: Recent Labs  Lab 12/10/2017 0925 12/01/17 0545 12/02/17 0413 12/03/17 0406 12/04/17 0436  NA 141 138 137 136 133*  K 3.2* 4.4 3.7 5.1 4.3  CL 113* 115* 119* 115* 108  CO2 15* 11* 12*  11* 14*  GLUCOSE 122* 71 99 94 102*  BUN 41* 44* 51* 72* 80*  CREATININE 2.66* 3.05* 3.54* 5.23* 5.77*  CALCIUM 8.2* 8.0* 6.5* 8.3* 8.1*  MG  --  1.8  --   --   --   PHOS  --   --   --  6.4* 5.4*   Liver Function Tests: Recent Labs  Lab 12/16/2017 0925 12/01/17 0545 12/03/17 0406 12/04/17 0436  AST 10* 12*  --   --   ALT <5 6  --   --   ALKPHOS 65 43  --   --   BILITOT 1.1 1.2  --   --   PROT 8.4* 7.4  --   --   ALBUMIN 2.8* 2.4* 2.0* 1.8*   Recent Labs  Lab 12/21/2017 0925  LIPASE 49   No results for input(s): AMMONIA in the last 168 hours. Coagulation Profile: Recent Labs  Lab 11/26/2017 2201  INR 1.48   CBC: Recent Labs  Lab 12/04/2017 0925 12/01/17 0545 12/02/17 0413 12/02/17 0824 12/03/17 0406 12/04/17 0436  WBC 11.9* 14.6* 6.7  --  4.7 3.8*  NEUTROABS  --  13.6*  --   --   --   --   HGB 8.4* 10.3* 7.6* 9.1* 8.8* 7.9*  HCT 27.3* 33.7* 24.3* 29.3* 28.1* 26.3*  MCV  95.8 96.3 94.2  --  91.5 92.3  PLT 346 323 270  --  269 199   Cardiac Enzymes: Recent Labs  Lab 12/01/2017 0925 12/23/2017 1442 12/01/17 0059 12/01/17 0545  TROPONINI 0.05* 0.05* 0.06* 0.05*   BNP: Invalid input(s): POCBNP CBG: No results for input(s): GLUCAP in the last 168 hours. HbA1C: No results for input(s): HGBA1C in the last 72 hours. Urine analysis:    Component Value Date/Time   COLORURINE YELLOW 12/04/2017 1120   APPEARANCEUR CLEAR 11/27/2017 1120   LABSPEC 1.014 11/27/2017 1120   PHURINE 5.0 12/05/2017 1120   GLUCOSEU NEGATIVE 12/05/2017 1120   HGBUR NEGATIVE 12/11/2017 1120   [place] 12/06/2017 1120   KETONESUR NEGATIVE 12/19/2017 1120   PROTEINUR 30 (A) 12/01/2017 1120   UROBILINOGEN 0.2 12/07/2014 0014   NITRITE NEGATIVE 12/21/2017 1120   LEUKOCYTESUR NEGATIVE 12/13/2017 1120   Sepsis Labs: @LABRCNTIP (procalcitonin:4,lacticidven:4) ) Recent Results (from the past 240 hour(s))  MRSA PCR Screening     Status: None   Collection Time: 12/07/2017 11:21 PM    Result Value Ref Range Status   MRSA by PCR NEGATIVE NEGATIVE Final    Comment:        The GeneXpert MRSA Assay (FDA approved for NASAL specimens only), is one component of a comprehensive MRSA colonization surveillance program. It is not intended to diagnose MRSA infection nor to guide or monitor treatment for MRSA infections. Performed at [facility], [address], [place], [place] [address]      Scheduled Meds: . alteplase  2 mg Intracatheter Once  . ammonium lactate   Topical Q1200  . Chlorhexidine Gluconate Cloth  6 each Topical Daily  . pantoprazole  40 mg Intravenous Q12H  . sodium chloride flush  10-40 mL Intracatheter Q12H  . sodium chloride flush  3 mL Intravenous Q12H   Continuous Infusions: . sodium chloride Stopped (12/02/17 0719)  . ceFEPime (MAXIPIME) IV Stopped (12/03/17 2049)  . dextrose 5 % and 0.9% NaCl Stopped (12/03/17 0952)  . diltiazem (CARDIZEM) infusion 7.5 mg/hr (12/04/17 1109)  . furosemide (LASIX) infusion 8 mg/hr (12/04/17 0724)  . metronidazole Stopped (12/04/17 0538)  .  sodium bicarbonate infusion 1/4 NS 1000 mL 125 mL/hr at 12/04/17 1287    Procedures/Studies: Ct Abdomen Pelvis Wo Contrast  Result Date: 12/01/2017 CLINICAL DATA:  Acute abdominal pain. EXAM: CT ABDOMEN AND PELVIS WITHOUT CONTRAST TECHNIQUE: Multidetector CT imaging of the abdomen and pelvis was performed following the standard protocol without IV contrast. COMPARISON:  CT scan January 02, 2012 FINDINGS: Lower chest: Small calcified granulomas in the lung bases. No other acute abnormalities in the lung bases. Cardiomegaly. Small pericardial effusion. Coronary artery calcifications. Hepatobiliary: The liver demonstrates a nodular contour suggesting cirrhosis. No liver masses noted. The gallbladder is mildly distended but otherwise normal. Right upper quadrant ultrasound from today demonstrated no gallbladder abnormalities. Pancreas: Unremarkable. No pancreatic ductal dilatation  or surrounding inflammatory changes. Spleen: Normal in size without focal abnormality. Adrenals/Urinary Tract: The adrenal glands are normal. There appear to be 2 right renal cysts. I suspect a small cyst in the left kidney as well. An exophytic mass off the left kidney on series 2, image 38 demonstrates an attenuation of 33 Hounsfield units and this mass measures 10 mm. No hydronephrosis. A few small stones are seen in the right kidney. No ureterectasis or ureteral stone. The bladder is unremarkable. Stomach/Bowel: The stomach and small bowel is normal with no obstruction. The colon is unremarkable. Visualized portions of the appendix are  normal. Vascular/Lymphatic: Atherosclerotic changes are seen in the nonaneurysmal aorta. Shotty nodes are seen in the retroperitoneum and gastrohepatic ligament. No gross adenopathy. Reproductive: The left ovary is somewhat enlarged measuring 6.5 cm. A rounded region of decreased echogenicity within the ovary demonstrates an attenuation of 40 Hounsfield units. The right ovary is normal in appearance. The uterus is unremarkable. Other: Free air seen in the abdomen with no identified cause. There is ascites as well, particularly adjacent to the liver and spleen, in the pericolic gutters, and in the pelvis. Musculoskeletal: No acute or significant osseous findings. IMPRESSION: 1. Free air in the abdomen suggests perforated viscus. An underlying cause is not seen. 2. Moderate ascites in the abdomen and pelvis. 3. Enlarged left ovary containing a 5 cm rounded hypoechoic region. The hypoechoic region could represent a hemorrhagic cyst but is nonspecific. Recommend pelvic ultrasound for further evaluation of the left ovary. 4. Cirrhotic liver. 5. 10 mm exophytic mass off the left kidney with an attenuation of 33 Hounsfield units. This could represent a solid mass or hyperdense cyst. An ultrasound may further characterize when the patient is able. 6. Nonobstructive renal stones. 7.  Atherosclerotic changes in the nonaneurysmal aorta. Findings called to Dr. Ashok Cordia. Electronically Signed   By: Dorise Bullion III M.D   On: 12/06/2017 20:50   Dg Chest 1 View  Result Date: 12/11/2017 CLINICAL DATA:  Evaluate NG tube placement EXAM: CHEST  1 VIEW COMPARISON:  November 30, 2017 FINDINGS: The distal NG tube is not well seen due to poor penetration. However, it appears to terminate below today's study, within the abdomen. No other change. IMPRESSION: The distal NG tube is not well seen due to poor penetration. However, it appears to terminate in the abdomen, below the bottom of today's study. Electronically Signed   By: Dorise Bullion III M.D   On: 12/14/2017 23:25   Dg Chest 2 View  Result Date: 12/24/2017 CLINICAL DATA:  Shortness of breath, RIGHT upper abdominal pain, RIGHT shoulder pain, awoke patient from sleep, history CHF, essential benign hypertension, atrial fibrillation, pulmonary hypertension EXAM: CHEST - 2 VIEW COMPARISON:  04/04/2017 FINDINGS: Enlargement of cardiac silhouette. Mediastinal contours and pulmonary vascularity normal. Mild RIGHT basilar atelectasis. Chronic accentuation of pulmonary markings, stable. No acute infiltrate, pleural effusion or pneumothorax. Bones unremarkable. IMPRESSION: Enlargement of cardiac silhouette with RIGHT basilar atelectasis. Electronically Signed   By: Lavonia Dana M.D.   On: 12/19/2017 17:31   US Renal  Result Date: 12/03/2017 CLINICAL DATA:  Acute renal failure EXAM: RENAL / URINARY TRACT ULTRASOUND COMPLETE COMPARISON:  06/30/2016 ultrasound, 12/18/2017 FINDINGS: Right Kidney: Length: 10.6 cm. 1.5 cm cyst is noted. This is similar to that seen on prior CT examination. The smaller lower pole cyst is not as well appreciated on today's exam. No significant hydronephrosis is noted. Diffuse increased echogenicity is noted consistent with the given clinical history. Left Kidney: Length: 11 cm. Not well visualized aside from size. Diffuse  increased echogenicity is noted. Bladder: Appears normal for degree of bladder distention. Note is made of ascites. This is similar to that seen on prior CT examination. IMPRESSION: Increased echogenicity consistent with medical renal disease. Stable right renal cyst is noted. No other focal abnormality is seen. Electronically Signed   By: Inez Catalina M.D.   On: 12/03/2017 16:06   Dg Chest Port 1 View  Result Date: 12/02/2017 CLINICAL DATA:  Evaluate NG tube placement EXAM: PORTABLE CHEST 1 VIEW COMPARISON:  Portable chest x-ray of 12/01/2017 FINDINGS: The NG  tube does course below the hemidiaphragm into the stomach but the tip is not visualized. No active infiltrate or effusion is seen. The heart is mildly enlarged and stable. Right PICC line tip is noted to the upper SVC IMPRESSION: 1. NG tube tip extends into the stomach but the tip is not visualized. 2. Right PICC line tip overlies the upper SVC. Electronically Signed   By: Ivar Drape M.D.   On: 12/02/2017 17:18   Dg Chest Port 1 View  Result Date: 12/01/2017 CLINICAL DATA:  Post nasogastric tube placement EXAM: PORTABLE CHEST 1 VIEW COMPARISON:  Portable exam 1502 hours compared to 12/24/2017 FINDINGS: Nasogastric tube extends into stomach, tip not visualized. RIGHT arm PICC line tip projects over SVC. Enlargement of cardiac silhouette. Slight pulmonary vascular congestion. No gross acute infiltrate, pleural effusion or pneumothorax. IMPRESSION: Enlargement of cardiac silhouette. No acute abnormalities. Electronically Signed   By: Lavonia Dana M.D.   On: 12/01/2017 15:18   US Abdomen Limited Ruq  Result Date: 12/19/2017 CLINICAL DATA:  Right upper quadrant abdominal pain. Chronic kidney disease and hypertension. EXAM: ULTRASOUND ABDOMEN LIMITED RIGHT UPPER QUADRANT COMPARISON:  Renal ultrasound dated 06/30/2016 and abdomen and pelvis CT dated 01/02/2012 FINDINGS: Gallbladder: No gallstones or wall thickening visualized. No sonographic Murphy sign  noted by sonographer. Common bile duct: Diameter: 4.1 mm Liver: Mildly irregular borders. Normal echotexture. Portal vein is patent on color Doppler imaging with normal direction of blood flow towards the liver. Also noted is a markedly echogenic right kidney with progressive increased echogenicity, containing a small cyst. IMPRESSION: 1. No acute abnormality. 2. Markedly echogenic right kidney with progression, compatible with medical renal disease. Electronically Signed   By: Claudie Revering M.D.   On: 12/05/2017 10:27    Orson Eva, DO  [facility] Pager [phone]  If 7PM-7AM, please contact night-coverage [email or web address] Password [email or web address] 12/04/2017, 1:00 PM   LOS: 4 days

## 2017-12-05 ENCOUNTER — Ambulatory Visit: Payer: Medicaid Other | Admitting: Physician Assistant

## 2017-12-05 LAB — BASIC METABOLIC PANEL
ANION GAP: 12 (ref 5–15)
BUN: 83 mg/dL — ABNORMAL HIGH (ref 6–20)
CO2: 14 mmol/L — AB (ref 22–32)
Calcium: 8.3 mg/dL — ABNORMAL LOW (ref 8.9–10.3)
Chloride: 106 mmol/L (ref 98–111)
Creatinine, Ser: 6.31 mg/dL — ABNORMAL HIGH (ref 0.44–1.00)
GFR calc Af Amer: 8 mL/min — ABNORMAL LOW (ref >60)
GFR calc non Af Amer: 7 mL/min — ABNORMAL LOW (ref >60)
GLUCOSE: 97 mg/dL (ref 70–99)
POTASSIUM: 4.6 mmol/L (ref 3.5–5.1)
Sodium: 132 mmol/L — ABNORMAL LOW (ref 135–145)

## 2017-12-05 LAB — CBC
HCT: 28.3 % — ABNORMAL LOW (ref 36.0–46.0)
HEMOGLOBIN: 8.5 g/dL — AB (ref 12.0–15.0)
MCH: 27.5 pg (ref 26.0–34.0)
MCHC: 30 g/dL (ref 30.0–36.0)
MCV: 91.6 fL (ref 80.0–100.0)
NRBC: 2.5 % — AB (ref 0.0–0.2)
Platelets: 171 10*3/uL (ref 150–400)
RBC: 3.09 MIL/uL — AB (ref 3.87–5.11)
RDW: 16 % — ABNORMAL HIGH (ref 11.5–15.5)
WBC: 6.7 10*3/uL (ref 4.0–10.5)

## 2017-12-05 MED ORDER — SODIUM CHLORIDE 0.9 % IV SOLN
1.0000 g | INTRAVENOUS | Status: DC
  Administered 2017-12-05 – 2017-12-06 (×2): 1 g via INTRAVENOUS
  Filled 2017-12-05 (×5): qty 1

## 2017-12-05 MED ORDER — PANTOPRAZOLE SODIUM 40 MG PO TBEC
40.0000 mg | DELAYED_RELEASE_TABLET | Freq: Every day | ORAL | Status: DC
  Administered 2017-12-05 – 2017-12-07 (×3): 40 mg via ORAL
  Filled 2017-12-05 (×4): qty 1

## 2017-12-05 MED ORDER — SODIUM BICARBONATE 8.4 % IV SOLN
INTRAVENOUS | Status: DC
  Administered 2017-12-05: 18:00:00 via INTRAVENOUS
  Filled 2017-12-05 (×7): qty 150

## 2017-12-05 MED ORDER — METRONIDAZOLE IN NACL 5-0.79 MG/ML-% IV SOLN
500.0000 mg | Freq: Three times a day (TID) | INTRAVENOUS | Status: DC
  Administered 2017-12-05 – 2017-12-07 (×6): 500 mg via INTRAVENOUS
  Filled 2017-12-05 (×5): qty 100

## 2017-12-05 MED ORDER — DAKINS (1/4 STRENGTH) 0.125 % EX SOLN
Freq: Every day | CUTANEOUS | Status: AC
  Administered 2017-12-05: 15:00:00
  Filled 2017-12-05: qty 473

## 2017-12-05 MED ORDER — DAKINS (1/4 STRENGTH) 0.125 % EX SOLN
Freq: Every day | CUTANEOUS | Status: AC
  Administered 2017-12-05 – 2017-12-07 (×3)
  Filled 2017-12-05: qty 473

## 2017-12-05 MED ORDER — FUROSEMIDE 10 MG/ML IJ SOLN
8.0000 mg/h | INTRAVENOUS | Status: DC
  Administered 2017-12-05 – 2017-12-07 (×2): 8 mg/h via INTRAVENOUS
  Filled 2017-12-05 (×4): qty 25

## 2017-12-05 NOTE — Progress Notes (Signed)
Palliative: Ms. [name], [place], is resting quietly in bed.  She is sleepy, but will open her eyes when asked, spoken to.  President today at bedside are sisters [name] and [place].  Also present is God Sister Sonia Baller, and family member [place].  [name] is oriented to person, place, time, and situation.  [name] tells me that she DOES NOW WANT hemodialysis.  I asked her what would happen if she did not get treatment for her kidneys, did not receive hemodialysis.  She initially tells me that she would get sicker, but when I questioned her again she tells me, "I won't be here".  When I asked her what hemodialysis will look like, [name] tells me that she understands she would have to go to hemodialysis 3 times a week without fail.  Family shares they are concerned with her compliance with cardiology and wound care appointments.  I share that hemodialysis does not change her other chronic health concerns including heart failure, pulmonary hypertension, chronic leg wound that is likely nonhealing, malnourishment with albumin of 2.0, history of bowel perforation, poor functional status.  We go further to talk about the concern for [name] tolerating dialysis with her heart problems such as A. fib.  I share that hospitalist will be informed, nephrology, also and general surgeon for temporary HD catheter placement.  I share that [name] will need rehab if she is able to tolerate dialysis.  I shared that she also may need to be a resident of SNF.  [name] is tearful, stating she does not want this, but will accept SNF stay.  [name] tearfully asks why she cannot move them with her sister.  I share that it takes a team to care for her, we cannot risk her sister's health/body in caring for her.  We talked about the benefits of around-the-clock care, meals provided, rehab, transportation.  I asked [name] about future goals.  She is unable to be specific stating that she just "wants to get better".  As we talked further she  states her goal is to be able to leave rehab and live with her sister.  There is talk about possible acute rehab, but I shared that remains to be seen.  Prognosis discussed with permission.  I share that, if she improves/tolerates dialysis, 6 to 12 months at best would not be surprising.  I also share that she could worsen and she may only have weeks.   We talked about how to care for [name] if/when she gets sick again, how to we care for her..  I share a diagram of the chronic illness pathway, what is normal and expected.  Talk again about how to make choices for loved ones including 1) keeping them at the center of decision-making 2) are we doing something for them or to them (can we change what is happening, we cannot change heart failure pulmonary hypertension, nonhealing leg wounds) 3) what would the [place] of 15 years ago tell them to do.   Sister [name] endorses the trial of dialysis, but [name] and family member [name] shared that they do not believe hemodialysis is a good choice.  Conference with hospitalist related to patient's desire to try hemodialysis. Conference with nursing staff related to plan of care. Conference with general surgery related to need for HD catheter placement.  65 minutes, extended time Quinn Axe, NP Palliative Medicine Team Team Phone # [phone]  Greater than 50% of this time was spent counseling and coordinating care related to the above assessment  and plan.

## 2017-12-05 NOTE — Progress Notes (Signed)
PROGRESS NOTE  [name] ZOX:[id number] DOB: 27-Dec-1970 DOA: 12/11/2017 PCP: Soyla Dryer, PA-C  Brief History:  HPI On 12/24/2017 by Dr. Michail Jewels Brownis a 47 y.o.femalewith medical history significant foratrial fibrillation on Eliquis, chronic diastolic CHF, hypertension, CKDIV,and chronic anemia, now presenting to the emergency department for evaluation of acute upper abdominal pain. Patient reports that she developed acute onset of pain in the right upper quadrant this morning with radiation towards the right shoulder. Pain is rated as severe, constant, with no alleviating or exacerbating factors identified, and without vomiting, diarrhea, fevers, or chills. She denies any chest pain or palpitations and denies any change in her chronic dyspnea. She has never experienced these symptoms previously. Denies melena or hematochezia  Interim history Patient admitted for pneumoperitoneum. General surgery consulted, no surgery at this time. Plan treat medically.  During the hospitalization, the patient developed worsening acute on chronic renal failure with metabolic acidosis.   General surgery discussed patient's prognosis with her and family at bedside, patient understands that surgery is not an option at this time. She is now a DNR. Nephrology consulted for worsening renal failure. Palliative care consulted for [place].  Initially, after family meetings, patient and family did not want HD and the patient was transitioned to comfort care.  24 hours later, the patient and family changed their minds and wished to pursue dialysis.  Nephrology and general surgery were reconsulted.  Assessment/Plan: Abdominal pain secondary to Pneumoperitoneum -Patient presented with RUQ abdomina pain -CT abd/pelvis: free air in the abdomen suggests perforated viscus. Underlying cause not seen.  -NG tube was placed on admission however patient pulled it out -General surgery  consulted and appreciated, recommended treat medically with antibiotics as she is a high risk surgical candidate given her comorbidities.  -Initially did not want surgery at this time and understands the risks with and without surgery.  -She does not want to be intubated or have CPR and has opted for DNR.  -cefepime, flagyl were discontinued when patient was transitioned to comfort care -12/05/17--pt and family changed their minds and wanted to pursue dialysis and continue abx -12/05/17--reconsult nephrology and general surgery -full liquids per general surgery  Persistent Atrial fibrillation  -CHADSVASC 3 (gender, CHF,HTN) -Heart rate in the ED was up to 130s. Patient was placed on diltiazem drip.  -Will continue drip for now until further recommendations from surgery and pt able to tolerate po reliably -Eliquis currenlty held   Chronic diastolic heart failure -BNP 328 (reviewed chart, this BNP is much lower than previous) -Suspect BNP elevated given CKD stage IV -Does not appear to be volume overloaded at this time -was initially placed on gentle IVF given acute illness  -continue to follow intake/output, daily weights -10/8 Echo--EF 65-70%, no WMA, PASP 48 -lasix drip stopped previously due transition to comfort care-->restart until HD initiated  Acute kidney injury on Chronic kidney disease, Stage IV/ metabolic acidosis  -creatinineup to 5.77 -baseline creatinine 2.5-1.9 -?ATN vs hemodynamic changes from pneumoperitoneum -Nephrology consulted and appreciated-->plan for RRT if family agrees -patient placed on lasix drip along with bicarb drip per renal -Foley catheter placed for rentention and diuresis -Continue to monitor BMP -pt more somnolent, in part due to uremia  Anemia of chronic kidney disease -baseline hemoglobin currently8-9 -continue to monitor CBC -anemia panel obtained: Iron sat 5%, ferritin 62, folate 2.9 -FOBT pending -discussed IV iron with nephrology,  hold off for now as to not volume  overload patient  Essential hypertension -previously stable on cardizem drip>>>po diltiazem as BP allows  Chronic leg ulcers -continue wound care  Elevated troponin -No complaints of chest pain -Given that patient has CKD4, troponin not very reliable -trop flat 0.05-0.06-0.05 -12/03/17 Echo--EF 65-70%, no WMA, trivial TR, PASP 48  Left renal mass/ Left adnexal lesion -incidentally noted on CT abd/pelvis -Patient will need outpatient follow   Goals of care -obtained ABG for metabolic acidosis- and patient is acidotic--7.25/28/68 -she does not wish for intubation or CPR and code status has been changed to DNR -Palliative care consulted for [place]  -Case discussed with Quinn Axe     Disposition Plan:   Not stable for d/c  Family Communication:   Sisters updated at bedside 10/10 --Total time spent 35 minutes.  Greater than 50% spent face to face counseling and coordinating care.   Consultants:  Renal, general surgery, palliative medicine  Code Status:  DNR  DVT Prophylaxis: Was on Eliquis, currently held, SCDs   Procedures: As Listed in Progress Note Above  Antibiotics: Cefepime 12/16/2017>>> Flagyl 10/5>>>      Subjective: Patient denies fevers, chills, headache, chest pain, dyspnea, nausea, vomiting, diarrhea, abdominal pain, dysuria, hematuria, hematochezia, and melena.    Objective: Vitals:   12/05/17 0700 12/05/17 0730 12/05/17 0800 12/05/17 0830  BP: 94/68 108/63 95/72 121/79  Pulse: (!) 101 97 70 81  Resp: (!) 21 (!) 22 (!) 22 (!) 24  Temp:      TempSrc:      SpO2: 100% 100% 98% 99%  Weight:      Height:        Intake/Output Summary (Last 24 hours) at 12/05/2017 1355 Last data filed at 12/05/2017 0900 Gross per 24 hour  Intake 1454.19 ml  Output -  Net 1454.19 ml   Weight change:  Exam:   General:  Pt is alert, follows commands appropriately, not in acute distress  HEENT: No icterus, No  thrush, No neck mass, [place]/AT  Cardiovascular: RRR, S1/S2, no rubs, no gallops  Respiratory: bilateral rales, no wheeze  Abdomen: Soft/+BS, non tender, non distended, no guarding  Extremities: 2 + LE edema, No lymphangitis, No petechiae, No rashes, no synovitis   Data Reviewed: I have personally reviewed following labs and imaging studies Basic Metabolic Panel: Recent Labs  Lab 12/05/2017 0925 12/01/17 0545 12/02/17 0413 12/03/17 0406 12/04/17 0436  NA 141 138 137 136 133*  K 3.2* 4.4 3.7 5.1 4.3  CL 113* 115* 119* 115* 108  CO2 15* 11* 12* 11* 14*  GLUCOSE 122* 71 99 94 102*  BUN 41* 44* 51* 72* 80*  CREATININE 2.66* 3.05* 3.54* 5.23* 5.77*  CALCIUM 8.2* 8.0* 6.5* 8.3* 8.1*  MG  --  1.8  --   --   --   PHOS  --   --   --  6.4* 5.4*   Liver Function Tests: Recent Labs  Lab 12/07/2017 0925 12/01/17 0545 12/03/17 0406 12/04/17 0436  AST 10* 12*  --   --   ALT <5 6  --   --   ALKPHOS 65 43  --   --   BILITOT 1.1 1.2  --   --   PROT 8.4* 7.4  --   --   ALBUMIN 2.8* 2.4* 2.0* 1.8*   Recent Labs  Lab 12/15/2017 0925  LIPASE 49   No results for input(s): AMMONIA in the last 168 hours. Coagulation Profile: Recent Labs  Lab 12/21/2017 2201  INR 1.48  CBC: Recent Labs  Lab 12/03/2017 0925 12/01/17 0545 12/02/17 0413 12/02/17 0824 12/03/17 0406 12/04/17 0436  WBC 11.9* 14.6* 6.7  --  4.7 3.8*  NEUTROABS  --  13.6*  --   --   --   --   HGB 8.4* 10.3* 7.6* 9.1* 8.8* 7.9*  HCT 27.3* 33.7* 24.3* 29.3* 28.1* 26.3*  MCV 95.8 96.3 94.2  --  91.5 92.3  PLT 346 323 270  --  269 199   Cardiac Enzymes: Recent Labs  Lab 12/12/2017 0925 12/18/2017 1442 12/01/17 0059 12/01/17 0545  TROPONINI 0.05* 0.05* 0.06* 0.05*   BNP: Invalid input(s): POCBNP CBG: No results for input(s): GLUCAP in the last 168 hours. HbA1C: No results for input(s): HGBA1C in the last 72 hours. Urine analysis:    Component Value Date/Time   COLORURINE YELLOW 12/10/2017 1120   APPEARANCEUR  CLEAR 12/24/2017 1120   LABSPEC 1.014 12/23/2017 1120   PHURINE 5.0 11/27/2017 1120   GLUCOSEU NEGATIVE 12/10/2017 1120   HGBUR NEGATIVE 12/09/2017 1120   [place] 12/07/2017 1120   KETONESUR NEGATIVE 12/24/2017 1120   PROTEINUR 30 (A) 12/02/2017 1120   UROBILINOGEN 0.2 12/07/2014 0014   NITRITE NEGATIVE 12/07/2017 1120   LEUKOCYTESUR NEGATIVE 11/29/2017 1120   Sepsis Labs: @LABRCNTIP (procalcitonin:4,lacticidven:4) ) Recent Results (from the past 240 hour(s))  MRSA PCR Screening     Status: None   Collection Time: 12/02/2017 11:21 PM  Result Value Ref Range Status   MRSA by PCR NEGATIVE NEGATIVE Final    Comment:        The GeneXpert MRSA Assay (FDA approved for NASAL specimens only), is one component of a comprehensive MRSA colonization surveillance program. It is not intended to diagnose MRSA infection nor to guide or monitor treatment for MRSA infections. Performed at [facility], [address], [place], [place] [address]      Scheduled Meds: . ammonium lactate   Topical Q1200  . sodium chloride flush  10-40 mL Intracatheter Q12H   Continuous Infusions: . sodium chloride Stopped (12/02/17 0719)    Procedures/Studies: Ct Abdomen Pelvis Wo Contrast  Result Date: 12/04/2017 CLINICAL DATA:  Acute abdominal pain. EXAM: CT ABDOMEN AND PELVIS WITHOUT CONTRAST TECHNIQUE: Multidetector CT imaging of the abdomen and pelvis was performed following the standard protocol without IV contrast. COMPARISON:  CT scan January 02, 2012 FINDINGS: Lower chest: Small calcified granulomas in the lung bases. No other acute abnormalities in the lung bases. Cardiomegaly. Small pericardial effusion. Coronary artery calcifications. Hepatobiliary: The liver demonstrates a nodular contour suggesting cirrhosis. No liver masses noted. The gallbladder is mildly distended but otherwise normal. Right upper quadrant ultrasound from today demonstrated no gallbladder abnormalities. Pancreas:  Unremarkable. No pancreatic ductal dilatation or surrounding inflammatory changes. Spleen: Normal in size without focal abnormality. Adrenals/Urinary Tract: The adrenal glands are normal. There appear to be 2 right renal cysts. I suspect a small cyst in the left kidney as well. An exophytic mass off the left kidney on series 2, image 38 demonstrates an attenuation of 33 Hounsfield units and this mass measures 10 mm. No hydronephrosis. A few small stones are seen in the right kidney. No ureterectasis or ureteral stone. The bladder is unremarkable. Stomach/Bowel: The stomach and small bowel is normal with no obstruction. The colon is unremarkable. Visualized portions of the appendix are normal. Vascular/Lymphatic: Atherosclerotic changes are seen in the nonaneurysmal aorta. Shotty nodes are seen in the retroperitoneum and gastrohepatic ligament. No gross adenopathy. Reproductive: The left ovary is somewhat enlarged measuring 6.5  cm. A rounded region of decreased echogenicity within the ovary demonstrates an attenuation of 40 Hounsfield units. The right ovary is normal in appearance. The uterus is unremarkable. Other: Free air seen in the abdomen with no identified cause. There is ascites as well, particularly adjacent to the liver and spleen, in the pericolic gutters, and in the pelvis. Musculoskeletal: No acute or significant osseous findings. IMPRESSION: 1. Free air in the abdomen suggests perforated viscus. An underlying cause is not seen. 2. Moderate ascites in the abdomen and pelvis. 3. Enlarged left ovary containing a 5 cm rounded hypoechoic region. The hypoechoic region could represent a hemorrhagic cyst but is nonspecific. Recommend pelvic ultrasound for further evaluation of the left ovary. 4. Cirrhotic liver. 5. 10 mm exophytic mass off the left kidney with an attenuation of 33 Hounsfield units. This could represent a solid mass or hyperdense cyst. An ultrasound may further characterize when the patient is  able. 6. Nonobstructive renal stones. 7. Atherosclerotic changes in the nonaneurysmal aorta. Findings called to Dr. Ashok Cordia. Electronically Signed   By: Dorise Bullion III M.D   On: 12/17/2017 20:50   Dg Chest 1 View  Result Date: 12/26/2017 CLINICAL DATA:  Evaluate NG tube placement EXAM: CHEST  1 VIEW COMPARISON:  November 30, 2017 FINDINGS: The distal NG tube is not well seen due to poor penetration. However, it appears to terminate below today's study, within the abdomen. No other change. IMPRESSION: The distal NG tube is not well seen due to poor penetration. However, it appears to terminate in the abdomen, below the bottom of today's study. Electronically Signed   By: Dorise Bullion III M.D   On: 12/01/2017 23:25   Dg Chest 2 View  Result Date: 12/05/2017 CLINICAL DATA:  Shortness of breath, RIGHT upper abdominal pain, RIGHT shoulder pain, awoke patient from sleep, history CHF, essential benign hypertension, atrial fibrillation, pulmonary hypertension EXAM: CHEST - 2 VIEW COMPARISON:  04/04/2017 FINDINGS: Enlargement of cardiac silhouette. Mediastinal contours and pulmonary vascularity normal. Mild RIGHT basilar atelectasis. Chronic accentuation of pulmonary markings, stable. No acute infiltrate, pleural effusion or pneumothorax. Bones unremarkable. IMPRESSION: Enlargement of cardiac silhouette with RIGHT basilar atelectasis. Electronically Signed   By: Lavonia Dana M.D.   On: 12/25/2017 17:31   US Renal  Result Date: 12/03/2017 CLINICAL DATA:  Acute renal failure EXAM: RENAL / URINARY TRACT ULTRASOUND COMPLETE COMPARISON:  06/30/2016 ultrasound, 12/25/2017 FINDINGS: Right Kidney: Length: 10.6 cm. 1.5 cm cyst is noted. This is similar to that seen on prior CT examination. The smaller lower pole cyst is not as well appreciated on today's exam. No significant hydronephrosis is noted. Diffuse increased echogenicity is noted consistent with the given clinical history. Left Kidney: Length: 11 cm. Not well  visualized aside from size. Diffuse increased echogenicity is noted. Bladder: Appears normal for degree of bladder distention. Note is made of ascites. This is similar to that seen on prior CT examination. IMPRESSION: Increased echogenicity consistent with medical renal disease. Stable right renal cyst is noted. No other focal abnormality is seen. Electronically Signed   By: Inez Catalina M.D.   On: 12/03/2017 16:06   Dg Chest Port 1 View  Result Date: 12/02/2017 CLINICAL DATA:  Evaluate NG tube placement EXAM: PORTABLE CHEST 1 VIEW COMPARISON:  Portable chest x-ray of 12/01/2017 FINDINGS: The NG tube does course below the hemidiaphragm into the stomach but the tip is not visualized. No active infiltrate or effusion is seen. The heart is mildly enlarged and stable. Right PICC line  tip is noted to the upper SVC IMPRESSION: 1. NG tube tip extends into the stomach but the tip is not visualized. 2. Right PICC line tip overlies the upper SVC. Electronically Signed   By: Ivar Drape M.D.   On: 12/02/2017 17:18   Dg Chest Port 1 View  Result Date: 12/01/2017 CLINICAL DATA:  Post nasogastric tube placement EXAM: PORTABLE CHEST 1 VIEW COMPARISON:  Portable exam 1502 hours compared to 12/12/2017 FINDINGS: Nasogastric tube extends into stomach, tip not visualized. RIGHT arm PICC line tip projects over SVC. Enlargement of cardiac silhouette. Slight pulmonary vascular congestion. No gross acute infiltrate, pleural effusion or pneumothorax. IMPRESSION: Enlargement of cardiac silhouette. No acute abnormalities. Electronically Signed   By: Lavonia Dana M.D.   On: 12/01/2017 15:18   US Abdomen Limited Ruq  Result Date: 12/04/2017 CLINICAL DATA:  Right upper quadrant abdominal pain. Chronic kidney disease and hypertension. EXAM: ULTRASOUND ABDOMEN LIMITED RIGHT UPPER QUADRANT COMPARISON:  Renal ultrasound dated 06/30/2016 and abdomen and pelvis CT dated 01/02/2012 FINDINGS: Gallbladder: No gallstones or wall thickening  visualized. No sonographic Murphy sign noted by sonographer. Common bile duct: Diameter: 4.1 mm Liver: Mildly irregular borders. Normal echotexture. Portal vein is patent on color Doppler imaging with normal direction of blood flow towards the liver. Also noted is a markedly echogenic right kidney with progressive increased echogenicity, containing a small cyst. IMPRESSION: 1. No acute abnormality. 2. Markedly echogenic right kidney with progression, compatible with medical renal disease. Electronically Signed   By: Claudie Revering M.D.   On: 12/18/2017 10:27    Orson Eva, DO  [facility] Pager [phone]  If 7PM-7AM, please contact night-coverage [email or web address] Password [email or web address] 12/05/2017, 1:55 PM   LOS: 5 days

## 2017-12-05 NOTE — Progress Notes (Signed)
Subjective: Interval History: Patient is very somnolent but arousable.  Presently offers no complaints.  Objective: Vital signs in last 24 hours: Temp:  [97.5 F (36.4 C)-99 F (37.2 C)] 99 F (37.2 C) (10/10 0500) Pulse Rate:  [80-106] 99 (10/10 0500) Resp:  [19-26] 20 (10/10 0500) BP: (87-123)/(53-71) 99/64 (10/10 0500) SpO2:  [98 %-100 %] 99 % (10/10 0500) Weight change:   Intake/Output from previous day: 10/09 0701 - 10/10 0700 In: 3551.3 [I.V.:2340.6; IV Piggyback:1210.7] Out: -  Intake/Output this shift: No intake/output data recorded.  Generally: As stated above patient very sleepy.  She is arousable and try to answer but sleeps back.  And is very difficult to get any additional information. Chest: She has bilateral inspiratory crackles. Heart exam regular rate and rhythm no murmur Extremities she has chronic venous stasis.  Lab Results: Recent Labs    12/03/17 0406 12/04/17 0436  WBC 4.7 3.8*  HGB 8.8* 7.9*  HCT 28.1* 26.3*  PLT 269 199   BMET:  Recent Labs    12/03/17 0406 12/04/17 0436  NA 136 133*  K 5.1 4.3  CL 115* 108  CO2 11* 14*  GLUCOSE 94 102*  BUN 72* 80*  CREATININE 5.23* 5.77*  CALCIUM 8.3* 8.1*   No results for input(s): PTH in the last 72 hours. Iron Studies:  Recent Labs    12/02/17 0824  IRON 9*  TIBC 171*  FERRITIN 62    Studies/Results: US Renal  Result Date: 12/03/2017 CLINICAL DATA:  Acute renal failure EXAM: RENAL / URINARY TRACT ULTRASOUND COMPLETE COMPARISON:  06/30/2016 ultrasound, 12/04/2017 FINDINGS: Right Kidney: Length: 10.6 cm. 1.5 cm cyst is noted. This is similar to that seen on prior CT examination. The smaller lower pole cyst is not as well appreciated on today's exam. No significant hydronephrosis is noted. Diffuse increased echogenicity is noted consistent with the given clinical history. Left Kidney: Length: 11 cm. Not well visualized aside from size. Diffuse increased echogenicity is noted. Bladder: Appears  normal for degree of bladder distention. Note is made of ascites. This is similar to that seen on prior CT examination. IMPRESSION: Increased echogenicity consistent with medical renal disease. Stable right renal cyst is noted. No other focal abnormality is seen. Electronically Signed   By: Inez Catalina M.D.   On: 12/03/2017 16:06    I have reviewed the patient's current medications.  Assessment/Plan:  acute kidney injury superimposed on chronic.  This could be secondary to ATN/cardiorenal.  Patient is presently anuric.  Her renal function continued to decline.  Patient is made comfort only and presently she and family does not want any aggressive treatment including hemodialysis.  Patient presently under palliative care.  Her IV Lasix and medication has been discontinued I will sign off and thank you for letting me participate in her care.  LOS: 5 days   Jawana Reagor S 12/05/2017,7:58 AM

## 2017-12-06 DIAGNOSIS — N184 Chronic kidney disease, stage 4 (severe): Secondary | ICD-10-CM

## 2017-12-06 DIAGNOSIS — N170 Acute kidney failure with tubular necrosis: Secondary | ICD-10-CM

## 2017-12-06 LAB — RENAL FUNCTION PANEL
ALBUMIN: 1.7 g/dL — AB (ref 3.5–5.0)
Anion gap: 13 (ref 5–15)
BUN: 89 mg/dL — AB (ref 6–20)
CO2: 15 mmol/L — ABNORMAL LOW (ref 22–32)
CREATININE: 6.48 mg/dL — AB (ref 0.44–1.00)
Calcium: 8.3 mg/dL — ABNORMAL LOW (ref 8.9–10.3)
Chloride: 104 mmol/L (ref 98–111)
GFR calc Af Amer: 8 mL/min — ABNORMAL LOW (ref >60)
GFR, EST NON AFRICAN AMERICAN: 7 mL/min — AB (ref >60)
Glucose, Bld: 104 mg/dL — ABNORMAL HIGH (ref 70–99)
Phosphorus: 5.1 mg/dL — ABNORMAL HIGH (ref 2.5–4.6)
Potassium: 4.5 mmol/L (ref 3.5–5.1)
Sodium: 132 mmol/L — ABNORMAL LOW (ref 135–145)

## 2017-12-06 LAB — CBC
HEMATOCRIT: 28.6 % — AB (ref 36.0–46.0)
Hemoglobin: 8.7 g/dL — ABNORMAL LOW (ref 12.0–15.0)
MCH: 27.6 pg (ref 26.0–34.0)
MCHC: 30.4 g/dL (ref 30.0–36.0)
MCV: 90.8 fL (ref 80.0–100.0)
Platelets: 192 10*3/uL (ref 150–400)
RBC: 3.15 MIL/uL — ABNORMAL LOW (ref 3.87–5.11)
RDW: 16.1 % — AB (ref 11.5–15.5)
WBC: 9.4 10*3/uL (ref 4.0–10.5)
nRBC: 1.6 % — ABNORMAL HIGH (ref 0.0–0.2)

## 2017-12-06 MED ORDER — HYDROCODONE-ACETAMINOPHEN 5-325 MG PO TABS
1.0000 | ORAL_TABLET | ORAL | Status: DC | PRN
  Administered 2017-12-06 – 2017-12-07 (×3): 1 via ORAL
  Filled 2017-12-06 (×3): qty 1

## 2017-12-06 MED ORDER — HEPARIN SODIUM (PORCINE) 1000 UNIT/ML DIALYSIS
1000.0000 [IU] | INTRAMUSCULAR | Status: DC | PRN
  Administered 2017-12-06: 3100 [IU] via INTRAVENOUS_CENTRAL
  Filled 2017-12-06 (×2): qty 1

## 2017-12-06 MED ORDER — SODIUM CHLORIDE 0.9 % IV SOLN: 100.0000 mL | INTRAVENOUS | Status: DC | PRN

## 2017-12-06 MED ORDER — ALTEPLASE 2 MG IJ SOLR: 2.0000 mg | Freq: Once | INTRAMUSCULAR | Status: DC | PRN

## 2017-12-06 MED ORDER — CHLORHEXIDINE GLUCONATE CLOTH 2 % EX PADS
6.0000 | MEDICATED_PAD | Freq: Every day | CUTANEOUS | Status: DC
  Administered 2017-12-06 – 2017-12-07 (×2): 6 via TOPICAL

## 2017-12-06 NOTE — NC FL2 (Signed)
[place] LEVEL OF CARE SCREENING TOOL     IDENTIFICATION  Patient Name: [name] Birthdate: 12/04/1970 Sex: female Admission Date (Current Location): 12/12/2017  [facility] and [place] Number:  [employer] and Address:  [place] [address], [place]      Provider Number: [id number]  Attending Physician Name and Address:  Orson Eva, MD  Relative Name and Phone Number:  Lauro Regulus (sister)  [phone]    Current Level of Care: Hospital Recommended Level of Care: [place] Prior Approval Number:    Date Approved/Denied:   PASRR Number:    Discharge Plan: SNF    Current Diagnoses: Patient Active Problem List   Diagnosis Date Noted  . Acute renal failure superimposed on stage 4 chronic kidney disease ([place]) 12/04/2017  . RUQ abdominal pain   . Renal failure, chronic, stage 4 (severe) (HCC)   . Renal failure (ARF), acute on chronic (HCC)   . Goals of care, counseling/discussion   . Palliative care by specialist   . DNR (do not resuscitate) discussion   . Bowel perforation ([place]) 12/12/2017  . Hypokalemia 12/09/2017  . Left renal mass 12/16/2017  . Left ovarian cyst 12/02/2017  . Elevated troponin 12/24/2017  . Chronic skin ulcer of lower leg ([place]) 12/16/2017  . Free intraperitoneal air   . Skin ulcer with fat layer exposed ([place])   . Cellulitis 10/23/2017  . CKD (chronic kidney disease), stage IV ([place]) 10/23/2017  . Persistent atrial fibrillation 10/23/2017  . Wound infection 10/23/2017  . Anemia in chronic kidney disease (CKD) 10/23/2017  . Gout 10/23/2017  . Acute pulmonary edema (HCC)   . CHF (congestive heart failure) ([place])   . Hypertensive emergency 06/26/2016  . Hypertensive urgency 06/26/2016  . Chronic diastolic heart failure ([place]) 02/06/2012  . HTN (hypertension) 02/06/2012  . Normocytic anemia 12/27/2011  . Pulmonary hypertension ([place]) 12/15/2011  . Cellulitis of left leg  12/15/2011  . Anasarca 12/10/2011  . Accelerated hypertension 12/10/2011  . Elevated serum creatinine 12/10/2011  . Hyperbilirubinemia 12/10/2011  . Gout attack 12/10/2011  . Atrial fibrillation with rapid ventricular response ([place]) 12/10/2011  . Morbid obesity ([place]) 12/10/2011  . Acquired trigger finger 12/12/2010  . LATERAL EPICONDYLITIS 06/06/2009  . BURSITIS, OLECRANON 12/10/2007    Orientation RESPIRATION BLADDER Height & Weight     Self, Time, Situation, Place  Other (Comment)(see dc summary) Continent Weight: 284 lb 2.8 oz (128.9 kg) Height:  5\' 11"  (180.3 cm)  BEHAVIORAL SYMPTOMS/MOOD NEUROLOGICAL BOWEL NUTRITION STATUS      Continent Diet(see dc summary)  AMBULATORY STATUS COMMUNICATION OF NEEDS Skin   Extensive Assist Verbally Normal                       Personal Care Assistance Level of Assistance  Bathing, Feeding, Dressing Bathing Assistance: Maximum assistance Feeding assistance: Limited assistance Dressing Assistance: Maximum assistance     Functional Limitations Info  Sight, Hearing, Speech Sight Info: Adequate Hearing Info: Adequate Speech Info: Adequate    SPECIAL CARE FACTORS FREQUENCY                       Contractures Contractures Info: Not present    Additional Factors Info  Code Status, Allergies Code Status Info: DNR Allergies Info: Penicillins           Current Medications (12/06/2017):  This is the current hospital active medication list Current Facility-Administered Medications  Medication Dose Route Frequency Provider Last Rate Last Dose  . 0.9 %  sodium chloride infusion   Intravenous PRN Cristal Ford, DO 10 mL/hr at 12/06/17 1000    . acetaminophen (TYLENOL) tablet 650 mg  650 mg Oral Q6H PRN Opyd, Ilene Qua, MD       Or  . acetaminophen (TYLENOL) suppository 650 mg  650 mg Rectal Q6H PRN Opyd, Ilene Qua, MD      . ammonium lactate (LAC-HYDRIN) 12 % lotion   Topical Q1200 Aviva Signs, MD      . bisacodyl  (DULCOLAX) suppository 10 mg  10 mg Rectal Daily PRN Dove, Tasha A, NP      . ceFEPIme (MAXIPIME) 1 g in sodium chloride 0.9 % 100 mL IVPB  1 g Intravenous Q24H Orson Eva, MD   Stopped at 12/05/17 1721  . Chlorhexidine Gluconate Cloth 2 % PADS 6 each  6 each Topical Q0600 Fran Lowes, MD   6 each at 12/06/17 0842  . fentaNYL (SUBLIMAZE) injection 25-50 mcg  25-50 mcg Intravenous Q1H PRN Dove, Tasha A, NP   50 mcg at 12/06/17 1053  . furosemide (LASIX) 250 mg in dextrose 5 % 250 mL (1 mg/mL) infusion  8 mg/hr Intravenous Continuous Tat, Shanon Brow, MD   Stopped at 12/05/17 2151  . hydrALAZINE (APRESOLINE) injection 10 mg  10 mg Intravenous Q4H PRN Dove, Tasha A, NP      . ipratropium-albuterol (DUONEB) 0.5-2.5 (3) MG/3ML nebulizer solution 3 mL  3 mL Nebulization Q6H PRN Cristal Ford, DO   3 mL at 12/05/17 1613  . LORazepam (ATIVAN) injection 1 mg  1 mg Intravenous Q4H PRN Dove, Tasha A, NP   1 mg at 12/06/17 0119  . metroNIDAZOLE (FLAGYL) IVPB 500 mg  500 mg Intravenous Franco Collet, MD   Stopped at 12/06/17 [phone]  . ondansetron (ZOFRAN) tablet 4 mg  4 mg Oral Q6H PRN Opyd, Ilene Qua, MD       Or  . ondansetron (ZOFRAN) injection 4 mg  4 mg Intravenous Q6H PRN Opyd, Ilene Qua, MD      . pantoprazole (PROTONIX) EC tablet 40 mg  40 mg Oral Daily Tat, David, MD   40 mg at 12/06/17 1040  . polyvinyl alcohol (LIQUIFILM TEARS) 1.4 % ophthalmic solution 2 drop  2 drop Both Eyes PRN Dove, Tasha A, NP      . sodium chloride flush (NS) 0.9 % injection 10-40 mL  10-40 mL Intracatheter Q12H Mikhail, [place], DO   10 mL at 12/06/17 0817  . sodium hypochlorite (DAKIN'S 1/4 STRENGTH) topical solution   Irrigation Daily Tat, David, MD         Discharge Medications: Please see discharge summary for a list of discharge medications.  Relevant Imaging Results:  Relevant Lab Results:   Additional Information SSN: [place], LCSW

## 2017-12-06 NOTE — Progress Notes (Signed)
Pharmacy Antibiotic Note  Today is day #7 of cefepime and metronidazole therapy for this 51 yof with abdominal pain due to pneumoperitoneum.  Plan is to treat medically, since patient is a poor surgical candidate. Patient has  ESRD and  femoral line placed today so that patient can begin HD.  She is currently afebrile.   Plan: Continue cefepime 1g IV q24h Continue Flagyl 500 mg IV q8h Monitor labs, c/s, and patientve improvement.  Height: 5\' 11"  (180.3 cm) Weight: 284 lb 2.8 oz (128.9 kg) IBW/kg (Calculated) : 70.8  Temp (24hrs), Avg:98.3 F (36.8 C), Min:98 F (36.7 C), Max:98.9 F (37.2 C)  Recent Labs  Lab 12/01/17 0059 12/01/17 0544  12/01/17 0822 12/02/17 0413 12/02/17 0414 12/03/17 0406 12/04/17 0436 12/05/17 1457 12/06/17 0349  WBC  --   --    < >  --  6.7  --  4.7 3.8* 6.7 9.4  CREATININE  --   --    < >  --  3.54*  --  5.23* 5.77* 6.31* 6.48*  LATICACIDVEN 2.4* 2.5*  --  2.0*  --  1.1  --   --   --   --    < > = values in this interval not displayed.    Estimated Creatinine Clearance: 16.3 mL/min (A) (by C-G formula based on SCr of 6.48 mg/dL (H)).    Allergies  Allergen Reactions  . Penicillins Hives and Other (See Comments)    Childhood allergy. Has patient had a PCN reaction causing immediate rash, facial/tongue/throat swelling, SOB or lightheadedness with hypotension: NO Has patient had a PCN reaction causing severe rash involving mucus membranes or skin necrosis: No NO Has patient had a PCN reaction that required hospitalization No NO Has patient had a PCN reaction occurring within the last 10 years: NO If all of the above answers are "NO", then may pro    Antimicrobials this admission: 10/5 cefepime >>  10/5 flagyl >>   Microbiology results: 10/5 MRSA PCR: negative  Thank you for allowing pharmacy to participate in  this patient's care.  Despina Pole, Pharm. D. Clinical Pharmacist 12/06/2017 10:50 AM

## 2017-12-06 NOTE — Procedures (Signed)
       INITIAL HEMODIALYSIS TREATMENT NOTE:  First ever hemodialysis session performed via newly placed right femoral non-tunneled catheter.  No blood return from arterial port, therefore lines were reversed.  Pt began asking, "how much longer?" as early as 30 minutes into the session.  She complained of generalized discomfort in bed and was repositioned multiple times.  Catheter able to tolerate prescribed flow of 200cc/min despite numerous position changes.  At 1440 she stated, "I'm not gonna make it.  I can't do this."  When asked if she wanted the dialysis session to end, she responded affirmatively.  Treatment was discontinued after 1 hour and 25 minutes.  Net UF 375cc.  Pt was less fidgety and respiratory rate slowed once blood was being returned.  Suellyn Meenan L. Reynold Mantell, RN

## 2017-12-06 NOTE — Progress Notes (Signed)
PROGRESS NOTE  [name] LNL:[id number] DOB: 08-08-1970 DOA: 12/03/2017 PCP: Soyla Dryer, PA-C  Brief History: HPI On 12/26/2017 by Dr. Michail Jewels Brownis a 47 y.o.femalewith medical history significant foratrial fibrillation on Eliquis, chronic diastolic CHF, hypertension, CKDIV,and chronic anemia, now presenting to the emergency department for evaluation of acute upper abdominal pain. Patient reports that she developed acute onset of pain in the right upper quadrant this morning with radiation towards the right shoulder. Pain is rated as severe, constant, with no alleviating or exacerbating factors identified, and without vomiting, diarrhea, fevers, or chills. She denies any chest pain or palpitations and denies any change in her chronic dyspnea. She has never experienced these symptoms previously. Denies melena or hematochezia  Interim history Patient admitted for pneumoperitoneum. General surgery consulted, no surgery at this time. Plan treat medically.During the hospitalization, the patient developed worsening acute on chronic renal failure with metabolic acidosis.General surgery discussed patient's prognosis with her and family at bedside, patient understands that surgery is not an option at this time. She is now a DNR. Nephrology consulted for worsening renal failure. Palliative care consulted for [place].  Initially, after family meetings, patient and family did not want HD and the patient was transitioned to comfort care.  24 hours later, the patient and family changed their minds and wished to pursue dialysis.  Nephrology and general surgery were reconsulted. A temporary dialysis catheter was place on 12/06/17 and her first dialysis was initiated on 12/06/17.  Assessment/Plan: Abdominal pain secondary to Pneumoperitoneum -Patient presented with RUQ abdomina pain -CT abd/pelvis: free air in the abdomen suggests perforated viscus. Underlying  cause not seen.  -NG tube was placed on admission however patient pulled it out -General surgery consulted and appreciated, recommended treat medically with antibiotics as she is a high risk surgical candidate given her comorbidities. -Initially didnot want surgery at this time and understands the risks with and without surgery. -She does not want to be intubated or have CPR and has opted for DNR.  -cefepime, flagyl were discontinued when patient was transitioned to comfort care -12/05/17--pt and family changed their minds and wanted to pursue dialysis and continue abx -12/05/17--reconsult nephrology and general surgery -full liquids per general surgery  PersistentAtrial fibrillation  -CHADSVASC 3 (gender, CHF,HTN) -Heart rate in the ED was up to 130s. Patient was placed on diltiazem drip.  -drip stopped when pt was comfort care -HR remained controlled with drip off -Eliquis currenlty held   Chronic diastolic heart failure -BNP 328 (reviewed chart, this BNP is much lower than previous) -Suspect BNP elevated given CKD stage IV -Does not appear to be volume overloaded at this time -wasinitiallyplaced on gentle IVF given acute illness  -continue to follow intake/output, daily weights -10/8 Echo--EF 65-70%, no WMA, PASP 48 -lasix drip stopped previously due transition to comfort care-->restart until HD initiated -temporary dialysis catheter was place on 12/06/17 and her first dialysis was initiated on 12/06/17.  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 -creatinineup to 5.77 -baseline creatinine 2.5-1.9 -?ATNvs hemodynamic changes from pneumoperitoneum -Nephrology consulted and appreciated-->plan for RRT if family agrees -patient placed on lasix drip along with bicarb dripper renal -Foley catheter placed for rentention and diuresis -Continue to monitor BMP -pt more somnolent, in part due to uremia  Anemia of chronic kidney  disease -baselinehemoglobin currently8-9 -continue to monitor CBC -anemia panel obtained: Ironsat 5%, ferritin 62,folate 2.9 -FOBT pending -discussed IV iron with nephrology,  hold off for now as to not volume overload patient  Essential hypertension -previously stableoncardizem drip -now BPs soft off meds  Chronic leg ulcers -continue wound care  Elevated troponin -No complaints of chest pain -Given that patient has CKD4, troponin not very reliable -trop flat 0.05-0.06-0.05 -12/03/17 Echo--EF 65-70%, no WMA, trivial TR, PASP 48  Left renal mass/ Left adnexal lesion -incidentally noted on CT abd/pelvis -Patient will need outpatient follow   Goals of care -she does not wish for intubation or CPR and code status has been changed to DNR -Palliative care consulted for [place]  -Case discussed with Quinn Axe     Disposition Plan:Not stable for d/c Family Communication:family updated on 10/11   Consultants:Renal, general surgery, palliative medicine  Code Status: DNR  DVT Prophylaxis:Was on Eliquis, currently held, SCDs   Procedures: As Listed in Progress Note Above  Antibiotics: Cefepime 12/18/2017>>> Flagyl 10/5>>>     Subjective: Pt c/o some sob.  Denies cp, n/v/d.  C/o some upper abd pain, not worse than yesterday.  Denies headache, f/c  Objective: Vitals:   12/06/17 0500 12/06/17 0825 12/06/17 1048 12/06/17 1119  BP:   116/71   Pulse:  98 83 82  Resp:  (!) 36 (!) 32 (!) 23  Temp:  98.1 F (36.7 C)  98.3 F (36.8 C)  TempSrc:  Oral  Oral  SpO2:  100% 99% 100%  Weight: 128.9 kg     Height:        Intake/Output Summary (Last 24 hours) at 12/06/2017 1323 Last data filed at 12/06/2017 1000 Gross per 24 hour  Intake 1421.16 ml  Output 200 ml  Net 1221.16 ml   Weight change:  Exam:   General:  Pt is alert, follows commands appropriately, not in acute distress  HEENT: No icterus, No thrush, No neck mass,  [place]/AT  Cardiovascular: RRR, S1/S2, no rubs, no gallops  Respiratory: bibasilar crackles, no wheeze  Abdomen: Soft/+BS, RUQ&epigastric tender, non distended, no guarding  Extremities: 2+ LE edema, No lymphangitis, No petechiae, No rashes, no synovitis   Data Reviewed: I have personally reviewed following labs and imaging studies Basic Metabolic Panel: Recent Labs  Lab 12/01/17 0545 12/02/17 0413 12/03/17 0406 12/04/17 0436 12/05/17 1457 12/06/17 0349  NA 138 137 136 133* 132* 132*  K 4.4 3.7 5.1 4.3 4.6 4.5  CL 115* 119* 115* 108 106 104  CO2 11* 12* 11* 14* 14* 15*  GLUCOSE 71 99 94 102* 97 104*  BUN 44* 51* 72* 80* 83* 89*  CREATININE 3.05* 3.54* 5.23* 5.77* 6.31* 6.48*  CALCIUM 8.0* 6.5* 8.3* 8.1* 8.3* 8.3*  MG 1.8  --   --   --   --   --   PHOS  --   --  6.4* 5.4*  --  5.1*   Liver Function Tests: Recent Labs  Lab 11/27/2017 0925 12/01/17 0545 12/03/17 0406 12/04/17 0436 12/06/17 0349  AST 10* 12*  --   --   --   ALT <5 6  --   --   --   ALKPHOS 65 43  --   --   --   BILITOT 1.1 1.2  --   --   --   PROT 8.4* 7.4  --   --   --   ALBUMIN 2.8* 2.4* 2.0* 1.8* 1.7*   Recent Labs  Lab 12/09/2017 0925  LIPASE 49   No results for input(s): AMMONIA in the last 168 hours. Coagulation Profile: Recent Labs  Lab  11/27/2017 2201  INR 1.48   CBC: Recent Labs  Lab 12/01/17 0545 12/02/17 0413 12/02/17 0824 12/03/17 0406 12/04/17 0436 12/05/17 1457 12/06/17 0349  WBC 14.6* 6.7  --  4.7 3.8* 6.7 9.4  NEUTROABS 13.6*  --   --   --   --   --   --   HGB 10.3* 7.6* 9.1* 8.8* 7.9* 8.5* 8.7*  HCT 33.7* 24.3* 29.3* 28.1* 26.3* 28.3* 28.6*  MCV 96.3 94.2  --  91.5 92.3 91.6 90.8  PLT 323 270  --  269 199 171 192   Cardiac Enzymes: Recent Labs  Lab 12/20/2017 0925 12/19/2017 1442 12/01/17 0059 12/01/17 0545  TROPONINI 0.05* 0.05* 0.06* 0.05*   BNP: Invalid input(s): POCBNP CBG: No results for input(s): GLUCAP in the last 168 hours. HbA1C: No results for  input(s): HGBA1C in the last 72 hours. Urine analysis:    Component Value Date/Time   COLORURINE YELLOW 12/07/2017 1120   APPEARANCEUR CLEAR 11/29/2017 1120   LABSPEC 1.014 12/17/2017 1120   PHURINE 5.0 12/21/2017 1120   GLUCOSEU NEGATIVE 12/07/2017 1120   HGBUR NEGATIVE 12/03/2017 1120   BILIRUBINUR NEGATIVE 12/26/2017 1120   KETONESUR NEGATIVE 12/02/2017 1120   PROTEINUR 30 (A) 12/04/2017 1120   UROBILINOGEN 0.2 12/07/2014 0014   NITRITE NEGATIVE 11/27/2017 1120   LEUKOCYTESUR NEGATIVE 12/25/2017 1120   Sepsis Labs: @LABRCNTIP (procalcitonin:4,lacticidven:4) ) Recent Results (from the past 240 hour(s))  MRSA PCR Screening     Status: None   Collection Time: 12/06/2017 11:21 PM  Result Value Ref Range Status   MRSA by PCR NEGATIVE NEGATIVE Final    Comment:        The GeneXpert MRSA Assay (FDA approved for NASAL specimens only), is one component of a comprehensive MRSA colonization surveillance program. It is not intended to diagnose MRSA infection nor to guide or monitor treatment for MRSA infections. Performed at [facility], [address], [place], [place] [address]      Scheduled Meds: . ammonium lactate   Topical Q1200  . Chlorhexidine Gluconate Cloth  6 each Topical Q0600  . pantoprazole  40 mg Oral Daily  . sodium chloride flush  10-40 mL Intracatheter Q12H  . sodium hypochlorite   Irrigation Daily   Continuous Infusions: . sodium chloride 10 mL/hr at 12/06/17 1000  . ceFEPime (MAXIPIME) IV Stopped (12/05/17 1721)  . furosemide (LASIX) infusion Stopped (12/05/17 2151)  . metronidazole Stopped (12/06/17 4401)    Procedures/Studies: Ct Abdomen Pelvis Wo Contrast  Result Date: 12/01/2017 CLINICAL DATA:  Acute abdominal pain. EXAM: CT ABDOMEN AND PELVIS WITHOUT CONTRAST TECHNIQUE: Multidetector CT imaging of the abdomen and pelvis was performed following the standard protocol without IV contrast. COMPARISON:  CT scan January 02, 2012 FINDINGS: Lower chest:  Small calcified granulomas in the lung bases. No other acute abnormalities in the lung bases. Cardiomegaly. Small pericardial effusion. Coronary artery calcifications. Hepatobiliary: The liver demonstrates a nodular contour suggesting cirrhosis. No liver masses noted. The gallbladder is mildly distended but otherwise normal. Right upper quadrant ultrasound from today demonstrated no gallbladder abnormalities. Pancreas: Unremarkable. No pancreatic ductal dilatation or surrounding inflammatory changes. Spleen: Normal in size without focal abnormality. Adrenals/Urinary Tract: The adrenal glands are normal. There appear to be 2 right renal cysts. I suspect a small cyst in the left kidney as well. An exophytic mass off the left kidney on series 2, image 38 demonstrates an attenuation of 33 Hounsfield units and this mass measures 10 mm. No hydronephrosis. A few small stones are  seen in the right kidney. No ureterectasis or ureteral stone. The bladder is unremarkable. Stomach/Bowel: The stomach and small bowel is normal with no obstruction. The colon is unremarkable. Visualized portions of the appendix are normal. Vascular/Lymphatic: Atherosclerotic changes are seen in the nonaneurysmal aorta. Shotty nodes are seen in the retroperitoneum and gastrohepatic ligament. No gross adenopathy. Reproductive: The left ovary is somewhat enlarged measuring 6.5 cm. A rounded region of decreased echogenicity within the ovary demonstrates an attenuation of 40 Hounsfield units. The right ovary is normal in appearance. The uterus is unremarkable. Other: Free air seen in the abdomen with no identified cause. There is ascites as well, particularly adjacent to the liver and spleen, in the pericolic gutters, and in the pelvis. Musculoskeletal: No acute or significant osseous findings. IMPRESSION: 1. Free air in the abdomen suggests perforated viscus. An underlying cause is not seen. 2. Moderate ascites in the abdomen and pelvis. 3. Enlarged  left ovary containing a 5 cm rounded hypoechoic region. The hypoechoic region could represent a hemorrhagic cyst but is nonspecific. Recommend pelvic ultrasound for further evaluation of the left ovary. 4. Cirrhotic liver. 5. 10 mm exophytic mass off the left kidney with an attenuation of 33 Hounsfield units. This could represent a solid mass or hyperdense cyst. An ultrasound may further characterize when the patient is able. 6. Nonobstructive renal stones. 7. Atherosclerotic changes in the nonaneurysmal aorta. Findings called to Dr. Ashok Cordia. Electronically Signed   By: Dorise Bullion III M.D   On: 12/18/2017 20:50   Dg Chest 1 View  Result Date: 12/15/2017 CLINICAL DATA:  Evaluate NG tube placement EXAM: CHEST  1 VIEW COMPARISON:  November 30, 2017 FINDINGS: The distal NG tube is not well seen due to poor penetration. However, it appears to terminate below today's study, within the abdomen. No other change. IMPRESSION: The distal NG tube is not well seen due to poor penetration. However, it appears to terminate in the abdomen, below the bottom of today's study. Electronically Signed   By: Dorise Bullion III M.D   On: 12/19/2017 23:25   Dg Chest 2 View  Result Date: 12/06/2017 CLINICAL DATA:  Shortness of breath, RIGHT upper abdominal pain, RIGHT shoulder pain, awoke patient from sleep, history CHF, essential benign hypertension, atrial fibrillation, pulmonary hypertension EXAM: CHEST - 2 VIEW COMPARISON:  04/04/2017 FINDINGS: Enlargement of cardiac silhouette. Mediastinal contours and pulmonary vascularity normal. Mild RIGHT basilar atelectasis. Chronic accentuation of pulmonary markings, stable. No acute infiltrate, pleural effusion or pneumothorax. Bones unremarkable. IMPRESSION: Enlargement of cardiac silhouette with RIGHT basilar atelectasis. Electronically Signed   By: Lavonia Dana M.D.   On: 12/03/2017 17:31   US Renal  Result Date: 12/03/2017 CLINICAL DATA:  Acute renal failure EXAM: RENAL /  URINARY TRACT ULTRASOUND COMPLETE COMPARISON:  06/30/2016 ultrasound, 12/06/2017 FINDINGS: Right Kidney: Length: 10.6 cm. 1.5 cm cyst is noted. This is similar to that seen on prior CT examination. The smaller lower pole cyst is not as well appreciated on today's exam. No significant hydronephrosis is noted. Diffuse increased echogenicity is noted consistent with the given clinical history. Left Kidney: Length: 11 cm. Not well visualized aside from size. Diffuse increased echogenicity is noted. Bladder: Appears normal for degree of bladder distention. Note is made of ascites. This is similar to that seen on prior CT examination. IMPRESSION: Increased echogenicity consistent with medical renal disease. Stable right renal cyst is noted. No other focal abnormality is seen. Electronically Signed   By: Inez Catalina M.D.   On:  12/03/2017 16:06   Dg Chest Port 1 View  Result Date: 12/02/2017 CLINICAL DATA:  Evaluate NG tube placement EXAM: PORTABLE CHEST 1 VIEW COMPARISON:  Portable chest x-ray of 12/01/2017 FINDINGS: The NG tube does course below the hemidiaphragm into the stomach but the tip is not visualized. No active infiltrate or effusion is seen. The heart is mildly enlarged and stable. Right PICC line tip is noted to the upper SVC IMPRESSION: 1. NG tube tip extends into the stomach but the tip is not visualized. 2. Right PICC line tip overlies the upper SVC. Electronically Signed   By: Ivar Drape M.D.   On: 12/02/2017 17:18   Dg Chest Port 1 View  Result Date: 12/01/2017 CLINICAL DATA:  Post nasogastric tube placement EXAM: PORTABLE CHEST 1 VIEW COMPARISON:  Portable exam 1502 hours compared to 11/29/2017 FINDINGS: Nasogastric tube extends into stomach, tip not visualized. RIGHT arm PICC line tip projects over SVC. Enlargement of cardiac silhouette. Slight pulmonary vascular congestion. No gross acute infiltrate, pleural effusion or pneumothorax. IMPRESSION: Enlargement of cardiac silhouette. No acute  abnormalities. Electronically Signed   By: Lavonia Dana M.D.   On: 12/01/2017 15:18   US Abdomen Limited Ruq  Result Date: 12/04/2017 CLINICAL DATA:  Right upper quadrant abdominal pain. Chronic kidney disease and hypertension. EXAM: ULTRASOUND ABDOMEN LIMITED RIGHT UPPER QUADRANT COMPARISON:  Renal ultrasound dated 06/30/2016 and abdomen and pelvis CT dated 01/02/2012 FINDINGS: Gallbladder: No gallstones or wall thickening visualized. No sonographic Murphy sign noted by sonographer. Common bile duct: Diameter: 4.1 mm Liver: Mildly irregular borders. Normal echotexture. Portal vein is patent on color Doppler imaging with normal direction of blood flow towards the liver. Also noted is a markedly echogenic right kidney with progressive increased echogenicity, containing a small cyst. IMPRESSION: 1. No acute abnormality. 2. Markedly echogenic right kidney with progression, compatible with medical renal disease. Electronically Signed   By: Claudie Revering M.D.   On: 12/07/2017 10:27    Orson Eva, DO  [facility] Pager [phone]  If 7PM-7AM, please contact night-coverage [email or web address] Password [email or web address] 12/06/2017, 1:23 PM   LOS: 6 days

## 2017-12-06 NOTE — Progress Notes (Signed)
Subjective: Interval History: Patient offers no complaints this morning.  She has changed her mind and she want dialysis to be done.  Her appetite is poor but she did not have any nausea or vomiting.  Objective: Vital signs in last 24 hours: Temp:  [98 F (36.7 C)-98.9 F (37.2 C)] 98.2 F (36.8 C) (10/11 0400) Pulse Rate:  [81-91] 87 (10/10 1400) Resp:  [20-24] 20 (10/10 1400) BP: (101-121)/(69-79) 101/72 (10/10 1400) SpO2:  [99 %-100 %] 100 % (10/10 2012) Weight:  [128.9 kg] 128.9 kg (10/11 0500) Weight change:   Intake/Output from previous day: 10/10 0701 - 10/11 0700 In: 1380.4 [P.O.:375; I.V.:704.5; IV Piggyback:301] Out: 200 [Urine:200] Intake/Output this shift: No intake/output data recorded.  Generally: Patient still looks a sleepy but arousable and answer questions. Chest: She has bilateral inspiratory crackles. Heart exam regular rate and rhythm no murmur Extremities she has chronic venous stasis.  Lab Results: Recent Labs    12/05/17 1457 12/06/17 0349  WBC 6.7 9.4  HGB 8.5* 8.7*  HCT 28.3* 28.6*  PLT 171 192   BMET:  Recent Labs    12/05/17 1457 12/06/17 0349  NA 132* 132*  K 4.6 4.5  CL 106 104  CO2 14* 15*  GLUCOSE 97 104*  BUN 83* 89*  CREATININE 6.31* 6.48*  CALCIUM 8.3* 8.3*   No results for input(s): PTH in the last 72 hours. Iron Studies:  No results for input(s): IRON, TIBC, TRANSFERRIN, FERRITIN in the last 72 hours.  Studies/Results: No results found.  I have reviewed the patient's current medications.  Assessment/Plan:  acute kidney injury superimposed on chronic.  This could be secondary to ATN/cardiorenal.  Her renal function continued to decline.  Patient remained oliguric.  Her potassium is normal. 2] anemia: Her hemoglobin is below target goal.  Is a combination of iron deficiency anemia and anemia of chronic disease. 3] bone and mineral disorder: Her calcium and phosphorus is range. 4] hypertension: Her blood pressure is  reasonably controlled. 5] fluid management: Patient presently denies any difficulty breathing however patient is oliguric and has some sign of fluid overload. 6] atrial fibrillation: Her heart rate is controlled. Plan: We will ask surgery to put a femoral dialysis catheter 2] we will start dialysis for 2 hours today and possibly 3 hours tomorrow. 3] we will check her renal panel and CBC in the morning. 4] we will start patient on Epogen and IV iron starting tomorrow  LOS: 6 days   Inara Dike S 12/06/2017,8:24 AM

## 2017-12-06 NOTE — Progress Notes (Signed)
Asked to place femoral line catheter for dialysis.  Informed consent obtained from the patient.  The right groin region was prepped and draped using usual sterile technique with ChloraPrep.  Surgical site confirmation was performed.  A full drape, mask, gown, and gloves were used to perform this in a sterile manner.  1% Xylocaine was used for local anesthesia.  A needle was advanced into the right femoral vein using the Seldinger technique without difficulty.  A guidewire was then advanced into the right femoral vein.  An introducer was placed over the guidewire.  A dual-lumen dialysis catheter was then inserted over the guidewire.  A guidewire was removed.  Good aspiration of blood was noted from both ports.  Both ports were flushed with saline.  The catheter was secured in place using an 0 silk suture.  A dry sterile dressing was applied.  Patient tolerated the procedure well.  The patient was instructed that she cannot sit up in bed and is bedridden at this point.

## 2017-12-06 NOTE — Clinical Social Work Note (Signed)
LCSW aware that pt has changed her mind and is wanting to begin dialysis. Spoke with Tito Dine in Financial Counseling to request that Medicaid application be started for pt. Tito Dine indicated that she would do this. Spoke with pt's family about outpatient dialysis needs. They do not have a preference for clinic but want to stay in [place]. MD indicating pt will likely need SNF at dc. Pt's dc planning will be complicated by the fact that she does not currently have insurance. Will need to find a SNF to accept pt Medicaid pending likely with LOG. Discussed with CSW Supervisor, Edwyna Ready, who stated that once Financial Counselor sees pt/family, we will get a better idea if LOG is an option. If SNF outside of [place] accepts patient, will look at which clinic would be most appropriate for dialysis follow up and then refer. Spoke with pt's sister, Lauro Regulus, by phone to update.  Will follow up next week to further assist.

## 2017-12-07 DIAGNOSIS — Z7189 Other specified counseling: Secondary | ICD-10-CM

## 2017-12-07 LAB — RENAL FUNCTION PANEL
Albumin: 1.8 g/dL — ABNORMAL LOW (ref 3.5–5.0)
Anion gap: 16 — ABNORMAL HIGH (ref 5–15)
BUN: 80 mg/dL — AB (ref 6–20)
CHLORIDE: 103 mmol/L (ref 98–111)
CO2: 13 mmol/L — AB (ref 22–32)
CREATININE: 6.49 mg/dL — AB (ref 0.44–1.00)
Calcium: 8.6 mg/dL — ABNORMAL LOW (ref 8.9–10.3)
GFR calc Af Amer: 8 mL/min — ABNORMAL LOW (ref >60)
GFR calc non Af Amer: 7 mL/min — ABNORMAL LOW (ref >60)
Glucose, Bld: 102 mg/dL — ABNORMAL HIGH (ref 70–99)
Phosphorus: 5.6 mg/dL — ABNORMAL HIGH (ref 2.5–4.6)
Potassium: 5 mmol/L (ref 3.5–5.1)
Sodium: 132 mmol/L — ABNORMAL LOW (ref 135–145)

## 2017-12-07 LAB — CBC
HEMATOCRIT: 28 % — AB (ref 36.0–46.0)
HEMOGLOBIN: 8.6 g/dL — AB (ref 12.0–15.0)
MCH: 28.4 pg (ref 26.0–34.0)
MCHC: 30.7 g/dL (ref 30.0–36.0)
MCV: 92.4 fL (ref 80.0–100.0)
NRBC: 0.8 % — AB (ref 0.0–0.2)
Platelets: 195 10*3/uL (ref 150–400)
RBC: 3.03 MIL/uL — AB (ref 3.87–5.11)
RDW: 16.3 % — ABNORMAL HIGH (ref 11.5–15.5)
WBC: 25.6 10*3/uL — ABNORMAL HIGH (ref 4.0–10.5)

## 2017-12-07 LAB — HEPATITIS B SURFACE ANTIGEN: HEP B S AG: NEGATIVE

## 2017-12-07 LAB — HEPATITIS B SURFACE ANTIBODY, QUANTITATIVE: HEPATITIS B-POST: 9.8 m[IU]/mL — AB

## 2017-12-07 LAB — HEPATITIS B CORE ANTIBODY, TOTAL: Hep B Core Total Ab: NEGATIVE

## 2017-12-07 MED ORDER — MORPHINE SULFATE (PF) 4 MG/ML IV SOLN
2.0000 mg | INTRAVENOUS | Status: DC | PRN
  Administered 2017-12-07: 4 mg via INTRAVENOUS
  Filled 2017-12-07: qty 1

## 2017-12-07 MED ORDER — SODIUM CHLORIDE 0.9 % IV SOLN: 100.0000 mL | INTRAVENOUS | Status: DC | PRN

## 2017-12-07 MED ORDER — SODIUM CHLORIDE 0.9 % IV SOLN
0.5000 mg/h | INTRAVENOUS | Status: DC
  Administered 2017-12-07: 0.5 mg/h via INTRAVENOUS
  Administered 2017-12-08: 2 mg/h via INTRAVENOUS
  Filled 2017-12-07: qty 2.5

## 2017-12-07 MED ORDER — SODIUM CHLORIDE 0.9 % IV SOLN
1.0000 mg/h | INTRAVENOUS | Status: DC
  Filled 2017-12-07: qty 10

## 2017-12-07 MED ORDER — EPOETIN ALFA 3000 UNIT/ML IJ SOLN
6000.0000 [IU] | INTRAMUSCULAR | Status: DC
  Filled 2017-12-07: qty 2

## 2017-12-07 MED ORDER — METRONIDAZOLE 500 MG PO TABS: 500.0000 mg | ORAL_TABLET | Freq: Three times a day (TID) | ORAL | Status: DC

## 2017-12-07 MED ORDER — HYDROMORPHONE HCL PF 10 MG/ML IJ SOLN
INTRAMUSCULAR | Status: AC
  Filled 2017-12-07: qty 5

## 2017-12-07 MED ORDER — SODIUM CHLORIDE 0.9 % IV SOLN
INTRAVENOUS | Status: DC | PRN
  Administered 2017-12-07: 250 mL via INTRAVENOUS

## 2017-12-07 MED ORDER — SODIUM CHLORIDE 0.9 % IV SOLN
62.5000 mg | Freq: Once | INTRAVENOUS | Status: AC
  Administered 2017-12-07: 62.5 mg via INTRAVENOUS
  Filled 2017-12-07: qty 5

## 2017-12-07 MED ORDER — EPOETIN ALFA 10000 UNIT/ML IJ SOLN
INTRAMUSCULAR | Status: AC
  Filled 2017-12-07: qty 1

## 2017-12-07 MED ORDER — GLYCOPYRROLATE 0.2 MG/ML IJ SOLN
0.1000 mg | Freq: Once | INTRAMUSCULAR | Status: AC
  Administered 2017-12-07: 0.1 mg via INTRAVENOUS
  Filled 2017-12-07: qty 1

## 2017-12-07 MED ORDER — LORAZEPAM 2 MG/ML IJ SOLN: 0.5000 mg | Freq: Once | INTRAMUSCULAR | Status: DC

## 2017-12-07 MED ORDER — HALOPERIDOL LACTATE 5 MG/ML IJ SOLN
2.0000 mg | Freq: Once | INTRAMUSCULAR | Status: AC
  Administered 2017-12-07: 2 mg via INTRAVENOUS
  Filled 2017-12-07: qty 1

## 2017-12-07 MED ORDER — FENTANYL CITRATE (PF) 100 MCG/2ML IJ SOLN
25.0000 ug | INTRAMUSCULAR | Status: DC | PRN
  Administered 2017-12-07: 25 ug via INTRAVENOUS
  Filled 2017-12-07: qty 2

## 2017-12-07 MED ORDER — MORPHINE SULFATE (PF) 2 MG/ML IV SOLN
2.0000 mg | INTRAVENOUS | Status: DC | PRN
  Administered 2017-12-07 (×4): 2 mg via INTRAVENOUS
  Filled 2017-12-07 (×4): qty 1

## 2017-12-07 MED ORDER — HEPARIN SODIUM (PORCINE) 1000 UNIT/ML IJ SOLN
INTRAMUSCULAR | Status: AC
  Filled 2017-12-07: qty 1

## 2017-12-07 NOTE — Progress Notes (Addendum)
Responded to nursing call:  Pt wanted to stop dialysis after 22 min.  She stopped dialysis 10/11 after 90 minutes.  Pt does not want any further HD.  Pt complains of pain all over and cannot remain in appropriate position for dialysis.  Unable to give opioids/hypnotics due to soft BPs with intradialytic hypotension   I discussed risks, benefits and alternatives with patient.   In speaking with Ms. [name], tostenson demonstrated the ability to understand the serious nature and consequences of stopping/cancelling hemodialysis prior to completion of an adequate course of treatment. She was able to articulate the consequences of stopping/cancelling dialysis prior to completion of adequate therapy, and she understood that could result in bodily harm and death.  Patient stated she did not want to pursue further hemodialysis at this time. When asked if she understood what would happen if she refuses/stops hemodialysis, patient stated, "Yeah, I'm gonna die.  I just can't do it."    Patient is otherwise A&O x 4 presently Nephrology updated Sisters updated-->agree with transition to comfort care measures and ultimately to residential hospice. Morphine 2 mg IV q 1 hour prn pain and sob started Discontinue medications with curative intent          Vitals:   12/07/17 0000 12/07/17 0400 12/07/17 0500 12/07/17 0851  BP:   96/66   Pulse:   87   Resp:      Temp: (!) 97.5 F (36.4 C) 97.8 F (36.6 C)  (!) 97.5 F (36.4 C)  TempSrc: Oral Oral  Oral  SpO2:   100%   Weight:  129.8 kg    Height:       CV--RRR Lung--scattered rales Abd--soft+BS/NT   Assessment/Plan: -discussed goals of care again with patient and sisters -discussed concepts of hospice and comfort care/dignity -pt expressed understanding and wants to transition to residential hospice -total time 45 min in addition to that spent earlier today (35 min)   Orson Eva, DO [facility]

## 2017-12-07 NOTE — Procedures (Signed)
      HEMODIALYSIS TREATMENT NOTE (HD#2):  Pt only able to tolerate 22 minutes of dialysis today before she told me to stop her treatment.  She was unable to lie still or remain reclined as needed for optimal catheter flow.  SBP high 80s, unable to ultrafiltrate.  Family at bedside repeatedly telling patient she was "letting them down" and that she was "gonna die now."  I explained to [place] that without dialysis there was no way to treat her declining kidney function.  She stated, "Yeah, I'm gonna die.  I just can't do it."   I explained that we could now give her medication to make her more comfortable.  I applauded her effort to attempt dialysis and asked her family to support [place]'s decision and focus on just visiting with her.  Quinn Axe, Dr. Lowanda Foster, and Mysti Smart, primary RN were notified.  Rockwell [place], RN

## 2017-12-07 NOTE — Progress Notes (Signed)
PROGRESS NOTE  [name] HRC:[id number] DOB: Feb 23, 1971 DOA: 12/06/2017 PCP: Soyla Dryer, PA-C  Brief History: HPI On 12/07/2017 by Dr. Michail Jewels Brownis a 47 y.o.femalewith medical history significant foratrial fibrillation on Eliquis, chronic diastolic CHF, hypertension, CKDIV,and chronic anemia, now presenting to the emergency department for evaluation of acute upper abdominal pain. Patient reports that she developed acute onset of pain in the right upper quadrant this morning with radiation towards the right shoulder. Pain is rated as severe, constant, with no alleviating or exacerbating factors identified, and without vomiting, diarrhea, fevers, or chills. She denies any chest pain or palpitations and denies any change in her chronic dyspnea. She has never experienced these symptoms previously. Denies melena or hematochezia  Interim history Patient admitted for pneumoperitoneum. General surgery consulted, no surgery at this time. Plan treat medically.During the hospitalization, the patient developed worsening acute on chronic renal failure with metabolic acidosis.General surgery discussed patient's prognosis with her and family at bedside, patient understands that surgery is not an option at this time. She is now a DNR. Nephrology consulted for worsening renal failure. Palliative care consulted for [place].Initially, after family meetings, patient and family did not want HD and the patient was transitioned to comfort care. 24 hours later, the patient and family changed their minds and wished to pursue dialysis. Nephrology and general surgery were reconsulted. A temporary dialysis catheter was place on 12/06/17 and her first dialysis was initiated on 12/06/17.  Assessment/Plan: Abdominal pain secondary to Pneumoperitoneum -Patient presented with RUQ abdomina pain -12/06/2017 CT abd/pelvis: free air in the abdomen suggests perforated viscus.  Underlying cause not seen.  -NG tube was placed on admission however patient pulled it out -General surgery consulted and appreciated, recommended treat medically with antibiotics as she is a high risk surgical candidate given her comorbidities. -Initially didnot want surgery at this time and understands the risks with and without surgery. -She does not want to be intubated or have CPR and has opted for DNR.  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 Patient was placed on diltiazem drip.  -drip was stopped when pt was comfort care -HR remained controlled with drip off -Eliquis currenlty held  -start IV heparin  Chronic diastolic heart failure -BNP 328 (reviewed chart, this BNP is much lower than previous) -Suspect BNP elevated given CKD stage IV -Does not appear to be volume overloaded at this time -wasinitiallyplaced on gentle IVF given acute illness  -continue to follow intake/output, daily weights -10/8 Echo--EF 65-70%, no WMA, PASP 48 -lasix dripstopped previously due transition to comfort  care-->restart until HD initiated -temporary dialysis catheter was place on 12/06/17 and her first dialysis was initiated on 12/06/17.  Anemia of chronic kidney  disease -baselinehemoglobin currently8-9 -continue to monitor CBC -anemia panel obtained: Ironsat 5%, ferritin 62,folate 2.9 -FOBT pending -discussed IV iron with nephrology-->dosing per renal  Essential hypertension -previouslystableoncardizem drip -now BPs soft off meds  Chronic leg ulcers -continue wound care with 1/4 Dakins  Elevated troponin -No complaints of chest pain -Given that patient has CKD4, troponin not very reliable -trop flat 0.05-0.06-0.05 -12/03/17 Echo--EF 65-70%, no WMA, trivial TR, PASP 48  Left renal mass/ Left adnexal lesion -incidentally noted on CT abd/pelvis -Patient will need outpatient follow   Goals of care -she does not wish for intubation or CPR and code status has been changed to DNR -Palliative care consulted for [place]  -Case discussed with Quinn Axe     Disposition Plan:Not stable for d/c Family Communication:family updated on 10/11   Consultants:Renal, general surgery, palliative medicine  Code Status: DNR  DVT Prophylaxis:Was on Eliquis, currently held, SCDs   Procedures: As Listed in Progress Note Above  Antibiotics: Cefepime 11/26/2017>>> Flagyl 10/5>>>  Subjective: Patient denies fevers, chills, headache, chest pain, dyspnea, nausea, vomiting, diarrhea, dysuria,hematochezia, and melena.  She c/o mild abdominal pain, but better than at time of admission.  She is somnolent, but awakens and answers appropriately   Objective: Vitals:   12/06/17 2036 12/07/17 0000 12/07/17 0400 12/07/17 0500  BP:    96/66  Pulse:    87  Resp:      Temp:  (!) 97.5 F (36.4 C) 97.8 F (36.6 C)   TempSrc:  Oral Oral   SpO2: 100%   100%  Weight:   129.8 kg   Height:        Intake/Output Summary (Last 24 hours) at 12/07/2017 0850 Last data filed  at 12/07/2017 0813 Gross per 24 hour  Intake 833.03 ml  Output 375 ml  Net 458.03 ml   Weight change: 0.2 kg Exam:   General:  Pt is alert, follows commands appropriately, not in acute distress  HEENT: No icterus, No thrush, No neck mass, [place]/AT  Cardiovascular: IRRR, S1/S2, no rubs, no gallops  Respiratory: bibasilar crackles, no wheeze  Abdomen: Soft/+BS, epigastric tender, non distended, no guarding  Extremities: 2 +LE edema, No lymphangitis, No petechiae, No rashes, no synovitis  RIGHT Leg below      Data Reviewed: I have personally reviewed following labs and imaging studies Basic Metabolic Panel: Recent Labs  Lab 12/01/17 0545  12/03/17 0406 12/04/17 0436 12/05/17 1457 12/06/17 0349 12/07/17 0522  NA 138   < > 136 133* 132* 132* 132*  K 4.4   < > 5.1 4.3 4.6 4.5 5.0  CL 115*   < > 115* 108 106 104 103  CO2 11*   < > 11* 14* 14* 15* 13*  GLUCOSE 71   < > 94 102* 97 104* 102*  BUN 44*   < > 72* 80* 83* 89* 80*  CREATININE 3.05*   < > 5.23* 5.77* 6.31* 6.48* 6.49*  CALCIUM 8.0*   < > 8.3* 8.1* 8.3* 8.3* 8.6*  MG 1.8  --   --   --   --   --   --   PHOS  --   --  6.4* 5.4*  --  5.1* 5.6*   < > = values in this interval not displayed.   Liver Function Tests: Recent Labs  Lab 12/15/2017 0925 12/01/17 0545 12/03/17 0406 12/04/17 0436 12/06/17 0349 12/07/17 0522  AST 10* 12*  --   --   --   --  ALT <5 6  --   --   --   --   ALKPHOS 65 43  --   --   --   --   BILITOT 1.1 1.2  --   --   --   --   PROT 8.4* 7.4  --   --   --   --   ALBUMIN 2.8* 2.4* 2.0* 1.8* 1.7* 1.8*   Recent Labs  Lab 12/07/2017 0925  LIPASE 49   No results for input(s): AMMONIA in the last 168 hours. Coagulation Profile: Recent Labs  Lab 12/20/2017 2201  INR 1.48   CBC: Recent Labs  Lab 12/01/17 0545  12/03/17 0406 12/04/17 0436 12/05/17 1457 12/06/17 0349 12/07/17 0522  WBC 14.6*   < > 4.7 3.8* 6.7 9.4 25.6*  NEUTROABS 13.6*  --   --   --   --   --   --   HGB 10.3*    < > 8.8* 7.9* 8.5* 8.7* 8.6*  HCT 33.7*   < > 28.1* 26.3* 28.3* 28.6* 28.0*  MCV 96.3   < > 91.5 92.3 91.6 90.8 92.4  PLT 323   < > 269 199 171 192 195   < > = values in this interval not displayed.   Cardiac Enzymes: Recent Labs  Lab 11/29/2017 0925 12/19/2017 1442 12/01/17 0059 12/01/17 0545  TROPONINI 0.05* 0.05* 0.06* 0.05*   BNP: Invalid input(s): POCBNP CBG: No results for input(s): GLUCAP in the last 168 hours. HbA1C: No results for input(s): HGBA1C in the last 72 hours. Urine analysis:    Component Value Date/Time   COLORURINE YELLOW 12/06/2017 1120   APPEARANCEUR CLEAR 12/07/2017 1120   LABSPEC 1.014 12/19/2017 1120   PHURINE 5.0 12/11/2017 1120   GLUCOSEU NEGATIVE 12/17/2017 1120   HGBUR NEGATIVE 12/25/2017 1120   [place] 12/02/2017 1120   KETONESUR NEGATIVE 12/23/2017 1120   PROTEINUR 30 (A) 12/26/2017 1120   UROBILINOGEN 0.2 12/07/2014 0014   NITRITE NEGATIVE 12/25/2017 1120   LEUKOCYTESUR NEGATIVE 12/15/2017 1120   Sepsis Labs: @LABRCNTIP (procalcitonin:4,lacticidven:4) ) Recent Results (from the past 240 hour(s))  MRSA PCR Screening     Status: None   Collection Time: 12/15/2017 11:21 PM  Result Value Ref Range Status   MRSA by PCR NEGATIVE NEGATIVE Final    Comment:        The GeneXpert MRSA Assay (FDA approved for NASAL specimens only), is one component of a comprehensive MRSA colonization surveillance program. It is not intended to diagnose MRSA infection nor to guide or monitor treatment for MRSA infections. Performed at [facility], [address] [place], [place] [address]   Culture, blood (Routine X 2) w Reflex to ID Panel     Status: None (Preliminary result)   Collection Time: 12/06/17  2:07 PM  Result Value Ref Range Status   Specimen Description   Final    BLOOD RIGHT FOREARM BOTTLES DRAWN AEROBIC AND ANAEROBIC   Special Requests   Final    Blood Culture adequate volume Performed at [facility], [address]  [place], [place] [address]    Culture PENDING  Incomplete   Report Status PENDING  Incomplete  Culture, blood (Routine X 2) w Reflex to ID Panel     Status: None (Preliminary result)   Collection Time: 12/06/17  2:07 PM  Result Value Ref Range Status   Specimen Description   Final    BLOOD RIGHT FOREARM BOTTLES DRAWN AEROBIC AND ANAEROBIC   Special Requests  Final    Blood Culture results may not be optimal due to an excessive volume of blood received in culture bottles Performed at [facility], [address], [place], [place] [address]    Culture PENDING  Incomplete   Report Status PENDING  Incomplete     Scheduled Meds: . ammonium lactate   Topical Q1200  . Chlorhexidine Gluconate Cloth  6 each Topical Q0600  . epoetin (EPOGEN/PROCRIT) injection  6,000 Units Intravenous Q T,Th,Sa-HD  . pantoprazole  40 mg Oral Daily  . sodium chloride flush  10-40 mL Intracatheter Q12H  . sodium hypochlorite   Irrigation Daily   Continuous Infusions: . sodium chloride Stopped (12/06/17 1750)  . sodium chloride    . sodium chloride    . ceFEPime (MAXIPIME) IV 200 mL/hr at 12/06/17 1800  . ferric gluconate (FERRLECIT/NULECIT) IV    . furosemide (LASIX) infusion 8 mg/hr (12/07/17 0813)  . metronidazole 500 mg (12/07/17 0813)    Procedures/Studies: Ct Abdomen Pelvis Wo Contrast  Result Date: 11/27/2017 CLINICAL DATA:  Acute abdominal pain. EXAM: CT ABDOMEN AND PELVIS WITHOUT CONTRAST TECHNIQUE: Multidetector CT imaging of the abdomen and pelvis was performed following the standard protocol without IV contrast. COMPARISON:  CT scan January 02, 2012 FINDINGS: Lower chest: Small calcified granulomas in the lung bases. No other acute abnormalities in the lung bases. Cardiomegaly. Small pericardial effusion. Coronary artery calcifications. Hepatobiliary: The liver demonstrates a nodular contour suggesting cirrhosis. No liver masses noted. The gallbladder is mildly distended but otherwise normal. Right  upper quadrant ultrasound from today demonstrated no gallbladder abnormalities. Pancreas: Unremarkable. No pancreatic ductal dilatation or surrounding inflammatory changes. Spleen: Normal in size without focal abnormality. Adrenals/Urinary Tract: The adrenal glands are normal. There appear to be 2 right renal cysts. I suspect a small cyst in the left kidney as well. An exophytic mass off the left kidney on series 2, image 38 demonstrates an attenuation of 33 Hounsfield units and this mass measures 10 mm. No hydronephrosis. A few small stones are seen in the right kidney. No ureterectasis or ureteral stone. The bladder is unremarkable. Stomach/Bowel: The stomach and small bowel is normal with no obstruction. The colon is unremarkable. Visualized portions of the appendix are normal. Vascular/Lymphatic: Atherosclerotic changes are seen in the nonaneurysmal aorta. Shotty nodes are seen in the retroperitoneum and gastrohepatic ligament. No gross adenopathy. Reproductive: The left ovary is somewhat enlarged measuring 6.5 cm. A rounded region of decreased echogenicity within the ovary demonstrates an attenuation of 40 Hounsfield units. The right ovary is normal in appearance. The uterus is unremarkable. Other: Free air seen in the abdomen with no identified cause. There is ascites as well, particularly adjacent to the liver and spleen, in the pericolic gutters, and in the pelvis. Musculoskeletal: No acute or significant osseous findings. IMPRESSION: 1. Free air in the abdomen suggests perforated viscus. An underlying cause is not seen. 2. Moderate ascites in the abdomen and pelvis. 3. Enlarged left ovary containing a 5 cm rounded hypoechoic region. The hypoechoic region could represent a hemorrhagic cyst but is nonspecific. Recommend pelvic ultrasound for further evaluation of the left ovary. 4. Cirrhotic liver. 5. 10 mm exophytic mass off the left kidney with an attenuation of 33 Hounsfield units. This could represent a  solid mass or hyperdense cyst. An ultrasound may further characterize when the patient is able. 6. Nonobstructive renal stones. 7. Atherosclerotic changes in the nonaneurysmal aorta. Findings called to Dr. Ashok Cordia. Electronically Signed   By: Dorise Bullion III M.D  On: 12/01/2017 20:50   Dg Chest 1 View  Result Date: 12/22/2017 CLINICAL DATA:  Evaluate NG tube placement EXAM: CHEST  1 VIEW COMPARISON:  November 30, 2017 FINDINGS: The distal NG tube is not well seen due to poor penetration. However, it appears to terminate below today's study, within the abdomen. No other change. IMPRESSION: The distal NG tube is not well seen due to poor penetration. However, it appears to terminate in the abdomen, below the bottom of today's study. Electronically Signed   By: Dorise Bullion III M.D   On: 12/26/2017 23:25   Dg Chest 2 View  Result Date: 12/07/2017 CLINICAL DATA:  Shortness of breath, RIGHT upper abdominal pain, RIGHT shoulder pain, awoke patient from sleep, history CHF, essential benign hypertension, atrial fibrillation, pulmonary hypertension EXAM: CHEST - 2 VIEW COMPARISON:  04/04/2017 FINDINGS: Enlargement of cardiac silhouette. Mediastinal contours and pulmonary vascularity normal. Mild RIGHT basilar atelectasis. Chronic accentuation of pulmonary markings, stable. No acute infiltrate, pleural effusion or pneumothorax. Bones unremarkable. IMPRESSION: Enlargement of cardiac silhouette with RIGHT basilar atelectasis. Electronically Signed   By: Lavonia Dana M.D.   On: 12/02/2017 17:31   US Renal  Result Date: 12/03/2017 CLINICAL DATA:  Acute renal failure EXAM: RENAL / URINARY TRACT ULTRASOUND COMPLETE COMPARISON:  06/30/2016 ultrasound, 12/09/2017 FINDINGS: Right Kidney: Length: 10.6 cm. 1.5 cm cyst is noted. This is similar to that seen on prior CT examination. The smaller lower pole cyst is not as well appreciated on today's exam. No significant hydronephrosis is noted. Diffuse increased echogenicity  is noted consistent with the given clinical history. Left Kidney: Length: 11 cm. Not well visualized aside from size. Diffuse increased echogenicity is noted. Bladder: Appears normal for degree of bladder distention. Note is made of ascites. This is similar to that seen on prior CT examination. IMPRESSION: Increased echogenicity consistent with medical renal disease. Stable right renal cyst is noted. No other focal abnormality is seen. Electronically Signed   By: Inez Catalina M.D.   On: 12/03/2017 16:06   Dg Chest Port 1 View  Result Date: 12/02/2017 CLINICAL DATA:  Evaluate NG tube placement EXAM: PORTABLE CHEST 1 VIEW COMPARISON:  Portable chest x-ray of 12/01/2017 FINDINGS: The NG tube does course below the hemidiaphragm into the stomach but the tip is not visualized. No active infiltrate or effusion is seen. The heart is mildly enlarged and stable. Right PICC line tip is noted to the upper SVC IMPRESSION: 1. NG tube tip extends into the stomach but the tip is not visualized. 2. Right PICC line tip overlies the upper SVC. Electronically Signed   By: Ivar Drape M.D.   On: 12/02/2017 17:18   Dg Chest Port 1 View  Result Date: 12/01/2017 CLINICAL DATA:  Post nasogastric tube placement EXAM: PORTABLE CHEST 1 VIEW COMPARISON:  Portable exam 1502 hours compared to 11/28/2017 FINDINGS: Nasogastric tube extends into stomach, tip not visualized. RIGHT arm PICC line tip projects over SVC. Enlargement of cardiac silhouette. Slight pulmonary vascular congestion. No gross acute infiltrate, pleural effusion or pneumothorax. IMPRESSION: Enlargement of cardiac silhouette. No acute abnormalities. Electronically Signed   By: Lavonia Dana M.D.   On: 12/01/2017 15:18   US Abdomen Limited Ruq  Result Date: 12/23/2017 CLINICAL DATA:  Right upper quadrant abdominal pain. Chronic kidney disease and hypertension. EXAM: ULTRASOUND ABDOMEN LIMITED RIGHT UPPER QUADRANT COMPARISON:  Renal ultrasound dated 06/30/2016 and abdomen  and pelvis CT dated 01/02/2012 FINDINGS: Gallbladder: No gallstones or wall thickening visualized. No sonographic Murphy sign noted  by sonographer. Common bile duct: Diameter: 4.1 mm Liver: Mildly irregular borders. Normal echotexture. Portal vein is patent on color Doppler imaging with normal direction of blood flow towards the liver. Also noted is a markedly echogenic right kidney with progressive increased echogenicity, containing a small cyst. IMPRESSION: 1. No acute abnormality. 2. Markedly echogenic right kidney with progression, compatible with medical renal disease. Electronically Signed   By: Claudie Revering M.D.   On: 12/21/2017 10:27    Orson Eva, DO  [facility] Pager [phone]  If 7PM-7AM, please contact night-coverage [email or web address] Password [email or web address] 12/07/2017, 8:50 AM   LOS: 7 days

## 2017-12-07 NOTE — Progress Notes (Signed)
Subjective: Interval History: Patient is still somnolent but arousable.  Presently patient still denies any nausea or vomiting.  He denies any difficulty breathing.  Patient wants to continue with hemodialysis even though she stopped it yesterday after 1 hour and half.  I asked her multiple times but patient consistently wants the dialysis to be continued.  Objective: Vital signs in last 24 hours: Temp:  [97.3 F (36.3 C)-98.3 F (36.8 C)] 97.8 F (36.6 C) (10/12 0400) Pulse Rate:  [82-123] 87 (10/12 0500) Resp:  [23-36] 27 (10/11 2028) BP: (90-116)/(50-71) 96/66 (10/12 0500) SpO2:  [96 %-100 %] 100 % (10/12 0500) Weight:  [129.1 kg-129.8 kg] 129.8 kg (10/12 0400) Weight change: 0.2 kg  Intake/Output from previous day: 10/11 0701 - 10/12 0700 In: 799.5 [I.V.:402.8; IV Piggyback:396.8] Out: 375  Intake/Output this shift: No intake/output data recorded.  Generally: Patient still looks lethargic but answer questions. Chest: She has bilateral inspiratory crackles. Heart exam regular rate and rhythm no murmur Extremities she has chronic venous stasis and edema.  Lab Results: Recent Labs    12/06/17 0349 12/07/17 0522  WBC 9.4 25.6*  HGB 8.7* 8.6*  HCT 28.6* 28.0*  PLT 192 195   BMET:  Recent Labs    12/06/17 0349 12/07/17 0522  NA 132* 132*  K 4.5 5.0  CL 104 103  CO2 15* 13*  GLUCOSE 104* 102*  BUN 89* 80*  CREATININE 6.48* 6.49*  CALCIUM 8.3* 8.6*   No results for input(s): PTH in the last 72 hours. Iron Studies:  No results for input(s): IRON, TIBC, TRANSFERRIN, FERRITIN in the last 72 hours.  Studies/Results: No results found.  I have reviewed the patient's current medications.  Assessment/Plan:  acute kidney injury superimposed on chronic.  This could be secondary to ATN/cardiorenal.  Patient has received 1 hour and half of dialysis yesterday.  Presently she did not have any nausea or vomiting. 2] anemia: Her hemoglobin is below target goal.  Her  hemoglobin remains a stable.  Is a combination of iron deficiency anemia and anemia of chronic disease. 3] bone and mineral disorder: Her calcium and phosphorus is range. 4] hypertension: Her blood pressure is reasonably controlled. 5] fluid management: Patient presently denies any difficulty breathing.  Patient remained oliguric.  Has some sign of fluid overload. 6] atrial fibrillation: Her heart rate is controlled. Plan: We will dialyze patient for 3 hours today. 2] we will try to remove about 2 L if systolic blood pressure remains above 90. 3] we will start her on Epogen 6000 units IV after each dialysis. 4] we will check her renal panel and CBC in the morning.  LOS: 7 days   [name] S 12/07/2017,8:10 AM

## 2017-12-07 NOTE — Progress Notes (Signed)
Patient vehemently requests for the bandage to her RLE to be removed (without discussion).  This RN spoke with patient in an attempt to advise the patient against this action to no avail. A compromise was made.  The dressing would be removed only if a foam were to be applied. The patient agreed and dressing applied.

## 2017-12-08 MED ORDER — HYDROMORPHONE HCL PF 10 MG/ML IJ SOLN
INTRAMUSCULAR | Status: AC
  Filled 2017-12-08: qty 5

## 2017-12-08 MED ORDER — HALOPERIDOL LACTATE 5 MG/ML IJ SOLN
2.0000 mg | Freq: Four times a day (QID) | INTRAMUSCULAR | Status: DC | PRN
  Administered 2017-12-08: 2 mg via INTRAVENOUS
  Filled 2017-12-08: qty 1

## 2017-12-08 MED ORDER — LORAZEPAM 2 MG/ML IJ SOLN
1.0000 mg | INTRAMUSCULAR | Status: DC | PRN
  Administered 2017-12-08: 1 mg via INTRAVENOUS
  Filled 2017-12-08: qty 1

## 2017-12-08 MED ORDER — HALOPERIDOL LACTATE 5 MG/ML IJ SOLN: 5.0000 mg | Freq: Four times a day (QID) | INTRAMUSCULAR | Status: DC | PRN

## 2017-12-08 MED ORDER — HALOPERIDOL LACTATE 5 MG/ML IJ SOLN
3.0000 mg | Freq: Once | INTRAMUSCULAR | Status: AC
  Administered 2017-12-08: 3 mg via INTRAVENOUS
  Filled 2017-12-08: qty 1

## 2017-12-11 LAB — CULTURE, BLOOD (ROUTINE X 2)
Culture: NO GROWTH
Culture: NO GROWTH
Special Requests: ADEQUATE

## 2017-12-27 NOTE — Death Summary Note (Addendum)
DEATH SUMMARY   Patient Details  Name: [name] MRN: [id number] DOB: Mar 03, 1970  Admission/Discharge Information   Admit Date:  16-Dec-2017  Date of Death: Date of Death: 2017/12/24  Time of Death: Time of Death: 0658  Length of Stay: 05-20-22  Referring Physician: Soyla Dryer, PA-C   Reason(s) for Hospitalization  Pneumoperitoneum  Diagnoses  Preliminary cause of death: Acute on chronic renal failure ([place]) Secondary Diagnoses (including complications and co-morbidities):  Abdominal pain secondary to Pneumoperitoneum -likely due to perforated peptic ulcer--site unknown -Patient presented with RUQ abdomina pain -16-Dec-2017 CT abd/pelvis: free air in the abdomen suggests perforated viscus. Underlying cause not seen.  -NG tube was placed on admission however patient pulled it out -General surgery consulted and appreciated, recommended treat medically with antibiotics as she is a high risk surgical candidate given her comorbidities. -Initially didnot want surgery at this time and understands the risks with and without surgery. -She does not want to be intubated or have CPR and has opted for DNR.  -cefepime, flagylwere discontinued when patient was transitioned to comfort care -12/05/17--pt and family changed their minds and wanted to pursue dialysis and continue abx -12/05/17--reconsult nephrology and general surgery -fullliquids per general surgery -repeat CT abd if WBC continues to rise -12/07/17--discussed with Dr. 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 -baseline creatinine 2.5-2.9 -?ATNvs hemodynamic changes from pneumoperitoneum -Nephrology consulted and appreciated-->plan for RRT if family agrees -patient placed on lasix drip along with bicarb dripper renal -Foley catheter placed for rentention and diuresis -Continue to monitor BMP -pt more somnolent, in part due to uremia -12/06/17--VasCath  placed, HD initiated -12/06/17--pt wanted to stop HD after 90 min -12/07/17 pt now agreeable to be dialyzed again, but pt stopped HD again after 22 minutes  PersistentAtrial fibrillation  -CHADSVASC 3 (gender, CHF,HTN) -Heart rate in the ED was up to 130s. Patient was placed on diltiazem drip.  -drip was stopped when pt was comfort care -HR remained controlled with drip off -Eliquis currenlty held  -start IV heparin  Chronic diastolic heart failure -BNP 328 (reviewed chart, this BNP is much lower than previous) -Suspect BNP elevated given CKD stage IV -Does not appear to be volume overloaded at this time -wasinitiallyplaced on gentle IVF given acute illness  -continue to follow intake/output, daily weights -10/8 Echo--EF 65-70%, no WMA, PASP 48 -lasix dripstopped previously due transition to comfort care-->restart until HD initiated -temporary dialysis catheter was place on 12/06/17 and her first dialysis was initiated on 12/06/17.  Anemia of chronic kidney disease -baselinehemoglobin currently8-9 -continue to monitor CBC -anemia panel obtained: Ironsat 5%, ferritin 62,folate 2.9 -FOBT pending -discussed IV iron with nephrology-->dosing per renal  Essential hypertension -previouslystableoncardizem drip -now BPs soft off meds with intermittent hypotension, particularly on HD  Chronic leg ulcers -continue wound care with 1/4 Dakins  Elevated troponin -No complaints of chest pain -Given that patient has CKD4, troponin not very reliable -trop flat 0.05-0.06-0.05 -12/03/17 Echo--EF 65-70%, no WMA, trivial TR, PASP 48  Left renal mass/ Left adnexal lesion -incidentally noted on CT abd/pelvis -Patient will need outpatient follow   Goals of care -she does not wish for intubation or CPR and code status has been changed to DNR -Palliative care consulted for [place] and continued to follow the patient -Case discussed with [facility] Course  (including significant findings, care, treatment, and services provided and events leading to death)  [name] is a 47 y.o. year old female  with medical history significant foratrial fibrillation on Eliquis, chronic diastolic CHF, hypertension, CKDIV,and chronic anemia, now presenting to the emergency department for evaluation of acute upper abdominal pain. Patient reports that she developed acute onset of pain in the right upper quadrant this morning with radiation towards the right shoulder. Pain is rated as severe, constant, with no alleviating or exacerbating factors identified, and without vomiting, diarrhea, fevers, or chills. She denies any chest pain or palpitations and denies any change in her chronic dyspnea. She has never experienced these symptoms previously. Denies melena or hematochezia  Interim history Patient admitted for pneumoperitoneum. General surgery consulted, no surgery at this time. Plan treat medically.During the hospitalization, the patient developed worsening acute on chronic renal failure with metabolic acidosis.General surgery discussed patient's prognosis with her and family at bedside, patient understands that surgery is not an option at this time. She is now a DNR. Nephrology consulted for worsening renal failure. Palliative care consulted for [facility].Initially, after family meetings, patient and family did not want HD and the patient was transitioned to comfort care. 24 hours later, the patient and family changed their minds and wished to pursue dialysis. Nephrology and general surgery were reconsulted. A temporary dialysis catheter was place on 12/06/17 and her first dialysis was initiated on 12/06/17.  However, patient terminated dialysis after 90 minutes.  After further discussion with the patient and family, the patient was agreeable to reinitiate dialysis on 12/07/2017.  However, after 22 minutes of dialysis on 12/07/2017, the patient once again wanted  to discontinue dialysis.  I had multiple discussions with the patient and family.  The patient's intradialytic hypotension limited ability to give hypnotic and opioid medications.  After multiple discussions with the patient's family and patient, he ultimately wanted to discontinue dialysis altogether and transition the patient's focus of care to focus that on full comfort.  Subsequently, medications with curative intent were discontinued and further care was focused on the patient's comfort.     Pertinent Labs and Studies  Significant Diagnostic Studies Ct Abdomen Pelvis Wo Contrast  Result Date: 12/14/2017 CLINICAL DATA:  Acute abdominal pain. EXAM: CT ABDOMEN AND PELVIS WITHOUT CONTRAST TECHNIQUE: Multidetector CT imaging of the abdomen and pelvis was performed following the standard protocol without IV contrast. COMPARISON:  CT scan January 02, 2012 FINDINGS: Lower chest: Small calcified granulomas in the lung bases. No other acute abnormalities in the lung bases. Cardiomegaly. Small pericardial effusion. Coronary artery calcifications. Hepatobiliary: The liver demonstrates a nodular contour suggesting cirrhosis. No liver masses noted. The gallbladder is mildly distended but otherwise normal. Right upper quadrant ultrasound from today demonstrated no gallbladder abnormalities. Pancreas: Unremarkable. No pancreatic ductal dilatation or surrounding inflammatory changes. Spleen: Normal in size without focal abnormality. Adrenals/Urinary Tract: The adrenal glands are normal. There appear to be 2 right renal cysts. I suspect a small cyst in the left kidney as well. An exophytic mass off the left kidney on series 2, image 38 demonstrates an attenuation of 33 Hounsfield units and this mass measures 10 mm. No hydronephrosis. A few small stones are seen in the right kidney. No ureterectasis or ureteral stone. The bladder is unremarkable. Stomach/Bowel: The stomach and small bowel is normal with no obstruction.  The colon is unremarkable. Visualized portions of the appendix are normal. Vascular/Lymphatic: Atherosclerotic changes are seen in the nonaneurysmal aorta. Shotty nodes are seen in the retroperitoneum and gastrohepatic ligament. No gross adenopathy. Reproductive: The left ovary is somewhat enlarged measuring 6.5 cm. A rounded region of decreased echogenicity within  the ovary demonstrates an attenuation of 40 Hounsfield units. The right ovary is normal in appearance. The uterus is unremarkable. Other: Free air seen in the abdomen with no identified cause. There is ascites as well, particularly adjacent to the liver and spleen, in the pericolic gutters, and in the pelvis. Musculoskeletal: No acute or significant osseous findings. IMPRESSION: 1. Free air in the abdomen suggests perforated viscus. An underlying cause is not seen. 2. Moderate ascites in the abdomen and pelvis. 3. Enlarged left ovary containing a 5 cm rounded hypoechoic region. The hypoechoic region could represent a hemorrhagic cyst but is nonspecific. Recommend pelvic ultrasound for further evaluation of the left ovary. 4. Cirrhotic liver. 5. 10 mm exophytic mass off the left kidney with an attenuation of 33 Hounsfield units. This could represent a solid mass or hyperdense cyst. An ultrasound may further characterize when the patient is able. 6. Nonobstructive renal stones. 7. Atherosclerotic changes in the nonaneurysmal aorta. Findings called to Dr. Ashok Cordia. Electronically Signed   By: Dorise Bullion III M.D   On: 12/04/2017 20:50   Dg Chest 1 View  Result Date: 12/07/2017 CLINICAL DATA:  Evaluate NG tube placement EXAM: CHEST  1 VIEW COMPARISON:  November 30, 2017 FINDINGS: The distal NG tube is not well seen due to poor penetration. However, it appears to terminate below today's study, within the abdomen. No other change. IMPRESSION: The distal NG tube is not well seen due to poor penetration. However, it appears to terminate in the abdomen, below  the bottom of today's study. Electronically Signed   By: Dorise Bullion III M.D   On: 11/29/2017 23:25   Dg Chest 2 View  Result Date: 12/01/2017 CLINICAL DATA:  Shortness of breath, RIGHT upper abdominal pain, RIGHT shoulder pain, awoke patient from sleep, history CHF, essential benign hypertension, atrial fibrillation, pulmonary hypertension EXAM: CHEST - 2 VIEW COMPARISON:  04/04/2017 FINDINGS: Enlargement of cardiac silhouette. Mediastinal contours and pulmonary vascularity normal. Mild RIGHT basilar atelectasis. Chronic accentuation of pulmonary markings, stable. No acute infiltrate, pleural effusion or pneumothorax. Bones unremarkable. IMPRESSION: Enlargement of cardiac silhouette with RIGHT basilar atelectasis. Electronically Signed   By: Lavonia Dana M.D.   On: 12/02/2017 17:31   US Renal  Result Date: 12/03/2017 CLINICAL DATA:  Acute renal failure EXAM: RENAL / URINARY TRACT ULTRASOUND COMPLETE COMPARISON:  06/30/2016 ultrasound, 12/06/2017 FINDINGS: Right Kidney: Length: 10.6 cm. 1.5 cm cyst is noted. This is similar to that seen on prior CT examination. The smaller lower pole cyst is not as well appreciated on today's exam. No significant hydronephrosis is noted. Diffuse increased echogenicity is noted consistent with the given clinical history. Left Kidney: Length: 11 cm. Not well visualized aside from size. Diffuse increased echogenicity is noted. Bladder: Appears normal for degree of bladder distention. Note is made of ascites. This is similar to that seen on prior CT examination. IMPRESSION: Increased echogenicity consistent with medical renal disease. Stable right renal cyst is noted. No other focal abnormality is seen. Electronically Signed   By: Inez Catalina M.D.   On: 12/03/2017 16:06   Dg Chest Port 1 View  Result Date: 12/02/2017 CLINICAL DATA:  Evaluate NG tube placement EXAM: PORTABLE CHEST 1 VIEW COMPARISON:  Portable chest x-ray of 12/01/2017 FINDINGS: The NG tube does course  below the hemidiaphragm into the stomach but the tip is not visualized. No active infiltrate or effusion is seen. The heart is mildly enlarged and stable. Right PICC line tip is noted to the upper SVC IMPRESSION:  1. NG tube tip extends into the stomach but the tip is not visualized. 2. Right PICC line tip overlies the upper SVC. Electronically Signed   By: Ivar Drape M.D.   On: 12/02/2017 17:18   Dg Chest Port 1 View  Result Date: 12/01/2017 CLINICAL DATA:  Post nasogastric tube placement EXAM: PORTABLE CHEST 1 VIEW COMPARISON:  Portable exam 1502 hours compared to 12/04/2017 FINDINGS: Nasogastric tube extends into stomach, tip not visualized. RIGHT arm PICC line tip projects over SVC. Enlargement of cardiac silhouette. Slight pulmonary vascular congestion. No gross acute infiltrate, pleural effusion or pneumothorax. IMPRESSION: Enlargement of cardiac silhouette. No acute abnormalities. Electronically Signed   By: Lavonia Dana M.D.   On: 12/01/2017 15:18   US Abdomen Limited Ruq  Result Date: 12/02/2017 CLINICAL DATA:  Right upper quadrant abdominal pain. Chronic kidney disease and hypertension. EXAM: ULTRASOUND ABDOMEN LIMITED RIGHT UPPER QUADRANT COMPARISON:  Renal ultrasound dated 06/30/2016 and abdomen and pelvis CT dated 01/02/2012 FINDINGS: Gallbladder: No gallstones or wall thickening visualized. No sonographic Murphy sign noted by sonographer. Common bile duct: Diameter: 4.1 mm Liver: Mildly irregular borders. Normal echotexture. Portal vein is patent on color Doppler imaging with normal direction of blood flow towards the liver. Also noted is a markedly echogenic right kidney with progressive increased echogenicity, containing a small cyst. IMPRESSION: 1. No acute abnormality. 2. Markedly echogenic right kidney with progression, compatible with medical renal disease. Electronically Signed   By: Claudie [place] M.D.   On: 12/04/2017 10:27    Microbiology Recent Results (from the past 240 hour(s))   MRSA PCR Screening     Status: None   Collection Time: 12/16/2017 11:21 PM  Result Value Ref Range Status   MRSA by PCR NEGATIVE NEGATIVE Final    Comment:        The GeneXpert MRSA Assay (FDA approved for NASAL specimens only), is one component of a comprehensive MRSA colonization surveillance program. It is not intended to diagnose MRSA infection nor to guide or monitor treatment for MRSA infections. Performed at [facility], [address], [place], [place] [address]   Culture, blood (Routine X 2) w Reflex to ID Panel     Status: None (Preliminary result)   Collection Time: 12/06/17  2:07 PM  Result Value Ref Range Status   Specimen Description   Final    BLOOD RIGHT FOREARM BOTTLES DRAWN AEROBIC AND ANAEROBIC   Special Requests Blood Culture adequate volume  Final   Culture   Final    NO GROWTH 2 DAYS Performed at [facility], [address], [place], [place] [address]    Report Status PENDING  Incomplete  Culture, blood (Routine X 2) w Reflex to ID Panel     Status: None (Preliminary result)   Collection Time: 12/06/17  2:07 PM  Result Value Ref Range Status   Specimen Description   Final    BLOOD RIGHT FOREARM BOTTLES DRAWN AEROBIC AND ANAEROBIC   Special Requests   Final    Blood Culture results may not be optimal due to an excessive volume of blood received in culture bottles   Culture   Final    NO GROWTH 2 DAYS Performed at [facility], [address] [place], [place] [address]    Report Status PENDING  Incomplete    Lab Basic Metabolic Panel: Recent Labs  Lab 12/03/17 0406 12/04/17 0436 12/05/17 1457 12/06/17 0349 12/07/17 0522  NA 136 133* 132* 132* 132*  K 5.1 4.3 4.6 4.5 5.0  CL 115* 108 106 104 103  CO2 11* 14* 14* 15* 13*  GLUCOSE 94 102* 97 104* 102*  BUN 72* 80* 83* 89* 80*  CREATININE 5.23* 5.77* 6.31* 6.48* 6.49*  CALCIUM 8.3* 8.1* 8.3* 8.3* 8.6*  PHOS 6.4* 5.4*  --  5.1* 5.6*   Liver Function Tests: Recent Labs  Lab 12/03/17 0406  12/04/17 0436 12/06/17 0349 12/07/17 0522  ALBUMIN 2.0* 1.8* 1.7* 1.8*   No results for input(s): LIPASE, AMYLASE in the last 168 hours. No results for input(s): AMMONIA in the last 168 hours. CBC: Recent Labs  Lab 12/03/17 0406 12/04/17 0436 12/05/17 1457 12/06/17 0349 12/07/17 0522  WBC 4.7 3.8* 6.7 9.4 25.6*  HGB 8.8* 7.9* 8.5* 8.7* 8.6*  HCT 28.1* 26.3* 28.3* 28.6* 28.0*  MCV 91.5 92.3 91.6 90.8 92.4  PLT 269 199 171 192 195   Cardiac Enzymes: No results for input(s): CKTOTAL, CKMB, CKMBINDEX, TROPONINI in the last 168 hours. Sepsis Labs: Recent Labs  Lab 12/01/17 0822  12/02/17 0414  12/04/17 0436 12/05/17 1457 12/06/17 0349 12/07/17 0522  WBC  --    < >  --    < > 3.8* 6.7 9.4 25.6*  LATICACIDVEN 2.0*  --  1.1  --   --   --   --   --    < > = values in this interval not displayed.    Procedures/Operations  Femoral dialysis catheter 12/06/17   Shanon Brow Mischell [place] 12-11-2017, 8:10 AM

## 2017-12-27 NOTE — Progress Notes (Signed)
50 cc dilaudid wasted in jug; witnessed by Bethann Humble RN

## 2017-12-27 NOTE — Progress Notes (Signed)
Was called to patients room at 0658 because patients family felt the patient had passed. There was no visible breathing noted, no apical pulse or radial pulse noted on auscultation. Death was called by two RNs. Dr. Carles Collet was notified of death. [facility], spoke with Tonye Becket. Patient is candidate for eye donation. Ref # [id number]

## 2017-12-27 DEATH — deceased

## 2020-07-02 IMAGING — DX DG ANKLE COMPLETE 3+V*R*
3 series · 3 of 3 positions shown · non-contrast
Comparison: 06/27/2017

CLINICAL DATA: Blunt trauma several months ago with increasing pain
and soft tissue wound

EXAM:
RIGHT ANKLE - COMPLETE 3+ VIEW

[ankle ap]
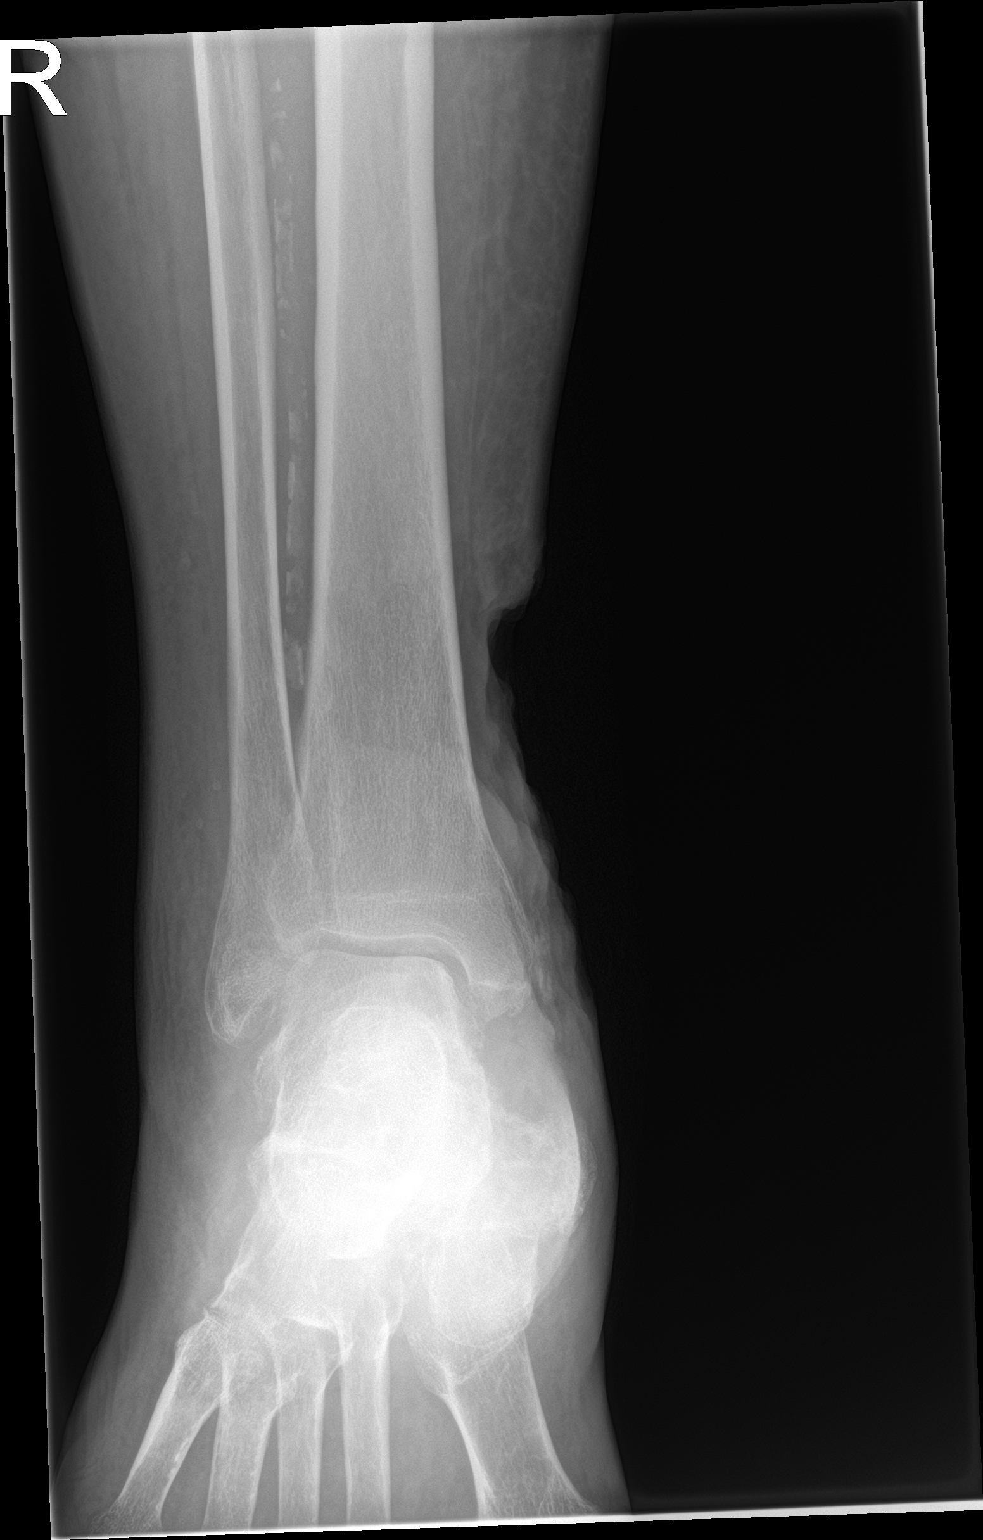

[ankle obl]
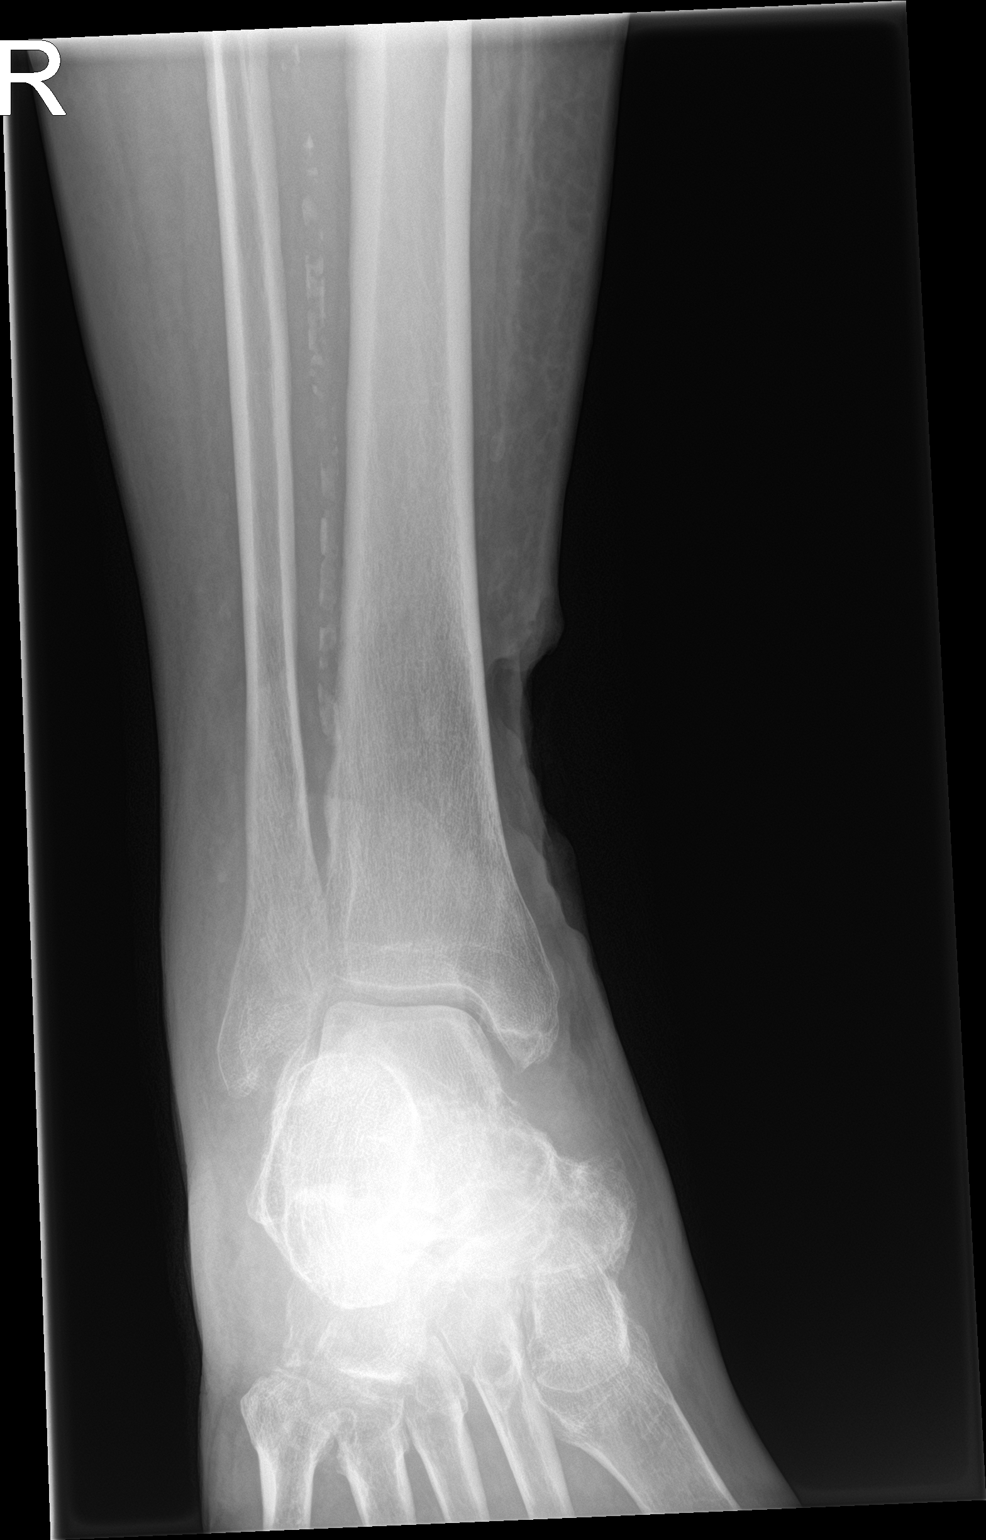

[ankle lat]
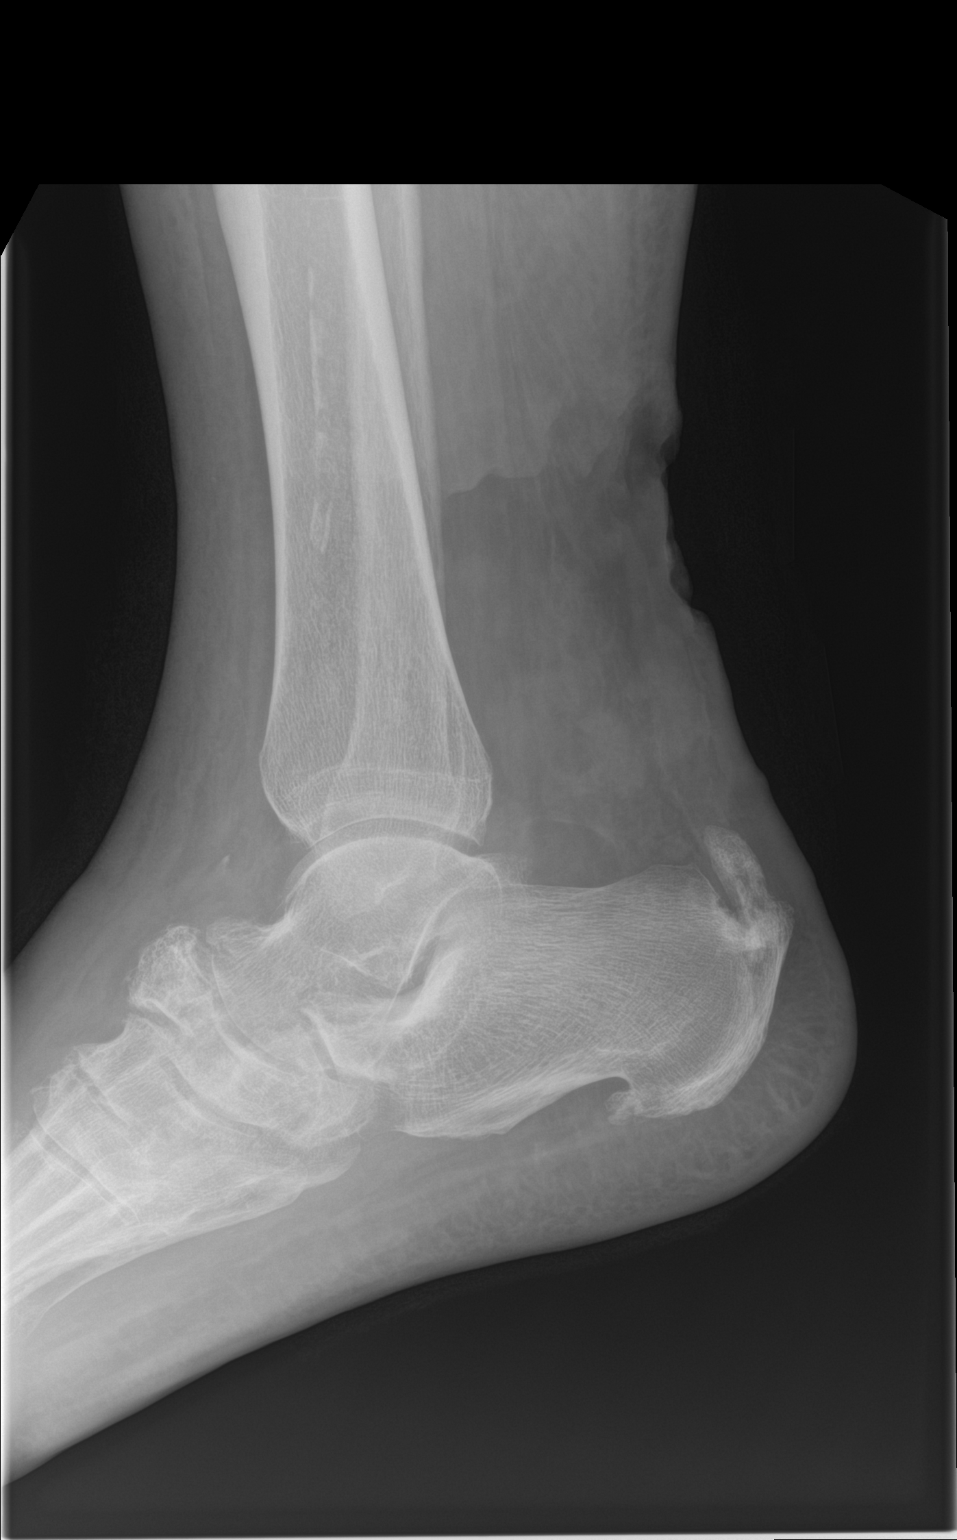

[3 of 3 positions shown; findings below may reference images not displayed]

FINDINGS: Increasing soft tissue wound is noted medially consistent with the
given clinical history. Chronic changes of the tarsal bones are
noted. Calcaneal spurring is noted. No definitive bony erosion is
identified to suggest osteomyelitis.
IMPRESSION: Enlarging soft tissue wound.

Chronic degenerative changes similar to that seen on the prior exam.
